# Patient Record
Sex: Male | Born: 1955 | Race: Black or African American | Hispanic: No | Smoking: Former smoker
Health system: Southern US, Community
[De-identification: ages and names within clinical notes are randomized; demographics above are authoritative.]

## PROBLEM LIST (undated history)

## (undated) DIAGNOSIS — I509 Heart failure, unspecified: Secondary | ICD-10-CM

## (undated) DIAGNOSIS — I712 Thoracic aortic aneurysm, without rupture, unspecified: Secondary | ICD-10-CM

## (undated) DIAGNOSIS — IMO0001 Reserved for inherently not codable concepts without codable children: Secondary | ICD-10-CM

## (undated) DIAGNOSIS — K5900 Constipation, unspecified: Secondary | ICD-10-CM

## (undated) DIAGNOSIS — F32A Depression, unspecified: Secondary | ICD-10-CM

## (undated) DIAGNOSIS — K219 Gastro-esophageal reflux disease without esophagitis: Secondary | ICD-10-CM

## (undated) DIAGNOSIS — F419 Anxiety disorder, unspecified: Secondary | ICD-10-CM

## (undated) DIAGNOSIS — N189 Chronic kidney disease, unspecified: Secondary | ICD-10-CM

## (undated) DIAGNOSIS — S68119A Complete traumatic metacarpophalangeal amputation of unspecified finger, initial encounter: Secondary | ICD-10-CM

## (undated) DIAGNOSIS — I5032 Chronic diastolic (congestive) heart failure: Secondary | ICD-10-CM

## (undated) DIAGNOSIS — Z9289 Personal history of other medical treatment: Secondary | ICD-10-CM

## (undated) DIAGNOSIS — I2510 Atherosclerotic heart disease of native coronary artery without angina pectoris: Secondary | ICD-10-CM

## (undated) DIAGNOSIS — Q381 Ankyloglossia: Secondary | ICD-10-CM

## (undated) DIAGNOSIS — Z8673 Personal history of transient ischemic attack (TIA), and cerebral infarction without residual deficits: Secondary | ICD-10-CM

## (undated) DIAGNOSIS — I219 Acute myocardial infarction, unspecified: Secondary | ICD-10-CM

## (undated) DIAGNOSIS — I10 Essential (primary) hypertension: Secondary | ICD-10-CM

## (undated) DIAGNOSIS — F329 Major depressive disorder, single episode, unspecified: Secondary | ICD-10-CM

## (undated) DIAGNOSIS — N4340 Spermatocele of epididymis, unspecified: Secondary | ICD-10-CM

## (undated) DIAGNOSIS — E785 Hyperlipidemia, unspecified: Secondary | ICD-10-CM

## (undated) DIAGNOSIS — Z87442 Personal history of urinary calculi: Secondary | ICD-10-CM

## (undated) DIAGNOSIS — N289 Disorder of kidney and ureter, unspecified: Secondary | ICD-10-CM

## (undated) HISTORY — DX: Thoracic aortic aneurysm, without rupture: I71.2

## (undated) HISTORY — DX: Chronic diastolic (congestive) heart failure: I50.32

## (undated) HISTORY — DX: Atherosclerotic heart disease of native coronary artery without angina pectoris: I25.10

## (undated) HISTORY — DX: Thoracic aortic aneurysm, without rupture, unspecified: I71.20

## (undated) HISTORY — PX: OTHER SURGICAL HISTORY: SHX169

## (undated) HISTORY — DX: Chronic kidney disease, unspecified: N18.9

## (undated) HISTORY — DX: Hyperlipidemia, unspecified: E78.5

## (undated) HISTORY — DX: Personal history of transient ischemic attack (TIA), and cerebral infarction without residual deficits: Z86.73

---

## 1989-07-14 HISTORY — PX: SPERMATOCELECTOMY: SHX2420

## 1998-06-10 ENCOUNTER — Ambulatory Visit (HOSPITAL_COMMUNITY): Admission: RE | Admit: 1998-06-10 | Discharge: 1998-06-10 | Payer: Self-pay | Admitting: Hematology and Oncology

## 2002-07-03 ENCOUNTER — Ambulatory Visit (HOSPITAL_BASED_OUTPATIENT_CLINIC_OR_DEPARTMENT_OTHER): Admission: RE | Admit: 2002-07-03 | Discharge: 2002-07-03 | Payer: Self-pay | Admitting: Urology

## 2011-07-15 DIAGNOSIS — I219 Acute myocardial infarction, unspecified: Secondary | ICD-10-CM | POA: Insufficient documentation

## 2011-08-08 ENCOUNTER — Emergency Department (HOSPITAL_COMMUNITY)
Admission: EM | Admit: 2011-08-08 | Discharge: 2011-08-08 | Disposition: A | Discharge status: Home / Self Care | Payer: Self-pay | Attending: Emergency Medicine | Admitting: Emergency Medicine

## 2011-08-08 ENCOUNTER — Other Ambulatory Visit: Payer: Self-pay

## 2011-08-08 ENCOUNTER — Emergency Department (HOSPITAL_COMMUNITY): Payer: Self-pay

## 2011-08-08 DIAGNOSIS — I4460 Unspecified fascicular block: Secondary | ICD-10-CM | POA: Insufficient documentation

## 2011-08-08 DIAGNOSIS — Z87442 Personal history of urinary calculi: Secondary | ICD-10-CM | POA: Insufficient documentation

## 2011-08-08 DIAGNOSIS — E876 Hypokalemia: Secondary | ICD-10-CM | POA: Insufficient documentation

## 2011-08-08 DIAGNOSIS — I110 Hypertensive heart disease with heart failure: Secondary | ICD-10-CM

## 2011-08-08 DIAGNOSIS — I10 Essential (primary) hypertension: Secondary | ICD-10-CM | POA: Insufficient documentation

## 2011-08-08 DIAGNOSIS — I509 Heart failure, unspecified: Secondary | ICD-10-CM | POA: Insufficient documentation

## 2011-08-08 DIAGNOSIS — I517 Cardiomegaly: Secondary | ICD-10-CM | POA: Insufficient documentation

## 2011-08-08 HISTORY — DX: Essential (primary) hypertension: I10

## 2011-08-08 LAB — CBC
HCT: 45.2 % (ref 39.0–52.0)
Hemoglobin: 15.6 g/dL (ref 13.0–17.0)
MCH: 30.3 pg (ref 26.0–34.0)
MCHC: 34.5 g/dL (ref 30.0–36.0)
MCV: 87.8 fL (ref 78.0–100.0)
Platelets: 315 10*3/uL (ref 150–400)
RBC: 5.15 MIL/uL (ref 4.22–5.81)
RDW: 13.5 % (ref 11.5–15.5)
WBC: 7.8 10*3/uL (ref 4.0–10.5)

## 2011-08-08 LAB — BASIC METABOLIC PANEL
BUN: 15 mg/dL (ref 6–23)
CO2: 26 mEq/L (ref 19–32)
Calcium: 8.1 mg/dL — ABNORMAL LOW (ref 8.4–10.5)
Chloride: 100 mEq/L (ref 96–112)
Creatinine, Ser: 1.91 mg/dL — ABNORMAL HIGH (ref 0.50–1.35)
GFR calc Af Amer: 44 mL/min — ABNORMAL LOW (ref >60)
GFR calc non Af Amer: 37 mL/min — ABNORMAL LOW (ref >60)
Glucose, Bld: 235 mg/dL — ABNORMAL HIGH (ref 70–99)
Potassium: 2.6 mEq/L — CL (ref 3.5–5.1)
Sodium: 138 mEq/L (ref 135–145)

## 2011-08-08 LAB — TROPONIN I: Troponin I: <0.3 ng/mL (ref <0.30)

## 2011-08-08 LAB — PRO B NATRIURETIC PEPTIDE: Pro B Natriuretic peptide (BNP): 2197 pg/mL — ABNORMAL HIGH (ref 0–125)

## 2011-08-08 MED ORDER — NITROGLYCERIN 2 % TD OINT
1.0000 [in_us] | TOPICAL_OINTMENT | Freq: Once | TRANSDERMAL | Status: AC
  Administered 2011-08-08: 66.6667 [in_us] via TOPICAL
  Filled 2011-08-08: qty 1

## 2011-08-08 MED ORDER — LORAZEPAM 2 MG/ML IJ SOLN
1.0000 mg | Freq: Once | INTRAMUSCULAR | Status: AC
  Administered 2011-08-08: 1 mg via INTRAVENOUS
  Filled 2011-08-08: qty 1

## 2011-08-08 MED ORDER — POTASSIUM CHLORIDE 20 MEQ/15ML (10%) PO LIQD
40.0000 meq | Freq: Once | ORAL | Status: AC
  Administered 2011-08-08: 40 meq via ORAL
  Filled 2011-08-08: qty 30

## 2011-08-08 MED ORDER — IPRATROPIUM BROMIDE 0.02 % IN SOLN
0.5000 mg | Freq: Once | RESPIRATORY_TRACT | Status: AC
Start: 2011-08-08 — End: 2011-08-08
  Administered 2011-08-08: 0.5 mg via RESPIRATORY_TRACT
  Filled 2011-08-08: qty 2.5

## 2011-08-08 MED ORDER — ALBUTEROL SULFATE (5 MG/ML) 0.5% IN NEBU
2.5000 mg | INHALATION_SOLUTION | Freq: Once | RESPIRATORY_TRACT | Status: AC
  Administered 2011-08-08: 2.5 mg via RESPIRATORY_TRACT
  Filled 2011-08-08: qty 1

## 2011-08-08 MED ORDER — FUROSEMIDE 10 MG/ML IJ SOLN
40.0000 mg | Freq: Once | INTRAMUSCULAR | Status: AC
  Administered 2011-08-08: 40 mg via INTRAVENOUS
  Filled 2011-08-08: qty 4

## 2011-08-08 MED ORDER — FUROSEMIDE 40 MG PO TABS: 40.0000 mg | ORAL_TABLET | Freq: Every day | ORAL | Status: DC

## 2011-08-08 NOTE — ED Notes (Signed)
CRITICAL VALUE ALERT  Critical value received:  K-2.6  Date of notification:  08/08/11  Time of notification:  0853  Critical value read back:yes  Nurse who received alert:  t abbott rn  MD notified (1st page):  [phone]  Time of first page:  0853  MD notified (2nd page):  Time of second page:  Responding MD:  edp davidson  Time MD responded: [phone]

## 2011-08-08 NOTE — ED Notes (Signed)
Pt was given a cup of water.  °

## 2011-08-08 NOTE — ED Notes (Signed)
Pt states he got into an argument with his boss. States he was upset when he became sob. Pt vomiting on arrival to ed

## 2011-08-08 NOTE — ED Provider Notes (Signed)
History   Chart scribed for Carleene Cooper III, MD by Enos Fling; the patient was seen in room APA06/APA06; this patient's care was started at 8:18 AM.    CSN: [id number] Arrival date & time: 08/08/2011  8:04 AM  Chief Complaint  Patient presents with  . Shortness of Breath    HPI Matthew Cain is a 55 y.o. male brought in by ambulance, who presents to the Emergency Department complaining of shortness of breath. Pt c/o sob, sudden in onset approx 1 hour ago when pt in argument with his boss. Pt c/o associated central chest pain, nausea, vomiting, productive cough, and throat tightness. Sx unchanged. Pt unsure if he received a breathing treatment en route. Pt h/o HTN and former smoker. Denies h/o MI or DM.   Past Medical History  Diagnosis Date  . Hypertension   Kidney stones  History reviewed. No pertinent past surgical history.  History reviewed. No pertinent family history.  History  Substance Use Topics  . Smoking status: Not on file  . Smokeless tobacco: Not on file  . Alcohol Use: No  "very little" alcohol use; former smoker   Review of Systems 10 Systems reviewed and are negative for acute change except as noted in the HPI.   Allergies  Review of patient's allergies indicates no known allergies.  Home Medications   Current Outpatient Rx  Name Route Sig Dispense Refill  . CITALOPRAM HYDROBROMIDE 20 MG PO TABS Oral Take 10 mg by mouth every morning.      [name] PAPAYA AND ENZYMES PO CHEW Oral Chew 4 tablets by mouth daily before supper.      . IBUPROFEN 200 MG PO TABS Oral Take 400 mg by mouth every 6 (six) hours as needed. pain     . SOTALOL HCL 80 MG PO TABS Oral Take 80 mg by mouth 2 (two) times daily.      . FUROSEMIDE 40 MG PO TABS Oral Take 1 tablet (40 mg total) by mouth daily. 30 tablet 0    Physical Exam    BP 151/96  Pulse 79  Temp(Src) 98.3 F (36.8 C) (Oral)  Resp 20  Ht 5\' 11"  (1.803 m)  Wt 240 lb (108.863 kg)  BMI 33.47 kg/m2  SpO2  94%  Physical Exam  Nursing note and vitals reviewed. Constitutional: He is oriented to person, place, and time. He appears well-developed and well-nourished. No distress.  HENT:  Head: Normocephalic.  Right Ear: Tympanic membrane normal.  Left Ear: Tympanic membrane normal.  Nose: Nose normal.  Mouth/Throat: Uvula is midline, oropharynx is clear and moist and mucous membranes are normal. No uvula swelling. No posterior oropharyngeal edema or posterior oropharyngeal erythema.  Eyes: Conjunctivae are normal.  Neck: Normal range of motion. Neck supple. No JVD present. No tracheal deviation present.  Cardiovascular: Normal rate, regular rhythm and intact distal pulses.  Exam reveals no gallop and no friction rub.   No murmur heard. Pulmonary/Chest: Effort normal. He has wheezes. He has no rales.  Abdominal: Soft. There is no tenderness.  Musculoskeletal: Normal range of motion.  Lymphadenopathy:    He has no cervical adenopathy.  Neurological: He is alert and oriented to person, place, and time.  Skin: Skin is warm and dry. No rash noted.  Psychiatric: He has a normal mood and affect.    ED Course  Procedures - none   OTHER DATA REVIEWED: Nursing notes and vital signs reviewed. Prior records reviewed.   LABS / RADIOLOGY: Results for orders placed  during the hospital encounter of 08/08/11  CBC      Component Value Range   WBC 7.8  4.0 - 10.5 (K/uL)   RBC 5.15  4.22 - 5.81 (MIL/uL)   Hemoglobin 15.6  13.0 - 17.0 (g/dL)   HCT 04.5  40.9 - 81.1 (%)   MCV 87.8  78.0 - 100.0 (fL)   MCH 30.3  26.0 - 34.0 (pg)   MCHC 34.5  30.0 - 36.0 (g/dL)   RDW 91.4  78.2 - 95.6 (%)   Platelets 315  150 - 400 (K/uL)  BASIC METABOLIC PANEL      Component Value Range   Sodium 138  135 - 145 (mEq/L)   Potassium 2.6 (*) 3.5 - 5.1 (mEq/L)   Chloride 100  96 - 112 (mEq/L)   CO2 26  19 - 32 (mEq/L)   Glucose, Bld 235 (*) 70 - 99 (mg/dL)   BUN 15  6 - 23 (mg/dL)   Creatinine, Ser 2.13 (*) 0.50  - 1.35 (mg/dL)   Calcium 8.1 (*) 8.4 - 10.5 (mg/dL)   GFR calc non Af Amer 37 (*) >60 (mL/min)   GFR calc Af Amer 44 (*) >60 (mL/min)  TROPONIN I      Component Value Range   Troponin I <0.30  <0.30 (ng/mL)  PRO B NATRIURETIC PEPTIDE      Component Value Range   BNP, POC 2197.0 (*) 0 - 125 (pg/mL)    Dg Chest Portable 1 View  08/08/2011  *RADIOLOGY REPORT*  Clinical Data: Shortness of breath, cough, former smoking history  PORTABLE CHEST - 1 VIEW  Comparison: None.  Findings: There is indistinctness of the pulmonary vascularity most consistent with moderate pulmonary vascular congestion. Pneumonia is difficult to exclude and follow up is recommended.  There is cardiomegaly present.  No definite effusion is seen.  No bony abnormality is noted.  IMPRESSION: Cardiomegaly and probable pulmonary vascular congestion.  Difficult to exclude an inflammatory process.  Original Report Authenticated By: Juline Patch, M.D.     ED COURSE:   Date: 08/08/2011  Rate: 95  Rhythm: normal sinus rhythm  QRS Axis: left  Intervals: normal  ST/T Wave abnormalities: nonspecific ST/T changes  Conduction Disutrbances: L anterior fascicular block. QRS:  LVH  Narrative Interpretation: Abnormal EKG with LVH.  Old EKG Reviewed: none available  9:18 AM Informed by nurse of hypokalemia, 40 mEq potassium ordered  9:50 AM Pt reevaluated, sob improved. All results reviewed and discussed with pt, questions answered, pt agreeable with plan.  10:30 AM Pt had CHF on chest x-ray.  He is breathing better now, after IV Ativan and an albuterol/atrovent nebulizer Rx.  Will give Lasix 40 mg IV and 1 inch topical nitroglycerin to treat his CHF. BNP pending.  12:45 PM Breathing much better.  Has had significant diureses.  Will order orthostatic VS, to see if pt will be able to go home.  2:35 PM  Breathing still improved. Pt comfortable with discharge.  IMPRESSION: 1. Congestive heart failure due to high blood  pressure   2. Hypertension      MEDS GIVEN IN ED: LORazepam (ATIVAN) injection 1 mg (1 mg Intravenous Given 08/08/11 0845)  albuterol (PROVENTIL) (5 MG/ML) 0.5% nebulizer solution 2.5 mg (2.5 mg Nebulization Given 08/08/11 0919)  ipratropium (ATROVENT) 0.02 % nebulizer solution 0.5 mg (0.5 mg Nebulization Given 08/08/11 0919)  potassium chloride 20 MEQ/15ML (10%) liquid 40 mEq (40 mEq Oral Given 08/08/11 0925)  furosemide (LASIX) 10 MG/ML injection 40  mg (40 mg Intravenous Given 08/08/11 1001)  nitroGLYCERIN (NITROGLYN) 2 % ointment 1 inch (16.1096 inch Topical Given 08/08/11 1000)     DISCHARGE MEDICATIONS: New Prescriptions   FUROSEMIDE (LASIX) 40 MG TABLET    Take 1 tablet (40 mg total) by mouth daily.     SCRIBE ATTESTATION: I personally performed the services described in this documentation, which was scribed in my presence. The recorded information has been reviewed and considered.  Osvaldo Human, M.D.           Carleene Cooper III, MD 08/08/11 [phone]

## 2011-09-08 ENCOUNTER — Emergency Department (HOSPITAL_COMMUNITY): Payer: Medicaid Other

## 2011-09-08 ENCOUNTER — Inpatient Hospital Stay (HOSPITAL_COMMUNITY): Payer: Medicaid Other

## 2011-09-08 ENCOUNTER — Inpatient Hospital Stay (HOSPITAL_COMMUNITY)
Admission: EM | Admit: 2011-09-08 | Discharge: 2011-09-19 | DRG: 208 | Disposition: A | Discharge status: Skilled Nursing Facility | Payer: Medicaid Other | Attending: Cardiovascular Disease | Admitting: Cardiovascular Disease

## 2011-09-08 DIAGNOSIS — J189 Pneumonia, unspecified organism: Secondary | ICD-10-CM

## 2011-09-08 DIAGNOSIS — N179 Acute kidney failure, unspecified: Secondary | ICD-10-CM | POA: Diagnosis not present

## 2011-09-08 DIAGNOSIS — R9431 Abnormal electrocardiogram [ECG] [EKG]: Secondary | ICD-10-CM | POA: Diagnosis present

## 2011-09-08 DIAGNOSIS — R5383 Other fatigue: Secondary | ICD-10-CM | POA: Diagnosis present

## 2011-09-08 DIAGNOSIS — J9600 Acute respiratory failure, unspecified whether with hypoxia or hypercapnia: Principal | ICD-10-CM | POA: Diagnosis present

## 2011-09-08 DIAGNOSIS — R5381 Other malaise: Secondary | ICD-10-CM | POA: Diagnosis present

## 2011-09-08 DIAGNOSIS — M5030 Other cervical disc degeneration, unspecified cervical region: Secondary | ICD-10-CM | POA: Diagnosis present

## 2011-09-08 DIAGNOSIS — R4701 Aphasia: Secondary | ICD-10-CM | POA: Diagnosis not present

## 2011-09-08 DIAGNOSIS — I672 Cerebral atherosclerosis: Secondary | ICD-10-CM | POA: Diagnosis present

## 2011-09-08 DIAGNOSIS — I6350 Cerebral infarction due to unspecified occlusion or stenosis of unspecified cerebral artery: Secondary | ICD-10-CM | POA: Diagnosis not present

## 2011-09-08 DIAGNOSIS — I639 Cerebral infarction, unspecified: Secondary | ICD-10-CM | POA: Diagnosis present

## 2011-09-08 DIAGNOSIS — Z7982 Long term (current) use of aspirin: Secondary | ICD-10-CM

## 2011-09-08 DIAGNOSIS — E872 Acidosis, unspecified: Secondary | ICD-10-CM | POA: Diagnosis present

## 2011-09-08 DIAGNOSIS — I517 Cardiomegaly: Secondary | ICD-10-CM

## 2011-09-08 DIAGNOSIS — I5033 Acute on chronic diastolic (congestive) heart failure: Secondary | ICD-10-CM | POA: Diagnosis present

## 2011-09-08 DIAGNOSIS — E876 Hypokalemia: Secondary | ICD-10-CM | POA: Diagnosis present

## 2011-09-08 DIAGNOSIS — N189 Chronic kidney disease, unspecified: Secondary | ICD-10-CM | POA: Diagnosis present

## 2011-09-08 DIAGNOSIS — R0602 Shortness of breath: Secondary | ICD-10-CM

## 2011-09-08 DIAGNOSIS — I129 Hypertensive chronic kidney disease with stage 1 through stage 4 chronic kidney disease, or unspecified chronic kidney disease: Secondary | ICD-10-CM | POA: Diagnosis present

## 2011-09-08 DIAGNOSIS — N289 Disorder of kidney and ureter, unspecified: Secondary | ICD-10-CM | POA: Diagnosis present

## 2011-09-08 DIAGNOSIS — E782 Mixed hyperlipidemia: Secondary | ICD-10-CM

## 2011-09-08 DIAGNOSIS — I2510 Atherosclerotic heart disease of native coronary artery without angina pectoris: Secondary | ICD-10-CM | POA: Diagnosis present

## 2011-09-08 DIAGNOSIS — I10 Essential (primary) hypertension: Secondary | ICD-10-CM | POA: Diagnosis present

## 2011-09-08 DIAGNOSIS — E785 Hyperlipidemia, unspecified: Secondary | ICD-10-CM | POA: Diagnosis present

## 2011-09-08 DIAGNOSIS — E119 Type 2 diabetes mellitus without complications: Secondary | ICD-10-CM | POA: Diagnosis present

## 2011-09-08 LAB — CARDIAC PANEL(CRET KIN+CKTOT+MB+TROPI)
CK, MB: 5.1 ng/mL — ABNORMAL HIGH (ref 0.3–4.0)
Relative Index: 2.1 (ref 0.0–2.5)
Total CK: 246 U/L — ABNORMAL HIGH (ref 7–232)
Troponin I: 0.37 ng/mL (ref <0.30)

## 2011-09-08 LAB — POCT I-STAT 3, ART BLOOD GAS (G3+)
Acid-Base Excess: 2 mmol/L (ref 0.0–2.0)
Bicarbonate: 29.5 mEq/L — ABNORMAL HIGH (ref 20.0–24.0)
O2 Saturation: 98 %
TCO2: 31 mmol/L (ref 0–100)
pCO2 arterial: 55.2 mmHg — ABNORMAL HIGH (ref 35.0–45.0)
pH, Arterial: 7.335 — ABNORMAL LOW (ref 7.350–7.450)
pO2, Arterial: 115 mmHg — ABNORMAL HIGH (ref 80.0–100.0)

## 2011-09-08 LAB — GLUCOSE, CAPILLARY
Glucose-Capillary: 182 mg/dL — ABNORMAL HIGH (ref 70–99)
Glucose-Capillary: 171 mg/dL — ABNORMAL HIGH (ref 70–99)

## 2011-09-08 LAB — MRSA PCR SCREENING: MRSA by PCR: NEGATIVE

## 2011-09-08 MED ORDER — IOHEXOL 350 MG/ML SOLN
50.0000 mL | Freq: Once | INTRAVENOUS | Status: AC | PRN
  Administered 2011-09-08: 50 mL via INTRAVENOUS

## 2011-09-09 ENCOUNTER — Inpatient Hospital Stay (HOSPITAL_COMMUNITY): Payer: Medicaid Other

## 2011-09-09 DIAGNOSIS — I5033 Acute on chronic diastolic (congestive) heart failure: Secondary | ICD-10-CM

## 2011-09-09 DIAGNOSIS — J9600 Acute respiratory failure, unspecified whether with hypoxia or hypercapnia: Secondary | ICD-10-CM

## 2011-09-09 DIAGNOSIS — Z9911 Dependence on respirator [ventilator] status: Secondary | ICD-10-CM

## 2011-09-09 DIAGNOSIS — I5031 Acute diastolic (congestive) heart failure: Secondary | ICD-10-CM

## 2011-09-09 HISTORY — PX: CARDIAC CATHETERIZATION: SHX172

## 2011-09-09 LAB — TSH: TSH: 1.117 u[IU]/mL (ref 0.350–4.500)

## 2011-09-09 LAB — LIPID PANEL
Cholesterol: 214 mg/dL — ABNORMAL HIGH (ref 0–200)
HDL: 45 mg/dL (ref >39)
LDL Cholesterol: 125 mg/dL — ABNORMAL HIGH (ref 0–99)
Total CHOL/HDL Ratio: 4.8 RATIO
Triglycerides: 222 mg/dL — ABNORMAL HIGH (ref <150)
VLDL: 44 mg/dL — ABNORMAL HIGH (ref 0–40)

## 2011-09-09 LAB — BASIC METABOLIC PANEL
BUN: 22 mg/dL (ref 6–23)
BUN: 24 mg/dL — ABNORMAL HIGH (ref 6–23)
CO2: 28 mEq/L (ref 19–32)
CO2: 29 mEq/L (ref 19–32)
Calcium: 8.1 mg/dL — ABNORMAL LOW (ref 8.4–10.5)
Calcium: 8.1 mg/dL — ABNORMAL LOW (ref 8.4–10.5)
Chloride: 101 mEq/L (ref 96–112)
Chloride: 103 mEq/L (ref 96–112)
Creatinine, Ser: 1.98 mg/dL — ABNORMAL HIGH (ref 0.50–1.35)
Creatinine, Ser: 1.86 mg/dL — ABNORMAL HIGH (ref 0.50–1.35)
GFR calc Af Amer: 42 mL/min — ABNORMAL LOW (ref >90)
GFR calc Af Amer: 45 mL/min — ABNORMAL LOW (ref >90)
GFR calc non Af Amer: 36 mL/min — ABNORMAL LOW (ref >90)
GFR calc non Af Amer: 39 mL/min — ABNORMAL LOW (ref >90)
Glucose, Bld: 98 mg/dL (ref 70–99)
Glucose, Bld: 126 mg/dL — ABNORMAL HIGH (ref 70–99)
Potassium: 2.7 mEq/L — CL (ref 3.5–5.1)
Potassium: 3.4 mEq/L — ABNORMAL LOW (ref 3.5–5.1)
Sodium: 142 mEq/L (ref 135–145)
Sodium: 141 mEq/L (ref 135–145)

## 2011-09-09 LAB — BLOOD GAS, ARTERIAL
Acid-Base Excess: 5.7 mmol/L — ABNORMAL HIGH (ref 0.0–2.0)
Bicarbonate: 29.4 mEq/L — ABNORMAL HIGH (ref 20.0–24.0)
Drawn by: 318431
FIO2: 0.4 %
MECHVT: 600 mL
O2 Saturation: 95 %
PEEP: 5 cmH2O
Patient temperature: 98.6
RATE: 14 resp/min
TCO2: 30.6 mmol/L (ref 0–100)
pCO2 arterial: 40.8 mmHg (ref 35.0–45.0)
pH, Arterial: 7.471 — ABNORMAL HIGH (ref 7.350–7.450)
pO2, Arterial: 68.5 mmHg — ABNORMAL LOW (ref 80.0–100.0)

## 2011-09-09 LAB — HEMOGLOBIN A1C
Hgb A1c MFr Bld: 7.3 % — ABNORMAL HIGH (ref <5.7)
Mean Plasma Glucose: 163 mg/dL — ABNORMAL HIGH (ref <117)

## 2011-09-09 LAB — CARDIAC PANEL(CRET KIN+CKTOT+MB+TROPI)
CK, MB: 4.8 ng/mL — ABNORMAL HIGH (ref 0.3–4.0)
CK, MB: 3.7 ng/mL (ref 0.3–4.0)
Relative Index: 2.6 — ABNORMAL HIGH (ref 0.0–2.5)
Relative Index: 2.2 (ref 0.0–2.5)
Total CK: 183 U/L (ref 7–232)
Total CK: 169 U/L (ref 7–232)
Troponin I: 0.35 ng/mL (ref <0.30)
Troponin I: <0.3 ng/mL (ref <0.30)

## 2011-09-09 LAB — GLUCOSE, CAPILLARY
Glucose-Capillary: 113 mg/dL — ABNORMAL HIGH (ref 70–99)
Glucose-Capillary: 120 mg/dL — ABNORMAL HIGH (ref 70–99)
Glucose-Capillary: 134 mg/dL — ABNORMAL HIGH (ref 70–99)
Glucose-Capillary: 158 mg/dL — ABNORMAL HIGH (ref 70–99)
Glucose-Capillary: 201 mg/dL — ABNORMAL HIGH (ref 70–99)

## 2011-09-09 LAB — CBC
HCT: 42.6 % (ref 39.0–52.0)
Hemoglobin: 14.7 g/dL (ref 13.0–17.0)
MCH: 30.1 pg (ref 26.0–34.0)
MCHC: 34.5 g/dL (ref 30.0–36.0)
MCV: 87.1 fL (ref 78.0–100.0)
Platelets: 242 10*3/uL (ref 150–400)
RBC: 4.89 MIL/uL (ref 4.22–5.81)
RDW: 13.5 % (ref 11.5–15.5)
WBC: 9.7 10*3/uL (ref 4.0–10.5)

## 2011-09-09 LAB — MAGNESIUM: Magnesium: 1.9 mg/dL (ref 1.5–2.5)

## 2011-09-10 ENCOUNTER — Other Ambulatory Visit (HOSPITAL_COMMUNITY): Payer: Self-pay

## 2011-09-10 ENCOUNTER — Inpatient Hospital Stay (HOSPITAL_COMMUNITY): Payer: Medicaid Other

## 2011-09-10 DIAGNOSIS — I10 Essential (primary) hypertension: Secondary | ICD-10-CM

## 2011-09-10 LAB — URINALYSIS, ROUTINE W REFLEX MICROSCOPIC
Bilirubin Urine: NEGATIVE
Glucose, UA: NEGATIVE mg/dL
Ketones, ur: NEGATIVE mg/dL
Leukocytes, UA: NEGATIVE
Nitrite: NEGATIVE
Protein, ur: NEGATIVE mg/dL
Specific Gravity, Urine: 1.008 (ref 1.005–1.030)
Urobilinogen, UA: 0.2 mg/dL (ref 0.0–1.0)
pH: 7 (ref 5.0–8.0)

## 2011-09-10 LAB — BASIC METABOLIC PANEL
BUN: 20 mg/dL (ref 6–23)
BUN: 17 mg/dL (ref 6–23)
CO2: 27 mEq/L (ref 19–32)
CO2: 28 mEq/L (ref 19–32)
Calcium: 8.3 mg/dL — ABNORMAL LOW (ref 8.4–10.5)
Calcium: 8.7 mg/dL (ref 8.4–10.5)
Chloride: 102 mEq/L (ref 96–112)
Chloride: 104 mEq/L (ref 96–112)
Creatinine, Ser: 1.52 mg/dL — ABNORMAL HIGH (ref 0.50–1.35)
Creatinine, Ser: 1.55 mg/dL — ABNORMAL HIGH (ref 0.50–1.35)
GFR calc Af Amer: 58 mL/min — ABNORMAL LOW (ref >90)
GFR calc Af Amer: 57 mL/min — ABNORMAL LOW (ref >90)
GFR calc non Af Amer: 50 mL/min — ABNORMAL LOW (ref >90)
GFR calc non Af Amer: 49 mL/min — ABNORMAL LOW (ref >90)
Glucose, Bld: 194 mg/dL — ABNORMAL HIGH (ref 70–99)
Glucose, Bld: 159 mg/dL — ABNORMAL HIGH (ref 70–99)
Potassium: 2.6 mEq/L — CL (ref 3.5–5.1)
Potassium: 3.5 mEq/L (ref 3.5–5.1)
Sodium: 138 mEq/L (ref 135–145)
Sodium: 140 mEq/L (ref 135–145)

## 2011-09-10 LAB — DRUGS OF ABUSE SCREEN W/O ALC, ROUTINE URINE
Amphetamine Screen, Ur: NEGATIVE
Barbiturate Quant, Ur: NEGATIVE
Benzodiazepines.: POSITIVE — AB
Cocaine Metabolites: NEGATIVE
Creatinine,U: 66.8 mg/dL
Marijuana Metabolite: NEGATIVE
Methadone: NEGATIVE
Opiate Screen, Urine: NEGATIVE
Phencyclidine (PCP): NEGATIVE
Propoxyphene: NEGATIVE

## 2011-09-10 LAB — CBC
HCT: 39.1 % (ref 39.0–52.0)
Hemoglobin: 13.6 g/dL (ref 13.0–17.0)
MCH: 30.6 pg (ref 26.0–34.0)
MCHC: 34.8 g/dL (ref 30.0–36.0)
MCV: 87.9 fL (ref 78.0–100.0)
Platelets: 217 10*3/uL (ref 150–400)
RBC: 4.45 MIL/uL (ref 4.22–5.81)
RDW: 13.5 % (ref 11.5–15.5)
WBC: 8.4 10*3/uL (ref 4.0–10.5)

## 2011-09-10 LAB — GLUCOSE, CAPILLARY
Glucose-Capillary: 134 mg/dL — ABNORMAL HIGH (ref 70–99)
Glucose-Capillary: 144 mg/dL — ABNORMAL HIGH (ref 70–99)
Glucose-Capillary: 110 mg/dL — ABNORMAL HIGH (ref 70–99)
Glucose-Capillary: 181 mg/dL — ABNORMAL HIGH (ref 70–99)

## 2011-09-10 LAB — MAGNESIUM: Magnesium: 2 mg/dL (ref 1.5–2.5)

## 2011-09-10 LAB — URINE MICROSCOPIC-ADD ON

## 2011-09-11 LAB — CULTURE, RESPIRATORY W GRAM STAIN

## 2011-09-11 LAB — GLUCOSE, CAPILLARY
Glucose-Capillary: 135 mg/dL — ABNORMAL HIGH (ref 70–99)
Glucose-Capillary: 124 mg/dL — ABNORMAL HIGH (ref 70–99)
Glucose-Capillary: 136 mg/dL — ABNORMAL HIGH (ref 70–99)
Glucose-Capillary: 132 mg/dL — ABNORMAL HIGH (ref 70–99)

## 2011-09-11 LAB — CULTURE, RESPIRATORY

## 2011-09-11 LAB — BASIC METABOLIC PANEL
BUN: 14 mg/dL (ref 6–23)
CO2: 24 mEq/L (ref 19–32)
Calcium: 8.8 mg/dL (ref 8.4–10.5)
Chloride: 104 mEq/L (ref 96–112)
Creatinine, Ser: 1.3 mg/dL (ref 0.50–1.35)
GFR calc Af Amer: 70 mL/min — ABNORMAL LOW (ref >90)
GFR calc non Af Amer: 60 mL/min — ABNORMAL LOW (ref >90)
Glucose, Bld: 142 mg/dL — ABNORMAL HIGH (ref 70–99)
Potassium: 3.7 mEq/L (ref 3.5–5.1)
Sodium: 137 mEq/L (ref 135–145)

## 2011-09-11 LAB — CREATININE, URINE, 24 HOUR
Collection Interval-UCRE24: 24 hours
Creatinine, 24H Ur: 1546 mg/d (ref 800–2000)
Creatinine, Urine: 118.95 mg/dL
Urine Total Volume-UCRE24: 1300 mL

## 2011-09-11 LAB — MAGNESIUM: Magnesium: 2 mg/dL (ref 1.5–2.5)

## 2011-09-11 NOTE — Cardiovascular Report (Signed)
Matthew Cain, Matthew Cain                   ACCOUNT NO.:  [id number]  MEDICAL RECORD NO.:  [id number]  LOCATION:  2903                         FACILITY:  [facility]  PHYSICIAN:  Verne Carrow, MDDATE OF BIRTH:  [birth year]  DATE OF PROCEDURE:  09/08/2011 DATE OF DISCHARGE:                           CARDIAC CATHETERIZATION   PRIMARY CARDIOLOGIST:  Learta Codding, MD, [facility]  PROCEDURES PERFORMED: 1. Left heart catheterization. 2. Selective coronary angiography.  OPERATOR:  Verne Carrow, MD  INDICATION:  This is a 55 year old African American male with a past medical history significant for hypertension, chronic renal insufficiency and reported congestive heart failure in the chart who presented to the [facility] Emergency Department via EMS earlier this afternoon with complaints of shortness of breath.  The patient was found to be in pulmonary edema and was quickly intubated secondary to respiratory distress.  The patient was evaluated by Dr. Lewayne Bunting in the Emergency Department at [facility] in [place], [place]. A bedside echocardiogram demonstrated hypokinesis of the anterior [place]. The patient's EKG shows chronic changes of left ventricular hypertrophy with possibly subtle changes in the precordial leads.  The patient also had reported possible weakness on the left side prior to intubation. There was also reported the patient had unequal pulses in his arms.  A Code STEMI was called and the patient was transported emergently to [facility].  Upon arrival, we elected to take the patient through our CT scanner in the emergency department to rule out an intracranial hemorrhage given his possible neurological changes at [facility].  The CT of the head showed no acute bleeding process intracranially.  We also used a low contrast load to perform a quick angiogram via CT of the aortic arch.  There was no evidence of aortic dissection.  At this point, we rapidly  transported the patient to the Cath Lab where an emergent left heart catheterizations was planned.  DETAILS OF PROCEDURE:  The patient was brought into the Cardiac Catheterizations Laboratory.  Emergency consent was obtained.  The patient was quickly moved to the Cath Table in a supine position.  The right groin was prepped and draped in sterile fashion.  Lidocaine 1% was used for local anesthesia.  A 6-French sheath was inserted into the right femoral artery without difficulty.  Standard diagnostic catheters were used to perform selective coronary angiography.  We elected not to perform the left ventricular angiogram as the patient does have chronic renal insufficiency.  I do not feel that an aortic angiogram was indicated as we had performed this via CT angiogram before he came to the Cath Lab.  The patient was intubated during the procedure and hemodynamically stable.  At the conclusion of the case, there were no immediate complications.  The patient remained sedated and intubated. The patient was taken to the CCU in stable condition.  HEMODYNAMIC FINDINGS:  Central aortic pressure 122/76.  Left ventricular pressure 119/5/13.  ANGIOGRAPHIC FINDINGS: 1. The left main coronary artery had no evidence of disease. 2. Left anterior descending was a large vessel that coursed to the     apex and had mild plaque disease in the proximal and midportion.  There were 2 moderate-sized diagonal branches that had mild plaque     disease. 3. The circumflex artery is a large dominant vessel.  It gives off 2     obtuse marginal branches, they are free of disease.  The circumflex     system has mild plaque disease in the midportion.  The left     posterolateral branch has mild plaque disease. 4. The right coronary artery is a small nondominant vessel with no     evidence of disease. 5. No left ventricular angiogram was performed.  IMPRESSION: 1. Mild nonobstructive coronary artery disease. 2.  Acute respiratory failure requiring mechanical ventilation presumed     to be secondary to pulmonary edema. 3. Questionable hypertensive heart disease based on the patient's EKG.  RECOMMENDATIONS:  The patient will be transferred to the CCU.  We will obtain a stat echocardiogram to assess his left ventricular size and function.  We will place a consultation to Pulmonary and Critical Care Medicine for assistance in ventilator management.  We will continue with gentle diuresis now following the cath and then diuresis over the next several days.  We will obtain a portable chest x-ray to exclude interstitial lung disease and pneumonia.     Verne Carrow, MD     CM/MEDQ  D:  09/08/2011  T:  09/09/2011  Job:  [id number]  cc:   Learta Codding, MD,FACC  Electronically Signed by Verne Carrow MD on 09/11/2011 08:30:57 AM

## 2011-09-12 LAB — BASIC METABOLIC PANEL
BUN: 14 mg/dL (ref 6–23)
CO2: 25 mEq/L (ref 19–32)
Calcium: 9 mg/dL (ref 8.4–10.5)
Chloride: 103 mEq/L (ref 96–112)
Creatinine, Ser: 1.23 mg/dL (ref 0.50–1.35)
GFR calc Af Amer: 75 mL/min — ABNORMAL LOW (ref >90)
GFR calc non Af Amer: 64 mL/min — ABNORMAL LOW (ref >90)
Glucose, Bld: 96 mg/dL (ref 70–99)
Potassium: 3.3 mEq/L — ABNORMAL LOW (ref 3.5–5.1)
Sodium: 138 mEq/L (ref 135–145)

## 2011-09-12 LAB — GLUCOSE, CAPILLARY
Glucose-Capillary: 120 mg/dL — ABNORMAL HIGH (ref 70–99)
Glucose-Capillary: 96 mg/dL (ref 70–99)
Glucose-Capillary: 124 mg/dL — ABNORMAL HIGH (ref 70–99)
Glucose-Capillary: 144 mg/dL — ABNORMAL HIGH (ref 70–99)
Glucose-Capillary: 97 mg/dL (ref 70–99)

## 2011-09-12 LAB — LIPID PANEL
Cholesterol: 231 mg/dL — ABNORMAL HIGH (ref 0–200)
HDL: 47 mg/dL (ref >39)
LDL Cholesterol: 129 mg/dL — ABNORMAL HIGH (ref 0–99)
Total CHOL/HDL Ratio: 4.9 RATIO
Triglycerides: 276 mg/dL — ABNORMAL HIGH (ref <150)
VLDL: 55 mg/dL — ABNORMAL HIGH (ref 0–40)

## 2011-09-12 LAB — HEMOGLOBIN A1C
Hgb A1c MFr Bld: 7.1 % — ABNORMAL HIGH (ref <5.7)
Mean Plasma Glucose: 157 mg/dL — ABNORMAL HIGH (ref <117)

## 2011-09-13 ENCOUNTER — Inpatient Hospital Stay (HOSPITAL_COMMUNITY): Payer: Medicaid Other

## 2011-09-13 DIAGNOSIS — I6350 Cerebral infarction due to unspecified occlusion or stenosis of unspecified cerebral artery: Secondary | ICD-10-CM

## 2011-09-13 LAB — BENZODIAZEPINE, QUANTITATIVE, URINE
Alprazolam (GC/LC/MS), ur confirm: NEGATIVE ng/mL
Flurazepam GC/MS Conf: NEGATIVE ng/mL
Lorazepam UR QT: NEGATIVE ng/mL
Nordiazepam GC/MS Conf: NEGATIVE ng/mL
Oxazepam GC/MS Conf: NEGATIVE ng/mL
Temazepam GC/MS Conf: NEGATIVE ng/mL

## 2011-09-13 LAB — BASIC METABOLIC PANEL WITH GFR
BUN: 16 mg/dL (ref 6–23)
CO2: 24 meq/L (ref 19–32)
Calcium: 9 mg/dL (ref 8.4–10.5)
Chloride: 105 meq/L (ref 96–112)
Creatinine, Ser: 1.29 mg/dL (ref 0.50–1.35)
GFR calc Af Amer: 71 mL/min — ABNORMAL LOW (ref >90)
GFR calc non Af Amer: 61 mL/min — ABNORMAL LOW (ref >90)
Glucose, Bld: 121 mg/dL — ABNORMAL HIGH (ref 70–99)
Potassium: 3.8 meq/L (ref 3.5–5.1)
Sodium: 138 meq/L (ref 135–145)

## 2011-09-13 LAB — GLUCOSE, CAPILLARY
Glucose-Capillary: 128 mg/dL — ABNORMAL HIGH (ref 70–99)
Glucose-Capillary: 163 mg/dL — ABNORMAL HIGH (ref 70–99)
Glucose-Capillary: 111 mg/dL — ABNORMAL HIGH (ref 70–99)
Glucose-Capillary: 130 mg/dL — ABNORMAL HIGH (ref 70–99)

## 2011-09-13 LAB — MAGNESIUM: Magnesium: 2 mg/dL (ref 1.5–2.5)

## 2011-09-14 DIAGNOSIS — I10 Essential (primary) hypertension: Secondary | ICD-10-CM

## 2011-09-14 DIAGNOSIS — G8110 Spastic hemiplegia affecting unspecified side: Secondary | ICD-10-CM

## 2011-09-14 DIAGNOSIS — I6330 Cerebral infarction due to thrombosis of unspecified cerebral artery: Secondary | ICD-10-CM

## 2011-09-14 DIAGNOSIS — I6789 Other cerebrovascular disease: Secondary | ICD-10-CM

## 2011-09-14 LAB — CBC
HCT: 37.7 % — ABNORMAL LOW (ref 39.0–52.0)
Hemoglobin: 12.2 g/dL — ABNORMAL LOW (ref 13.0–17.0)
MCH: 29 pg (ref 26.0–34.0)
MCHC: 32.4 g/dL (ref 30.0–36.0)
MCV: 89.8 fL (ref 78.0–100.0)
Platelets: 233 10*3/uL (ref 150–400)
RBC: 4.2 MIL/uL — ABNORMAL LOW (ref 4.22–5.81)
RDW: 14.2 % (ref 11.5–15.5)
WBC: 5.6 10*3/uL (ref 4.0–10.5)

## 2011-09-14 LAB — CATECHOLAMINES, FRACTIONATED, URINE, 24 HOUR
Catecholamines T: 79 mcg/24 h (ref 26–121)
Creatinine, Urine mg/day-CATEUR: 1.67 g/(24.h) (ref 0.63–2.50)
Dopamine 24 Hr Urine: 154 mcg/24 h (ref 52–480)
Epinephrine 24 Hr Urine: 8 mcg/24 h (ref 2–24)
Norepinephrine 24 Hr Urine: 71 mcg/24 h (ref 15–100)
Total urine volume: 1300 mL

## 2011-09-14 LAB — BASIC METABOLIC PANEL
BUN: 18 mg/dL (ref 6–23)
CO2: 24 mEq/L (ref 19–32)
Calcium: 9.1 mg/dL (ref 8.4–10.5)
Chloride: 105 mEq/L (ref 96–112)
Creatinine, Ser: 1.32 mg/dL (ref 0.50–1.35)
GFR calc Af Amer: 69 mL/min — ABNORMAL LOW (ref >90)
GFR calc non Af Amer: 59 mL/min — ABNORMAL LOW (ref >90)
Glucose, Bld: 117 mg/dL — ABNORMAL HIGH (ref 70–99)
Potassium: 4.3 mEq/L (ref 3.5–5.1)
Sodium: 138 mEq/L (ref 135–145)

## 2011-09-14 LAB — GLUCOSE, CAPILLARY
Glucose-Capillary: 114 mg/dL — ABNORMAL HIGH (ref 70–99)
Glucose-Capillary: 73 mg/dL (ref 70–99)
Glucose-Capillary: 95 mg/dL (ref 70–99)
Glucose-Capillary: 99 mg/dL (ref 70–99)

## 2011-09-15 LAB — GLUCOSE, CAPILLARY
Glucose-Capillary: 112 mg/dL — ABNORMAL HIGH (ref 70–99)
Glucose-Capillary: 95 mg/dL (ref 70–99)
Glucose-Capillary: 107 mg/dL — ABNORMAL HIGH (ref 70–99)
Glucose-Capillary: 115 mg/dL — ABNORMAL HIGH (ref 70–99)

## 2011-09-15 LAB — BASIC METABOLIC PANEL
BUN: 19 mg/dL (ref 6–23)
CO2: 24 mEq/L (ref 19–32)
Calcium: 9.2 mg/dL (ref 8.4–10.5)
Chloride: 101 mEq/L (ref 96–112)
Creatinine, Ser: 1.34 mg/dL (ref 0.50–1.35)
GFR calc Af Amer: 67 mL/min — ABNORMAL LOW (ref >90)
GFR calc non Af Amer: 58 mL/min — ABNORMAL LOW (ref >90)
Glucose, Bld: 112 mg/dL — ABNORMAL HIGH (ref 70–99)
Potassium: 4.2 mEq/L (ref 3.5–5.1)
Sodium: 137 mEq/L (ref 135–145)

## 2011-09-15 LAB — CULTURE, BLOOD (ROUTINE X 2)
Culture  Setup Time: 201210270126
Culture  Setup Time: 201210270126
Culture: NO GROWTH
Culture: NO GROWTH

## 2011-09-15 LAB — METANEPHRINES, URINE, 24 HOUR
Metaneph Total, Ur: 597 mcg/24 h (ref 224–832)
Metanephrines, Ur: 148 mcg/24 h (ref 90–315)
Normetanephrine, 24H Ur: 449 mcg/24 h (ref 122–676)
Volume, Urine-METAN: 1300 mL

## 2011-09-15 LAB — PRO B NATRIURETIC PEPTIDE: Pro B Natriuretic peptide (BNP): 505.6 pg/mL — ABNORMAL HIGH (ref 0–125)

## 2011-09-16 LAB — BASIC METABOLIC PANEL
BUN: 19 mg/dL (ref 6–23)
CO2: 23 mEq/L (ref 19–32)
Calcium: 8.6 mg/dL (ref 8.4–10.5)
Chloride: 101 mEq/L (ref 96–112)
Creatinine, Ser: 1.36 mg/dL — ABNORMAL HIGH (ref 0.50–1.35)
GFR calc Af Amer: 66 mL/min — ABNORMAL LOW (ref >90)
GFR calc non Af Amer: 57 mL/min — ABNORMAL LOW (ref >90)
Glucose, Bld: 217 mg/dL — ABNORMAL HIGH (ref 70–99)
Potassium: 3.7 mEq/L (ref 3.5–5.1)
Sodium: 136 mEq/L (ref 135–145)

## 2011-09-16 LAB — GLUCOSE, CAPILLARY
Glucose-Capillary: 228 mg/dL — ABNORMAL HIGH (ref 70–99)
Glucose-Capillary: 75 mg/dL (ref 70–99)
Glucose-Capillary: 101 mg/dL — ABNORMAL HIGH (ref 70–99)
Glucose-Capillary: 97 mg/dL (ref 70–99)

## 2011-09-16 MED ORDER — HYDRALAZINE HCL 20 MG/ML IJ SOLN
20.0000 mg | INTRAMUSCULAR | Status: DC | PRN
  Filled 2011-09-16: qty 1

## 2011-09-16 MED ORDER — INSULIN ASPART 100 UNIT/ML ~~LOC~~ SOLN
3.0000 [IU] | Freq: Three times a day (TID) | SUBCUTANEOUS | Status: DC
  Administered 2011-09-18: 3 [IU] via SUBCUTANEOUS
  Filled 2011-09-16: qty 3

## 2011-09-16 MED ORDER — PANTOPRAZOLE SODIUM 40 MG PO TBEC
40.0000 mg | DELAYED_RELEASE_TABLET | Freq: Every day | ORAL | Status: DC
  Administered 2011-09-17 – 2011-09-19 (×3): 40 mg via ORAL

## 2011-09-16 MED ORDER — ACETAMINOPHEN 325 MG PO TABS: 650.0000 mg | ORAL_TABLET | Freq: Four times a day (QID) | ORAL | Status: DC | PRN

## 2011-09-16 MED ORDER — POTASSIUM CHLORIDE CRYS ER 20 MEQ PO TBCR: 20.0000 meq | EXTENDED_RELEASE_TABLET | Freq: Every day | ORAL | Status: DC

## 2011-09-16 MED ORDER — LABETALOL HCL 300 MG PO TABS
800.0000 mg | ORAL_TABLET | Freq: Two times a day (BID) | ORAL | Status: DC
  Administered 2011-09-16 – 2011-09-19 (×6): 800 mg via ORAL
  Filled 2011-09-16 (×8): qty 1

## 2011-09-16 MED ORDER — FUROSEMIDE 20 MG PO TABS
20.0000 mg | ORAL_TABLET | Freq: Every day | ORAL | Status: DC
  Administered 2011-09-17 – 2011-09-19 (×3): 20 mg via ORAL
  Filled 2011-09-16 (×4): qty 1

## 2011-09-16 MED ORDER — AMLODIPINE BESYLATE 10 MG PO TABS
10.0000 mg | ORAL_TABLET | Freq: Every day | ORAL | Status: DC
  Administered 2011-09-17 – 2011-09-19 (×3): 10 mg via ORAL
  Filled 2011-09-16 (×4): qty 1

## 2011-09-16 MED ORDER — INSULIN ASPART 100 UNIT/ML ~~LOC~~ SOLN
0.0000 [IU] | Freq: Every day | SUBCUTANEOUS | Status: DC
  Filled 2011-09-16: qty 3

## 2011-09-16 MED ORDER — INSULIN ASPART 100 UNIT/ML ~~LOC~~ SOLN
0.0000 [IU] | Freq: Three times a day (TID) | SUBCUTANEOUS | Status: DC
  Administered 2011-09-18: 2 [IU] via SUBCUTANEOUS
  Filled 2011-09-16: qty 3

## 2011-09-16 MED ORDER — NITROGLYCERIN IN D5W 200-5 MCG/ML-% IV SOLN: 2.0000 ug/min | INTRAVENOUS | Status: DC

## 2011-09-16 MED ORDER — TRAMADOL HCL 50 MG PO TABS: 50.0000 mg | ORAL_TABLET | Freq: Four times a day (QID) | ORAL | Status: DC | PRN

## 2011-09-16 MED ORDER — POTASSIUM CHLORIDE CRYS ER 20 MEQ PO TBCR
20.0000 meq | EXTENDED_RELEASE_TABLET | Freq: Every day | ORAL | Status: DC
  Administered 2011-09-17: 20 meq via ORAL

## 2011-09-16 MED ORDER — ROSUVASTATIN CALCIUM 5 MG PO TABS
5.0000 mg | ORAL_TABLET | Freq: Every day | ORAL | Status: DC
  Administered 2011-09-17 – 2011-09-18 (×2): 5 mg via ORAL
  Filled 2011-09-16 (×4): qty 1

## 2011-09-16 MED ORDER — POTASSIUM CHLORIDE CRYS ER 20 MEQ PO TBCR: 40.0000 meq | EXTENDED_RELEASE_TABLET | Freq: Every day | ORAL | Status: DC

## 2011-09-16 MED ORDER — CITALOPRAM HYDROBROMIDE 20 MG PO TABS
20.0000 mg | ORAL_TABLET | Freq: Every day | ORAL | Status: DC
  Administered 2011-09-17 – 2011-09-19 (×3): 20 mg via ORAL
  Filled 2011-09-16 (×4): qty 1

## 2011-09-16 MED ORDER — SPIRONOLACTONE 25 MG PO TABS
25.0000 mg | ORAL_TABLET | Freq: Every day | ORAL | Status: DC
  Administered 2011-09-17 – 2011-09-19 (×3): 25 mg via ORAL
  Filled 2011-09-16 (×4): qty 1

## 2011-09-17 DIAGNOSIS — N289 Disorder of kidney and ureter, unspecified: Secondary | ICD-10-CM | POA: Diagnosis present

## 2011-09-17 DIAGNOSIS — I639 Cerebral infarction, unspecified: Secondary | ICD-10-CM | POA: Diagnosis present

## 2011-09-17 DIAGNOSIS — I10 Essential (primary) hypertension: Secondary | ICD-10-CM | POA: Diagnosis present

## 2011-09-17 DIAGNOSIS — I2510 Atherosclerotic heart disease of native coronary artery without angina pectoris: Secondary | ICD-10-CM | POA: Diagnosis present

## 2011-09-17 DIAGNOSIS — E785 Hyperlipidemia, unspecified: Secondary | ICD-10-CM | POA: Diagnosis present

## 2011-09-17 DIAGNOSIS — I6350 Cerebral infarction due to unspecified occlusion or stenosis of unspecified cerebral artery: Secondary | ICD-10-CM | POA: Insufficient documentation

## 2011-09-17 DIAGNOSIS — N189 Chronic kidney disease, unspecified: Secondary | ICD-10-CM | POA: Diagnosis present

## 2011-09-17 DIAGNOSIS — J189 Pneumonia, unspecified organism: Secondary | ICD-10-CM | POA: Diagnosis present

## 2011-09-17 DIAGNOSIS — E119 Type 2 diabetes mellitus without complications: Secondary | ICD-10-CM | POA: Diagnosis present

## 2011-09-17 LAB — GLUCOSE, CAPILLARY
Glucose-Capillary: 98 mg/dL (ref 70–99)
Glucose-Capillary: 110 mg/dL — ABNORMAL HIGH (ref 70–99)
Glucose-Capillary: 99 mg/dL (ref 70–99)
Glucose-Capillary: 83 mg/dL (ref 70–99)

## 2011-09-17 LAB — BASIC METABOLIC PANEL
BUN: 16 mg/dL (ref 6–23)
CO2: 26 mEq/L (ref 19–32)
Calcium: 9.4 mg/dL (ref 8.4–10.5)
Chloride: 101 mEq/L (ref 96–112)
Creatinine, Ser: 1.41 mg/dL — ABNORMAL HIGH (ref 0.50–1.35)
GFR calc Af Amer: 63 mL/min — ABNORMAL LOW (ref >90)
GFR calc non Af Amer: 55 mL/min — ABNORMAL LOW (ref >90)
Glucose, Bld: 97 mg/dL (ref 70–99)
Potassium: 4 mEq/L (ref 3.5–5.1)
Sodium: 137 mEq/L (ref 135–145)

## 2011-09-17 LAB — 5 HIAA, QUANTITATIVE, URINE, 24 HOUR
5-HIAA, 24 Hr Urine: 5.5 mg/24 h (ref <=6.0)
Volume, Urine-5HIAA: 1300 mL/24 h

## 2011-09-17 MED ORDER — FUROSEMIDE 10 MG/ML IJ SOLN
20.0000 mg | Freq: Once | INTRAMUSCULAR | Status: AC
  Administered 2011-09-17: 20 mg via INTRAVENOUS
  Filled 2011-09-17: qty 2

## 2011-09-17 MED ORDER — LISINOPRIL 2.5 MG PO TABS
2.5000 mg | ORAL_TABLET | Freq: Every day | ORAL | Status: DC
  Administered 2011-09-17: 2.5 mg via ORAL
  Filled 2011-09-17 (×2): qty 1

## 2011-09-17 NOTE — Progress Notes (Signed)
  Subjective: Patient denis CP  Does note a little sob when lying in bed  Occasional wheeze Objective: Filed Vitals:   09/08/11 1531 09/14/11 0505 09/17/11 0513  BP:   160/83  Pulse: 61  69  Temp:   98.3 F (36.8 C)  Resp:   18  Height:  5\' 10"  (1.778 m)   Weight:  239 lb 10.2 oz (108.7 kg)   SpO2:   96%   Weight change:   Intake/Output Summary (Last 24 hours) at 09/17/11 1007 Last data filed at 09/17/11 0645  Gross per 24 hour  Intake    240 ml  Output    825 ml  Net   -585 ml    General: Alert, awake, oriented x3, in no acute distress.  HEENT: No bruits, no goiter.  Heart: Regular rate and rhythm, without murmurs, rubs, gallops.  Lungs: Crackles left side, bilateral air movement.  Abdomen: Soft, nontender, nondistended, positive bowel sounds.  Ext:  Triv edema   Lab Results: Results for orders placed during the hospital encounter of 09/08/11 (from the past 24 hour(s))  GLUCOSE, CAPILLARY     Status: Normal   Collection Time   09/16/11 11:19 AM      Component Value Range   Glucose-Capillary 75  70 - 99 (mg/dL)   Comment 1 Notify RN    GLUCOSE, CAPILLARY     Status: Abnormal   Collection Time   09/16/11  4:19 PM      Component Value Range   Glucose-Capillary 101 (*) 70 - 99 (mg/dL)   Comment 1 Notify RN    GLUCOSE, CAPILLARY     Status: Normal   Collection Time   09/16/11 11:03 PM      Component Value Range   Glucose-Capillary 97  70 - 99 (mg/dL)  GLUCOSE, CAPILLARY     Status: Normal   Collection Time   09/17/11  7:10 AM      Component Value Range   Glucose-Capillary 98  70 - 99 (mg/dL)    Studies/Results: No results found.  Medications: Reviewed  Active Problems:  CVA (cerebral infarction)  Recent CVA    Continues PT.  Will contact SW re SNF>  CAD (coronary artery disease), native coronary artery  MIld by cath.  HTN (hypertension)  BP is elevated  Add Lisinopril 2.5.  Continue other meds.  D/C KCL  CHeck BMET in AM.  WIll give Lasix IV x 1 today    Dyslipidemia  On Crestor now for better control of LDL  DM (diabetes mellitus)  CS have improved.  Renal insufficiency  Check BMET in AM  Pneumonia  Completed Rx     LOS: 9 days   Dietrich Pates 09/17/2011, 10:07 AM

## 2011-09-18 LAB — GLUCOSE, CAPILLARY
Glucose-Capillary: 134 mg/dL — ABNORMAL HIGH (ref 70–99)
Glucose-Capillary: 110 mg/dL — ABNORMAL HIGH (ref 70–99)
Glucose-Capillary: 91 mg/dL (ref 70–99)
Glucose-Capillary: 114 mg/dL — ABNORMAL HIGH (ref 70–99)

## 2011-09-18 MED ORDER — ASPIRIN EC 81 MG PO TBEC
81.0000 mg | DELAYED_RELEASE_TABLET | Freq: Every day | ORAL | Status: DC
  Administered 2011-09-18 – 2011-09-19 (×2): 81 mg via ORAL
  Filled 2011-09-18 (×2): qty 1

## 2011-09-18 MED ORDER — LISINOPRIL 5 MG PO TABS
5.0000 mg | ORAL_TABLET | Freq: Every day | ORAL | Status: DC
  Administered 2011-09-18 – 2011-09-19 (×2): 5 mg via ORAL
  Filled 2011-09-18 (×2): qty 1

## 2011-09-18 NOTE — Progress Notes (Signed)
Utilization review completed. Nuri Branca, RN, BSN. 09/18/11 

## 2011-09-18 NOTE — Progress Notes (Signed)
Plan for discharge to Skilled Nursing Facility. Social Worker is following. Tomi Bamberger, RN,BSN Care Manager. [phone]

## 2011-09-18 NOTE — Progress Notes (Signed)
  Evaluated for possible admission.  Please see rehab MD consult recommending inpatient rehab vs skilled nursing facility.  Patient is doing too well for our inpatient rehab program.  Agree with home with home health or skilled nursing facility.

## 2011-09-18 NOTE — Progress Notes (Signed)
SUBJECTIVE:BP still running high but some improvement.  Patient has no complaints this am.  Still has left-sided weakness.    Filed Vitals:   09/17/11 0513 09/17/11 1427 09/17/11 2151 09/18/11 0358  BP: 160/83 168/82 169/90 144/83  Pulse: 69 67 66 62  Temp: 98.3 F (36.8 C) 97.9 F (36.6 C) 97.8 F (36.6 C) 97.2 F (36.2 C)  TempSrc:  Oral Oral Oral  Resp: 18 18 20 18   Height:      Weight:    106.278 kg (234 lb 4.8 oz)  SpO2: 96% 93% 96% 95%    Intake/Output Summary (Last 24 hours) at 09/18/11 0804 Last data filed at 09/18/11 0356  Gross per 24 hour  Intake      0 ml  Output   1727 ml  Net  -1727 ml    LABS: Basic Metabolic Panel:  Basename 09/17/11 1208 09/16/11 0630  NA 137 136  K 4.0 3.7  CL 101 101  CO2 26 23  GLUCOSE 97 217*  BUN 16 19  CREATININE 1.41* 1.36*  CALCIUM 9.4 8.6  MG -- --  PHOS -- --   BNP:  Basename 09/15/11 1029  POCBNP 505.6*    PHYSICAL EXAM General: NAD Neck: No JVD, no thyromegaly or thyroid nodule.  Lungs: Clear to auscultation bilaterally with normal respiratory effort. CV: Nondisplaced PMI.  Heart regular S1/S2, +S4, no murmur.  No peripheral edema. Right carotid bruit.  Normal pedal pulses.  Abdomen: Soft, nontender, no hepatosplenomegaly, no distention.  Neurologic: Alert and oriented x 3.  Psych: Normal affect. Extremities: No clubbing or cyanosis.   TELEMETRY: Reviewed telemetry pt in sinus rhythm  ASSESSMENT AND PLAN:  55 yo with resistant HTN, diastolic CHF, and CVA .   1. CVA: Still with left-sided weakness.  Continue ASA and statin.  Ready for SNF discharge. 2. HTN: BP high but improving.  He is on amlodipine, labetalol, spironolactone, and low dose lisinopril.  Will increase lisinopril to 5 mg daily today.  He will need close outpatient followup for this.  BMET today.  3. Diastolic CHF: EF preserved on echo with severe LVH.  4. Dispo: Discharge to SNF today if bed is ready.  Continue current meds except increasing  lisinopril and adding ASA 81.  Needs to Followup with Dr. Earnestine Leys in [place] in 10 days with BMET in 1 week.   Maddie Brazier [employer]

## 2011-09-18 NOTE — Progress Notes (Signed)
Cardiac Rehab [phone] Completed CHF education with pt. Discussed daily weights, s&s of CHF,when to call MD and low sodium low carb diet. He voices understanding.

## 2011-09-18 NOTE — Progress Notes (Signed)
Physical Therapy Treatment Patient Details Name: Matthew Cain MRN: [id number] DOB: [birth year] Today's Date: 09/18/2011  PT Assessment/Plan   PT Goals  Acute Rehab PT Goals PT Goal Formulation: With patient Time For Goal Achievement: 2 weeks Pt will go Supine/Side to Sit: Independently PT Goal: Supine/Side to Sit - Progress: Other (comment) Pt will Transfer Sit to Stand/Stand to Sit: with modified independence PT Transfer Goal: Sit to Stand/Stand to Sit - Progress: Progressing toward goal Pt will Transfer Bed to Chair/Chair to Bed: with supervision PT Transfer Goal: Bed to Chair/Chair to Bed - Progress: Progressing toward goal Pt will Ambulate: >150 feet;with least restrictive assistive device;with supervision PT Goal: Ambulate - Progress: Progressing toward goal  PT Treatment Precautions/Restrictions  Precautions Precautions: Fall Precaution Comments: chair alarm Restrictions Weight Bearing Restrictions: No Mobility (including Balance) Bed Mobility Bed Mobility: No Transfers Transfers: Yes Sit to Stand: 5: Supervision;With armrests;From chair/3-in-1;From toilet Sit to Stand Details (indicate cue type and reason): worked on control for last 6 in of sit Ambulation/Gait Ambulation/Gait: Yes Ambulation/Gait Assistance: 5: Supervision Ambulation/Gait Assistance Details (indicate cue type and reason):  verbal cues for changing speed and changing pattern Ambulation Distance (Feet): 400 Feet Assistive device: None Gait Pattern: Left foot flat;Decreased stance time - right;Decreased step length - left;Decreased dorsiflexion - left;Decreased weight shift to left (with improved left heel strike, pt loses left knee control) Gait velocity: decreased pace Stairs: No Wheelchair Mobility Wheelchair Mobility: No  Posture/Postural Control Posture/Postural Control: Postural limitations Postural Limitations: pt with several minor LOB with self-correction Balance Balance Assessed:  Yes Static Standing Balance Single Leg Stance - Right Leg: 3  Exercise  General Exercises - Lower Extremity Mini-Sqauts: 10 reps;Strengthening;AROM;Standing;Limitations (difficulty keeping wt posterior) Mini Squats Limitations: tends to let knees migrate anteriorly, vc's to push hips posterior End of Session PT - End of Session Equipment Utilized During Treatment: Gait belt Activity Tolerance: Patient tolerated treatment well Patient left: in chair;with call bell in reach;with bed alarm set General Behavior During Session: [facility] for tasks performed Cognition: [facility] for tasks performed  Jasan Doughtie, [place] 09/18/2011, 1:37 PM

## 2011-09-18 NOTE — Progress Notes (Signed)
OT treatment session completed, however information not populating into progress note. Please see OT navigator for specifics about session. Continue to recommend ST-SNF for DC plan. Micahel Omlor, OTR/L 09/18/2011

## 2011-09-18 NOTE — Plan of Care (Signed)
Problem: Phase III Progression Outcomes Goal: Pt without medical complications Outcome: Progressing Improving L sided weakness. PT/OT involved. Will d/c to ST rehab

## 2011-09-19 ENCOUNTER — Telehealth: Payer: Self-pay | Admitting: Cardiovascular Disease

## 2011-09-19 ENCOUNTER — Encounter (HOSPITAL_COMMUNITY): Payer: Self-pay | Admitting: Cardiovascular Disease

## 2011-09-19 DIAGNOSIS — I279 Pulmonary heart disease, unspecified: Secondary | ICD-10-CM

## 2011-09-19 LAB — GLUCOSE, CAPILLARY
Glucose-Capillary: 99 mg/dL (ref 70–99)
Glucose-Capillary: 108 mg/dL — ABNORMAL HIGH (ref 70–99)
Glucose-Capillary: 76 mg/dL (ref 70–99)

## 2011-09-19 LAB — ALDOSTERONE: Aldosterone, Serum: 4 ng/dL

## 2011-09-19 MED ORDER — LABETALOL HCL 200 MG PO TABS: 800.0000 mg | ORAL_TABLET | Freq: Two times a day (BID) | ORAL | Status: DC

## 2011-09-19 MED ORDER — SPIRONOLACTONE 25 MG PO TABS: 25.0000 mg | ORAL_TABLET | Freq: Every day | ORAL | Status: DC

## 2011-09-19 MED ORDER — LISINOPRIL 5 MG PO TABS: 5.0000 mg | ORAL_TABLET | Freq: Every day | ORAL | Status: DC

## 2011-09-19 MED ORDER — ACETAMINOPHEN 325 MG PO TABS: 650.0000 mg | ORAL_TABLET | Freq: Four times a day (QID) | ORAL | Status: AC | PRN

## 2011-09-19 MED ORDER — ASPIRIN 81 MG PO TBEC: 81.0000 mg | DELAYED_RELEASE_TABLET | Freq: Every day | ORAL | Status: DC

## 2011-09-19 MED ORDER — FUROSEMIDE 20 MG PO TABS: 20.0000 mg | ORAL_TABLET | Freq: Every day | ORAL | Status: DC

## 2011-09-19 MED ORDER — ROSUVASTATIN CALCIUM 20 MG PO TABS: 20.0000 mg | ORAL_TABLET | Freq: Every day | ORAL | Status: DC

## 2011-09-19 MED ORDER — PANTOPRAZOLE SODIUM 40 MG PO TBEC: 40.0000 mg | DELAYED_RELEASE_TABLET | Freq: Every day | ORAL | Status: DC

## 2011-09-19 MED ORDER — AMLODIPINE BESYLATE 10 MG PO TABS: 10.0000 mg | ORAL_TABLET | Freq: Every day | ORAL | Status: DC

## 2011-09-19 NOTE — Discharge Summary (Signed)
Physician Discharge Summary  Patient ID: Matthew Cain MRN: [id number] DOB/AGE: 56-Aug-1957 56 y.o.  Admit date: 09/08/2011 Discharge date: 09/19/2011  Primary Discharge Diagnosis: Pulmonary edema  Secondary Discharge Diagnosis: VDRF, CVA, Acute diastolic CHF, HTN, DM, ARI, PNA Significant Diagnostic Studies: Cardiac catheterization, 2-D echocardiogram, MRI and MRA of the brain, renal ultrasound, chest x-ray  Consults: pulmonary/intensive care, neurology   Cain Course: Matthew Cain is a 55 year old male with no previous cardiac issues. He went to Matthew Cain with shortness of breath and was found to be in pulmonary edema. He also complained of some left-sided weakness. He was evaluated there by Dr. began and a bedside echocardiogram showed Matthew Cain motion abnormalities. He also had old changes of LVH and AS well as possible acute ischemic changes. A code STEMI  was called and he was transferred to [facility], and taken to the catheterization lab.  The cardiac catheterization showed mild nonobstructive coronary artery disease. Acute respiratory failure and in ventilation was felt secondary to pulmonary edema. The patient was transferred to Matthew Cain.  A pulmonary critical care consult was called to manage his ventilator. They followed him closely and he was diuresed. He was extubated on 09/09/2011. The critical care medicine team also managed his antibiotics. He was sedated while on the vent but then the sedation was discontinued. His blood sugars were managed with sliding scale insulin. He was also started on Protonix and heparinized.  Because of the weakness a CT of the head was performed which was negative. A CT of the abdomen and chest was also performed which showed a 4.4 cm thoracic ascending aortic aneurysm but no evidence for an abdominal aortic aneurysm. His echocardiogram showed an EF of 55% with severe LVH. He had pneumonia and was placed on antibiotics. The antibiotics were completed prior to  discharge. He was hypokalemic with diuresis and his creatinine increased to 1.98. Medications were adjusted and his creatinine improved. He still has mild renal insufficiency but this can be followed as an outpatient. As his condition improved he was seen by cardiac rehabilitation. Physical therapy and occupational therapy were recommended. A neurology consult was called. Aspirin was felt the most appropriate anticoagulation. A swallow screen was performed and he did well. Carotid Dopplers showed no evidence of ICA stenosis. His blood pressure medications were adjusted for better blood pressure control. He was seen by case management to discuss rehabilitation options. Short term skilled nursing facility for rehabilitation was recommended and the patient agreed. A bed search was done. [facility]. Matthew Cain is also provided nutrition education regarding diabetes. He was started on Crestor for better control of his LDL. Matthew Cain was anxious to get to rehabilitation in order to improve. On 09/19/2011 Matthew Cain felt that Matthew Cain was stable for rehabilitation, to followup as an outpatient.    Discharge Exam: Blood pressure 131/71, pulse 66, temperature 97.7 F (36.5 C), temperature source Oral, resp. rate 20, height 5\' 10"  (1.778 m), weight 234 lb 4.8 oz (106.278 kg), SpO2 94.00%.    Labs:   Lab Results  Component Value Date   WBC 5.6 09/14/2011   HGB 12.2* 09/14/2011   HCT 37.7* 09/14/2011   MCV 89.8 09/14/2011   PLT 233 09/14/2011    Lab 09/17/11 1208  NA 137  K 4.0  CL 101  CO2 26  BUN 16  CREATININE 1.41*  CALCIUM 9.4  PROT --  BILITOT --  ALKPHOS --  ALT --  AST --  GLUCOSE  97   Lab Results  Component Value Date   CKTOTAL 169 09/09/2011   CKMB 3.7 09/09/2011   TROPONINI <0.30 09/09/2011    Lab Results  Component Value Date   CHOL 231* 09/12/2011   CHOL 214* 09/09/2011   Lab Results  Component Value Date   HDL 47 09/12/2011   HDL 45  09/09/2011   Lab Results  Component Value Date   LDLCALC 129* 09/12/2011   LDLCALC 125* 09/09/2011   Lab Results  Component Value Date   TRIG 276* 09/12/2011   TRIG 222* 09/09/2011   Lab Results  Component Value Date   CHOLHDL 4.9 09/12/2011   CHOLHDL 4.8 09/09/2011   No results found for this basename: LDLDIRECT      Radiology: CXR - stable lung volumes EKG: SR, 76 bpm  FOLLOW UP PLANS AND APPOINTMENTS:  Matthew Cain requests followup in the Matthew Cain office while in the skilled nursing facility for rehabilitation. After he goes home to Matthew Cain we will change his followup to the Matthew Cain office.   He has a followup appointment with Matthew Cain on November 26 at 10:30 AM  Current Discharge Medication List    CONTINUE these medications which have NOT CHANGED   Details  citalopram (CELEXA) 20 MG tablet Take 20 mg by mouth every morning.     Digestive Enzymes (PAPAYA AND ENZYMES) CHEW Chew 4 tablets by mouth daily before supper.      furosemide (LASIX) 40 MG tablet Take 1 tablet (40 mg total) by mouth daily. Qty: 30 tablet, Refills: 0    ibuprofen (ADVIL,MOTRIN) 200 MG tablet Take 400 mg by mouth every 6 (six) hours as needed. pain     isosorbide mononitrate (IMDUR) 30 MG 24 hr tablet Take 30 mg by mouth daily.      sotalol (BETAPACE) 80 MG tablet Take 80 mg by mouth 2 (two) times daily.           BRING ALL MEDICATIONS WITH YOU TO FOLLOW UP APPOINTMENTS  Time spent with patient to include physician time: 55 min Signed: Theodore Cain 09/19/2011, 3:55 PM Co-Sign MD

## 2011-09-19 NOTE — Progress Notes (Signed)
Subjective: Pt getting stronger. No CP/SOB.  Objective: Filed Vitals:   09/18/11 0358 09/18/11 1300 09/18/11 2100 09/19/11 1300  BP: 144/83 146/88 179/85 131/71  Pulse: 62 65 72 66  Temp: 97.2 F (36.2 C) 98.7 F (37.1 C) 98.2 F (36.8 C) 97.7 F (36.5 C)  TempSrc: Oral Oral Oral Oral  Resp: 18 18 18 20   Height:      Weight: 234 lb 4.8 oz (106.278 kg)     SpO2: 95% 97% 95% 94%   Weight change:   Intake/Output Summary (Last 24 hours) at 09/19/11 1540 Last data filed at 09/19/11 1100  Gross per 24 hour  Intake    720 ml  Output   1251 ml  Net   -531 ml    General: Alert, awake, oriented x3, in no acute distress.  HEENT: No bruits, no goiter.  Heart: Regular rate and rhythm, with murmur Lungs: rales bases, good bilateral air movement.  Abdomen: Soft, nontender, nondistended, positive bowel sounds.  Neuro: Grossly intact, nonfocal.   Lab Results:  [facility] 09/17/11 1208  NA 137  K 4.0  CL 101  CO2 26  GLUCOSE 97  BUN 16  CREATININE 1.41*  CALCIUM 9.4  MG --  PHOS --       Medications: I have reviewed the patient's current medications.   Patient Active Hospital Problem List: CVA (cerebral infarction) (09/17/2011)   Assessment: Continue current treatment  CAD (coronary artery disease), native coronary artery (09/17/2011)   Assessment: No ischemic symptoms continued continue current therapy   HTN (hypertension) (09/17/2011)   Assessment: Control is improving continue current meds   Dyslipidemia (09/17/2011)   Assessment: Continued therapy reevaluate as outpatient   DM (diabetes mellitus) (09/17/2011)   Assessment: CBGs are improved   Renal insufficiency (09/17/2011)   Assessment: BUN/creatinine are stable, followup as outpatient  Pneumonia (09/17/2011)   Assessment: Antibiotics completed    LOS: 11 days   Thelia Tanksley 09/19/2011, 3:40 PM

## 2011-09-19 NOTE — Progress Notes (Signed)
CARDIAC REHAB PHASE I   PRE:  Rate/Rhythm: 64SR  BP:  Supine:   Sitting: 140/84  Standing:    SaO2: 97%  MODE:  Ambulation: 550 ft   POST:  Rate/Rhythem: 65  BP:  Supine:   Sitting: 178/78  Standing:    SaO2: 99%RA  Pt ambulated 550 ft with handheld asst x 2. Did not want to use walker. Pt stated he was concentrating on controlling right leg. Tolerated well. To chair with call bell. [phone]  Duanne Limerick

## 2011-09-19 NOTE — Telephone Encounter (Signed)
Social worker with [facility] wants to talk to you

## 2011-09-19 NOTE — Telephone Encounter (Signed)
Social Worker called to speak to Dr Dicie Beam regarding a hospital pt.  I gave her his pager number since he is in the cath lab at [facility] today.

## 2011-09-23 LAB — RENIN: Renin Activity: 0.42 ng/mL/h (ref 0.25–5.82)

## 2011-10-06 ENCOUNTER — Encounter: Payer: Self-pay | Admitting: *Deleted

## 2011-10-09 ENCOUNTER — Encounter: Payer: Self-pay | Admitting: Physician Assistant

## 2011-10-09 ENCOUNTER — Ambulatory Visit (INDEPENDENT_AMBULATORY_CARE_PROVIDER_SITE_OTHER): Payer: Self-pay | Admitting: Physician Assistant

## 2011-10-09 DIAGNOSIS — I10 Essential (primary) hypertension: Secondary | ICD-10-CM

## 2011-10-09 DIAGNOSIS — E119 Type 2 diabetes mellitus without complications: Secondary | ICD-10-CM

## 2011-10-09 DIAGNOSIS — I2510 Atherosclerotic heart disease of native coronary artery without angina pectoris: Secondary | ICD-10-CM

## 2011-10-09 DIAGNOSIS — I509 Heart failure, unspecified: Secondary | ICD-10-CM

## 2011-10-09 DIAGNOSIS — I5032 Chronic diastolic (congestive) heart failure: Secondary | ICD-10-CM

## 2011-10-09 DIAGNOSIS — I639 Cerebral infarction, unspecified: Secondary | ICD-10-CM

## 2011-10-09 DIAGNOSIS — E785 Hyperlipidemia, unspecified: Secondary | ICD-10-CM

## 2011-10-09 DIAGNOSIS — N289 Disorder of kidney and ureter, unspecified: Secondary | ICD-10-CM

## 2011-10-09 NOTE — Assessment & Plan Note (Signed)
Repeat BP by me optimal.  No change in therapy.

## 2011-10-09 NOTE — Assessment & Plan Note (Signed)
Follow up Lipids and LFTs in 6 weeks.  This can be arranged at his follow up in [place] in 6 weeks.

## 2011-10-09 NOTE — Assessment & Plan Note (Signed)
Seems to be recovering.  He is applying for disability.  Continue ASA and statin.

## 2011-10-09 NOTE — Progress Notes (Signed)
History of Present Illness: PCP:  Dr. Baltazar Najjar at [facility] Primary Cardiologist:  Dr. Lewayne Bunting (in [place])  Matthew Cain is a 55 y.o. male presents for post hospital follow up.  He was admitted 10/26-11/6.  Presented in tx from [place].  Evaluated there for dyspnea and left sided weakness.  Noted to be in pulmonary edema and was intubated.  Quick look echo demonstrated WMA (ant HK) and there were ? EKG changes.  He was tx to [facility] as a code STEMI.  Head CT was neg for a bleed.  Chest and Abd CT notable for 4.4 ascending thoracic aortic aneurysm.  No dissection was noted and he underwent emergent LHC with Dr. Verne Carrow.  This demonstrated mild plaque in the LAD but no obstructive CAD.  He was tx with antibxs for suspected pneumonia.  Echo 09/08/11: severe LVH, no SAM, no LVOT gradient, EF 55%, mild BAE, mild reduced RVSF.  He was diuresed for diastolic CHF.  Left sided weakness was noted to worsen and neuro was asked to see the patient.  MRI 09/13/11:  Acute right ACA infarct involving corpus callosum.  MRA 09/13/11: severe intracranial ASD, right ACA occluded, left PCA and right MCA with severe disease.  Dopplers were neg for significant ICA stenosis.  Neuro dx him with post cath cardioembolic stroke.  Dr. Verne Carrow disagreed with this according to the notes as his weakness pre-dated his cath.  He was d/c to SNF for rehab.  He will eventually follow up in [place] but presents here today for follow up while he is in SNF.  He is staying at [facility].  He is doing well.  Denies chest pain or shortness of breath.  Working with PT/OT and denies significant limitations.  Notes his left UE is recovered.  His left LE is still weak.  Has some short term memory issues.  Denies orthopnea, PND or edema.  No syncope.  Does feel lightheaded after taking medications on an empty stomach.  Feels better if he takes medicines with food.  Follow up bmet on 11/14: K 4.1, creatinine 1.52.  Past  Medical History  Diagnosis Date  . Hypertension   . Chronic diastolic heart failure     s/p pulmo. edema c/b VDRF 11/12; Echo 09/08/11: severe LVH, no SAM, no LVOT gradient, EF 55%, mild BAE, mild reduced RVSF.  [name] History of right ACA stroke     10/12  --  MRI 09/13/11:  Acute right ACA infarct involving corpus callosum.  MRA 09/13/11: severe intracranial ASD, right ACA occluded, left PCA and right MCA with severe disease.  Dopplers were neg for significant ICA stenosis  . CAD (coronary artery disease)     LHC 10/12:  mild plaque in the LAD but no obstructive CAD  . Diabetes mellitus     A1C 7.3 08/2011  . CKD (chronic kidney disease)   . HLD (hyperlipidemia)     Current Outpatient Prescriptions  Medication Sig Dispense Refill  . acetaminophen (TYLENOL) 325 MG tablet Take 650 mg by mouth every 6 (six) hours as needed.        [name] amLODipine (NORVASC) 10 MG tablet Take 1 tablet (10 mg total) by mouth daily.  30 tablet  11  . aspirin EC 81 MG EC tablet Take 1 tablet (81 mg total) by mouth daily.  30 tablet  11  . citalopram (CELEXA) 20 MG tablet Take 20 mg by mouth every morning.       [name]  Digestive Enzymes (PAPAYA AND ENZYMES) CHEW Chew 4 tablets by mouth daily before supper.        . furosemide (LASIX) 20 MG tablet Take 20 mg by mouth daily.        [name] ibuprofen (ADVIL,MOTRIN) 200 MG tablet Take 400 mg by mouth every 6 (six) hours as needed. pain       . isosorbide mononitrate (IMDUR) 30 MG 24 hr tablet Take 30 mg by mouth daily.        [name] labetalol (NORMODYNE) 200 MG tablet Take 4 tablets (800 mg total) by mouth 2 (two) times daily.  240 tablet  11  . lisinopril (PRINIVIL,ZESTRIL) 5 MG tablet Take 1 tablet (5 mg total) by mouth daily.  30 tablet  11  . pantoprazole (PROTONIX) 40 MG tablet Take 1 tablet (40 mg total) by mouth daily at 12 noon.  30 tablet  11  . rosuvastatin (CRESTOR) 20 MG tablet Take 1 tablet (20 mg total) by mouth daily at 6 PM.  30 tablet  11  . spironolactone (ALDACTONE) 25  MG tablet Take 1 tablet (25 mg total) by mouth daily.  30 tablet  11    Allergies: No Known Allergies  History  Substance Use Topics  . Smoking status: Former Games developer  . Smokeless tobacco: Never Used  . Alcohol Use: No     Vital Signs: BP 140/80  Pulse 58  Ht 5\' 10"  (1.778 m)  Wt 237 lb 6.4 oz (107.684 kg)  BMI 34.06 kg/m2  Repeat BP by me 120/70  PHYSICAL EXAM: Well nourished, well developed, in no acute distress HEENT: normal Neck: no JVD Cardiac:  normal S1, S2; RRR; no murmur Lungs:  clear to auscultation bilaterally, no wheezing, rhonchi or rales Abd: soft, nontender, no hepatomegaly Ext: no edema; RFA site without hematoma or bruit Skin: warm and dry Neuro:  CNs 2-12 intact, no focal abnormalities noted  EKG:   Sinus brady, HR 58, LAD, LVH, TW inversions in 1, aVL, V4-6, inf Q waves  ASSESSMENT AND PLAN:

## 2011-10-09 NOTE — Assessment & Plan Note (Signed)
Minimal plaque on LHC.  Continue ASA and statin.

## 2011-10-09 NOTE — Assessment & Plan Note (Signed)
He will need a PCP once he lives [facility].  Dr. Felecia Shelling in [place] can see him and we have provided [name] with the phone number.

## 2011-10-09 NOTE — Patient Instructions (Addendum)
DR. Starla Link FANTA @ [phone] IS TAKING NEW PATIENTS, YOUR 1ST VISIT WILL BE $100. THEY ARE LOCATED IN THE 3RD BUILDING ON THE LEFT @ [facility].  Your physician recommends that you schedule a follow-up appointment in: 6 WEEKS WITH DR. Andee Lineman IN [place] AND THE SAME DAY YOU WILL NEED FASTING LIVER/LIPID [phone] HYPERLIPIDEMIA @ [facility], DR. Margarita Mail OFFICE CAN WRITE THIS ORDER FOR YOU WHEN YOU SEE DR. Andee Lineman

## 2011-10-09 NOTE — Assessment & Plan Note (Signed)
Volume stable.  Continue current Rx.  Recent creatinine stable.  He will follow up in [place] in the future.  Arrange follow up in 6 weeks with Dr. Lewayne Bunting.

## 2011-10-09 NOTE — Assessment & Plan Note (Signed)
Recent creatinine stable. 

## 2011-11-23 ENCOUNTER — Encounter: Payer: Self-pay | Admitting: Cardiology

## 2011-11-23 ENCOUNTER — Ambulatory Visit (INDEPENDENT_AMBULATORY_CARE_PROVIDER_SITE_OTHER): Payer: Self-pay | Admitting: Physician Assistant

## 2011-11-23 VITALS — BP 188/103 | HR 56 | Ht 70.5 in | Wt 240.5 lb

## 2011-11-23 DIAGNOSIS — E782 Mixed hyperlipidemia: Secondary | ICD-10-CM

## 2011-11-23 DIAGNOSIS — I712 Thoracic aortic aneurysm, without rupture: Secondary | ICD-10-CM | POA: Insufficient documentation

## 2011-11-23 DIAGNOSIS — Z79899 Other long term (current) drug therapy: Secondary | ICD-10-CM

## 2011-11-23 DIAGNOSIS — I10 Essential (primary) hypertension: Secondary | ICD-10-CM

## 2011-11-23 DIAGNOSIS — IMO0001 Reserved for inherently not codable concepts without codable children: Secondary | ICD-10-CM | POA: Insufficient documentation

## 2011-11-23 MED ORDER — SPIRONOLACTONE 25 MG PO TABS: 25.0000 mg | ORAL_TABLET | Freq: Every day | ORAL | Status: DC

## 2011-11-23 MED ORDER — ACCUPRIL 5 MG PO TABS: 5.0000 mg | ORAL_TABLET | Freq: Every day | ORAL | Status: DC

## 2011-11-23 MED ORDER — ISOSORBIDE MONONITRATE ER 30 MG PO TB24: 30.0000 mg | ORAL_TABLET | Freq: Every day | ORAL | Status: DC

## 2011-11-23 MED ORDER — METOPROLOL TARTRATE 100 MG PO TABS: 100.0000 mg | ORAL_TABLET | Freq: Two times a day (BID) | ORAL | Status: DC

## 2011-11-23 MED ORDER — AMLODIPINE BESYLATE 10 MG PO TABS: 10.0000 mg | ORAL_TABLET | Freq: Every day | ORAL | Status: DC

## 2011-11-23 MED ORDER — LIPITOR 40 MG PO TABS: 40.0000 mg | ORAL_TABLET | Freq: Every evening | ORAL | Status: DC

## 2011-11-23 NOTE — Assessment & Plan Note (Signed)
4.4 cm, by CT scan, 10/12, with no evidence of dissection. Will need continued close monitoring.

## 2011-11-23 NOTE — Assessment & Plan Note (Signed)
The patient has some residual memory deficit, but otherwise a near full recovery. Patient remains on low-dose aspirin.

## 2011-11-23 NOTE — Assessment & Plan Note (Signed)
Currently uncontrolled. However, we'll await results of complete blood work, before making further recommendations. Of note, patient is currently on Aldactone and low dose furosemide, but no supplemental potassium.

## 2011-11-23 NOTE — Progress Notes (Signed)
HPI: Patient presents to our [facility] clinic, so as to establish with Dr. Andee Lineman.  Patient initially presented to Dr. Andee Lineman, here at [facility] on October 26, when he presented with acute respiratory failure, in setting of presumed acute anterior MI. He was tended to by Dr. Andee Lineman, who found him to be in cardiogenic shock with class IV pulmonary edema. He was also concerned that the patient had a stroke, following presentation with left-sided weakness, prior to intubation. Notable findings also included a creatinine of 1.8. Initial echocardiogram suggested EF approximately 25%, with anterior Somoza, septum, and anterolateral Parkison akinesis. Although Dr. Andee Lineman considered performing a TEE to rule out dissection, following consultation with Dr. Sanjuana Kava the patient was transferred directly to [facility], so as to proceed with emergent cardiac catheterization. Also, patient presented with marked lymphocytosis and erythrocytosis.  He was subsequently found to have nonobstructive CAD by catheterization. Followup echocardiogram, at [facility], yielded EF 55% with mild RV dysfunction, and severe LVH (no SAM/LVOT gradient). Additional workup included CT of the abdomen, yielding 4.4 cm ascending TAA, with no dissection. Head CT was negative. MRI of brain: acute right ACA infarct, involving corpus callosum. MRA of the brain: severe intracranial ASD, occluded right ACA, and severe left PCA and right MCA disease. Carotid Dopplers: negative for significant ICA stenosis. Of note, although neurology diagnosed the patient with post cath cardioembolic stroke, Dr. Sanjuana Kava disagreed, citing the patient's presenting symptoms of left-sided weakness, prior to transfer from [facility].  Following an initial post hospital visit in our [place] clinic on November 26, patient has since been released from SNF, currently residing at home, alone. He is scheduled to establish with the health department, here in [facility], next  week.  Clinically, he is doing "much better". He denies any CP, DOE, orthopnea, PND, or LE edema. He quit smoking in 1996.   No Known Allergies  Current Outpatient Prescriptions  Medication Sig Dispense Refill  . acetaminophen (TYLENOL) 325 MG tablet Take 650 mg by mouth every 6 (six) hours as needed.        [name] amLODipine (NORVASC) 10 MG tablet Take 1 tablet (10 mg total) by mouth daily.  30 tablet  11  . aspirin EC 81 MG EC tablet Take 1 tablet (81 mg total) by mouth daily.  30 tablet  11  . citalopram (CELEXA) 20 MG tablet Take 20 mg by mouth every morning.       . Digestive Enzymes (PAPAYA AND ENZYMES) CHEW Chew 4 tablets by mouth daily before supper.        . furosemide (LASIX) 20 MG tablet Take 20 mg by mouth daily.        [name] glipiZIDE (GLUCOTROL) 10 MG tablet Take 10 mg by mouth daily.      [name] ibuprofen (ADVIL,MOTRIN) 200 MG tablet Take 400 mg by mouth every 6 (six) hours as needed. pain       . isosorbide mononitrate (IMDUR) 30 MG 24 hr tablet Take 30 mg by mouth daily.        [name] lisinopril (PRINIVIL,ZESTRIL) 5 MG tablet Take 1 tablet (5 mg total) by mouth daily.  30 tablet  11  . metoprolol (LOPRESSOR) 100 MG tablet Take 100 mg by mouth 2 (two) times daily.      . pantoprazole (PROTONIX) 40 MG tablet Take 1 tablet (40 mg total) by mouth daily at 12 noon.  30 tablet  11  . simvastatin (ZOCOR) 20 MG tablet Take 20 mg by mouth every evening.      [name]  spironolactone (ALDACTONE) 25 MG tablet Take 1 tablet (25 mg total) by mouth daily.  30 tablet  11    Past Medical History  Diagnosis Date  . Hypertension   . Chronic diastolic heart failure     s/p pulmo. edema c/b VDRF 11/12; Echo 09/08/11: severe LVH, no SAM, no LVOT gradient, EF 55%, mild BAE, mild reduced RVSF.  [name] History of right ACA stroke     10/12  --  MRI 09/13/11:  Acute right ACA infarct involving corpus callosum.  MRA 09/13/11: severe intracranial ASD, right ACA occluded, left PCA and right MCA with severe disease.  Dopplers  were neg for significant ICA stenosis  . CAD (coronary artery disease)     LHC 10/12:  mild plaque in the LAD but no obstructive CAD  . Diabetes mellitus     A1C 7.3 08/2011  . CKD (chronic kidney disease)   . HLD (hyperlipidemia)   . Thoracic aortic aneurysm     CT in 10/12: 4.4 cm ascending thoracic aortic aneurysm    History   Social History  . Marital Status: Divorced    Spouse Name: N/A    Number of Children: N/A  . Years of Education: N/A   Occupational History  . Not on file.   Social History Main Topics  . Smoking status: Former Smoker    Types: Cigarettes    Quit date: 11/13/1994  . Smokeless tobacco: Never Used  . Alcohol Use: No  . Drug Use: No  . Sexually Active: Not Currently   Other Topics Concern  . Not on file   Social History Narrative  . No narrative on file    No family history on file.  ROS: no nausea, vomiting; no fever, chills; no melena, hematochezia; no claudication  PHYSICAL EXAM:  BP 188/103  Pulse 56  Ht 5' 10.5" (1.791 m)  Wt 240 lb 8 oz (109.09 kg)  BMI 34.02 kg/m2 GENERAL: 56 year old male, moderately obese, sitting upright; NAD HEENT: NCAT, PERRLA, EOMI; sclera clear; no xanthelasma NECK: palpable bilateral carotid pulses, no bruits; no JVD; no TM LUNGS: CTA bilaterally CARDIAC: RRR (S1, S2); no significant murmurs; no rubs or gallops ABDOMEN: Protuberant; soft, non-tender EXTREMETIES: Trace peripheral edema SKIN: warm/dry; no obvious rash/lesions MUSCULOSKELETAL: no joint deformity NEURO: no focal deficit; NL affect   EKG: reviewed and available in Electronic Records   ASSESSMENT & PLAN:

## 2011-11-23 NOTE — Assessment & Plan Note (Addendum)
Will reassess lipid status with a FLP. Otherwise continue current dose of simvastatin.

## 2011-11-23 NOTE — Assessment & Plan Note (Signed)
Patient currently appears euvolemic, by history and PE. Will reassess LVF and RVF with a repeat 2-D echocardiogram. We'll also check complete labs, including a CMET, CBC, and BNP level. Will schedule early return followup with Dr. Andee Lineman, for review of echo results and further recommendations, particularly in regards to current cardiac medication regimen.

## 2011-11-23 NOTE — Patient Instructions (Signed)
   Echo  Your physician recommends that you go to the [facility] for lab work.  If the results of your test are normal or stable, you will receive a letter.  If they are abnormal, the nurse will contact you by phone. Follow up in  4-6 weeks - see above

## 2011-11-23 NOTE — Assessment & Plan Note (Signed)
We'll order followup labs, with recommended close monitoring of renal function, particularly given that patient remains on Aldactone.

## 2011-11-23 NOTE — Assessment & Plan Note (Signed)
Followed by primary M.D. Patient is on a statin. 

## 2011-11-30 ENCOUNTER — Other Ambulatory Visit (INDEPENDENT_AMBULATORY_CARE_PROVIDER_SITE_OTHER): Payer: Self-pay | Admitting: *Deleted

## 2011-11-30 DIAGNOSIS — I10 Essential (primary) hypertension: Secondary | ICD-10-CM

## 2011-11-30 DIAGNOSIS — I509 Heart failure, unspecified: Secondary | ICD-10-CM

## 2011-11-30 DIAGNOSIS — I5022 Chronic systolic (congestive) heart failure: Secondary | ICD-10-CM

## 2011-12-04 ENCOUNTER — Encounter: Payer: Self-pay | Admitting: *Deleted

## 2012-01-05 ENCOUNTER — Ambulatory Visit (INDEPENDENT_AMBULATORY_CARE_PROVIDER_SITE_OTHER): Payer: Self-pay | Admitting: Cardiology

## 2012-01-05 ENCOUNTER — Ambulatory Visit: Payer: Self-pay | Admitting: Cardiology

## 2012-01-05 ENCOUNTER — Encounter: Payer: Self-pay | Admitting: Cardiology

## 2012-01-05 VITALS — BP 156/80 | HR 64 | Ht 70.5 in | Wt 244.0 lb

## 2012-01-05 DIAGNOSIS — I10 Essential (primary) hypertension: Secondary | ICD-10-CM

## 2012-01-05 MED ORDER — QUINAPRIL HCL 10 MG PO TABS: 10.0000 mg | ORAL_TABLET | Freq: Every day | ORAL | Status: DC

## 2012-01-05 NOTE — Patient Instructions (Signed)
   Increase Accupril to 10mg  daily Your physician recommends that you go to the [facility] for lab work in 7-10 days for BMET If the results of your test are normal or stable, you will receive a letter.  If they are abnormal, the nurse will contact you by phone. Your physician wants you to follow up in: 6 months.  You will receive a reminder letter in the mail one-two months in advance.  If you don't receive a letter, please call our office to schedule the follow up appointment

## 2012-01-06 NOTE — Progress Notes (Signed)
Matthew Bottoms, MD, [facility] ABIM Board Certified in Adult Cardiovascular Medicine,Internal Medicine and Critical Care Medicine    CC: chronic diastolic heart failure with mild dyspnea  HPI:  The patient is a 56 year old male admitted last year with acute on chronic diastolic heart failure secondary to hypertensive heart disease.  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  An MRI demonstrated severe intracranial atherosclerotic disease.  Carotid Dopplers were however negative for significant carotid artery disease. The patient has few limited residual effects from his prior stroke.  He still states that he has some difficulty with weakness and strength in his left leg his left arm has completely recovered.  He also reports intermittent memory loss which started after his stroke. The patient however is fairly active and he states that when he walks his dog he has no chest pain and very little shortness of breath mainly related to deconditioning.  He has no orthopnea and PND.  He reports no heart failure symptoms. He does have a moderate ascending aortic aneurysm which will need followup in the future. His blood pressures and elevated in the office today and could benefit from better blood pressure control.  The patient states that he takes blood pressure medication and has never seen diastolic blood pressures greater than 90 mmHg with a systolic blood pressure readings remain elevated around 1 50 mmHg.   PMH: reviewed and listed in Problem List in Electronic Records (and see below) Past Medical History  Diagnosis Date  . Hypertension   . Chronic diastolic heart failure     s/p pulmo. edema c/b VDRF 11/12; Echo 09/08/11: severe LVH, no SAM, no LVOT gradient, EF 55%, mild BAE, mild reduced RVSF.  [name] History of right ACA stroke     10/12  --  MRI 09/13/11:  Acute right ACA infarct involving corpus callosum.  MRA 09/13/11: severe intracranial ASD, right ACA  occluded, left PCA and right MCA with severe disease.  Dopplers were neg for significant ICA stenosis  . CAD (coronary artery disease)     LHC 10/12:  mild plaque in the LAD but no obstructive CAD  . Diabetes mellitus     A1C 7.3 08/2011  . CKD (chronic kidney disease)   . HLD (hyperlipidemia)   . Thoracic aortic aneurysm     CT in 10/12: 4.4 cm ascending thoracic aortic aneurysm   Past Surgical History  Procedure Date  .  vasectomy     bilateral  . Cardiac catheterization 09/09/11    Allergies/SH/FHX : available in Electronic Records for review  No Known Allergies History   Social History  . Marital Status: Divorced    Spouse Name: N/A    Number of Children: N/A  . Years of Education: N/A   Occupational History  . Not on file.   Social History Main Topics  . Smoking status: Former Smoker -- 1.0 packs/day for 30 years    Types: Cigarettes    Quit date: 11/13/1994  . Smokeless tobacco: Never Used  . Alcohol Use: No  . Drug Use: No  . Sexually Active: Not Currently   Other Topics Concern  . Not on file   Social History Narrative  . No narrative on file   No family history on file.  Medications: Current Outpatient Prescriptions  Medication Sig Dispense Refill  . acetaminophen (TYLENOL) 325 MG tablet Take 650 mg by mouth every 6 (six) hours as needed.        [name]  amLODipine (NORVASC) 10 MG tablet Take 1 tablet (10 mg total) by mouth daily.  90 tablet  3  . aspirin EC 81 MG EC tablet Take 1 tablet (81 mg total) by mouth daily.  30 tablet  11  . citalopram (CELEXA) 20 MG tablet Take 20 mg by mouth every morning.       . Digestive Enzymes (PAPAYA AND ENZYMES) CHEW Chew 4 tablets by mouth daily before supper.        . furosemide (LASIX) 40 MG tablet Take 40 mg by mouth 2 (two) times daily.      [name] glipiZIDE (GLUCOTROL XL) 5 MG 24 hr tablet Take 10 mg by mouth daily.      [name] ibuprofen (ADVIL,MOTRIN) 200 MG tablet Take 400 mg by mouth every 6 (six) hours as needed. pain        . isosorbide mononitrate (IMDUR) 30 MG 24 hr tablet Take 1 tablet (30 mg total) by mouth daily.  90 tablet  3  . LIPITOR 40 MG tablet Take 1 tablet (40 mg total) by mouth every evening.  90 tablet  3  . metFORMIN (GLUCOPHAGE) 500 MG tablet Take 500 mg by mouth 2 (two) times daily with a meal.      . metoprolol succinate (TOPROL-XL) 50 MG 24 hr tablet Take 200 mg by mouth daily. Take with or immediately following a meal.      . spironolactone (ALDACTONE) 25 MG tablet Take 1 tablet (25 mg total) by mouth daily.  90 tablet  3  . quinapril (ACCUPRIL) 10 MG tablet Take 1 tablet (10 mg total) by mouth daily.        ROS: No nausea or vomiting. No fever or chills.No melena or hematochezia.No bleeding.No claudication  Physical Exam: BP 156/80  Pulse 64  Ht 5' 10.5" (1.791 m)  Wt 244 lb (110.678 kg)  BMI 34.52 kg/m2 General:well-nourished African American male in no distress Neck:normal carotid upstroke.  No carotid bruits.  No thyromegaly.  Nonnodular thyroid.  JVP is 5 cm Lungs:clear breath sounds bilaterally without wheezing Cardiac:regular rate and rhythm with normal S1 and S2 and no murmur rubs or gallops. Vascular:no edema.  Normal distal pulses Skin:warm and dry Neurologic: Normal strength in upper extremities decreased strength in left lower leg Physcologic:normal affect  12lead ECG:we'll performed Limited bedside ECHO:N/A   Patient Active Problem List  Diagnoses  . CVA (cerebral infarction)-improvement in residual symptoms  . CAD (coronary artery disease), native coronary artery  . HTN (hypertension)next poorly controlled  . Dyslipidemia  . DM (diabetes mellitus)  . Renal insufficiency  . Pneumonia  . Chronic diastolic heart failure-stable no evidence of volume overload  . Thoracic aortic aneurysm-4.4 cm    PLAN   The patient still is hypertensive and particularly in light of his history of hypertensive heart disease with acute and chronic diastolic heart  failure/pulmonary edema in his thoracic aneurysm he needs better blood pressure control.  I increased her Accupril to 10 mg by mouth daily.  Asked patient to followup on his blood pressure readings.  We will obtain a BMET in one week to check his creatinine and potassium and increase in ACE inhibitor  During his next followup visit also we will reevaluate him for resizing obvious thoracic aortic aneurysm.  This will need close followup in the future.  I asked the patient to adhere to  therapeutic lifestyle changes.

## 2012-01-09 ENCOUNTER — Telehealth: Payer: Self-pay | Admitting: Cardiology

## 2012-01-09 NOTE — Telephone Encounter (Signed)
Patient went to the clinic with an order to have more labs drawn.

## 2012-01-09 NOTE — Telephone Encounter (Signed)
Spoke with Isabelle Course and she instructed me that patient does need labs re-drawn. Spoke with nurse at [facility] and relayed same message and she will draw.

## 2012-01-11 ENCOUNTER — Telehealth: Payer: Self-pay | Admitting: *Deleted

## 2012-01-11 NOTE — Telephone Encounter (Signed)
Patient informed. 

## 2012-01-11 NOTE — Telephone Encounter (Signed)
Message copied by Eustace Moore on Thu Jan 11, 2012  3:12 PM ------      Message from: Lewayne Bunting E      Created: Wed Jan 10, 2012  4:20 AM       Reviewed labs from health Department.  No action needs to be taken.  Blood sugar is elevated but patient is diabetic.  Followup with health department or primary care physician regarding this issue.diabetes poorly controlled with a hemoglobin A1c of 7.9.  BNP level is within normal limits no evidence of heart failure.

## 2012-01-22 ENCOUNTER — Encounter: Payer: Self-pay | Admitting: *Deleted

## 2012-02-13 ENCOUNTER — Ambulatory Visit (INDEPENDENT_AMBULATORY_CARE_PROVIDER_SITE_OTHER): Payer: Self-pay | Admitting: Cardiology

## 2012-02-13 ENCOUNTER — Other Ambulatory Visit: Payer: Self-pay | Admitting: *Deleted

## 2012-02-13 ENCOUNTER — Encounter: Payer: Self-pay | Admitting: Cardiology

## 2012-02-13 VITALS — BP 122/79 | HR 67 | Ht 70.5 in | Wt 241.0 lb

## 2012-02-13 DIAGNOSIS — F410 Panic disorder [episodic paroxysmal anxiety] without agoraphobia: Secondary | ICD-10-CM

## 2012-02-13 DIAGNOSIS — I5032 Chronic diastolic (congestive) heart failure: Secondary | ICD-10-CM

## 2012-02-13 DIAGNOSIS — I509 Heart failure, unspecified: Secondary | ICD-10-CM

## 2012-02-13 DIAGNOSIS — I2510 Atherosclerotic heart disease of native coronary artery without angina pectoris: Secondary | ICD-10-CM

## 2012-02-13 DIAGNOSIS — R002 Palpitations: Secondary | ICD-10-CM

## 2012-02-13 DIAGNOSIS — I10 Essential (primary) hypertension: Secondary | ICD-10-CM

## 2012-02-13 MED ORDER — CLONAZEPAM 0.5 MG PO TABS: 0.5000 mg | ORAL_TABLET | Freq: Two times a day (BID) | ORAL | Status: DC

## 2012-02-13 MED ORDER — QUINAPRIL HCL 10 MG PO TABS: 10.0000 mg | ORAL_TABLET | Freq: Every day | ORAL | Status: DC

## 2012-02-13 MED ORDER — ACEBUTOLOL HCL 200 MG PO CAPS: 200.0000 mg | ORAL_CAPSULE | Freq: Two times a day (BID) | ORAL | Status: DC

## 2012-02-13 MED ORDER — TRAZODONE HCL 50 MG PO TABS: 50.0000 mg | ORAL_TABLET | Freq: Every day | ORAL | Status: DC

## 2012-02-13 NOTE — Assessment & Plan Note (Signed)
No recurrent chest pain. Patient had a catheterization a year ago which showed nonobstructive coronary artery disease. He does report shortness of breath but this appears to be in the setting of anxiety and panic attacks

## 2012-02-13 NOTE — Assessment & Plan Note (Signed)
No clinical evidence of volume overload.

## 2012-02-13 NOTE — Progress Notes (Signed)
Peyton Bottoms, MD, [facility] ABIM Board Certified in Adult Cardiovascular Medicine,Internal Medicine and Critical Care Medicine    CC: Palpitations and shortness of breath  HPI:  The patient was referred from the health department because of sensations of panic attacks and anxiety associated with palpitations. The patient feels that he has several extra heartbeats followed by pauses. Is concerned about this particularly because it's associated with shortness of breath. However clinically the patient reports no angina and he has rare PVCs. There is also no evidence of heart failure. On further questioning the patient states that his been going on for 2 weeks because is under severe financial stress. He has been unable to sleep. He reports frequent panic attacks. His abilities being denied and patient has no further financial means. He denies any orthopnea PND presyncope or syncope.  PMH: reviewed and listed in Problem List in Electronic Records (and see below) Past Medical History  Diagnosis Date  . Hypertension   . Chronic diastolic heart failure     s/p pulmo. edema c/b VDRF 11/12; Echo 09/08/11: severe LVH, no SAM, no LVOT gradient, EF 55%, mild BAE, mild reduced RVSF.  [name] History of right ACA stroke     10/12  --  MRI 09/13/11:  Acute right ACA infarct involving corpus callosum.  MRA 09/13/11: severe intracranial ASD, right ACA occluded, left PCA and right MCA with severe disease.  Dopplers were neg for significant ICA stenosis  . CAD (coronary artery disease)     LHC 10/12:  mild plaque in the LAD but no obstructive CAD  . Diabetes mellitus     A1C 7.3 08/2011  . CKD (chronic kidney disease)   . HLD (hyperlipidemia)   . Thoracic aortic aneurysm     CT in 10/12: 4.4 cm ascending thoracic aortic aneurysm   Past Surgical History  Procedure Date  .  vasectomy     bilateral  . Cardiac catheterization 09/09/11    Allergies/SH/FHX : available in Electronic Records for review  No Known  Allergies History   Social History  . Marital Status: Divorced    Spouse Name: N/A    Number of Children: N/A  . Years of Education: N/A   Occupational History  . Not on file.   Social History Main Topics  . Smoking status: Former Smoker -- 1.0 packs/day for 30 years    Types: Cigarettes    Quit date: 11/13/2004  . Smokeless tobacco: Never Used  . Alcohol Use: No  . Drug Use: No  . Sexually Active: Not Currently   Other Topics Concern  . Not on file   Social History Narrative  . No narrative on file   No family history on file.  Medications: Current Outpatient Prescriptions  Medication Sig Dispense Refill  . acetaminophen (TYLENOL) 325 MG tablet Take 650 mg by mouth every 6 (six) hours as needed.        [name] amLODipine (NORVASC) 10 MG tablet Take 1 tablet (10 mg total) by mouth daily.  90 tablet  3  . aspirin EC 81 MG EC tablet Take 1 tablet (81 mg total) by mouth daily.  30 tablet  11  . citalopram (CELEXA) 20 MG tablet Take 20 mg by mouth every morning.       . Digestive Enzymes (PAPAYA AND ENZYMES) CHEW Chew 4 tablets by mouth daily before supper.        . furosemide (LASIX) 40 MG tablet Take 40 mg by mouth 2 (two) times  daily.      . glipiZIDE (GLUCOTROL XL) 5 MG 24 hr tablet Take 10 mg by mouth daily.      [name] ibuprofen (ADVIL,MOTRIN) 200 MG tablet Take 400 mg by mouth every 6 (six) hours as needed. pain       . isosorbide mononitrate (IMDUR) 30 MG 24 hr tablet Take 1 tablet (30 mg total) by mouth daily.  90 tablet  3  . LIPITOR 40 MG tablet Take 1 tablet (40 mg total) by mouth every evening.  90 tablet  3  . metFORMIN (GLUCOPHAGE) 500 MG tablet Take 500 mg by mouth 2 (two) times daily with a meal.      . quinapril (ACCUPRIL) 10 MG tablet Take 1 tablet (10 mg total) by mouth daily.      [name] spironolactone (ALDACTONE) 25 MG tablet Take 1 tablet (25 mg total) by mouth daily.  90 tablet  3  . acebutolol (SECTRAL) 200 MG capsule Take 1 capsule (200 mg total) by mouth 2 (two)  times daily.  60 capsule  6  . clonazePAM (KLONOPIN) 0.5 MG tablet Take 1 tablet (0.5 mg total) by mouth 2 (two) times daily.  60 tablet  0  . traZODone (DESYREL) 50 MG tablet Take 1 tablet (50 mg total) by mouth at bedtime.  30 tablet  0    ROS: No nausea or vomiting. No fever or chills.No melena or hematochezia.No bleeding.No claudication  Physical Exam: BP 122/79  Pulse 67  Ht 5' 10.5" (1.791 m)  Wt 241 lb (109.317 kg)  BMI 34.09 kg/m2 General: Well-nourished African American male very anxious Neck: Normal carotid upstroke no carotid bruits. No thyromegaly nonnodular thyroid. Lungs: Clear breath sounds bilaterally. No wheezing Cardiac: Regular rate and rhythm with normal S1-S2. No murmur rubs or gallops Vascular: No edema normal distal pulses Skin: Warm and dry Physcologic: Anxious and depressed affect  12lead ECG: Normal sinus rhythm with several PVCs nonspecific T-wave changes  Limited bedside ECHO:N/A No images are attached to the encounter.   Assessment and Plan  CAD (coronary artery disease), native coronary artery No recurrent chest pain. Patient had a catheterization a year ago which showed nonobstructive coronary artery disease. He does report shortness of breath but this appears to be in the setting of anxiety and panic attacks  Chronic diastolic heart failure No clinical evidence of volume overload.  HTN (hypertension) Blood pressure is under reasonable control.  Panic disorder Patient appears to have significant panic attacks and are associated with palpitations and PVCs. However in the office I monitored the patient with a single lead electrocardiogram and I only saw to PVCs. The patient is very anxious and feels very panicky because is in financial does distress I do not think that the patient has any specific cardiac problems but given the PVCs I will switch his metoprolol XL to  acebutolol 200 mg twice a day. I also gave her a prescription for 30 days  clonazepam 0.5 mg by mouth twice a day and given his insomnia trazodone 50 minutes by mouth each bedtime. The patient is also already taking citalopram.    Patient Active Problem List  Diagnoses  . CVA (cerebral infarction)  . CAD (coronary artery disease), native coronary artery  . HTN (hypertension)  . Dyslipidemia  . DM (diabetes mellitus)  . Renal insufficiency  . Pneumonia  . Chronic diastolic heart failure  . Thoracic aortic aneurysm  . Panic disorder

## 2012-02-13 NOTE — Assessment & Plan Note (Signed)
Blood pressure is under reasonable control

## 2012-02-13 NOTE — Assessment & Plan Note (Signed)
Patient appears to have significant panic attacks and are associated with palpitations and PVCs. However in the office I monitored the patient with a single lead electrocardiogram and I only saw to PVCs. The patient is very anxious and feels very panicky because is in financial does distress I do not think that the patient has any specific cardiac problems but given the PVCs I will switch his metoprolol XL to  acebutolol 200 mg twice a day. I also gave her a prescription for 30 days clonazepam 0.5 mg by mouth twice a day and given his insomnia trazodone 50 minutes by mouth each bedtime. The patient is also already taking citalopram.

## 2012-02-13 NOTE — Patient Instructions (Signed)
   Stop Toprol XL  Change to Acebutolol 200mg  twice a day   Clonazepam 0.5mg  twice a day    Trazodone 50mg  every evening   Follow up in  4 weeks

## 2012-03-01 ENCOUNTER — Ambulatory Visit: Payer: Self-pay | Admitting: Physician Assistant

## 2012-03-07 ENCOUNTER — Other Ambulatory Visit: Payer: Self-pay | Admitting: Cardiology

## 2012-03-13 ENCOUNTER — Encounter: Payer: Self-pay | Admitting: Physician Assistant

## 2012-03-13 ENCOUNTER — Ambulatory Visit (INDEPENDENT_AMBULATORY_CARE_PROVIDER_SITE_OTHER): Payer: Self-pay | Admitting: Physician Assistant

## 2012-03-13 VITALS — BP 128/75 | HR 66 | Ht 70.5 in | Wt 241.5 lb

## 2012-03-13 DIAGNOSIS — I10 Essential (primary) hypertension: Secondary | ICD-10-CM

## 2012-03-13 DIAGNOSIS — R002 Palpitations: Secondary | ICD-10-CM | POA: Insufficient documentation

## 2012-03-13 DIAGNOSIS — I4891 Unspecified atrial fibrillation: Secondary | ICD-10-CM

## 2012-03-13 MED ORDER — TRAZODONE HCL 50 MG PO TABS: 25.0000 mg | ORAL_TABLET | Freq: Every day | ORAL | Status: DC

## 2012-03-13 MED ORDER — CLONAZEPAM 0.5 MG PO TABS: 0.5000 mg | ORAL_TABLET | Freq: Two times a day (BID) | ORAL | Status: DC

## 2012-03-13 NOTE — Progress Notes (Addendum)
HPI: Patient returns for early scheduled followup, after recent visit with Dr. Andee Lineman.  Patient reports marked improvement following recent med adjustment, by Dr. Andee Lineman. Notably, Toprol was replaced with acebutolol, so as to better suppress noted PVCs with associated palpitations and her symptoms at a. This has resulted in a significant improvement in his HTN. Whereas before he would have systolics greater than 200, now they are below 150. He is also sleeping better with the trazodone. Regarding palpitations, he still has an occasional "skipped" beat, perhaps a few an hour. This does seem to cause him continued anxiety, however, as does his mild DOE. Of note, patient denies any symptoms suggestive of recurrent DHF.  EKG in the office today, reviewed by me, indicates NSR 67 bpm; LAD; nonspecific ST changes   No Known Allergies  Current Outpatient Prescriptions  Medication Sig Dispense Refill  . acebutolol (SECTRAL) 200 MG capsule Take 1 capsule (200 mg total) by mouth 2 (two) times daily.  60 capsule  6  . acetaminophen (TYLENOL) 325 MG tablet Take 650 mg by mouth every 6 (six) hours as needed.        [name] amLODipine (NORVASC) 10 MG tablet Take 1 tablet (10 mg total) by mouth daily.  90 tablet  3  . aspirin EC 81 MG EC tablet Take 1 tablet (81 mg total) by mouth daily.  30 tablet  11  . citalopram (CELEXA) 20 MG tablet Take 20 mg by mouth every morning.       . clonazePAM (KLONOPIN) 0.5 MG tablet Take 1 tablet (0.5 mg total) by mouth 2 (two) times daily.  60 tablet  0  . Digestive Enzymes (PAPAYA AND ENZYMES) CHEW Chew 4 tablets by mouth daily before supper.        . furosemide (LASIX) 40 MG tablet Take 40 mg by mouth 2 (two) times daily.      [name] glipiZIDE (GLUCOTROL XL) 5 MG 24 hr tablet Take 10 mg by mouth daily.      [name] ibuprofen (ADVIL,MOTRIN) 200 MG tablet Take 400 mg by mouth every 6 (six) hours as needed. pain       . isosorbide mononitrate (IMDUR) 30 MG 24 hr tablet Take 1 tablet (30 mg  total) by mouth daily.  90 tablet  3  . LIPITOR 40 MG tablet Take 1 tablet (40 mg total) by mouth every evening.  90 tablet  3  . metFORMIN (GLUCOPHAGE) 500 MG tablet Take 500 mg by mouth 2 (two) times daily with a meal.      . pantoprazole (PROTONIX) 40 MG tablet Take 40 mg by mouth daily.      . quinapril (ACCUPRIL) 10 MG tablet Take 1 tablet (10 mg total) by mouth daily.  90 tablet  3  . spironolactone (ALDACTONE) 25 MG tablet Take 1 tablet (25 mg total) by mouth daily.  90 tablet  3  . traZODone (DESYREL) 50 MG tablet Take 25 mg by mouth at bedtime.      [name] DISCONTD: traZODone (DESYREL) 50 MG tablet Take 1 tablet (50 mg total) by mouth at bedtime.  30 tablet  0    Past Medical History  Diagnosis Date  . Hypertension   . Chronic diastolic heart failure     s/p pulmo. edema c/b VDRF 11/12; Echo 09/08/11: severe LVH, no SAM, no LVOT gradient, EF 55%, mild BAE, mild reduced RVSF.  [name] History of right ACA stroke     10/12  --  MRI 09/13/11:  Acute  right ACA infarct involving corpus callosum.  MRA 09/13/11: severe intracranial ASD, right ACA occluded, left PCA and right MCA with severe disease.  Dopplers were neg for significant ICA stenosis  . CAD (coronary artery disease)     LHC 10/12:  mild plaque in the LAD but no obstructive CAD  . Diabetes mellitus     A1C 7.3 08/2011  . CKD (chronic kidney disease)   . HLD (hyperlipidemia)   . Thoracic aortic aneurysm     CT in 10/12: 4.4 cm ascending thoracic aortic aneurysm    Past Surgical History  Procedure Date  .  vasectomy     bilateral  . Cardiac catheterization 09/09/11    History   Social History  . Marital Status: Divorced    Spouse Name: N/A    Number of Children: N/A  . Years of Education: N/A   Occupational History  . Not on file.   Social History Main Topics  . Smoking status: Former Smoker -- 1.0 packs/day for 30 years    Types: Cigarettes    Quit date: 11/13/1994  . Smokeless tobacco: Never Used  . Alcohol Use:  No  . Drug Use: No  . Sexually Active: Not Currently   Other Topics Concern  . Not on file   Social History Narrative  . No narrative on file    No family history on file.  ROS: no nausea, vomiting; no fever, chills; no melena, hematochezia; no claudication  PHYSICAL EXAM: BP 128/75  Pulse 66  Ht 5' 10.5" (1.791 m)  Wt 241 lb 8 oz (109.544 kg)  BMI 34.16 kg/m2 GENERAL: 56 year old male, morbidly obese; NAD HEENT: NCAT, PERRLA, EOMI; sclera clear; no xanthelasma NECK: palpable bilateral carotid pulses, no bruits; no JVD; no TM LUNGS: CTA bilaterally CARDIAC: RRR (S1, S2); no significant murmurs; no rubs or gallops ABDOMEN: soft, non-tender; intact BS EXTREMETIES: intact distal pulses; no significant peripheral edema SKIN: warm/dry; no obvious rash/lesions MUSCULOSKELETAL: no joint deformity NEURO: no focal deficit; NL affect   EKG: reviewed and available in Electronic Records  Patient seen and examined with Gene Charvis Lightner, PA-C.  Counseling was provided regarding the current medical condition and included: . Diagnosis, impressions, prognosis, recommended diagnostic studies  . Risks and benefits of treatment options  . Instructions for management, treatment and/or follow-up care  . Importance of compliance with treatment, risk factor reduction  . Patient and/or family education    Time spent counseling was 15 minutes and recorded in the Problem List documeted by Gene Brason Berthelot , PA-C   Alvin Critchley [facility] 03/17/2012 6:28 PM    ASSESSMENT & PLAN:

## 2012-03-13 NOTE — Assessment & Plan Note (Signed)
Following discussion with Dr. Andee Lineman, plan is to continue current doses of Klonopin and trazodone. We will also allow for 3 refills for each.

## 2012-03-13 NOTE — Assessment & Plan Note (Signed)
Much improved, following recent substitution of Toprol with Sectral. Continue at current dose.

## 2012-03-13 NOTE — Patient Instructions (Addendum)
Continue all current medications. Your physician wants you to follow up in: 6 months.  You will receive a reminder letter in the mail one-two months in advance.  If you don't receive a letter, please call our office to schedule the follow up appointment   

## 2012-03-13 NOTE — Assessment & Plan Note (Signed)
Stable on current dose of Sectral

## 2012-04-18 ENCOUNTER — Ambulatory Visit: Payer: Self-pay | Admitting: Cardiology

## 2012-05-13 ENCOUNTER — Other Ambulatory Visit: Payer: Self-pay | Admitting: *Deleted

## 2012-05-13 MED ORDER — QUINAPRIL HCL 10 MG PO TABS: 10.0000 mg | ORAL_TABLET | Freq: Every day | ORAL | Status: DC

## 2012-07-17 ENCOUNTER — Other Ambulatory Visit: Payer: Self-pay | Admitting: Physician Assistant

## 2012-07-17 ENCOUNTER — Encounter: Payer: Self-pay | Admitting: Physician Assistant

## 2012-07-19 ENCOUNTER — Telehealth: Payer: Self-pay | Admitting: Cardiology

## 2012-07-19 NOTE — Telephone Encounter (Signed)
Patient wants to follow [place] gave [phone]

## 2012-07-26 ENCOUNTER — Other Ambulatory Visit: Payer: Self-pay | Admitting: Cardiology

## 2012-07-26 NOTE — Telephone Encounter (Signed)
Patient requests a refill of Clonzepam 0.5 mg.  Patient was advised that we would ask, but may have to get filled with the health department.

## 2012-07-30 NOTE — Telephone Encounter (Signed)
Called patient about Clonazepam 0.5 mg and informed him that I spoke with [facility] nurse and that she said since Dr. Earnestine Leys was the 1st to prescribe this that he would have to get this from his PCP patient verbalized understanding.

## 2012-09-13 ENCOUNTER — Other Ambulatory Visit: Payer: Self-pay | Admitting: *Deleted

## 2012-09-13 MED ORDER — ACEBUTOLOL HCL 200 MG PO CAPS: 200.0000 mg | ORAL_CAPSULE | Freq: Two times a day (BID) | ORAL | Status: DC

## 2012-09-18 ENCOUNTER — Ambulatory Visit (INDEPENDENT_AMBULATORY_CARE_PROVIDER_SITE_OTHER): Payer: Medicaid Other | Admitting: Physician Assistant

## 2012-09-18 ENCOUNTER — Encounter: Payer: Self-pay | Admitting: Physician Assistant

## 2012-09-18 VITALS — BP 126/73 | HR 67 | Ht 70.5 in | Wt 232.0 lb

## 2012-09-18 DIAGNOSIS — F410 Panic disorder [episodic paroxysmal anxiety] without agoraphobia: Secondary | ICD-10-CM

## 2012-09-18 DIAGNOSIS — I5032 Chronic diastolic (congestive) heart failure: Secondary | ICD-10-CM

## 2012-09-18 DIAGNOSIS — I2510 Atherosclerotic heart disease of native coronary artery without angina pectoris: Secondary | ICD-10-CM

## 2012-09-18 MED ORDER — QUINAPRIL HCL 10 MG PO TABS: 10.0000 mg | ORAL_TABLET | Freq: Every day | ORAL | Status: DC

## 2012-09-18 MED ORDER — ATORVASTATIN CALCIUM 40 MG PO TABS: 40.0000 mg | ORAL_TABLET | Freq: Every evening | ORAL | Status: DC

## 2012-09-18 MED ORDER — AMLODIPINE BESYLATE 10 MG PO TABS: 10.0000 mg | ORAL_TABLET | Freq: Every day | ORAL | Status: DC

## 2012-09-18 MED ORDER — SPIRONOLACTONE 25 MG PO TABS: 25.0000 mg | ORAL_TABLET | Freq: Every day | ORAL | Status: DC

## 2012-09-18 MED ORDER — FUROSEMIDE 40 MG PO TABS: 40.0000 mg | ORAL_TABLET | Freq: Two times a day (BID) | ORAL | Status: DC

## 2012-09-18 MED ORDER — TRAZODONE HCL 50 MG PO TABS: 25.0000 mg | ORAL_TABLET | Freq: Every day | ORAL | Status: DC

## 2012-09-18 MED ORDER — ACEBUTOLOL HCL 200 MG PO CAPS: 200.0000 mg | ORAL_CAPSULE | Freq: Two times a day (BID) | ORAL | Status: DC

## 2012-09-18 MED ORDER — NITROGLYCERIN 0.4 MG SL SUBL: 0.4000 mg | SUBLINGUAL_TABLET | SUBLINGUAL | Status: DC | PRN

## 2012-09-18 MED ORDER — ISOSORBIDE MONONITRATE ER 30 MG PO TB24: 30.0000 mg | ORAL_TABLET | Freq: Every day | ORAL | Status: DC

## 2012-09-18 NOTE — Assessment & Plan Note (Signed)
Patient to resume regular followup with [facility], who is managing his Klonopin medication. Of note, however, he has requested a refill of his trazodone prescription, which was initiated by Dr. Andee Lineman, and which was very effective when needed to help him sleep. Will provide a short supply of this, but with subsequent monitoring and management to be deferred to his primary team at the Health Department.

## 2012-09-18 NOTE — Assessment & Plan Note (Signed)
Euvolemic by history and exam. Continue current diuretic regimen, followed by his primary team at the health department.

## 2012-09-18 NOTE — Patient Instructions (Addendum)
   Nitroglycerin as needed for severe chest pain only  Trazodone 50mg  - 1/2 tab at bedtime as needed for sleep - Discuss with primary MD or Health Department regarding maintenance refills on this medication.    All cardiac meds refilled & sent to [facility].   Continue all current medications. Your physician wants you to follow up in: 6 months.  You will receive a reminder letter in the mail one-two months in advance.  If you don't receive a letter, please call our office to schedule the follow up appointment

## 2012-09-18 NOTE — Assessment & Plan Note (Signed)
Followed by primary team. Patient is on a statin.

## 2012-09-18 NOTE — Assessment & Plan Note (Signed)
Quiescent on current medication regimen, which includes acebutolol.

## 2012-09-18 NOTE — Assessment & Plan Note (Signed)
Continue aggressive medical management. Will renew prescription for NTG. No current indication for ischemic evaluation. Patient had nonobstructive CAD by cardiac catheterization, 08/2011. Reassess clinical status in 6 months, at which time he will establish with Dr. Diona Browner. He indicates today that he prefers to remain with our practice here in [place].

## 2012-09-18 NOTE — Progress Notes (Signed)
Primary Cardiologist:  HPI: Patient returns for scheduled six-month followup.  In the interim, he was briefly hospitalized here at [facility] in early September with profound anemia (Hgb 6.3), requiring 2 unit PRBC transfusion. He underwent both upper and lower endoscopy, and found to have no evidence of active bleeding. An isolated polyp was noted during colonoscopy. Patient reports that he did present with melena. Subsequent post hospital followup notable for reported hemoglobin of 11.4. He denies any evidence of overt bleeding, and continues to tolerate low-dose ASA.[name]  From a cardiac standpoint, he continues to report no exertional chest discomfort. He has some mild DOE when walking up a flight of stairs. He denies any tachycardia palpitations.  No Known Allergies  Current Outpatient Prescriptions  Medication Sig Dispense Refill  . acebutolol (SECTRAL) 200 MG capsule Take 1 capsule (200 mg total) by mouth 2 (two) times daily.  60 capsule  6  . amLODipine (NORVASC) 10 MG tablet Take 1 tablet (10 mg total) by mouth daily.  30 tablet  6  . aspirin 81 MG EC tablet Take 81 mg by mouth daily.      [name] atorvastatin (LIPITOR) 40 MG tablet Take 1 tablet (40 mg total) by mouth every evening.  30 tablet  6  . citalopram (CELEXA) 20 MG tablet Take 20 mg by mouth every morning.       . clonazePAM (KLONOPIN) 0.5 MG tablet Take 0.5 mg by mouth 2 (two) times daily.      . Digestive Enzymes (PAPAYA AND ENZYMES) CHEW Chew 4 tablets by mouth daily before supper.        . ferrous sulfate (IRON SUPPLEMENT) 325 (65 FE) MG tablet Take 325 mg by mouth daily with breakfast.      . furosemide (LASIX) 40 MG tablet Take 1 tablet (40 mg total) by mouth 2 (two) times daily.  60 tablet  6  . glipiZIDE (GLUCOTROL XL) 5 MG 24 hr tablet Take 10 mg by mouth daily.      . isosorbide mononitrate (IMDUR) 30 MG 24 hr tablet Take 1 tablet (30 mg total) by mouth daily.  30 tablet  6  . metFORMIN (GLUCOPHAGE) 500 MG tablet Take 1,000 mg by  mouth daily with breakfast. & 1 tablet in evening      . Multiple Vitamin (MULTIVITAMIN) tablet Take 1 tablet by mouth 2 (two) times daily.      . NON FORMULARY Take 1 tablet by mouth 2 (two) times daily. Multi verified Dietary supplement for weight loss      . pantoprazole (PROTONIX) 40 MG tablet Take 40 mg by mouth daily.      . quinapril (ACCUPRIL) 10 MG tablet Take 1 tablet (10 mg total) by mouth daily.  30 tablet  6  . spironolactone (ALDACTONE) 25 MG tablet Take 1 tablet (25 mg total) by mouth daily.  30 tablet  6  . [DISCONTINUED] acebutolol (SECTRAL) 200 MG capsule Take 1 capsule (200 mg total) by mouth 2 (two) times daily.  60 capsule  0  . [DISCONTINUED] amLODipine (NORVASC) 10 MG tablet Take 1 tablet (10 mg total) by mouth daily.  90 tablet  3  . [DISCONTINUED] clonazePAM (KLONOPIN) 0.5 MG tablet Take 1 tablet (0.5 mg total) by mouth 2 (two) times daily.  60 tablet  3  . [DISCONTINUED] furosemide (LASIX) 40 MG tablet Take 40 mg by mouth 2 (two) times daily.      . [DISCONTINUED] isosorbide mononitrate (IMDUR) 30 MG 24 hr tablet Take 1  tablet (30 mg total) by mouth daily.  90 tablet  3  . [DISCONTINUED] LIPITOR 40 MG tablet Take 1 tablet (40 mg total) by mouth every evening.  90 tablet  3  . [DISCONTINUED] quinapril (ACCUPRIL) 10 MG tablet Take 1 tablet (10 mg total) by mouth daily.  90 tablet  3  . [DISCONTINUED] spironolactone (ALDACTONE) 25 MG tablet Take 1 tablet (25 mg total) by mouth daily.  90 tablet  3  . nitroGLYCERIN (NITROSTAT) 0.4 MG SL tablet Place 1 tablet (0.4 mg total) under the tongue every 5 (five) minutes as needed for chest pain.  25 tablet  3  . traZODone (DESYREL) 50 MG tablet Take 0.5 tablets (25 mg total) by mouth at bedtime. (as needed for sleep)  30 tablet  1    Past Medical History  Diagnosis Date  . Hypertension   . Chronic diastolic heart failure     EF 16-10%, grade 1 diastolic dysfunction; no WMAs, echo, 11/2011; s/p pulmo. edema c/b VDRF 11/12; Echo  09/08/11: severe LVH, no SAM, no LVOT gradient, EF 55%, mild BAE, mild reduced RVSF.  [name] History of right ACA stroke     10/12  --  MRI 09/13/11:  Acute right ACA infarct involving corpus callosum.  MRA 09/13/11: severe intracranial ASD, right ACA occluded, left PCA and right MCA with severe disease.  Dopplers were neg for significant ICA stenosis  . CAD (coronary artery disease)     LHC 10/12:  mild plaque in the LAD but no obstructive CAD  . Diabetes mellitus     A1C 7.3 08/2011  . CKD (chronic kidney disease)   . HLD (hyperlipidemia)   . Thoracic aortic aneurysm     CT in 10/12: 4.4 cm ascending thoracic aortic aneurysm    Past Surgical History  Procedure Date  .  vasectomy     bilateral  . Cardiac catheterization 09/09/11    History   Social History  . Marital Status: Divorced    Spouse Name: N/A    Number of Children: N/A  . Years of Education: N/A   Occupational History  . Not on file.   Social History Main Topics  . Smoking status: Former Smoker -- 1.0 packs/day for 30 years    Types: Cigarettes    Quit date: 11/13/1994  . Smokeless tobacco: Never Used  . Alcohol Use: No  . Drug Use: No  . Sexually Active: Not Currently   Other Topics Concern  . Not on file   Social History Narrative  . No narrative on file    No family history on file.  ROS: no nausea, vomiting; no fever, chills; no melena, hematochezia; no claudication  PHYSICAL EXAM: BP 126/73  Pulse 67  Ht 5' 10.5" (1.791 m)  Wt 232 lb (105.235 kg)  BMI 32.82 kg/m2 GENERAL: 56 year old male; NAD HEENT: NCAT, PERRLA, EOMI; sclera clear; no xanthelasma NECK: palpable bilateral carotid pulses, no bruits; no JVD; no TM LUNGS: CTA bilaterally CARDIAC: RRR (S1, S2); no significant murmurs; no rubs or gallops ABDOMEN: soft, non-tender; intact BS EXTREMETIES: no significant peripheral edema SKIN: warm/dry; no obvious rash/lesions MUSCULOSKELETAL: no joint deformity NEURO: no focal deficit; NL  affect   EKG:    ASSESSMENT & PLAN:  CAD (coronary artery disease), native coronary artery Continue aggressive medical management. Will renew prescription for NTG. No current indication for ischemic evaluation. Patient had nonobstructive CAD by cardiac catheterization, 08/2011. Reassess clinical status in 6 months, at which time he will establish  with Dr. Diona Browner. He indicates today that he prefers to remain with our practice here in [place].  Chronic diastolic heart failure Euvolemic by history and exam. Continue current diuretic regimen, [facility].  Palpitations Quiescent on current medication regimen, which includes acebutolol.  DM (diabetes mellitus) Followed by primary team. Patient is on a statin.  HTN (hypertension) Well-controlled on current medication regimen.  Panic disorder Patient to resume regular followup with [facility], who is managing his Klonopin medication. Of note, however, he has requested a refill of his trazodone prescription, which was initiated by Dr. Andee Lineman, and which was very effective when needed to help him sleep. Will provide a short supply of this, but with subsequent monitoring and management to be deferred to [facility].    Gene Arsenia Goracke, PAC

## 2012-09-18 NOTE — Assessment & Plan Note (Signed)
Well-controlled on current medication regimen 

## 2012-10-14 ENCOUNTER — Other Ambulatory Visit: Payer: Self-pay | Admitting: Cardiology

## 2012-10-14 ENCOUNTER — Telehealth: Payer: Self-pay | Admitting: Cardiology

## 2012-10-14 MED ORDER — ACEBUTOLOL HCL 200 MG PO CAPS: 200.0000 mg | ORAL_CAPSULE | Freq: Two times a day (BID) | ORAL | Status: DC

## 2012-10-14 NOTE — Telephone Encounter (Signed)
Patient wants letter stating he is diabled so that he can receive benefits

## 2012-10-14 NOTE — Telephone Encounter (Signed)
CANCELLED REQUEST - PATIENT WILL BE FOLLOWING DR Andee Lineman

## 2012-12-03 DIAGNOSIS — I5032 Chronic diastolic (congestive) heart failure: Secondary | ICD-10-CM | POA: Insufficient documentation

## 2012-12-03 DIAGNOSIS — I5042 Chronic combined systolic (congestive) and diastolic (congestive) heart failure: Secondary | ICD-10-CM | POA: Insufficient documentation

## 2013-04-17 ENCOUNTER — Ambulatory Visit (INDEPENDENT_AMBULATORY_CARE_PROVIDER_SITE_OTHER): Payer: Self-pay | Admitting: Neurology

## 2013-04-17 VITALS — BP 130/84 | HR 70 | Wt 233.0 lb

## 2013-04-17 DIAGNOSIS — I6350 Cerebral infarction due to unspecified occlusion or stenosis of unspecified cerebral artery: Secondary | ICD-10-CM

## 2013-04-17 DIAGNOSIS — I639 Cerebral infarction, unspecified: Secondary | ICD-10-CM

## 2013-04-17 NOTE — Progress Notes (Signed)
Patient seen today for Accelerate Study Visit 7.  Revised ICF version  Conmeds and AEs were reviewed.  Health questinnnire , ECG and bloodwork was done per protocol.  Dr. Terrace Arabia performed physical exam on patient. Study drug kit # [id number] (3 bottles, 12 tablets) was returned.  Study drug kit #s [id number] and [id number] (6 bottles, 210 tablets) were dispensed to patient.  He was informed of next visit 8 telephone call which is scheduled for 04/20/13.

## 2013-04-17 NOTE — Progress Notes (Signed)
HPI: [name] is a 57 year old male with no previous cardiac issues. He went to [facility] with shortness of breath and was found to be in pulmonary edema. He also complained of left-sided weakness. He was evaluated there, bedside echocardiogram showed Jacober motion abnormalities. He also had old changes of LVH and AS, possible acute ischemic changes. A code STEMI  was called,  he was transferred to [facility], taken to the catheterization lab.  The cardiac catheterization showed mild nonobstructive coronary artery disease. Acute respiratory failure and in ventilation was felt secondary to pulmonary edema. The patient was transferred to CCU. pulmonary critical care consult was called to manage his ventilator. They followed him closely and he was diuresed. He was extubated on 09/09/2011. The critical care medicine team also managed his antibiotics.  He was sedated while on the vent but then the sedation was discontinued. His blood sugars were managed with sliding scale insulin.  He was also started on Protonix and heparinized.   Because of the weakness a CT of the head was performed which was negative. A CT of the abdomen and chest was also performed which showed a 4.4 cm thoracic ascending aortic aneurysm but no evidence for an abdominal aortic aneurysm.   His echocardiogram showed an EF of 55% with severe LVH. He had pneumonia and was placed on antibiotics. The antibiotics were completed prior to discharge. He was hypokalemic with diuresis and his creatinine increased to 1.98. Medications were adjusted and his creatinine improved. He still has mild renal insufficiency. As his condition improved he was seen by cardiac rehabilitation. Physical therapy and occupational therapy were recommended.  neurology consult was called. Aspirin was felt the most appropriate anticoagulation. Carotid Dopplers showed no evidence of ICA stenosis.   Patient returned to the study site for Visit  in the ACCELERATE  trial.  He complains few weeks history of right ear pain, bilateral tympanic membrane was intact, there was no apparent right external ear canal inflammation, bilateral fingertips paresthesia, Most suggestive of bilateral carpal tunnel syndromes.  PHYSICAL EXAMINATOINS:  Generalized: In no acute distress  Neck: Supple, no carotid bruits   Cardiac: Regular rate rhythm  Pulmonary: Clear to auscultation bilaterally  Musculoskeletal: No deformity  Neurological examination  Mentation: Alert oriented to time, place, history taking, and causual conversation  Cranial nerve II-XII: Pupils were equal round reactive to light extraocular movements were full, visual field were full on confrontational test. facial sensation and strength were normal. hearing was intact to finger rubbing bilaterally. Uvula tongue midline.  head turning and shoulder shrug and were normal and symmetric.Tongue protrusion into cheek strength was normal. Bilateral tympanic membrane was intact   Motor: normal tone, bulk and strength  Sensory: mildly decreased fine touch, pinprick at right 1,2, 3rd fingers, preserved vibratory sensation, and proprioception at toes.  Coordination: Normal finger to nose, heel-to-shin bilaterally there was no truncal ataxia  Gait: Rising up from seated position without assistance, normal stance, without trunk ataxia, moderate stride, good arm swing, smooth turning, able to perform tiptoe, and heel walking without difficulty.   Romberg signs: Negative  Deep tendon reflexes: Brachioradialis 2/2, biceps 2/2, triceps 2/2, patellar 2/2, Achilles 2/2, plantar responses were flexor bilaterally.

## 2013-06-17 ENCOUNTER — Ambulatory Visit: Payer: Medicaid Other | Admitting: Cardiovascular Disease

## 2013-06-26 ENCOUNTER — Ambulatory Visit (INDEPENDENT_AMBULATORY_CARE_PROVIDER_SITE_OTHER): Payer: Medicaid Other | Admitting: Cardiovascular Disease

## 2013-06-26 ENCOUNTER — Encounter: Payer: Self-pay | Admitting: Cardiovascular Disease

## 2013-06-26 VITALS — BP 138/77 | HR 71 | Ht 71.0 in | Wt 244.0 lb

## 2013-06-26 DIAGNOSIS — I10 Essential (primary) hypertension: Secondary | ICD-10-CM

## 2013-06-26 DIAGNOSIS — R002 Palpitations: Secondary | ICD-10-CM

## 2013-06-26 DIAGNOSIS — I5032 Chronic diastolic (congestive) heart failure: Secondary | ICD-10-CM

## 2013-06-26 DIAGNOSIS — I2510 Atherosclerotic heart disease of native coronary artery without angina pectoris: Secondary | ICD-10-CM

## 2013-06-26 DIAGNOSIS — I712 Thoracic aortic aneurysm, without rupture: Secondary | ICD-10-CM

## 2013-06-26 NOTE — Patient Instructions (Signed)
   CT Angio of Chest with contrast   Lab for BMET - do today Office will contact with results via phone or letter.   Continue all current medications. Your physician wants you to follow up in:  1 year.  You will receive a reminder letter in the mail one-two months in advance.  If you don't receive a letter, please call our office to schedule the follow up appointment

## 2013-06-26 NOTE — Progress Notes (Signed)
Patient ID: Matthew Cain, male   DOB: Oct 21, 1956, 57 y.o.   MRN: [id number]    SUBJECTIVE: Matthew Cain is feeling well. He intermittently experiences palpitations. He walks 2-3 miles three days a week. He tries not to run, as it precipitates anxiety/panic similar to what he experienced at the time he was hospitalized for diastolic heart failure.  His mother died at 36 from an MI, and his father died at 67 of a hemorrhagic stroke. He denies chest pain and lightheadedness, as well as orthopnea and PND.  He is trying to lose weight.   Past Medical History   Diagnosis  Date   .  Hypertension    .  Chronic diastolic heart failure      EF 55-60%, grade 1 diastolic dysfunction; no WMAs, echo, 11/2011; s/p pulmo. edema c/b VDRF 11/12; Echo 09/08/11: severe LVH, no SAM, no LVOT gradient, EF 55%, mild BAE, mild reduced RVSF.   [name]  History of right ACA stroke      10/12 -- MRI 09/13/11: Acute right ACA infarct involving corpus callosum. MRA 09/13/11: severe intracranial ASD, right ACA occluded, left PCA and right MCA with severe disease. Dopplers were neg for significant ICA stenosis   .  CAD (coronary artery disease)      LHC 10/12: mild plaque in the LAD but no obstructive CAD   .  Diabetes mellitus      A1C 7.3 08/2011   .  CKD (chronic kidney disease)    .  HLD (hyperlipidemia)    .  Thoracic aortic aneurysm      CT in 10/12: 4.4 cm ascending thoracic aortic aneurysm    Past Surgical History   Procedure  Date   .  vasectomy      bilateral   .  Cardiac catheterization  09/09/11      Filed Vitals:   06/26/13 0811  BP: 138/77  Pulse: 71  Height: 5\' 11"  (1.803 m)  Weight: 244 lb (110.678 kg)     PHYSICAL EXAM General: NAD Neck: No JVD, no thyromegaly or thyroid nodule.  Lungs: Clear to auscultation bilaterally with normal respiratory effort. CV: Nondisplaced PMI.  Heart regular S1/S2, no S3/S4, no murmur.  No peripheral edema.  No carotid bruit.  Normal pedal pulses.  Abdomen: Soft,  nontender, no hepatosplenomegaly, no distention.  Neurologic: Alert and oriented x 3.  Psych: Normal affect. Extremities: No clubbing or cyanosis.     LABS: Basic Metabolic Panel: No results found for this basename: NA, K, CL, CO2, GLUCOSE, BUN, CREATININE, CALCIUM, MG, PHOS,  in the last 72 hours Liver Function Tests: No results found for this basename: AST, ALT, ALKPHOS, BILITOT, PROT, ALBUMIN,  in the last 72 hours No results found for this basename: LIPASE, AMYLASE,  in the last 72 hours CBC: No results found for this basename: WBC, NEUTROABS, HGB, HCT, MCV, PLT,  in the last 72 hours Cardiac Enzymes: No results found for this basename: CKTOTAL, CKMB, CKMBINDEX, TROPONINI,  in the last 72 hours BNP: No components found with this basename: POCBNP,  D-Dimer: No results found for this basename: DDIMER,  in the last 72 hours Hemoglobin A1C: No results found for this basename: HGBA1C,  in the last 72 hours Fasting Lipid Panel: No results found for this basename: CHOL, HDL, LDLCALC, TRIG, CHOLHDL, LDLDIRECT,  in the last 72 hours Thyroid Function Tests: No results found for this basename: TSH, T4TOTAL, FREET3, T3FREE, THYROIDAB,  in the last 72 hours Anemia Panel: No results  found for this basename: VITAMINB12, FOLATE, FERRITIN, TIBC, IRON, RETICCTPCT,  in the last 72 hours   ECHO: - Left ventricle: There was severe concentric hypertrophy. Systolic function was normal. The estimated ejection fraction was in the range of 55% to 60%. Alsop motion was normal; there were no regional Kainz motion abnormalities. Doppler parameters are consistent with abnormal left ventricular relaxation (grade 1 diastolic dysfunction). - Aortic valve: Valve area: 3.71cm^2(VTI). Valve area: 3.51cm^2 (Vmax). - Mitral valve: Calcified annulus. Mildly thickened leaflets .      ASSESSMENT AND PLAN: CAD (coronary artery disease), native coronary artery  Continue aggressive medical management. No  current indication for ischemic evaluation. Patient had nonobstructive CAD by cardiac catheterization, 08/2011.   Chronic diastolic heart failure  Euvolemic by history and exam. Continue current diuretic regimen, followed by [facility].   Palpitations  Quiescent on current medication regimen, which includes acebutolol.   HTN (hypertension)  Well-controlled on current medication regimen.  Thoracic aortic aneurysm 4.4 cm in October 2012. Will repeat chest CT angiogram.    Prentice Docker, M.D., F.A.C.C.

## 2013-07-01 ENCOUNTER — Telehealth: Payer: Self-pay | Admitting: *Deleted

## 2013-07-01 NOTE — Telephone Encounter (Signed)
Notes Recorded by Lesle Chris, LPN on 1/61/0960 at 10:25 AM Patient notified. Labs forwarded to PMD.

## 2013-07-01 NOTE — Telephone Encounter (Signed)
Message copied by Lesle Chris on Tue Jul 01, 2013 10:26 AM ------      Message from: Prentice Docker A      Created: Mon Jun 30, 2013  9:06 AM       Have results faxed to PCP, given pt's renal insufficiency (documented in past). ------

## 2013-07-21 ENCOUNTER — Telehealth: Payer: Self-pay | Admitting: Neurology

## 2013-07-21 NOTE — Telephone Encounter (Signed)
Patient was contacted for his Visit 8 follow-up in the ACCELERATE study. All required questions per the protocol was asked. There has been no changes in his medication and there is not AE's or SAE's to report. Patient was given appointment for his Visit 9 office follow-up.

## 2013-09-29 ENCOUNTER — Other Ambulatory Visit: Payer: Self-pay | Admitting: Physician Assistant

## 2013-10-22 ENCOUNTER — Encounter (INDEPENDENT_AMBULATORY_CARE_PROVIDER_SITE_OTHER): Payer: Self-pay

## 2013-10-22 DIAGNOSIS — Z0289 Encounter for other administrative examinations: Secondary | ICD-10-CM

## 2013-11-10 ENCOUNTER — Other Ambulatory Visit: Payer: Self-pay | Admitting: Physician Assistant

## 2013-12-09 ENCOUNTER — Telehealth: Payer: Self-pay | Admitting: *Deleted

## 2013-12-09 NOTE — Telephone Encounter (Signed)
Switch to lisinopril 10 mg daily.

## 2013-12-09 NOTE — Telephone Encounter (Signed)
Received fax from [facility] this morning.  Quinapril is now a non-preferred drug.  Preferred choices are Benazepril, Raptopril, Enalapril, Lisinopril, or Ramipril.  If you do not want to change to one of these choices, patient will need a prior authorization.  Please advise.

## 2013-12-10 MED ORDER — LISINOPRIL 10 MG PO TABS: 10.0000 mg | ORAL_TABLET | Freq: Every day | ORAL | Status: DC

## 2013-12-10 NOTE — Telephone Encounter (Signed)
[facility]

## 2014-02-19 ENCOUNTER — Other Ambulatory Visit: Payer: Self-pay | Admitting: Cardiology

## 2014-02-25 ENCOUNTER — Other Ambulatory Visit: Payer: Self-pay | Admitting: Cardiology

## 2014-04-17 ENCOUNTER — Ambulatory Visit (INDEPENDENT_AMBULATORY_CARE_PROVIDER_SITE_OTHER): Payer: Self-pay | Admitting: Neurology

## 2014-04-17 VITALS — BP 158/93 | HR 68 | Wt 239.4 lb

## 2014-04-17 DIAGNOSIS — Z0289 Encounter for other administrative examinations: Secondary | ICD-10-CM

## 2014-04-17 DIAGNOSIS — I639 Cerebral infarction, unspecified: Secondary | ICD-10-CM

## 2014-04-17 NOTE — Progress Notes (Signed)
Patient seen today for Accelerate Study Visit 11.  Conmeds and AEs were reviewed.Patient reports no changes in health or medications.   [id number] Health Questionnaire was completed. ECG and bloodwork was done per protocol. Dr. Krista Blue performed physical exam on patient. Study drug kit [phone] and [phone] (6 bottles, 0 tablets) were returned. Study drug kit #s [id number] and [id number] (6 bottles, 210 tablets) were dispensed to patient. He was informed of next visit 10 telephone call.

## 2014-06-11 ENCOUNTER — Ambulatory Visit: Payer: Medicaid Other | Admitting: Cardiovascular Disease

## 2014-06-12 ENCOUNTER — Ambulatory Visit (INDEPENDENT_AMBULATORY_CARE_PROVIDER_SITE_OTHER): Payer: Medicaid Other | Admitting: Cardiovascular Disease

## 2014-06-12 ENCOUNTER — Encounter: Payer: Self-pay | Admitting: Cardiovascular Disease

## 2014-06-12 VITALS — BP 138/82 | HR 59 | Ht 71.0 in | Wt 239.0 lb

## 2014-06-12 DIAGNOSIS — F419 Anxiety disorder, unspecified: Secondary | ICD-10-CM

## 2014-06-12 DIAGNOSIS — R002 Palpitations: Secondary | ICD-10-CM

## 2014-06-12 DIAGNOSIS — I2510 Atherosclerotic heart disease of native coronary artery without angina pectoris: Secondary | ICD-10-CM

## 2014-06-12 DIAGNOSIS — I712 Thoracic aortic aneurysm, without rupture, unspecified: Secondary | ICD-10-CM

## 2014-06-12 DIAGNOSIS — I10 Essential (primary) hypertension: Secondary | ICD-10-CM

## 2014-06-12 DIAGNOSIS — F411 Generalized anxiety disorder: Secondary | ICD-10-CM

## 2014-06-12 DIAGNOSIS — I5032 Chronic diastolic (congestive) heart failure: Secondary | ICD-10-CM

## 2014-06-12 NOTE — Patient Instructions (Signed)
Continue all current medications. Will need CTA of chest for thoracic aneurysm follow up in 6 months - will receive recall in the mail.   Your physician wants you to follow up in:  1 year.  You will receive a reminder letter in the mail one-two months in advance.  If you don't receive a letter, please call our office to schedule the follow up appointment

## 2014-06-12 NOTE — Progress Notes (Signed)
Patient ID: Matthew Cain, male   DOB: 1956/09/16, 58 y.o.   MRN: [id number]      SUBJECTIVE: The patient is a 58 year old male who is here for followup for nonobstructive coronary artery disease, thoracic aortic aneurysm, chronic diastolic heart failure, palpitations, and essential hypertension. ECG performed in the office today demonstrates sinus bradycardia with a nonspecific ST segment and T wave abnormality in inferolateral leads. This is not markedly different from an ECG performed on 07/17/2012. CT chest from August 2014 showed stability of his thoracic aortic aneurysm at 4.3 cm. He very seldom has chest tightness which has been completely alleviated taking fish oil and modifying his diet. He occasionally experiences palpitations only at night and is related to higher sodium intake. He occasionally experiences shortness of breath with exertion. He does say that both Celexa and Klonopin have helped him to stay calm. He is trying to exercise more and lose weight. He was hospitalized for diastolic heart failure in October 2012 and underwent coronary angiography on 09/09/2011 which demonstrated mild nonobstructive coronary artery disease. He is applying for disability. He obtained blood work at the health department earlier this morning.  Review of Systems: As per "subjective", otherwise negative.  No Known Allergies  Current Outpatient Prescriptions  Medication Sig Dispense Refill  . acebutolol (SECTRAL) 200 MG capsule TAKE ONE CAPSULE BY MOUTH TWICE DAILY  60 capsule  6  . amLODipine (NORVASC) 10 MG tablet Take 1 tablet (10 mg total) by mouth daily.  30 tablet  6  . aspirin 81 MG EC tablet Take 81 mg by mouth daily.      [name] atorvastatin (LIPITOR) 40 MG tablet Take 1 tablet (40 mg total) by mouth every evening.  30 tablet  6  . cetirizine (ZYRTEC) 10 MG tablet Take 10 mg by mouth daily.      . cholecalciferol (VITAMIN D) 1000 UNITS tablet Take 1,000 Units by mouth daily.      . Cimetidine  (ACID REDUCER PO) Take 150 mg by mouth daily.      . citalopram (CELEXA) 20 MG tablet Take 20 mg by mouth every morning.       . clonazePAM (KLONOPIN) 0.5 MG tablet Take 0.5 mg by mouth 2 (two) times daily.      . Digestive Enzymes (PAPAYA AND ENZYMES) CHEW Chew 4 tablets by mouth daily before supper.        . ferrous sulfate (IRON SUPPLEMENT) 325 (65 FE) MG tablet Take 325 mg by mouth 3 (three) times a week.       [name] FIBER SELECT GUMMIES PO Take 1 capsule by mouth daily.      . fluticasone (FLONASE) 50 MCG/ACT nasal spray Place 1 spray into the nose 2 (two) times daily.      . furosemide (LASIX) 40 MG tablet TAKE ONE TABLET BY MOUTH TWICE DAILY  60 tablet  6  . glipiZIDE (GLUCOTROL XL) 5 MG 24 hr tablet Take 10 mg by mouth daily.      [name] ibuprofen (ADVIL,MOTRIN) 800 MG tablet Take 800 mg by mouth every 8 (eight) hours as needed for pain.      . isosorbide mononitrate (IMDUR) 30 MG 24 hr tablet Take 1 tablet (30 mg total) by mouth daily.  30 tablet  6  . lisinopril (PRINIVIL,ZESTRIL) 10 MG tablet Take 1 tablet (10 mg total) by mouth daily.  30 tablet  6  . metFORMIN (GLUCOPHAGE) 500 MG tablet Take 1,000 mg by mouth 2 (two) times daily with  a meal.       . NASAL SPRAY SALINE NA Place 1 spray into the nose at bedtime.      . nitroGLYCERIN (NITROSTAT) 0.4 MG SL tablet Place 1 tablet (0.4 mg total) under the tongue every 5 (five) minutes as needed for chest pain.  25 tablet  3  . NON FORMULARY Take 1 tablet by mouth 2 (two) times daily. Multi verified Dietary supplement for weight loss      . Omega-3 Fatty Acids (FISH OIL PO) Take 1 tablet by mouth daily.      . pantoprazole (PROTONIX) 40 MG tablet Take 40 mg by mouth daily.      [name] spironolactone (ALDACTONE) 25 MG tablet TAKE ONE TABLET BY MOUTH ONCE DAILY  30 tablet  6  . vitamin B-12 (CYANOCOBALAMIN) 1000 MCG tablet Take 1,000 mcg by mouth daily.      . quinapril (ACCUPRIL) 10 MG tablet Take 1 tablet (10 mg total) by mouth daily.  30 tablet  6    No current facility-administered medications for this visit.    Past Medical History  Diagnosis Date  . Hypertension   . Chronic diastolic heart failure     EF 16-10%, grade 1 diastolic dysfunction; no WMAs, echo, 11/2011; s/p pulmo. edema c/b VDRF 11/12; Echo 09/08/11: severe LVH, no SAM, no LVOT gradient, EF 55%, mild BAE, mild reduced RVSF.  [name] History of right ACA stroke     10/12  --  MRI 09/13/11:  Acute right ACA infarct involving corpus callosum.  MRA 09/13/11: severe intracranial ASD, right ACA occluded, left PCA and right MCA with severe disease.  Dopplers were neg for significant ICA stenosis  . CAD (coronary artery disease)     LHC 10/12:  mild plaque in the LAD but no obstructive CAD  . Diabetes mellitus     A1C 7.3 08/2011  . CKD (chronic kidney disease)   . HLD (hyperlipidemia)   . Thoracic aortic aneurysm     CT in 10/12: 4.4 cm ascending thoracic aortic aneurysm    Past Surgical History  Procedure Laterality Date  .  vasectomy      bilateral  . Cardiac catheterization  09/09/11    History   Social History  . Marital Status: Divorced    Spouse Name: N/A    Number of Children: N/A  . Years of Education: N/A   Occupational History  . Not on file.   Social History Main Topics  . Smoking status: Former Smoker -- 1.00 packs/day for 30 years    Types: Cigarettes    Quit date: 11/13/1994  . Smokeless tobacco: Never Used  . Alcohol Use: No  . Drug Use: No  . Sexual Activity: Not Currently   Other Topics Concern  . Not on file   Social History Narrative  . No narrative on file     Filed Vitals:   06/12/14 0948  BP: 138/82  Pulse: 59  Height: 5\' 11"  (1.803 m)  Weight: 239 lb (108.41 kg)    PHYSICAL EXAM General: NAD Neck: No JVD, no thyromegaly. Lungs: Clear to auscultation bilaterally with normal respiratory effort. CV: Nondisplaced PMI.  Regular rate and rhythm, normal S1/S2, no S3/S4, no murmur. No pretibial or periankle edema.  No carotid  bruit.  Normal pedal pulses.  Abdomen: Soft, nontender, no hepatosplenomegaly, no distention.  Neurologic: Alert and oriented x 3.  Psych: Normal affect. Extremities: No clubbing or cyanosis.   ECG: reviewed and available in electronic records.  ASSESSMENT AND PLAN:  CAD (coronary artery disease), native coronary artery  Continue aggressive medical management. No current indication for ischemic evaluation. Patient had nonobstructive CAD by cardiac catheterization, 08/2011. ECG markedly unchanged when compared to September 2013. I provided him with exercise counseling as well, encouraging him to walk 30 minutes at least 5 days a week.  Chronic diastolic heart failure  Euvolemic by history and exam. Continue current diuretic regimen, followed by his primary team at the health department.   Palpitations  Quiescent on current medication regimen, which includes acebutolol. Obtain copy of blood tests.  HTN (hypertension)  Well-controlled on current medication regimen.   Thoracic aortic aneurysm  4.3 cm in August 2014 by CT angiogram. Will repeat in 6 months.  Dispo: f/u 1 year.  Prentice DockerSuresh Koneswaran, M.D., F.A.C.C.

## 2014-06-22 ENCOUNTER — Other Ambulatory Visit: Payer: Self-pay | Admitting: *Deleted

## 2014-06-22 MED ORDER — ACEBUTOLOL HCL 200 MG PO CAPS: ORAL_CAPSULE | ORAL | Status: DC

## 2014-06-22 MED ORDER — FUROSEMIDE 40 MG PO TABS: ORAL_TABLET | ORAL | Status: DC

## 2014-06-26 ENCOUNTER — Ambulatory Visit: Payer: Medicaid Other | Admitting: Cardiovascular Disease

## 2014-08-24 ENCOUNTER — Telehealth: Payer: Self-pay | Admitting: Neurology

## 2014-08-24 NOTE — Telephone Encounter (Signed)
Called patient and instructed him to stop taking the Accelerate study drug as of today because the sponsor is terminating the study. Patient understood.

## 2014-08-27 ENCOUNTER — Encounter (INDEPENDENT_AMBULATORY_CARE_PROVIDER_SITE_OTHER): Payer: Self-pay

## 2014-08-27 ENCOUNTER — Ambulatory Visit (INDEPENDENT_AMBULATORY_CARE_PROVIDER_SITE_OTHER): Payer: Self-pay | Admitting: Neurology

## 2014-08-27 VITALS — BP 164/99 | HR 82 | Wt 239.0 lb

## 2014-08-27 DIAGNOSIS — Z0289 Encounter for other administrative examinations: Secondary | ICD-10-CM

## 2014-08-27 DIAGNOSIS — Z006 Encounter for examination for normal comparison and control in clinical research program: Secondary | ICD-10-CM

## 2014-08-27 NOTE — Progress Notes (Signed)
Patient was seen today for Visit 19, End of Study in the Accelerate Research Trial.  Conmeds and AEs were reviewed with patient.  Vital signs, EKG and Labwork was done.  Dr. Pearlean BrownieSethi completed a physical exam on this patient.  Patient had no questions or no complaints.  Informed patient that I will call him in 30 days for a telephone f/u call.

## 2014-08-27 NOTE — Progress Notes (Signed)
I examined this patient   Physical Exam ;.Awake alert. Afebrile. Head is nontraumatic. Neck is supple without bruit. Hearing is normal. Cardiac exam no murmur or gallop. Lungs are clear to auscultation. Distal pulses are well felt. Neurological Exam ;  Awake  Alert oriented x 3. Normal speech and language.eye movements full without nystagmus.fundi were not visualized. Vision acuity and fields appear normal. Hearing is normal. Palatal movements are normal. Face symmetric. Tongue midline. Normal strength, tone, reflexes and coordination. Normal sensation. Gait deferred.

## 2014-09-21 ENCOUNTER — Telehealth: Payer: Self-pay | Admitting: Cardiovascular Disease

## 2014-09-21 NOTE — Telephone Encounter (Signed)
Think that it is a doughnut hole issue

## 2014-09-21 NOTE — Telephone Encounter (Signed)
acebutolol (SECTRAL) 200 MG capsule 60 capsule 6 06/22/2014     Sig: TAKE ONE CAPSULE BY MOUTH TWICE DAILY   [facility] - He is on his last pill today Bottle says he has 4 refills left, but [facility] told him they could not fill due to insurance

## 2014-09-25 NOTE — Telephone Encounter (Signed)
Stated that he had already stopped the Quinapril.  Stated it had been changed to cheaper Lisinopril.  Instructed to remain off the Quinapril & make the increase as stated below.  Will f/u with patient on Monday, 09/28/14.   BP currently - 165/88  82.

## 2014-09-25 NOTE — Telephone Encounter (Signed)
Can you please confirm what meds he is taking for HTN? Is he on two ACEI's? He wants to stop acebutolol.

## 2014-09-25 NOTE — Telephone Encounter (Signed)
Patient is completely out of his acebutolol medication.  He would like to change to a new medication to avoid having to get this medication prior authorized. Please advise.

## 2014-09-25 NOTE — Telephone Encounter (Signed)
Stop quinapril and take lisinopril 20 mg bid. Have him take a total of 20 mg this evening.

## 2014-09-25 NOTE — Telephone Encounter (Signed)
This is not an issue of being in the doughnut hole.  [place] Medicaid formulary changed 09/13/14.  Initial prior authorization faxed on 09/22/14 at 1:16 & faxed again today at 12:35.  [facility] to [facility] since I have not received any information back.  All info given over the phone to pharm.  Also, paper work that is faxed into them may not show up in their system for 2-3 days.  Pharm give a pending PA # - [id number], but this may not be finalized for another 24 hours.   Medication list reviewed with patient & updated.  He is on two ACE's.  Suggestion was made for patient to purchase a few tablets to get him through the weekend & till we can get this issue resolved.  Stated that he did not think he could do that as he has no income.  Is currently trying for disability.  Will send message to provider for advice.  Also, will see if there may be one of the medications that he has already at home that he can take extra if his BP spikes over the weekend.

## 2014-09-29 NOTE — Telephone Encounter (Signed)
[facility]  Stated patient picked up his Acebutolol 200mg  on Friday night (09/25/2014).  Medicaid approved & he was able to get for $3.00 co-pay.    Informed patient to remain off the Quinapril, go back to his Lisinopril 10mg  daily, and to continue his Acebutolol as previously prescribed at 200mg  twice a day.  (Started this back Saturday morning.) Patient states he is feeling better today.

## 2014-10-02 ENCOUNTER — Other Ambulatory Visit: Payer: Self-pay | Admitting: *Deleted

## 2014-10-02 MED ORDER — SPIRONOLACTONE 25 MG PO TABS: 25.0000 mg | ORAL_TABLET | Freq: Every day | ORAL | Status: DC

## 2014-11-18 ENCOUNTER — Other Ambulatory Visit: Payer: Self-pay | Admitting: *Deleted

## 2014-11-18 DIAGNOSIS — I712 Thoracic aortic aneurysm, without rupture, unspecified: Secondary | ICD-10-CM

## 2014-11-18 DIAGNOSIS — Z01812 Encounter for preprocedural laboratory examination: Secondary | ICD-10-CM

## 2014-11-23 ENCOUNTER — Telehealth: Payer: Self-pay | Admitting: *Deleted

## 2014-11-23 NOTE — Telephone Encounter (Signed)
Pt made aware, forwarded to Dr. Muse 

## 2014-11-23 NOTE — Telephone Encounter (Signed)
-----   Message from Laqueta LindenSuresh A Koneswaran, MD sent at 11/20/2014  5:34 PM EST ----- Ok.

## 2014-11-30 ENCOUNTER — Ambulatory Visit (HOSPITAL_COMMUNITY)
Admission: RE | Admit: 2014-11-30 | Discharge: 2014-11-30 | Disposition: A | Discharge status: Home / Self Care | Payer: Medicaid Other | Source: Ambulatory Visit | Attending: Cardiovascular Disease | Admitting: Cardiovascular Disease

## 2014-11-30 DIAGNOSIS — I712 Thoracic aortic aneurysm, without rupture, unspecified: Secondary | ICD-10-CM

## 2014-11-30 DIAGNOSIS — K760 Fatty (change of) liver, not elsewhere classified: Secondary | ICD-10-CM | POA: Diagnosis not present

## 2014-11-30 MED ORDER — IOHEXOL 350 MG/ML SOLN
100.0000 mL | Freq: Once | INTRAVENOUS | Status: AC | PRN
  Administered 2014-11-30: 100 mL via INTRAVENOUS

## 2014-12-01 ENCOUNTER — Telehealth: Payer: Self-pay | Admitting: *Deleted

## 2014-12-01 NOTE — Telephone Encounter (Signed)
CX 12/04/14 APPT. New Recalled placed for July 2016. Pt made aware and will f/u with Dr. Ledell PeoplesMuse.

## 2014-12-01 NOTE — Telephone Encounter (Signed)
Pt made aware, forwarded to Dr. Ledell PeoplesMuse.

## 2014-12-01 NOTE — Telephone Encounter (Signed)
-----   Message from Laqueta LindenSuresh A Koneswaran, MD sent at 12/01/2014 12:53 PM EST ----- Yes, move to 05/2015. Thx.  ----- Message -----    From: Albertine PatriciaStaci T Priscella Donna, CMA    Sent: 12/01/2014  12:49 PM      To: Laqueta LindenSuresh A Koneswaran, MD  Result of CT ANGIO note states pt needs to f/u with pcp or GI. Pt is scheduled to come Friday to see you, LOV 05/2014 states f/u in 1 year. Should we move this appt to 05/2015 or do you want to see him Friday? Pt is aware of results.   Thanks,  [employer] Mutual[employer]aci

## 2014-12-01 NOTE — Telephone Encounter (Signed)
-----   Message from Laqueta LindenSuresh A Koneswaran, MD sent at 12/01/2014  8:47 AM EST ----- Thoracic aneurysm is stable. Needs to f/u with PCP or GI for advanced hepatic steatosis.

## 2014-12-04 ENCOUNTER — Ambulatory Visit: Payer: Medicaid Other | Admitting: Cardiovascular Disease

## 2015-01-23 ENCOUNTER — Other Ambulatory Visit: Payer: Self-pay | Admitting: Cardiovascular Disease

## 2015-05-07 ENCOUNTER — Other Ambulatory Visit: Payer: Self-pay | Admitting: Cardiovascular Disease

## 2015-05-11 ENCOUNTER — Other Ambulatory Visit: Payer: Self-pay | Admitting: *Deleted

## 2015-05-11 MED ORDER — FUROSEMIDE 40 MG PO TABS: ORAL_TABLET | ORAL | Status: DC

## 2015-05-14 ENCOUNTER — Ambulatory Visit (INDEPENDENT_AMBULATORY_CARE_PROVIDER_SITE_OTHER): Payer: Medicaid Other | Admitting: Cardiovascular Disease

## 2015-05-14 ENCOUNTER — Encounter: Payer: Self-pay | Admitting: Cardiovascular Disease

## 2015-05-14 VITALS — BP 158/98 | HR 73 | Ht 71.0 in | Wt 239.0 lb

## 2015-05-14 DIAGNOSIS — I10 Essential (primary) hypertension: Secondary | ICD-10-CM

## 2015-05-14 DIAGNOSIS — R002 Palpitations: Secondary | ICD-10-CM

## 2015-05-14 DIAGNOSIS — I5032 Chronic diastolic (congestive) heart failure: Secondary | ICD-10-CM

## 2015-05-14 DIAGNOSIS — Z01818 Encounter for other preprocedural examination: Secondary | ICD-10-CM

## 2015-05-14 DIAGNOSIS — I2510 Atherosclerotic heart disease of native coronary artery without angina pectoris: Secondary | ICD-10-CM | POA: Diagnosis not present

## 2015-05-14 DIAGNOSIS — I712 Thoracic aortic aneurysm, without rupture, unspecified: Secondary | ICD-10-CM

## 2015-05-14 DIAGNOSIS — F419 Anxiety disorder, unspecified: Secondary | ICD-10-CM

## 2015-05-14 MED ORDER — LISINOPRIL 20 MG PO TABS: 20.0000 mg | ORAL_TABLET | Freq: Every day | ORAL | Status: DC

## 2015-05-14 NOTE — Patient Instructions (Signed)
   Increase Lisinopril to  daily - may take 2 tabs of your  tablet daily till finish current supply.  New sent to pharmacy today. Continue all other medications.   Your physician wants you to follow up in:  1 year.  You will receive a reminder letter in the mail one-two months in advance.  If you don't receive a letter, please call our office to schedule the follow up appointment

## 2015-05-14 NOTE — Progress Notes (Signed)
Patient ID: Matthew Overman., male   DOB: March 07, 1956, 59 y.o.   MRN: [id number]      SUBJECTIVE: The patient returns for followup for nonobstructive coronary artery disease, thoracic aortic aneurysm, chronic diastolic heart failure, palpitations, and essential hypertension.  ECG performed in the office today demonstrates sinus rhythm with a nonspecific ST segment and T wave abnormality in inferolateral leads. This is not markedly different from an ECG performed on 06/12/2014 and 2013.  CT chest from January 2016 showed stability of his thoracic aortic aneurysm at 4.3 cm.  He was hospitalized for diastolic heart failure in October 2012 and underwent coronary angiography on 09/09/2011 which demonstrated mild nonobstructive coronary artery disease.  He denies exertional chest pain. He may get short of breath when climbing stairs but this is unchanged in the past few years. He denies leg swelling. He recently fell and injured his left shoulder and left arm and requires surgery. He also describes needing surgery for a testicular problem as well as his prostate.   He had been having high blood pressure readings but realized he ran out of a medication which he is now taking. His blood pressure before falling and injuring his shoulder was 135-140/88. Prior to restarting an unfilled medication, his blood pressure was 202/108.   He is feeling well. Echocardiogram performed on 11/30/11 demonstrated normal left ventricular systolic function, LVEF 55-60%, and grade 1 diastolic dysfunction.   Review of Systems: As per "subjective", otherwise negative.  No Known Allergies  Current Outpatient Prescriptions  Medication Sig Dispense Refill  . acebutolol (SECTRAL) 200 MG capsule TAKE ONE CAPSULE BY MOUTH TWICE DAILY 60 capsule 3  . amLODipine (NORVASC) 10 MG tablet Take 1 tablet (10 mg total) by mouth daily. 30 tablet 6  . aspirin 81 MG EC tablet Take 81 mg by mouth daily.    [name] atorvastatin (LIPITOR) 40 MG  tablet Take 1 tablet (40 mg total) by mouth every evening. 30 tablet 6  . cholecalciferol (VITAMIN D) 1000 UNITS tablet Take 1,000 Units by mouth daily.    . Cimetidine (ACID REDUCER PO) Take 150 mg by mouth daily.    . citalopram (CELEXA) 20 MG tablet Take 20 mg by mouth every morning.     . clonazePAM (KLONOPIN) 0.5 MG tablet Take 0.5 mg by mouth 2 (two) times daily.    . ferrous sulfate (IRON SUPPLEMENT) 325 (65 FE) MG tablet Take 325 mg by mouth 3 (three) times a week.     [name] FIBER SELECT GUMMIES PO Take 1 capsule by mouth daily.    . fluticasone (FLONASE) 50 MCG/ACT nasal spray Place 1 spray into the nose 2 (two) times daily.    . furosemide (LASIX) 40 MG tablet TAKE ONE TABLET BY MOUTH TWICE DAILY 60 tablet 6  . glipiZIDE (GLUCOTROL XL) 5 MG 24 hr tablet Take 10 mg by mouth daily.    [name] ibuprofen (ADVIL,MOTRIN) 800 MG tablet Take 800 mg by mouth every 8 (eight) hours as needed for pain.    . isosorbide mononitrate (IMDUR) 30 MG 24 hr tablet Take 1 tablet (30 mg total) by mouth daily. 30 tablet 6  . lisinopril (PRINIVIL,ZESTRIL) 10 MG tablet Take 1 tablet (10 mg total) by mouth daily. 30 tablet 6  . metFORMIN (GLUCOPHAGE) 500 MG tablet Take 1,000 mg by mouth 2 (two) times daily with a meal.     . NASAL SPRAY SALINE NA Place 1 spray into the nose at bedtime.    . nitroGLYCERIN (  NITROSTAT) 0.4 MG SL tablet Place 1 tablet (0.4 mg total) under the tongue every 5 (five) minutes as needed for chest pain. 25 tablet 3  . Omega-3 Fatty Acids (FISH OIL PO) Take 1 tablet by mouth daily.    . pantoprazole (PROTONIX) 40 MG tablet Take 40 mg by mouth daily.    [name]. spironolactone (ALDACTONE) 25 MG tablet TAKE ONE TABLET BY MOUTH ONCE DAILY 30 tablet 3  . vitamin B-12 (CYANOCOBALAMIN) 1000 MCG tablet Take 1,000 mcg by mouth daily.     No current facility-administered medications for this visit.    Past Medical History  Diagnosis Date  . Hypertension   . Chronic diastolic heart failure     EF 40-98%55-60%,  grade 1 diastolic dysfunction; no WMAs, echo, 11/2011; s/p pulmo. edema c/b VDRF 11/12; Echo 09/08/11: severe LVH, no SAM, no LVOT gradient, EF 55%, mild BAE, mild reduced RVSF.  [name]. History of right ACA stroke     10/12  --  MRI 09/13/11:  Acute right ACA infarct involving corpus callosum.  MRA 09/13/11: severe intracranial ASD, right ACA occluded, left PCA and right MCA with severe disease.  Dopplers were neg for significant ICA stenosis  . CAD (coronary artery disease)     LHC 10/12:  mild plaque in the LAD but no obstructive CAD  . Diabetes mellitus     A1C 7.3 08/2011  . CKD (chronic kidney disease)   . HLD (hyperlipidemia)   . Thoracic aortic aneurysm     CT in 10/12: 4.4 cm ascending thoracic aortic aneurysm    Past Surgical History  Procedure Laterality Date  .  vasectomy      bilateral  . Cardiac catheterization  09/09/11    History   Social History  . Marital Status: Divorced    Spouse Name: N/A  . Number of Children: N/A  . Years of Education: N/A   Occupational History  . Not on file.   Social History Main Topics  . Smoking status: Former Smoker -- 1.00 packs/day for 30 years    Types: Cigarettes    Quit date: 11/13/1994  . Smokeless tobacco: Never Used  . Alcohol Use: No  . Drug Use: No  . Sexual Activity: Not Currently   Other Topics Concern  . Not on file   Social History Narrative     Filed Vitals:   05/14/15 0834  BP: 158/98  Pulse: 73  Height: 5\' 11"  (1.803 m)  Weight: 239 lb (108.41 kg)  SpO2: 94%    PHYSICAL EXAM General: NAD HEENT: Normal. Neck: No JVD, no thyromegaly. Lungs: Clear to auscultation bilaterally with normal respiratory effort. CV: Nondisplaced PMI.  Regular rate and rhythm, normal S1/S2, no S3/S4, no murmur. No pretibial or periankle edema.  No carotid bruit.  Normal pedal pulses.  Abdomen: Soft, nontender, no hepatosplenomegaly, no distention.  Neurologic: Alert and oriented x 3.  Psych: Normal affect. Skin:  Normal. Musculoskeletal: Normal range of motion, no gross deformities. Extremities: No clubbing or cyanosis.   ECG: Most recent ECG reviewed.      ASSESSMENT AND PLAN: CAD (coronary artery disease), native coronary artery  Continue aggressive medical management. No current indication for ischemic evaluation. Patient had nonobstructive CAD by cardiac catheterization, 08/2011. ECG markedly unchanged when compared to July 2015 and September 2013.  Chronic diastolic heart failure  Euvolemic by history and exam. Continue current diuretic regimen, followed by his primary team at the health department.   Palpitations  Quiescent on current medication  regimen, which includes acebutolol.   Essential hypertension Elevated. Increase lisinopril to 20 mg daily.   Thoracic aortic aneurysm  4.3 cm in January 2016 by CT angiogram. Will repeat in 6 months.  Dispo: f/u 1 year. I feel he is at low risk for a major adverse cardiac event in the perioperative period with respect to planned surgery.   Prentice Docker, M.D., F.A.C.C.

## 2015-05-31 ENCOUNTER — Telehealth: Payer: Self-pay | Admitting: Cardiovascular Disease

## 2015-05-31 NOTE — Telephone Encounter (Signed)
I faxed Dr Junius ArgyleKoneswaran's office note of 05/14/15 to Dr Andreas BlowerMortenson's office Called patient and LM that I faxed it

## 2015-05-31 NOTE — Telephone Encounter (Signed)
Patient walked in. He had been to Dr Andreas BlowerMortenson's office and they have not received surgical clearance from us yet. His surgery being scheduled is determined by our clearance.   Dr Chaney MallingMortenson Office  [phone] Fax     [phone]

## 2015-06-07 ENCOUNTER — Ambulatory Visit: Payer: Medicaid Other | Admitting: Cardiovascular Disease

## 2015-06-11 ENCOUNTER — Other Ambulatory Visit: Payer: Self-pay | Admitting: Cardiovascular Disease

## 2015-06-14 ENCOUNTER — Ambulatory Visit: Payer: Medicaid Other | Admitting: Physician Assistant

## 2015-06-23 ENCOUNTER — Other Ambulatory Visit: Payer: Self-pay | Admitting: Urology

## 2015-07-13 NOTE — Patient Instructions (Addendum)
YOUR PROCEDURE IS SCHEDULED ON :  07/20/15  REPORT TO [facility] MAIN ENTRANCE FOLLOW SIGNS TO EAST ELEVATOR - GO TO 3rd FLOOR CHECK IN AT 3 EAST NURSES STATION (SHORT STAY) AT:  5:30 AM  CALL THIS NUMBER IF YOU HAVE PROBLEMS THE MORNING OF SURGERY [phone]  REMEMBER:ONLY 1 PER PERSON MAY GO TO SHORT STAY WITH YOU TO GET READY THE MORNING OF YOUR SURGERY  DO NOT EAT FOOD OR DRINK LIQUIDS AFTER MIDNIGHT  TAKE THESE MEDICINES THE MORNING OF SURGERY: PANTOPRAZOLE / AMLODIPINE / ZYRTEC / ISOSORBIDE / CITALOPRAM / BUSPAR / ACEBUTOLOL  YOU MAY NOT HAVE ANY METAL ON YOUR BODY INCLUDING HAIR PINS AND PIERCING'S. DO NOT WEAR JEWELRY, MAKEUP, LOTIONS, POWDERS OR PERFUMES. DO NOT WEAR NAIL POLISH. DO NOT SHAVE 48 HRS PRIOR TO SURGERY. MEN MAY SHAVE FACE AND NECK.  DO NOT BRING VALUABLES TO HOSPITAL. [facility] IS NOT RESPONSIBLE FOR VALUABLES.  CONTACTS, DENTURES OR PARTIALS MAY NOT BE WORN TO SURGERY. LEAVE SUITCASE IN CAR. CAN BE BROUGHT TO ROOM AFTER SURGERY.  PATIENTS DISCHARGED THE DAY OF SURGERY WILL NOT BE ALLOWED TO DRIVE HOME.  PLEASE READ OVER THE FOLLOWING INSTRUCTION SHEETS _________________________________________________________________________________                                          [facility] - PREPARING FOR SURGERY  Before surgery, you can play an important role.  Because skin is not sterile, your skin needs to be as free of germs as possible.  You can reduce the number of germs on your skin by washing with CHG (chlorahexidine gluconate) soap before surgery.  CHG is an antiseptic cleaner which kills germs and bonds with the skin to continue killing germs even after washing. Please DO NOT use if you have an allergy to CHG or antibacterial soaps.  If your skin becomes reddened/irritated stop using the CHG and inform your nurse when you arrive at Short Stay. Do not shave (including legs and underarms) for at least 48 hours prior to the first CHG  shower.  You may shave your face. Please follow these instructions carefully:   1.  Shower with CHG Soap the night before surgery and the  morning of Surgery.   2.  If you choose to wash your hair, wash your hair first as usual with your  normal  Shampoo.   3.  After you shampoo, rinse your hair and body thoroughly to remove the  shampoo.                                         4.  Use CHG as you would any other liquid soap.  You can apply chg directly  to the skin and wash . Gently wash with scrungie or clean wascloth    5.  Apply the CHG Soap to your body ONLY FROM THE NECK DOWN.   Do not use on open                           Wound or open sores. Avoid contact with eyes, ears mouth and genitals (private parts).  Genitals (private parts) with your normal soap.              6.  Wash thoroughly, paying special attention to the area where your surgery  will be performed.   7.  Thoroughly rinse your body with warm water from the neck down.   8.  DO NOT shower/wash with your normal soap after using and rinsing off  the CHG Soap .                9.  Pat yourself dry with a clean towel.             10.  Wear clean night clothes to bed after shower             11.  Place clean sheets on your bed the night of your first shower and do not  sleep with pets.  Day of Surgery : Do not apply any lotions/deodorants the morning of surgery.  Please wear clean clothes to the hospital/surgery center.  FAILURE TO FOLLOW THESE INSTRUCTIONS MAY RESULT IN THE CANCELLATION OF YOUR SURGERY    PATIENT SIGNATURE_________________________________  ______________________________________________________________________

## 2015-07-14 ENCOUNTER — Encounter (HOSPITAL_COMMUNITY)
Admission: RE | Admit: 2015-07-14 | Discharge: 2015-07-14 | Disposition: A | Discharge status: Home / Self Care | Payer: Medicaid Other | Source: Ambulatory Visit | Attending: Urology | Admitting: Urology

## 2015-07-14 ENCOUNTER — Encounter (HOSPITAL_COMMUNITY): Payer: Self-pay

## 2015-07-14 DIAGNOSIS — Z01818 Encounter for other preprocedural examination: Secondary | ICD-10-CM | POA: Insufficient documentation

## 2015-07-14 DIAGNOSIS — N400 Enlarged prostate without lower urinary tract symptoms: Secondary | ICD-10-CM | POA: Insufficient documentation

## 2015-07-14 DIAGNOSIS — N4340 Spermatocele of epididymis, unspecified: Secondary | ICD-10-CM | POA: Diagnosis not present

## 2015-07-14 HISTORY — DX: Anxiety disorder, unspecified: F41.9

## 2015-07-14 HISTORY — DX: Reserved for inherently not codable concepts without codable children: IMO0001

## 2015-07-14 HISTORY — DX: Acute myocardial infarction, unspecified: I21.9

## 2015-07-14 HISTORY — DX: Spermatocele of epididymis, unspecified: N43.40

## 2015-07-14 HISTORY — DX: Complete traumatic metacarpophalangeal amputation of unspecified finger, initial encounter: S68.119A

## 2015-07-14 HISTORY — DX: Ankyloglossia: Q38.1

## 2015-07-14 HISTORY — DX: Depression, unspecified: F32.A

## 2015-07-14 HISTORY — DX: Major depressive disorder, single episode, unspecified: F32.9

## 2015-07-14 HISTORY — DX: Personal history of other medical treatment: Z92.89

## 2015-07-14 HISTORY — DX: Gastro-esophageal reflux disease without esophagitis: K21.9

## 2015-07-14 HISTORY — DX: Personal history of urinary calculi: Z87.442

## 2015-07-14 HISTORY — DX: Constipation, unspecified: K59.00

## 2015-07-14 LAB — BASIC METABOLIC PANEL
Anion gap: 11 (ref 5–15)
BUN: 22 mg/dL — ABNORMAL HIGH (ref 6–20)
CO2: 26 mmol/L (ref 22–32)
Calcium: 9.3 mg/dL (ref 8.9–10.3)
Chloride: 102 mmol/L (ref 101–111)
Creatinine, Ser: 1.46 mg/dL — ABNORMAL HIGH (ref 0.61–1.24)
GFR calc Af Amer: 59 mL/min — ABNORMAL LOW (ref >60)
GFR calc non Af Amer: 51 mL/min — ABNORMAL LOW (ref >60)
Glucose, Bld: 145 mg/dL — ABNORMAL HIGH (ref 65–99)
Potassium: 4.2 mmol/L (ref 3.5–5.1)
Sodium: 139 mmol/L (ref 135–145)

## 2015-07-14 LAB — CBC
HCT: 41.5 % (ref 39.0–52.0)
Hemoglobin: 13.6 g/dL (ref 13.0–17.0)
MCH: 29.2 pg (ref 26.0–34.0)
MCHC: 32.8 g/dL (ref 30.0–36.0)
MCV: 89.1 fL (ref 78.0–100.0)
Platelets: 408 10*3/uL — ABNORMAL HIGH (ref 150–400)
RBC: 4.66 MIL/uL (ref 4.22–5.81)
RDW: 13.2 % (ref 11.5–15.5)
WBC: 8.3 10*3/uL (ref 4.0–10.5)

## 2015-07-14 NOTE — Progress Notes (Signed)
   07/14/15 1003  OBSTRUCTIVE SLEEP APNEA  Have you ever been diagnosed with sleep apnea through a sleep study? No  Do you snore loudly (loud enough to be heard through closed doors)?  1  Do you often feel tired, fatigued, or sleepy during the daytime? 1  Has anyone observed you stop breathing during your sleep? 0  Do you have, or are you being treated for high blood pressure? 1  BMI more than 35 kg/m2? 0  Age over 60 years old? 1  Neck circumference greater than 40 cm/16 inches? 1  Gender: 1

## 2015-07-14 NOTE — Progress Notes (Signed)
Abnormal BMET faxed to Dr.Wrenn 

## 2015-07-15 HISTORY — PX: ROTATOR CUFF REPAIR: SHX139

## 2015-07-19 NOTE — H&P (Signed)
Active Problems Problems  1. Benign prostatic hyperplasia with urinary obstruction (N40.1,N13.8) 2. Incomplete bladder emptying (R33.9) 3. Nocturia (R35.1) 4. Spermatocele (N43.40) 5. Weak urinary stream (R39.12)  History of Present Illness Matthew Cain returns today for voiding studies to complete his evaluation for a slow stream and elevated PVR.   Past Medical History Problems  1. History of Anxiety (F41.9) 2. History of cardiac disorder (Z86.79) 3. History of congestive heart failure (Z86.79) 4. History of depression (Z86.59) 5. History of diabetes mellitus (Z86.39) 6. History of diabetic neuropathy (Z86.39) 7. History of esophageal reflux (Z87.19) 8. History of hyperlipidemia (Z86.39) 9. History of hypertension (Z86.79) 10. History of myocardial infarction (I25.2) 11. History of stroke (Z61.09)  Surgical History Problems  1. History of Cath Stent Placement 2. History of Surgery Of Male Genitalia Vasectomy 3. History of Surgery Tunica Vaginalis Excision Of Hydrocele Right  Current Meds 1. Acebutolol HCl - 200 MG Oral Capsule;  Therapy: (Recorded:15Jun2016) to Recorded 2. AmLODIPine Besylate 10 MG Oral Tablet;  Therapy: (Recorded:15Jun2016) to Recorded 3. Aspirin 81 MG TABS;  Therapy: (Recorded:15Jun2016) to Recorded 4. Atorvastatin Calcium 40 MG Oral Tablet;  Therapy: (Recorded:15Jun2016) to Recorded 5. BuSpar TABS;  Therapy: (Recorded:15Jun2016) to Recorded 6. Cetirizine HCl - 10 MG Oral Tablet Chewable;  Therapy: (Recorded:15Jun2016) to Recorded 7. Citalopram Hydrobromide 20 MG Oral Tablet;  Therapy: (Recorded:15Jun2016) to Recorded 8. Fish Oil CAPS;  Therapy: (Recorded:15Jun2016) to Recorded 9. Furosemide 40 MG Oral Tablet;  Therapy: (Recorded:15Jun2016) to Recorded 10. GlipiZIDE 10 MG Oral Tablet;   Therapy: (Recorded:15Jun2016) to Recorded 11. Ibuprofen 800 MG Oral Tablet;   Therapy: (Recorded:15Jun2016) to Recorded 12. Iron TABS;   Therapy:  (Recorded:15Jun2016) to Recorded 13. Isosorbide Mononitrate 30 MG TB24;   Therapy: (Recorded:15Jun2016) to Recorded 14. Janumet TABS;   Therapy: (Recorded:15Jun2016) to Recorded 15. Lisinopril 10 MG Oral Tablet;   Therapy: (Recorded:15Jun2016) to Recorded 16. Pantoprazole Sodium 40 MG Oral Tablet Delayed Release;   Therapy: (Recorded:15Jun2016) to Recorded 17. Spironolactone 25 MG Oral Tablet;   Therapy: (Recorded:15Jun2016) to Recorded  Allergies No Known Allergies  1. No Known Allergies  Family History Problems  1. Family history of renal failure (Z84.1) : Uncle 2. No pertinent family history : Mother  Social History Problems  1. Alcohol use (Z78.9)   1 glass every 2 or 3 weeks 2. Death in the family, father   age 51 (Aneurysm) 3. Death in the family, mother   age 52 (Heart Attack) 4. Divorced 5. Former smoker [phone])   quit in 1996 6. No caffeine use 7. Number of children   1 Son & 1 Daughter 8. Occupation   Disabled  Vitals Vital Signs [Data Includes: Last 1 Day]  Recorded: 21Jun2016 11:46AM  Blood Pressure: 196 / 107 Temperature: 98.2 F Heart Rate: 67  Results/Data Urine [Data Includes: Last 1 Day]   21Jun2016  COLOR YELLOW   APPEARANCE CLEAR   SPECIFIC GRAVITY <1.005   pH 5.0   GLUCOSE 500 mg/dL  BILIRUBIN NEG   KETONE NEG mg/dL  BLOOD NEG   PROTEIN NEG mg/dL  UROBILINOGEN 0.2 mg/dL  NITRITE NEG   LEUKOCYTE ESTERASE NEG    Flow Rate: Voided 164 ml. A mean flow rate of 69ml/s and reduced but sustained flow curve  . He had an artifact spike to 14.    Procedure  Procedure: Cystoscopy   Indication: Lower Urinary Tract Symptoms.  Informed Consent: Risks, benefits, and potential adverse events were discussed and informed consent was obtained from the patient.  Prep: The  patient was prepped with betadine.  Antibiotic prophylaxis: Ciprofloxacin.  Procedure Note:  Urethral meatus:. No abnormalities.  Anterior urethra: No abnormalities.   Prostatic urethra: No abnormalities . Estimated length was 2 cm. There was visual obstruction of the prostatic urethra. With a tight bladder neck but no significant lateral lobe enlargement.  Bladder: Visulization was clear. The ureteral orifices were in the normal anatomic position bilaterally and had clear efflux of urine. A systematic survey of the bladder demonstrated no bladder tumors or stones. The mucosa was smooth without abnormalities. Examination of the bladder demonstrated mild trabeculation. The patient tolerated the procedure well.  Complications: None.    Assessment Assessed  1. Benign prostatic hyperplasia with urinary obstruction (N40.1,N13.8) 2. Incomplete bladder emptying (R33.9) 3. Weak urinary stream (R39.12) 4. Spermatocele (N43.40)  He has a tight bladder neck with a small prostate and a reduced stream with elevated PVR.  He may have some component of a hypotonic bladder.  He has a left spermatocele.   Plan Benign prostatic hyperplasia with urinary obstruction  1. Follow-up Schedule Surgery Office  Follow-up  Status: Complete  Done: 21Jun2016 Health Maintenance  2. UA With REFLEX; [Do Not Release]; Status:Resulted - Requires Verification;   Done:  21Jun2016 11:54AM  He needs a spermatocelectomy and I think he will be best served by a TUIP.  I have reviewed the risks of bleeding,infection, testicular atrophy, chronic scrotal pain, incontinence, strictures, ejaculatory and erectile dysfunction, thrombotic events and anesthetic complications.   I will have him cleared by cardiology.   Discussion/Summary CC: Kizzie Furnish PA and [facility].   Signatures Electronically signed by : Bjorn Pippin, M.D.; May 04 2015 12:24PM EST

## 2015-07-20 ENCOUNTER — Ambulatory Visit (HOSPITAL_COMMUNITY): Payer: Medicaid Other | Admitting: Certified Registered Nurse Anesthetist

## 2015-07-20 ENCOUNTER — Encounter (HOSPITAL_COMMUNITY): Payer: Self-pay | Admitting: *Deleted

## 2015-07-20 ENCOUNTER — Encounter (HOSPITAL_COMMUNITY)
Admission: RE | Disposition: A | Discharge status: Home / Self Care | Payer: Self-pay | Source: Ambulatory Visit | Attending: Urology

## 2015-07-20 ENCOUNTER — Observation Stay (HOSPITAL_COMMUNITY)
Admission: RE | Admit: 2015-07-20 | Discharge: 2015-07-21 | Disposition: A | Discharge status: Home / Self Care | Payer: Medicaid Other | Source: Ambulatory Visit | Attending: Urology | Admitting: Urology

## 2015-07-20 DIAGNOSIS — F419 Anxiety disorder, unspecified: Secondary | ICD-10-CM | POA: Insufficient documentation

## 2015-07-20 DIAGNOSIS — N401 Enlarged prostate with lower urinary tract symptoms: Secondary | ICD-10-CM | POA: Insufficient documentation

## 2015-07-20 DIAGNOSIS — R351 Nocturia: Secondary | ICD-10-CM | POA: Insufficient documentation

## 2015-07-20 DIAGNOSIS — Z8673 Personal history of transient ischemic attack (TIA), and cerebral infarction without residual deficits: Secondary | ICD-10-CM | POA: Insufficient documentation

## 2015-07-20 DIAGNOSIS — I129 Hypertensive chronic kidney disease with stage 1 through stage 4 chronic kidney disease, or unspecified chronic kidney disease: Secondary | ICD-10-CM | POA: Insufficient documentation

## 2015-07-20 DIAGNOSIS — I712 Thoracic aortic aneurysm, without rupture: Secondary | ICD-10-CM | POA: Insufficient documentation

## 2015-07-20 DIAGNOSIS — E1140 Type 2 diabetes mellitus with diabetic neuropathy, unspecified: Secondary | ICD-10-CM | POA: Diagnosis not present

## 2015-07-20 DIAGNOSIS — Z89022 Acquired absence of left finger(s): Secondary | ICD-10-CM | POA: Diagnosis not present

## 2015-07-20 DIAGNOSIS — Z791 Long term (current) use of non-steroidal anti-inflammatories (NSAID): Secondary | ICD-10-CM | POA: Diagnosis not present

## 2015-07-20 DIAGNOSIS — N189 Chronic kidney disease, unspecified: Secondary | ICD-10-CM | POA: Diagnosis not present

## 2015-07-20 DIAGNOSIS — E785 Hyperlipidemia, unspecified: Secondary | ICD-10-CM | POA: Diagnosis not present

## 2015-07-20 DIAGNOSIS — N138 Other obstructive and reflux uropathy: Secondary | ICD-10-CM | POA: Diagnosis not present

## 2015-07-20 DIAGNOSIS — Z79899 Other long term (current) drug therapy: Secondary | ICD-10-CM | POA: Insufficient documentation

## 2015-07-20 DIAGNOSIS — F329 Major depressive disorder, single episode, unspecified: Secondary | ICD-10-CM | POA: Diagnosis not present

## 2015-07-20 DIAGNOSIS — I252 Old myocardial infarction: Secondary | ICD-10-CM | POA: Diagnosis not present

## 2015-07-20 DIAGNOSIS — I5032 Chronic diastolic (congestive) heart failure: Secondary | ICD-10-CM | POA: Insufficient documentation

## 2015-07-20 DIAGNOSIS — N433 Hydrocele, unspecified: Secondary | ICD-10-CM | POA: Diagnosis not present

## 2015-07-20 DIAGNOSIS — Z87891 Personal history of nicotine dependence: Secondary | ICD-10-CM | POA: Diagnosis not present

## 2015-07-20 DIAGNOSIS — R3912 Poor urinary stream: Secondary | ICD-10-CM | POA: Insufficient documentation

## 2015-07-20 DIAGNOSIS — Z7982 Long term (current) use of aspirin: Secondary | ICD-10-CM | POA: Diagnosis not present

## 2015-07-20 DIAGNOSIS — K219 Gastro-esophageal reflux disease without esophagitis: Secondary | ICD-10-CM | POA: Insufficient documentation

## 2015-07-20 DIAGNOSIS — R3914 Feeling of incomplete bladder emptying: Secondary | ICD-10-CM | POA: Diagnosis not present

## 2015-07-20 HISTORY — PX: SPERMATOCELECTOMY: SHX2420

## 2015-07-20 HISTORY — PX: TRANSURETHRAL INCISION OF PROSTATE: SHX2573

## 2015-07-20 LAB — GLUCOSE, CAPILLARY
Glucose-Capillary: 177 mg/dL — ABNORMAL HIGH (ref 65–99)
Glucose-Capillary: 155 mg/dL — ABNORMAL HIGH (ref 65–99)
Glucose-Capillary: 188 mg/dL — ABNORMAL HIGH (ref 65–99)
Glucose-Capillary: 173 mg/dL — ABNORMAL HIGH (ref 65–99)
Glucose-Capillary: 270 mg/dL — ABNORMAL HIGH (ref 65–99)
Glucose-Capillary: 186 mg/dL — ABNORMAL HIGH (ref 65–99)

## 2015-07-20 SURGERY — EXCISION, SPERMATOCELE
Anesthesia: General | Site: Prostate

## 2015-07-20 MED ORDER — LORATADINE 10 MG PO TABS
10.0000 mg | ORAL_TABLET | Freq: Every day | ORAL | Status: DC
  Administered 2015-07-21: 10 mg via ORAL
  Filled 2015-07-20: qty 1

## 2015-07-20 MED ORDER — SPIRONOLACTONE 25 MG PO TABS
25.0000 mg | ORAL_TABLET | Freq: Every day | ORAL | Status: DC
  Administered 2015-07-20 – 2015-07-21 (×2): 25 mg via ORAL
  Filled 2015-07-20 (×2): qty 1

## 2015-07-20 MED ORDER — FENTANYL CITRATE (PF) 100 MCG/2ML IJ SOLN
INTRAMUSCULAR | Status: AC
  Filled 2015-07-20: qty 4

## 2015-07-20 MED ORDER — PROPOFOL 10 MG/ML IV BOLUS
INTRAVENOUS | Status: DC | PRN
  Administered 2015-07-20: 200 mg via INTRAVENOUS

## 2015-07-20 MED ORDER — SODIUM CHLORIDE 0.45 % IV SOLN
INTRAVENOUS | Status: DC
  Administered 2015-07-20: 10:00:00 via INTRAVENOUS

## 2015-07-20 MED ORDER — EPHEDRINE SULFATE 50 MG/ML IJ SOLN
INTRAMUSCULAR | Status: DC | PRN
  Administered 2015-07-20: 5 mg via INTRAVENOUS
  Administered 2015-07-20 (×4): 10 mg via INTRAVENOUS
  Administered 2015-07-20: 5 mg via INTRAVENOUS
  Administered 2015-07-20 (×3): 10 mg via INTRAVENOUS

## 2015-07-20 MED ORDER — FENTANYL CITRATE (PF) 100 MCG/2ML IJ SOLN
INTRAMUSCULAR | Status: DC | PRN
  Administered 2015-07-20 (×2): 25 ug via INTRAVENOUS
  Administered 2015-07-20: 100 ug via INTRAVENOUS
  Administered 2015-07-20: 50 ug via INTRAVENOUS

## 2015-07-20 MED ORDER — LIDOCAINE HCL (CARDIAC) 20 MG/ML IV SOLN
INTRAVENOUS | Status: DC | PRN
  Administered 2015-07-20: 50 mg via INTRAVENOUS

## 2015-07-20 MED ORDER — PROPOFOL 10 MG/ML IV BOLUS
INTRAVENOUS | Status: AC
  Filled 2015-07-20: qty 20

## 2015-07-20 MED ORDER — SODIUM CHLORIDE 0.9 % IJ SOLN
INTRAMUSCULAR | Status: AC
  Filled 2015-07-20: qty 10

## 2015-07-20 MED ORDER — GLIPIZIDE 10 MG PO TABS
10.0000 mg | ORAL_TABLET | Freq: Two times a day (BID) | ORAL | Status: DC
  Administered 2015-07-20 – 2015-07-21 (×2): 10 mg via ORAL
  Filled 2015-07-20 (×2): qty 1

## 2015-07-20 MED ORDER — DOCUSATE SODIUM 100 MG PO CAPS
100.0000 mg | ORAL_CAPSULE | Freq: Two times a day (BID) | ORAL | Status: DC
Start: 2015-07-20 — End: 2015-07-21
  Administered 2015-07-20 – 2015-07-21 (×3): 100 mg via ORAL
  Filled 2015-07-20 (×3): qty 1

## 2015-07-20 MED ORDER — SODIUM CHLORIDE 0.45 % IV SOLN: INTRAVENOUS | Status: DC

## 2015-07-20 MED ORDER — CIPROFLOXACIN IN D5W 400 MG/200ML IV SOLN
INTRAVENOUS | Status: AC
  Filled 2015-07-20: qty 200

## 2015-07-20 MED ORDER — BUPIVACAINE HCL 0.25 % IJ SOLN
INTRAMUSCULAR | Status: DC | PRN
  Administered 2015-07-20: 10 mL

## 2015-07-20 MED ORDER — BUPIVACAINE HCL (PF) 0.25 % IJ SOLN
INTRAMUSCULAR | Status: AC
  Filled 2015-07-20: qty 30

## 2015-07-20 MED ORDER — MIDAZOLAM HCL 5 MG/5ML IJ SOLN
INTRAMUSCULAR | Status: DC | PRN
  Administered 2015-07-20: 2 mg via INTRAVENOUS

## 2015-07-20 MED ORDER — ATORVASTATIN CALCIUM 40 MG PO TABS
40.0000 mg | ORAL_TABLET | Freq: Every evening | ORAL | Status: DC
  Administered 2015-07-20: 40 mg via ORAL
  Filled 2015-07-20: qty 1

## 2015-07-20 MED ORDER — LISINOPRIL 20 MG PO TABS
20.0000 mg | ORAL_TABLET | Freq: Every day | ORAL | Status: DC
  Administered 2015-07-20 – 2015-07-21 (×2): 20 mg via ORAL
  Filled 2015-07-20 (×2): qty 1

## 2015-07-20 MED ORDER — NITROGLYCERIN 0.4 MG SL SUBL: 0.4000 mg | SUBLINGUAL_TABLET | SUBLINGUAL | Status: DC | PRN

## 2015-07-20 MED ORDER — STERILE WATER FOR IRRIGATION IR SOLN
Status: DC | PRN
  Administered 2015-07-20: 3000 mL

## 2015-07-20 MED ORDER — HYDROCODONE-ACETAMINOPHEN 5-325 MG PO TABS
1.0000 | ORAL_TABLET | ORAL | Status: DC | PRN
  Administered 2015-07-20 (×3): 1 via ORAL
  Administered 2015-07-21: 2 via ORAL
  Filled 2015-07-20: qty 1
  Filled 2015-07-20: qty 2
  Filled 2015-07-20 (×2): qty 1

## 2015-07-20 MED ORDER — PANTOPRAZOLE SODIUM 40 MG PO TBEC
40.0000 mg | DELAYED_RELEASE_TABLET | Freq: Every day | ORAL | Status: DC
  Administered 2015-07-21: 40 mg via ORAL
  Filled 2015-07-20: qty 1

## 2015-07-20 MED ORDER — SITAGLIPTIN PHOS-METFORMIN HCL 50-1000 MG PO TABS: 1.0000 | ORAL_TABLET | Freq: Two times a day (BID) | ORAL | Status: DC

## 2015-07-20 MED ORDER — ONDANSETRON HCL 4 MG/2ML IJ SOLN
INTRAMUSCULAR | Status: AC
  Filled 2015-07-20: qty 2

## 2015-07-20 MED ORDER — EPHEDRINE SULFATE 50 MG/ML IJ SOLN
INTRAMUSCULAR | Status: AC
  Filled 2015-07-20: qty 1

## 2015-07-20 MED ORDER — ZOLPIDEM TARTRATE 5 MG PO TABS: 5.0000 mg | ORAL_TABLET | Freq: Every evening | ORAL | Status: DC | PRN

## 2015-07-20 MED ORDER — SENNOSIDES-DOCUSATE SODIUM 8.6-50 MG PO TABS
1.0000 | ORAL_TABLET | Freq: Every evening | ORAL | Status: DC | PRN
Start: 2015-07-20 — End: 2015-07-21

## 2015-07-20 MED ORDER — LINAGLIPTIN 5 MG PO TABS
5.0000 mg | ORAL_TABLET | Freq: Every day | ORAL | Status: DC
  Administered 2015-07-20 – 2015-07-21 (×2): 5 mg via ORAL
  Filled 2015-07-20 (×3): qty 1

## 2015-07-20 MED ORDER — ISOSORBIDE MONONITRATE ER 30 MG PO TB24
30.0000 mg | ORAL_TABLET | Freq: Every day | ORAL | Status: DC
  Administered 2015-07-21: 30 mg via ORAL
  Filled 2015-07-20: qty 1

## 2015-07-20 MED ORDER — ACEBUTOLOL HCL 200 MG PO CAPS
200.0000 mg | ORAL_CAPSULE | Freq: Two times a day (BID) | ORAL | Status: DC
  Administered 2015-07-21 (×2): 200 mg via ORAL
  Filled 2015-07-20 (×4): qty 1

## 2015-07-20 MED ORDER — HYDROMORPHONE HCL 1 MG/ML IJ SOLN
INTRAMUSCULAR | Status: AC
  Filled 2015-07-20: qty 1

## 2015-07-20 MED ORDER — ONDANSETRON HCL 4 MG/2ML IJ SOLN: 4.0000 mg | Freq: Once | INTRAMUSCULAR | Status: DC | PRN

## 2015-07-20 MED ORDER — HYDROMORPHONE HCL 1 MG/ML IJ SOLN
0.5000 mg | INTRAMUSCULAR | Status: DC | PRN
  Administered 2015-07-20: 0.5 mg via INTRAVENOUS
  Filled 2015-07-20: qty 1

## 2015-07-20 MED ORDER — HYDROMORPHONE HCL 1 MG/ML IJ SOLN
0.2500 mg | INTRAMUSCULAR | Status: DC | PRN
  Administered 2015-07-20: 0.5 mg via INTRAVENOUS

## 2015-07-20 MED ORDER — HYOSCYAMINE SULFATE 0.125 MG SL SUBL
0.1250 mg | SUBLINGUAL_TABLET | SUBLINGUAL | Status: DC | PRN
  Administered 2015-07-20: 0.125 mg via SUBLINGUAL
  Filled 2015-07-20 (×2): qty 1

## 2015-07-20 MED ORDER — ONDANSETRON HCL 4 MG/2ML IJ SOLN: 4.0000 mg | INTRAMUSCULAR | Status: DC | PRN

## 2015-07-20 MED ORDER — INSULIN ASPART 100 UNIT/ML ~~LOC~~ SOLN
0.0000 [IU] | Freq: Three times a day (TID) | SUBCUTANEOUS | Status: DC
  Administered 2015-07-20: 3 [IU] via SUBCUTANEOUS
  Administered 2015-07-20: 8 [IU] via SUBCUTANEOUS
  Administered 2015-07-21: 3 [IU] via SUBCUTANEOUS

## 2015-07-20 MED ORDER — ACETAMINOPHEN 325 MG PO TABS
650.0000 mg | ORAL_TABLET | ORAL | Status: DC | PRN
  Administered 2015-07-21: 650 mg via ORAL
  Filled 2015-07-20: qty 2

## 2015-07-20 MED ORDER — CITALOPRAM HYDROBROMIDE 20 MG PO TABS
20.0000 mg | ORAL_TABLET | Freq: Every day | ORAL | Status: DC
  Administered 2015-07-20 – 2015-07-21 (×2): 20 mg via ORAL
  Filled 2015-07-20 (×2): qty 1

## 2015-07-20 MED ORDER — POTASSIUM CHLORIDE IN NACL 20-0.45 MEQ/L-% IV SOLN
INTRAVENOUS | Status: DC
  Administered 2015-07-20 – 2015-07-21 (×3): via INTRAVENOUS
  Filled 2015-07-20 (×8): qty 1000

## 2015-07-20 MED ORDER — BUSPIRONE HCL 5 MG PO TABS
5.0000 mg | ORAL_TABLET | Freq: Three times a day (TID) | ORAL | Status: DC
  Administered 2015-07-20 – 2015-07-21 (×4): 5 mg via ORAL
  Filled 2015-07-20 (×4): qty 1

## 2015-07-20 MED ORDER — AMLODIPINE BESYLATE 10 MG PO TABS
10.0000 mg | ORAL_TABLET | Freq: Every day | ORAL | Status: DC
  Administered 2015-07-21: 10 mg via ORAL
  Filled 2015-07-20: qty 1

## 2015-07-20 MED ORDER — HYDROCODONE-ACETAMINOPHEN 5-325 MG PO TABS: 1.0000 | ORAL_TABLET | Freq: Four times a day (QID) | ORAL | Status: DC | PRN

## 2015-07-20 MED ORDER — KCL IN DEXTROSE-NACL 20-5-0.45 MEQ/L-%-% IV SOLN: INTRAVENOUS | Status: DC

## 2015-07-20 MED ORDER — FERROUS SULFATE 325 (65 FE) MG PO TABS
325.0000 mg | ORAL_TABLET | Freq: Every day | ORAL | Status: DC
  Administered 2015-07-20: 325 mg via ORAL
  Filled 2015-07-20: qty 1

## 2015-07-20 MED ORDER — METFORMIN HCL 500 MG PO TABS
1000.0000 mg | ORAL_TABLET | Freq: Two times a day (BID) | ORAL | Status: DC
  Administered 2015-07-20 – 2015-07-21 (×2): 1000 mg via ORAL
  Filled 2015-07-20 (×2): qty 2

## 2015-07-20 MED ORDER — FLEET ENEMA 7-19 GM/118ML RE ENEM: 1.0000 | ENEMA | Freq: Once | RECTAL | Status: DC | PRN

## 2015-07-20 MED ORDER — LIDOCAINE HCL (CARDIAC) 20 MG/ML IV SOLN
INTRAVENOUS | Status: AC
  Filled 2015-07-20: qty 5

## 2015-07-20 MED ORDER — DOCUSATE SODIUM 100 MG PO CAPS: 100.0000 mg | ORAL_CAPSULE | Freq: Two times a day (BID) | ORAL | Status: DC

## 2015-07-20 MED ORDER — CIPROFLOXACIN HCL 500 MG PO TABS
500.0000 mg | ORAL_TABLET | Freq: Two times a day (BID) | ORAL | Status: DC
  Administered 2015-07-20 – 2015-07-21 (×2): 500 mg via ORAL
  Filled 2015-07-20 (×2): qty 1

## 2015-07-20 MED ORDER — MEPERIDINE HCL 50 MG/ML IJ SOLN: 6.2500 mg | INTRAMUSCULAR | Status: DC | PRN

## 2015-07-20 MED ORDER — ONDANSETRON HCL 4 MG/2ML IJ SOLN
INTRAMUSCULAR | Status: DC | PRN
  Administered 2015-07-20: 4 mg via INTRAVENOUS

## 2015-07-20 MED ORDER — VITAMIN D3 25 MCG (1000 UNIT) PO TABS
1000.0000 [IU] | ORAL_TABLET | Freq: Every day | ORAL | Status: DC
  Administered 2015-07-20 – 2015-07-21 (×2): 1000 [IU] via ORAL
  Filled 2015-07-20 (×4): qty 1

## 2015-07-20 MED ORDER — MIDAZOLAM HCL 2 MG/2ML IJ SOLN
INTRAMUSCULAR | Status: AC
  Filled 2015-07-20: qty 4

## 2015-07-20 MED ORDER — FUROSEMIDE 40 MG PO TABS
40.0000 mg | ORAL_TABLET | Freq: Every day | ORAL | Status: DC
  Administered 2015-07-20 – 2015-07-21 (×2): 40 mg via ORAL
  Filled 2015-07-20 (×2): qty 1

## 2015-07-20 MED ORDER — LACTATED RINGERS IV SOLN
INTRAVENOUS | Status: DC | PRN
  Administered 2015-07-20: 07:00:00 via INTRAVENOUS

## 2015-07-20 MED ORDER — BISACODYL 10 MG RE SUPP: 10.0000 mg | Freq: Every day | RECTAL | Status: DC | PRN

## 2015-07-20 MED ORDER — CIPROFLOXACIN IN D5W 400 MG/200ML IV SOLN
400.0000 mg | INTRAVENOUS | Status: AC
  Administered 2015-07-20: 400 mg via INTRAVENOUS

## 2015-07-20 SURGICAL SUPPLY — 31 items
BAG URINE DRAINAGE (UROLOGICAL SUPPLIES) ×4 IMPLANT
BAG URO CATCHER STRL LF (DRAPE) ×4 IMPLANT
BNDG GAUZE ELAST 4 BULKY (GAUZE/BANDAGES/DRESSINGS) ×4 IMPLANT
CATH FOLEY 2WAY SLVR 30CC 22FR (CATHETERS) ×4 IMPLANT
CATH FOLEY 3WAY 30CC 22FR (CATHETERS) IMPLANT
CATH URET 5FR 28IN OPEN ENDED (CATHETERS) IMPLANT
COUNTER NEEDLE 20 DBL MAG RED (NEEDLE) ×4 IMPLANT
DRAPE PED LAPAROTOMY (DRAPES) ×4 IMPLANT
ELECT KNIFE MONO 22-24F 12/30D (ELECTROSURGICAL) ×4
ELECT REM PT RETURN 9FT ADLT (ELECTROSURGICAL) ×4
ELECTRODE KNF MON 22-24F 12/30 (ELECTROSURGICAL) ×2 IMPLANT
ELECTRODE REM PT RTRN 9FT ADLT (ELECTROSURGICAL) ×2 IMPLANT
EVACUATOR MICROVAS BLADDER (UROLOGICAL SUPPLIES) ×4 IMPLANT
GAUZE SPONGE 4X4 16PLY XRAY LF (GAUZE/BANDAGES/DRESSINGS) ×4 IMPLANT
GLOVE SURG SS PI 8.0 STRL IVOR (GLOVE) IMPLANT
GOWN STRL REUS W/TWL XL LVL3 (GOWN DISPOSABLE) ×4 IMPLANT
HOLDER FOLEY CATH W/STRAP (MISCELLANEOUS) ×4 IMPLANT
KIT ASPIRATION TUBING (SET/KITS/TRAYS/PACK) ×4 IMPLANT
KIT BASIN OR (CUSTOM PROCEDURE TRAY) ×4 IMPLANT
LIQUID BAND (GAUZE/BANDAGES/DRESSINGS) ×4 IMPLANT
MANIFOLD NEPTUNE II (INSTRUMENTS) ×4 IMPLANT
NEEDLE HYPO 22GX1.5 SAFETY (NEEDLE) ×4 IMPLANT
PACK CYSTO (CUSTOM PROCEDURE TRAY) ×4 IMPLANT
SUPPORT SCROTAL LG STRP (MISCELLANEOUS) ×3 IMPLANT
SUPPORTER ATHLETIC LG (MISCELLANEOUS) ×1
SUT CHROMIC 3 0 SH 27 (SUTURE) ×8 IMPLANT
SYR 30ML LL (SYRINGE) IMPLANT
SYRINGE IRR TOOMEY STRL 70CC (SYRINGE) IMPLANT
TOWEL OR 17X26 10 PK STRL BLUE (TOWEL DISPOSABLE) ×8 IMPLANT
TUBING CONNECTING 10 (TUBING) ×3 IMPLANT
TUBING CONNECTING 10' (TUBING) ×1

## 2015-07-20 NOTE — Discharge Instructions (Signed)
Hydrocele, Adult °Fluid can collect around the testicles. This fluid forms in a sac. This condition is called a hydrocele. The collected fluid causes swelling of the scrotum. Usually, it affects just one testicle. Most of the time, the condition does not cause pain. Sometimes, the hydrocele goes away on its own. Other times, surgery is needed to get rid of the fluid. °CAUSES °A hydrocele does not develop often. Different things can cause a hydrocele in a man, including: °· Injury to the scrotum. °· Infection. °· X-ray of the area around the scrotum. °· A tumor or cancer of the testicle. °· Twisting of a testicle. °· Decreased blood flow to the scrotum. °SYMPTOMS  °· Swelling without pain. The hydrocele feels like a water-filled balloon. °· Swelling with pain. This can occur if the hydrocele was caused by infection or twisting. °· Mild discomfort in the scrotum. °· The hydrocele may feel heavy. °· Swelling that gets smaller when you lie down. °DIAGNOSIS  °Your caregiver will do a physical exam to decide if you have a hydrocele. This may include: °· Asking questions about your overall health, today and in the past. Your caregiver may ask about any injuries, X-rays, or infections. °· Pushing on your abdomen or asking you to change positions to see if the size of the hydrocele changes. °· Shining a light through the scrotum (transillumination) to see if the fluid inside the scrotum is clear. °· Blood tests and urine tests to check for infection. °· Imaging studies that take pictures of the scrotum and testicles. °TREATMENT  °Treatment depends in part on what caused the condition. Options include: °· Watchful waiting. Your caregiver checks the hydrocele every so often. °· Different surgeries to drain the fluid. °¨ A needle may be put into the scrotum to drain fluid (needle aspiration). Fluid often returns after this type of treatment. °¨ A cut (incision) may be made in the scrotum to remove the fluid sac  (hydrocelectomy). °¨ An incision may be made in the groin to repair a hydrocele that has contact with abdominal fluids (communicating hydrocele). °· Medicines to treat an infection (antibiotics). °HOME CARE INSTRUCTIONS  °What you need to do at home may depend on the cause of the hydrocele and type of treatment. In general: °· Take all medicine as directed by your caregiver. Follow the directions carefully. °· Ask your caregiver if there is anything you should not do while you recover (activities, lifting, work, sex). °· If you had surgery to repair a communicating hydrocele, recovery time may vary. Ask you caregiver about your recovery time. °· Avoid heavy lifting for 4 to 6 weeks. °· If you had an incision on the scrotum or groin, wash it for 2 to 3 days after surgery. Do this as long as the skin is closed and there are no gaps in the wound. Wash gently, and avoid rubbing the incision. °· Keep all follow-up appointments. °SEEK MEDICAL CARE IF:  °· Your scrotum seems to be getting larger. °· The area becomes more and more uncomfortable. °SEEK IMMEDIATE MEDICAL CARE IF:  °You have a fever. °Document Released: 04/19/2010 Document Revised: 08/20/2013 Document Reviewed: 04/19/2010 °ExitCare® Patient Information ©2015 [employer]. This information is not intended to replace advice given to you by your health care provider. Make sure you discuss any questions you have with your health care provider. °Transurethral Resection of the Prostate °Care After °Refer to this sheet in the next few weeks. These instructions provide you with information on caring for yourself   after your procedure. Your caregiver also may give you specific instructions. Your treatment has been planned according to current medical practices, but complications sometimes occur. Call your caregiver if you have any problems or questions after your procedure. °HOME CARE INSTRUCTIONS  °Recovery can take 4-6 weeks. Avoid alcohol, caffeinated drinks, and  spicy foods for 2 weeks after your procedure. Drink enough fluids to keep your urine clear or pale yellow. Urinate as soon as you feel the urge to do so. Do not try to hold your urine for long periods of time. °During recovery you may experience pain caused by bladder spasms, which result in a very intense urge to urinate. Take all medicines as directed by your caregiver, including medicines for pain. Try to limit the amount of pain medicines you take because it can cause constipation. If you do become constipated, do not strain to move your bowels. Straining can increase bleeding. Constipation can be minimized by increasing the amount fluids and fiber in your diet. Your caregiver also may prescribe a stool softener. °Do not lift heavy objects (more than 5 lb [2.25 kg]) or perform exercises that cause you to strain for at least 1 month after your procedure. When sitting, you may want to sit in a soft chair or use a cushion. For the first 10 days after your procedure, avoid the following activities: °· Running. °· Strenuous work. °· Long walks. °· Riding in a car for extended periods. °· Sex. °SEEK MEDICAL CARE IF: °· You have difficulty urinating. °· You have blood in your urine that does not go away after you rest or increase your fluid intake. °· You have swelling in your penis or scrotum. °SEEK IMMEDIATE MEDICAL CARE IF:  °· You are suddenly unable to urinate. °· You notice blood clots in your urine. °· You have chills. °· You have a fever. °· You have pain in your back or lower abdomen. °· You have pain or swelling in your legs. °MAKE SURE YOU:  °· Understand these instructions. °· Will watch your condition. °· Will get help right away if you are not doing well or get worse. °Document Released: 10/30/2005 Document Revised: 07/24/2012 Document Reviewed: 12/08/2011 °ExitCare® Patient Information ©2015 [employer]. This information is not intended to replace advice given to you by your health care provider. Make  sure you discuss any questions you have with your health care provider. ° °

## 2015-07-20 NOTE — Progress Notes (Signed)
Patient ID: Flonnie Overman., male   DOB: [birth year], 59 y.o.   MRN: [id number] Doing well post op.  No complaints of pain.   Urine clear.    BP 114/68 mmHg  Pulse 71  Temp(Src) 97.8 F (36.6 C) (Oral)  Resp 18  Ht  (1.803 m)  Wt 108.5 kg (239 lb 3.2 oz)  BMI 33.38 kg/m2  SpO2 99%   Foley out in am.   D/C when voiding.

## 2015-07-20 NOTE — Anesthesia Procedure Notes (Signed)
Procedure Name: LMA Insertion Date/Time: 07/20/2015 7:37 AM Performed by: Carolyne Fiscal, Rachael Ferrie F Pre-anesthesia Checklist: Patient identified, Emergency Drugs available, Suction available, Patient being monitored and Timeout performed Patient Re-evaluated:Patient Re-evaluated prior to inductionOxygen Delivery Method: Circle system utilized Preoxygenation: Pre-oxygenation with 100% oxygen Intubation Type: IV induction Ventilation: Mask ventilation without difficulty LMA: LMA inserted LMA Size: 5.0 Number of attempts: 1 Placement Confirmation: positive ETCO2 and breath sounds checked- equal and bilateral Tube secured with: Tape Dental Injury: Teeth and Oropharynx as per pre-operative assessment

## 2015-07-20 NOTE — Transfer of Care (Signed)
Immediate Anesthesia Transfer of Care Note  Patient: Matthew Cain.  Procedure(s) Performed: Procedure(s): LEFT HYDROCELECTOMY (Left) TRANSURETHRAL INCISION OF THE PROSTATE (TUIP) (N/A)  Patient Location: PACU  Anesthesia Type:General  Level of Consciousness: awake, alert  and oriented  Airway & Oxygen Therapy: Patient Spontanous Breathing and Patient connected to face mask oxygen  Post-op Assessment: Report given to RN and Post -op Vital signs reviewed and stable  Post vital signs: Reviewed and stable  Last Vitals:  Filed Vitals:   07/20/15 0507  BP: 163/94  Pulse: 66  Temp: 36.9 C  Resp: 18    Complications: No apparent anesthesia complications

## 2015-07-20 NOTE — Anesthesia Postprocedure Evaluation (Signed)
Anesthesia Post Note  Patient: Matthew Cain.  Procedure(s) Performed: Procedure(s) (LRB): LEFT HYDROCELECTOMY (Left) TRANSURETHRAL INCISION OF THE PROSTATE (TUIP) (N/A)  Anesthesia type: general  Patient location: PACU  Post pain: Pain level controlled  Post assessment: Patient's Cardiovascular Status Stable  Last Vitals:  Filed Vitals:   07/20/15 1311  BP: 114/68  Pulse: 71  Temp: 36.6 C  Resp: 18    Post vital signs: Reviewed and stable  Level of consciousness: sedated  Complications: No apparent anesthesia complications

## 2015-07-20 NOTE — Interval H&P Note (Signed)
History and Physical Interval Note:  07/20/2015 7:26 AM  Matthew Cain.  has presented today for surgery, with the diagnosis of LEFT SPERMATOCELE, BPH WITH BLADDER OUTLET OBSTRUCTION  The various methods of treatment have been discussed with the patient and family. After consideration of risks, benefits and other options for treatment, the patient has consented to  Procedure(s): LEFT SPERMATOCELECTOMY (Left) TRANSURETHRAL INCISION OF THE PROSTATE (TUIP) (N/A) as a surgical intervention .  The patient's history has been reviewed, patient examined, no change in status, stable for surgery.  I have reviewed the patient's chart and labs.  Questions were answered to the patient's satisfaction.     [name]

## 2015-07-20 NOTE — Brief Op Note (Signed)
07/20/2015  8:37 AM  PATIENT:  Matthew Cain.  59 y.o. male  PRE-OPERATIVE DIAGNOSIS:  LEFT SPERMATOCELE, BPH WITH BLADDER OUTLET OBSTRUCTION  POST-OPERATIVE DIAGNOSIS:  LEFT HYDROCELE BPH WITH BLADDER OUTLET   PROCEDURE:  Procedure(s): LEFT Hydrocelectomy (Left) TRANSURETHRAL INCISION OF THE PROSTATE (TUIP) (N/A)  SURGEON:  Surgeon(s) and Role:    * Bjorn Pippin, MD - Primary  PHYSICIAN ASSISTANT:   ASSISTANTS: none   ANESTHESIA:   general  EBL:  Total I/O In: 900 [I.V.:900] Out: -   BLOOD ADMINISTERED:none  DRAINS: Urinary Catheter (Foley)   LOCAL MEDICATIONS USED:  MARCAINE %0.25 plain  and Amount: 10 ml  SPECIMEN:  No Specimen  DISPOSITION OF SPECIMEN:  N/A  COUNTS:  YES  TOURNIQUET:  * No tourniquets in log *  DICTATION: .Other Dictation: Dictation Number [id number]  PLAN OF CARE: Admit for overnight observation  PATIENT DISPOSITION:  PACU - hemodynamically stable.   Delay start of Pharmacological VTE agent (>24hrs) due to surgical blood loss or risk of bleeding: yes

## 2015-07-20 NOTE — Anesthesia Preprocedure Evaluation (Signed)
Anesthesia Evaluation  Patient identified by MRN, date of birth, ID band Patient awake    Reviewed: Allergy & Precautions, NPO status , Patient's Chart, lab work & pertinent test results  Airway Mallampati: II  TM Distance: >3 FB Neck ROM: Full    Dental   Pulmonary former smoker,    Pulmonary exam normal       Cardiovascular hypertension, Pt. on medications + CAD and + Past MI Normal cardiovascular exam    Neuro/Psych    GI/Hepatic GERD-  Medicated and Controlled,  Endo/Other  diabetes, Type 2, Oral Hypoglycemic Agents  Renal/GU Renal InsufficiencyRenal disease     Musculoskeletal   Abdominal   Peds  Hematology   Anesthesia Other Findings   Reproductive/Obstetrics                             Anesthesia Physical Anesthesia Plan  ASA: II  Anesthesia Plan: General   Post-op Pain Management:    Induction: Intravenous  Airway Management Planned: LMA  Additional Equipment:   Intra-op Plan:   Post-operative Plan: Extubation in OR  Informed Consent: I have reviewed the patients History and Physical, chart, labs and discussed the procedure including the risks, benefits and alternatives for the proposed anesthesia with the patient or authorized representative who has indicated his/her understanding and acceptance.     Plan Discussed with: CRNA and Surgeon  Anesthesia Plan Comments:         Anesthesia Quick Evaluation

## 2015-07-21 DIAGNOSIS — N433 Hydrocele, unspecified: Secondary | ICD-10-CM | POA: Diagnosis not present

## 2015-07-21 LAB — GLUCOSE, CAPILLARY: Glucose-Capillary: 152 mg/dL — ABNORMAL HIGH (ref 65–99)

## 2015-07-21 NOTE — Discharge Summary (Signed)
Physician Discharge Summary  Patient ID: Matthew Cain. MRN: [id number] DOB/AGE: [birth year] 59 y.o.  Admit date: 07/20/2015 Discharge date: 07/21/2015  Admission Diagnoses:  BPH with obstruction/lower urinary tract symptoms  Discharge Diagnoses:  Principal Problem:   BPH with obstruction/lower urinary tract symptoms Active Problems:   Hydrocele, left   Past Medical History  Diagnosis Date  . Hypertension   . Chronic diastolic heart failure     EF 40-98%, grade 1 diastolic dysfunction; no WMAs, echo, 11/2011; s/p pulmo. edema c/b VDRF 11/12; Echo 09/08/11: severe LVH, no SAM, no LVOT gradient, EF 55%, mild BAE, mild reduced RVSF.  [name] CAD (coronary artery disease)     LHC 10/12:  mild plaque in the LAD but no obstructive CAD  . Diabetes mellitus     A1C 7.3 08/2011  . CKD (chronic kidney disease)   . HLD (hyperlipidemia)   . Thoracic aortic aneurysm     CT in 10/12: 4.4 cm ascending thoracic aortic aneurysm  . Myocardial infarction 07/2011  . History of right ACA stroke     10/12  --  MRI 09/13/11:  Acute right ACA infarct involving corpus callosum.  MRA 09/13/11: severe intracranial ASD, right ACA occluded, left PCA and right MCA with severe disease.  Dopplers were neg for significant ICA stenosis  . Shortness of breath dyspnea     with exertion  . Tongue tied     "at times I get tongue tied since the stroke"  . Amputation finger     left ring finger  . GERD (gastroesophageal reflux disease)   . Constipation     "due to medication"  . History of kidney stones   . History of transfusion   . Spermatocele   . Depression   . Anxiety     Surgeries: Procedure(s): LEFT HYDROCELECTOMY TRANSURETHRAL INCISION OF THE PROSTATE (TUIP) on 07/20/2015   Consultants (if any):    Discharged Condition: Improved  Hospital Course: Matthew Cain. is an 59 y.o. male who was admitted 07/20/2015 with a diagnosis of BPH with obstruction/lower urinary tract symptoms and went to the operating  room on 07/20/2015 and underwent the above named procedures.  He did well post op.   The foley was removed at 5am and he will be discharged when voiding.   His scrotal wound is intact without swelling.   He was given perioperative antibiotics:      Anti-infectives    Start     Dose/Rate Route Frequency Ordered Stop   07/20/15 2000  ciprofloxacin (CIPRO) tablet 500 mg     500 mg Oral 2 times daily 07/20/15 1026     07/20/15 0506  ciprofloxacin (CIPRO) IVPB 400 mg     400 mg 200 mL/hr over 60 Minutes Intravenous 60 min pre-op 07/20/15 0506 07/20/15 0739    .  He was given sequential compression devices for DVT prophylaxis.  He benefited maximally from the hospital stay and there were no complications.    Recent vital signs:  Filed Vitals:   07/21/15 0503  BP: 132/75  Pulse: 72  Temp: 98.1 F (36.7 C)  Resp: 16    Recent laboratory studies:  Lab Results  Component Value Date   HGB 13.6 07/14/2015   HGB 12.2* 09/14/2011   HGB 13.6 09/10/2011   Lab Results  Component Value Date   WBC 8.3 07/14/2015   PLT 408* 07/14/2015   No results found for: INR Lab Results  Component Value Date   NA  139 07/14/2015   K 4.2 07/14/2015   CL 102 07/14/2015   CO2 26 07/14/2015   BUN 22* 07/14/2015   CREATININE 1.46* 07/14/2015   GLUCOSE 145* 07/14/2015    Discharge Medications:     Medication List    TAKE these medications        acebutolol 200 MG capsule  Commonly known as:  SECTRAL  TAKE ONE CAPSULE BY MOUTH TWICE DAILY     amLODipine 10 MG tablet  Commonly known as:  NORVASC  Take 1 tablet (10 mg total) by mouth daily.     aspirin 81 MG EC tablet  Take 81 mg by mouth daily.     atorvastatin 40 MG tablet  Commonly known as:  LIPITOR  Take 1 tablet (40 mg total) by mouth every evening.     busPIRone 5 MG tablet  Commonly known as:  BUSPAR  Take 5 mg by mouth 3 (three) times daily.     cetirizine 10 MG tablet  Commonly known as:  ZYRTEC  Take 10 mg by mouth every  morning.     cholecalciferol 1000 UNITS tablet  Commonly known as:  VITAMIN D  Take 1,000 Units by mouth every morning.     citalopram 20 MG tablet  Commonly known as:  CELEXA  Take 20 mg by mouth every morning.     docusate sodium 100 MG capsule  Commonly known as:  COLACE  Take 1 capsule (100 mg total) by mouth 2 (two) times daily.     Fish Oil 1000 MG Caps  Take 2 capsules by mouth every morning.     furosemide 40 MG tablet  Commonly known as:  LASIX  TAKE ONE TABLET BY MOUTH TWICE DAILY     glipiZIDE 10 MG tablet  Commonly known as:  GLUCOTROL  Take 10 mg by mouth 2 (two) times daily before a meal.     HYDROcodone-acetaminophen 5-325 MG per tablet  Commonly known as:  NORCO  Take 1 tablet by mouth every 6 (six) hours as needed for moderate pain.     ibuprofen 800 MG tablet  Commonly known as:  ADVIL,MOTRIN  Take 800 mg by mouth every 8 (eight) hours as needed for pain.     IRON SUPPLEMENT 325 (65 FE) MG tablet  Generic drug:  ferrous sulfate  Take 325 mg by mouth at bedtime.     isosorbide mononitrate 30 MG 24 hr tablet  Commonly known as:  IMDUR  Take 1 tablet (30 mg total) by mouth daily.     lisinopril 20 MG tablet  Commonly known as:  PRINIVIL,ZESTRIL  Take 1 tablet (20 mg total) by mouth daily.     nitroGLYCERIN 0.4 MG SL tablet  Commonly known as:  NITROSTAT  Place 1 tablet (0.4 mg total) under the tongue every 5 (five) minutes as needed for chest pain.     pantoprazole 40 MG tablet  Commonly known as:  PROTONIX  Take 40 mg by mouth every morning.     sitaGLIPtin-metformin 50-1000 MG per tablet  Commonly known as:  JANUMET  Take 1 tablet by mouth 2 (two) times daily with a meal.     spironolactone 25 MG tablet  Commonly known as:  ALDACTONE  TAKE ONE TABLET BY MOUTH ONCE DAILY        Diagnostic Studies: No results found.  Disposition: 03-Skilled Nursing Facility  Discharge Instructions    Discontinue IV    Complete by:  As directed  Follow-up Information    Follow up with Hillery Aldo, NP On 08/03/2015.   Specialty:  Nurse Practitioner   Why:  [phone]   Contact information:   [address] [place] [address] [phone]        Signed: Anner Crete 07/21/2015, 6:48 AM

## 2015-07-21 NOTE — Progress Notes (Addendum)
Went over all discharge paperwork with patient.  All questions answered.  Prescriptions given.  Per MD patient is ok to drive self home at 16XW. (Last pain medication given at 2am last night).  Went over all HF information, packet given. Per MD, told patient to resume Aspirin in one week. Pt refused wheelchair, walked out by himself.

## 2015-07-21 NOTE — Op Note (Signed)
NAME:  Matthew Cain, Matthew Cain                   ACCOUNT NO.:  [id number]  MEDICAL RECORD NO.:  [id number]  LOCATION:  1428                         FACILITY:  [facility]  PHYSICIAN:  Excell Seltzer. Annabell Howells, M.D.    DATE OF BIRTH:  [birth year]  DATE OF PROCEDURE:  07/20/2015 DATE OF DISCHARGE:                              OPERATIVE REPORT   PROCEDURE: 1. Left hydrocelectomy. 2. Transurethral incision of the prostate.  PREOPERATIVE DIAGNOSIS:  Left spermatocele and benign prostatic hyperplasia and bladder outlet obstruction.  POSTOPERATIVE DIAGNOSIS:  Left hydrocele with benign prostatic hyperplasia and bladder outlet obstruction.  SURGEON:  Excell Seltzer. Annabell Howells, M.D.  ANESTHESIA:  General.  SPECIMEN:  None.  DRAINS:  A 22-French Foley catheter.  BLOOD LOSS:  Minimal.  COMPLICATIONS:  None.  INDICATIONS:  Matthew Cain is a 59 year old African-American male, who has BPH with a high bladder neck and outlet obstruction and a large left spermatocele.  It was felt that spermatocelectomy and transurethral incision of the prostate were indicated.  FINDINGS OF PROCEDURE:  He was taken to the operating room where he was given Cipro.  He was fitted with PAS hose in the supine position, a general anesthetic was induced.  His scrotum was clipped, and he was prepped with Betadine solution.  He was then draped in usual sterile fashion.  An oblique left anterior scrotal incision was made with a knife.  This was carried through the dartos with the Bovie.  I encountered the tunica vaginalis and it appeared that he actually had about a 5-cm hydrocele instead of spermatocele.  The testicle within the hydrocele sac was delivered from the incision.  The sac was opened and the fluid was drained.  Approximately, 70 mL of fluid was aspirated.  At this point, the sac was opened more completely and the redundant Caliendo was excised. The sac was then imbricated behind the testicle in a water bottle fashion using a running 3-0  chromic.  At this point, once hemostasis was achieved, a core block was performed with 0.25% Marcaine without epinephrine.  Approximately, 5 mL was used for the core block and the additional 5 mL for an incisional block. Once this was done, the testicle was delivered back into the left hemiscrotum, hemostasis was achieved.  The dartos was closed using a running 3-0 chromic suture.  The skin was closed with an interrupted vertical mattress 3-0 chromic and then reinforced with Dermabond once the wound had been cleansed.  At this point, the patient was undraped and repositioned in the lithotomy position.  He was then re-prepped and draped and cystoscopy was then performed using a 23-French scope and 30-degree lens.  Inspection revealed a normal urethra.  The external sphincter was intact.  The prostatic urethra was short, approximately 2 cm in length, with minimal lateral lobe enlargement with a high bladder neck.  It was actually difficult to negotiate over.  Once in the bladder, there was moderate trabeculation.  No tumors or stones were noted.  Ureteral orifices were in the normal anatomic position.  At this point, the urethra was calibrated to 32-French with Sissy Hoff sounds and a 28-French continuous flow resectoscope was placed with the  aid of a visual obturator.  It was very difficult to get over the bladder neck.  Once in the bladder, however, the scope was fitted with an [place] handle with a monopolar [employer].  Water was used as the irrigant.  An incision was initially made at 5 o'clock from bladder neck out to the verumontanum down to the capsular fibers where fat was exposed. Once the left-hand incision was made, created a symmetrical incision on the right side.  This widely opened the prostatic urethra.  At this point, hemostasis was achieved and the scope was removed.  Pressure on the bladder produced an adequate stream.  A 22-French Foley catheter was inserted  without difficulty.  The balloon was filled with 30 mL of sterile fluid.  The irrigant returned clear.  The catheter was placed to straight drainage.  The patient was taken down from lithotomy position. His anesthetic was reversed.  He was moved to the recovery room in stable condition.  He will be kept overnight for observation with a voiding trial in the morning.     Excell Seltzer. Annabell Howells, M.D.     JJW/MEDQ  D:  07/20/2015  T:  07/21/2015  Job:  [id number]

## 2015-07-22 ENCOUNTER — Encounter (HOSPITAL_COMMUNITY): Payer: Self-pay | Admitting: Urology

## 2015-07-28 ENCOUNTER — Emergency Department (HOSPITAL_COMMUNITY)
Admission: EM | Admit: 2015-07-28 | Discharge: 2015-07-28 | Disposition: A | Discharge status: Home / Self Care | Payer: Medicaid Other | Attending: Emergency Medicine | Admitting: Emergency Medicine

## 2015-07-28 ENCOUNTER — Encounter (HOSPITAL_COMMUNITY): Payer: Self-pay | Admitting: *Deleted

## 2015-07-28 DIAGNOSIS — I252 Old myocardial infarction: Secondary | ICD-10-CM | POA: Diagnosis not present

## 2015-07-28 DIAGNOSIS — I129 Hypertensive chronic kidney disease with stage 1 through stage 4 chronic kidney disease, or unspecified chronic kidney disease: Secondary | ICD-10-CM | POA: Insufficient documentation

## 2015-07-28 DIAGNOSIS — Z7982 Long term (current) use of aspirin: Secondary | ICD-10-CM | POA: Insufficient documentation

## 2015-07-28 DIAGNOSIS — F329 Major depressive disorder, single episode, unspecified: Secondary | ICD-10-CM | POA: Insufficient documentation

## 2015-07-28 DIAGNOSIS — Z8673 Personal history of transient ischemic attack (TIA), and cerebral infarction without residual deficits: Secondary | ICD-10-CM | POA: Diagnosis not present

## 2015-07-28 DIAGNOSIS — N189 Chronic kidney disease, unspecified: Secondary | ICD-10-CM | POA: Insufficient documentation

## 2015-07-28 DIAGNOSIS — Z89022 Acquired absence of left finger(s): Secondary | ICD-10-CM | POA: Insufficient documentation

## 2015-07-28 DIAGNOSIS — N9989 Other postprocedural complications and disorders of genitourinary system: Secondary | ICD-10-CM | POA: Insufficient documentation

## 2015-07-28 DIAGNOSIS — Y836 Removal of other organ (partial) (total) as the cause of abnormal reaction of the patient, or of later complication, without mention of misadventure at the time of the procedure: Secondary | ICD-10-CM | POA: Diagnosis not present

## 2015-07-28 DIAGNOSIS — Z87442 Personal history of urinary calculi: Secondary | ICD-10-CM | POA: Insufficient documentation

## 2015-07-28 DIAGNOSIS — E119 Type 2 diabetes mellitus without complications: Secondary | ICD-10-CM | POA: Insufficient documentation

## 2015-07-28 DIAGNOSIS — K5900 Constipation, unspecified: Secondary | ICD-10-CM | POA: Diagnosis not present

## 2015-07-28 DIAGNOSIS — Z9889 Other specified postprocedural states: Secondary | ICD-10-CM | POA: Diagnosis not present

## 2015-07-28 DIAGNOSIS — F419 Anxiety disorder, unspecified: Secondary | ICD-10-CM | POA: Insufficient documentation

## 2015-07-28 DIAGNOSIS — K219 Gastro-esophageal reflux disease without esophagitis: Secondary | ICD-10-CM | POA: Diagnosis not present

## 2015-07-28 DIAGNOSIS — I5032 Chronic diastolic (congestive) heart failure: Secondary | ICD-10-CM | POA: Diagnosis not present

## 2015-07-28 DIAGNOSIS — E785 Hyperlipidemia, unspecified: Secondary | ICD-10-CM | POA: Diagnosis not present

## 2015-07-28 DIAGNOSIS — Z87891 Personal history of nicotine dependence: Secondary | ICD-10-CM | POA: Diagnosis not present

## 2015-07-28 DIAGNOSIS — Z79899 Other long term (current) drug therapy: Secondary | ICD-10-CM | POA: Diagnosis not present

## 2015-07-28 DIAGNOSIS — I2510 Atherosclerotic heart disease of native coronary artery without angina pectoris: Secondary | ICD-10-CM | POA: Diagnosis not present

## 2015-07-28 NOTE — ED Provider Notes (Signed)
CSN: [id number]     Arrival date & time 07/28/15  1558 History   First MD Initiated Contact with Patient 07/28/15 1605     Chief Complaint  Patient presents with  . Post-op Problem      HPI  Patient presents with postop complication He had left hydrocelectomy on 07/20/15 without complications Today he noted that it started "leaking" No fever/vomiting No scrotal pain reported No redness reported He did not call urologist he came straight to the ER  Past Medical History  Diagnosis Date  . Hypertension   . Chronic diastolic heart failure     EF 40-98%, grade 1 diastolic dysfunction; no WMAs, echo, 11/2011; s/p pulmo. edema c/b VDRF 11/12; Echo 09/08/11: severe LVH, no SAM, no LVOT gradient, EF 55%, mild BAE, mild reduced RVSF.  [name] CAD (coronary artery disease)     LHC 10/12:  mild plaque in the LAD but no obstructive CAD  . Diabetes mellitus     A1C 7.3 08/2011  . CKD (chronic kidney disease)   . HLD (hyperlipidemia)   . Thoracic aortic aneurysm     CT in 10/12: 4.4 cm ascending thoracic aortic aneurysm  . Myocardial infarction 07/2011  . History of right ACA stroke     10/12  --  MRI 09/13/11:  Acute right ACA infarct involving corpus callosum.  MRA 09/13/11: severe intracranial ASD, right ACA occluded, left PCA and right MCA with severe disease.  Dopplers were neg for significant ICA stenosis  . Shortness of breath dyspnea     with exertion  . Tongue tied     "at times I get tongue tied since the stroke"  . Amputation finger     left ring finger  . GERD (gastroesophageal reflux disease)   . Constipation     "due to medication"  . History of kidney stones   . History of transfusion   . Spermatocele   . Depression   . Anxiety    Past Surgical History  Procedure Laterality Date  .  vasectomy      bilateral  . Cardiac catheterization  09/09/11  . Rotator cuff repair  07/2015    LEFT  . Spermatocelectomy  1990's    RT side  . Spermatocelectomy Left 07/20/2015   Procedure: LEFT HYDROCELECTOMY;  Surgeon: Bjorn Pippin, MD;  Location: [facility];  Service: Urology;  Laterality: Left;  . Transurethral incision of prostate N/A 07/20/2015    Procedure: TRANSURETHRAL INCISION OF THE PROSTATE (TUIP);  Surgeon: Bjorn Pippin, MD;  Location: [facility];  Service: Urology;  Laterality: N/A;   History reviewed. No pertinent family history. Social History  Substance Use Topics  . Smoking status: Former Smoker -- 1.00 packs/day for 30 years    Types: Cigarettes    Quit date: 11/13/1994  . Smokeless tobacco: Never Used  . Alcohol Use: No    Review of Systems  Constitutional: Negative for fever.  Skin: Positive for wound.      Allergies  Review of patient's allergies indicates no known allergies.  Home Medications   Prior to Admission medications   Medication Sig Start Date End Date Taking? Authorizing Provider  acebutolol (SECTRAL) 200 MG capsule TAKE ONE CAPSULE BY MOUTH TWICE DAILY 06/11/15   Laqueta Linden, MD  amLODipine (NORVASC) 10 MG tablet Take 1 tablet (10 mg total) by mouth daily. Patient taking differently: Take 10 mg by mouth every morning.  09/18/12   Rande Brunt, PA-C  aspirin 81 MG EC tablet  Take 81 mg by mouth daily. 09/19/11   Rhonda G Barrett, PA-C  atorvastatin (LIPITOR) 40 MG tablet Take 1 tablet (40 mg total) by mouth every evening. 09/18/12   Rande Brunt, PA-C  busPIRone (BUSPAR) 5 MG tablet Take 5 mg by mouth 3 (three) times daily.    Historical Provider, MD  cetirizine (ZYRTEC) 10 MG tablet Take 10 mg by mouth every morning.    Historical Provider, MD  cholecalciferol (VITAMIN D) 1000 UNITS tablet Take 1,000 Units by mouth every morning.    Historical Provider, MD  citalopram (CELEXA) 20 MG tablet Take 20 mg by mouth every morning.     Historical Provider, MD  docusate sodium (COLACE) 100 MG capsule Take 1 capsule (100 mg total) by mouth 2 (two) times daily. 07/20/15   Bjorn Pippin, MD  ferrous sulfate (IRON SUPPLEMENT) 325 (65 FE) MG  tablet Take 325 mg by mouth at bedtime.     Historical Provider, MD  furosemide (LASIX) 40 MG tablet TAKE ONE TABLET BY MOUTH TWICE DAILY 05/11/15   Laqueta Linden, MD  glipiZIDE (GLUCOTROL) 10 MG tablet Take 10 mg by mouth 2 (two) times daily before a meal.    Historical Provider, MD  HYDROcodone-acetaminophen (NORCO) 5-325 MG per tablet Take 1 tablet by mouth every 6 (six) hours as needed for moderate pain. 07/20/15   Bjorn Pippin, MD  ibuprofen (ADVIL,MOTRIN) 800 MG tablet Take 800 mg by mouth every 8 (eight) hours as needed for pain.    Historical Provider, MD  isosorbide mononitrate (IMDUR) 30 MG 24 hr tablet Take 1 tablet (30 mg total) by mouth daily. 09/18/12   Rande Brunt, PA-C  lisinopril (PRINIVIL,ZESTRIL) 20 MG tablet Take 1 tablet (20 mg total) by mouth daily. 05/14/15   Laqueta Linden, MD  nitroGLYCERIN (NITROSTAT) 0.4 MG SL tablet Place 1 tablet (0.4 mg total) under the tongue every 5 (five) minutes as needed for chest pain. 09/18/12   Rande Brunt, PA-C  Omega-3 Fatty Acids (FISH OIL) 1000 MG CAPS Take 2 capsules by mouth every morning.    Historical Provider, MD  pantoprazole (PROTONIX) 40 MG tablet Take 40 mg by mouth every morning.    Historical Provider, MD  sitaGLIPtin-metformin (JANUMET) 50-1000 MG per tablet Take 1 tablet by mouth 2 (two) times daily with a meal.    Historical Provider, MD  spironolactone (ALDACTONE) 25 MG tablet TAKE ONE TABLET BY MOUTH ONCE DAILY 05/07/15   Laqueta Linden, MD   BP 139/83 mmHg  Pulse 70  Temp(Src) 98.2 F (36.8 C) (Oral)  Resp 18  Ht 5\' 11"  (1.803 m)  Wt 234 lb (106.142 kg)  BMI 32.65 kg/m2  SpO2 98% Physical Exam CONSTITUTIONAL: Well developed/well nourished HEAD: Normocephalic/atraumatic EYES: EOMI ENMT: Mucous membranes moist NECK: supple no meningeal signs ABDOMEN: soft ZO:XWRU healing wound to left scrotum without erythema.  Small amt of clear fluid is noted.  No bleeding.  No tenderness is noted.  No crepitus is  noted.  The wound is well approximated NEURO: Pt is awake/alert/appropriate EXTREMITIES:  full ROM SKIN: warm, color normal PSYCH: no abnormalities of mood noted, alert and oriented to situation  ED Course  Procedures   MDM   Final diagnoses:  Other postoperative complication involving genitourinary system    Nursing notes including past medical history and social history reviewed and considered in documentation  Pt well appearing He is 8 days postop and wound is healing well without signs of infection He is  nontoxic in appearance There is no dehiscence in wound Advised nursing to place dressing to wound He will call urologist tomorrow     Zadie Rhine, MD 07/28/15 [phone]

## 2015-07-28 NOTE — Discharge Instructions (Signed)
Call your doctor tomorrow Return for worsened pain, redness or streaking in wound, any fever above 100.51F over next 24 hours

## 2015-07-28 NOTE — ED Notes (Signed)
Pt with leaking fluid from sutures after having surgery to testicles a week ago

## 2015-08-18 ENCOUNTER — Other Ambulatory Visit: Payer: Self-pay | Admitting: Cardiovascular Disease

## 2015-08-18 ENCOUNTER — Other Ambulatory Visit: Payer: Self-pay | Admitting: *Deleted

## 2015-08-18 MED ORDER — ISOSORBIDE MONONITRATE ER 30 MG PO TB24: 30.0000 mg | ORAL_TABLET | Freq: Every day | ORAL | Status: DC

## 2015-08-18 NOTE — Telephone Encounter (Signed)
Medication sent to pharmacy  

## 2015-08-18 NOTE — Telephone Encounter (Signed)
isosorbide mononitrate (IMDUR) 30 MG 24 hr Please send to [facility] in [place] - stated that the Health department has been filling RX in past

## 2015-09-12 ENCOUNTER — Other Ambulatory Visit: Payer: Self-pay | Admitting: Cardiovascular Disease

## 2015-09-27 ENCOUNTER — Encounter (HOSPITAL_COMMUNITY): Payer: Self-pay | Admitting: *Deleted

## 2015-09-27 ENCOUNTER — Emergency Department (HOSPITAL_COMMUNITY)
Admission: EM | Admit: 2015-09-27 | Discharge: 2015-09-27 | Disposition: A | Discharge status: Home / Self Care | Payer: Medicaid Other | Attending: Emergency Medicine | Admitting: Emergency Medicine

## 2015-09-27 DIAGNOSIS — I252 Old myocardial infarction: Secondary | ICD-10-CM | POA: Diagnosis not present

## 2015-09-27 DIAGNOSIS — I129 Hypertensive chronic kidney disease with stage 1 through stage 4 chronic kidney disease, or unspecified chronic kidney disease: Secondary | ICD-10-CM | POA: Diagnosis not present

## 2015-09-27 DIAGNOSIS — L02211 Cutaneous abscess of abdominal wall: Secondary | ICD-10-CM | POA: Diagnosis present

## 2015-09-27 DIAGNOSIS — Z87442 Personal history of urinary calculi: Secondary | ICD-10-CM | POA: Diagnosis not present

## 2015-09-27 DIAGNOSIS — L0291 Cutaneous abscess, unspecified: Secondary | ICD-10-CM

## 2015-09-27 DIAGNOSIS — F329 Major depressive disorder, single episode, unspecified: Secondary | ICD-10-CM | POA: Diagnosis not present

## 2015-09-27 DIAGNOSIS — Z7982 Long term (current) use of aspirin: Secondary | ICD-10-CM | POA: Diagnosis not present

## 2015-09-27 DIAGNOSIS — I2510 Atherosclerotic heart disease of native coronary artery without angina pectoris: Secondary | ICD-10-CM | POA: Diagnosis not present

## 2015-09-27 DIAGNOSIS — E785 Hyperlipidemia, unspecified: Secondary | ICD-10-CM | POA: Diagnosis not present

## 2015-09-27 DIAGNOSIS — L03311 Cellulitis of abdominal wall: Secondary | ICD-10-CM | POA: Insufficient documentation

## 2015-09-27 DIAGNOSIS — K5900 Constipation, unspecified: Secondary | ICD-10-CM | POA: Diagnosis not present

## 2015-09-27 DIAGNOSIS — F419 Anxiety disorder, unspecified: Secondary | ICD-10-CM | POA: Diagnosis not present

## 2015-09-27 DIAGNOSIS — K219 Gastro-esophageal reflux disease without esophagitis: Secondary | ICD-10-CM | POA: Diagnosis not present

## 2015-09-27 DIAGNOSIS — Z8673 Personal history of transient ischemic attack (TIA), and cerebral infarction without residual deficits: Secondary | ICD-10-CM | POA: Diagnosis not present

## 2015-09-27 DIAGNOSIS — N189 Chronic kidney disease, unspecified: Secondary | ICD-10-CM | POA: Insufficient documentation

## 2015-09-27 DIAGNOSIS — Z79899 Other long term (current) drug therapy: Secondary | ICD-10-CM | POA: Diagnosis not present

## 2015-09-27 DIAGNOSIS — E119 Type 2 diabetes mellitus without complications: Secondary | ICD-10-CM | POA: Diagnosis not present

## 2015-09-27 DIAGNOSIS — L0390 Cellulitis, unspecified: Secondary | ICD-10-CM

## 2015-09-27 DIAGNOSIS — Z87891 Personal history of nicotine dependence: Secondary | ICD-10-CM | POA: Insufficient documentation

## 2015-09-27 DIAGNOSIS — I5032 Chronic diastolic (congestive) heart failure: Secondary | ICD-10-CM | POA: Insufficient documentation

## 2015-09-27 LAB — CBG MONITORING, ED: Glucose-Capillary: 230 mg/dL — ABNORMAL HIGH (ref 65–99)

## 2015-09-27 MED ORDER — SULFAMETHOXAZOLE-TRIMETHOPRIM 800-160 MG PO TABS
1.0000 | ORAL_TABLET | Freq: Once | ORAL | Status: AC
  Administered 2015-09-27: 1 via ORAL
  Filled 2015-09-27: qty 1

## 2015-09-27 MED ORDER — HYDROCODONE-ACETAMINOPHEN 5-325 MG PO TABS: 1.0000 | ORAL_TABLET | Freq: Four times a day (QID) | ORAL | Status: DC | PRN

## 2015-09-27 MED ORDER — SULFAMETHOXAZOLE-TRIMETHOPRIM 800-160 MG PO TABS: 1.0000 | ORAL_TABLET | Freq: Two times a day (BID) | ORAL | Status: AC

## 2015-09-27 MED FILL — Hydrocodone-Acetaminophen Tab 5-325 MG: ORAL | Qty: 6 | Status: AC

## 2015-09-27 NOTE — ED Provider Notes (Signed)
TIME SEEN: 2:10 AM  CHIEF COMPLAINT: Abscess  HPI: Pt is a 59 y.o. male with history of hypertension, non-insulin-dependent diabetes, hyperlipidemia, chronic kidney disease, coronary artery disease, CHF who presents emergency department with a small abscess to the left lower abdomen for the past 2-3 days. States he was able to pop the abscess and he had some yellow, clear drainage. Has this at the redness and warmth around the skin however has been increasing. No fevers. No vomiting. States his blood glucose has been in the mid 200s which is normal for him. States he is otherwise feeling well.  ROS: See HPI Constitutional: no fever  Eyes: no drainage  ENT: no runny nose   Cardiovascular:  no chest pain  Resp: no SOB  GI: no vomiting GU: no dysuria Integumentary: no rash  Allergy: no hives  Musculoskeletal: no leg swelling  Neurological: no slurred speech ROS otherwise negative  PAST MEDICAL HISTORY/PAST SURGICAL HISTORY:  Past Medical History  Diagnosis Date  . Hypertension   . Chronic diastolic heart failure (HCC)     EF 55-60%, grade 1 diastolic dysfunction; no WMAs, echo, 11/2011; s/p pulmo. edema c/b VDRF 11/12; Echo 09/08/11: severe LVH, no SAM, no LVOT gradient, EF 55%, mild BAE, mild reduced RVSF.  [name]. CAD (coronary artery disease)     LHC 10/12:  mild plaque in the LAD but no obstructive CAD  . Diabetes mellitus     A1C 7.3 08/2011  . CKD (chronic kidney disease)   . HLD (hyperlipidemia)   . Thoracic aortic aneurysm (HCC)     CT in 10/12: 4.4 cm ascending thoracic aortic aneurysm  . Myocardial infarction (HCC) 07/2011  . History of right ACA stroke     10/12  --  MRI 09/13/11:  Acute right ACA infarct involving corpus callosum.  MRA 09/13/11: severe intracranial ASD, right ACA occluded, left PCA and right MCA with severe disease.  Dopplers were neg for significant ICA stenosis  . Shortness of breath dyspnea     with exertion  . Tongue tied     "at times I get tongue tied  since the stroke"  . Amputation finger     left ring finger  . GERD (gastroesophageal reflux disease)   . Constipation     "due to medication"  . History of kidney stones   . History of transfusion   . Spermatocele   . Depression   . Anxiety     MEDICATIONS:  Prior to Admission medications   Medication Sig Start Date End Date Taking? Authorizing Provider  acebutolol (SECTRAL) 200 MG capsule TAKE ONE CAPSULE BY MOUTH TWICE DAILY 06/11/15   Laqueta LindenSuresh A Koneswaran, MD  amLODipine (NORVASC) 10 MG tablet Take 1 tablet (10 mg total) by mouth daily. Patient taking differently: Take 10 mg by mouth every morning.  09/18/12   Rande BruntEugene C Serpe, PA-C  aspirin 81 MG EC tablet Take 81 mg by mouth daily. 09/19/11   Rhonda G Barrett, PA-C  atorvastatin (LIPITOR) 40 MG tablet Take 1 tablet (40 mg total) by mouth every evening. 09/18/12   Rande BruntEugene C Serpe, PA-C  busPIRone (BUSPAR) 5 MG tablet Take 5 mg by mouth 3 (three) times daily.    Historical Provider, MD  cetirizine (ZYRTEC) 10 MG tablet Take 10 mg by mouth every morning.    Historical Provider, MD  cholecalciferol (VITAMIN D) 1000 UNITS tablet Take 1,000 Units by mouth every morning.    Historical Provider, MD  citalopram (CELEXA) 20 MG tablet  Take 20 mg by mouth every morning.     Historical Provider, MD  docusate sodium (COLACE) 100 MG capsule Take 1 capsule (100 mg total) by mouth 2 (two) times daily. 07/20/15   Bjorn Pippin, MD  ferrous sulfate (IRON SUPPLEMENT) 325 (65 FE) MG tablet Take 325 mg by mouth at bedtime.     Historical Provider, MD  furosemide (LASIX) 40 MG tablet TAKE ONE TABLET BY MOUTH TWICE DAILY 05/11/15   Laqueta Linden, MD  glipiZIDE (GLUCOTROL) 10 MG tablet Take 10 mg by mouth 2 (two) times daily before a meal.    Historical Provider, MD  HYDROcodone-acetaminophen (NORCO) 5-325 MG per tablet Take 1 tablet by mouth every 6 (six) hours as needed for moderate pain. 07/20/15   Bjorn Pippin, MD  ibuprofen (ADVIL,MOTRIN) 800 MG tablet Take 800  mg by mouth every 8 (eight) hours as needed for pain.    Historical Provider, MD  isosorbide mononitrate (IMDUR) 30 MG 24 hr tablet Take 1 tablet (30 mg total) by mouth daily. 08/18/15   Laqueta Linden, MD  lisinopril (PRINIVIL,ZESTRIL) 20 MG tablet Take 1 tablet (20 mg total) by mouth daily. 05/14/15   Laqueta Linden, MD  nitroGLYCERIN (NITROSTAT) 0.4 MG SL tablet Place 1 tablet (0.4 mg total) under the tongue every 5 (five) minutes as needed for chest pain. 09/18/12   Rande Brunt, PA-C  Omega-3 Fatty Acids (FISH OIL) 1000 MG CAPS Take 2 capsules by mouth every morning.    Historical Provider, MD  pantoprazole (PROTONIX) 40 MG tablet Take 40 mg by mouth every morning.    Historical Provider, MD  sitaGLIPtin-metformin (JANUMET) 50-1000 MG per tablet Take 1 tablet by mouth 2 (two) times daily with a meal.    Historical Provider, MD  spironolactone (ALDACTONE) 25 MG tablet TAKE ONE TABLET BY MOUTH ONCE DAILY 09/13/15   Laqueta Linden, MD    ALLERGIES:  No Known Allergies  SOCIAL HISTORY:  Social History  Substance Use Topics  . Smoking status: Former Smoker -- 1.00 packs/day for 30 years    Types: Cigarettes    Quit date: 11/13/1994  . Smokeless tobacco: Never Used  . Alcohol Use: No    FAMILY HISTORY: History reviewed. No pertinent family history.  EXAM: BP 154/82 mmHg  Pulse 68  Temp(Src) 98.2 F (36.8 C) (Oral)  Resp 18  Ht  (1.803 m)  Wt 232 lb (105.235 kg)  BMI 32.37 kg/m2  SpO2 96% CONSTITUTIONAL: Alert and oriented and responds appropriately to questions. Well-appearing; well-nourished, smiling, afebrile, nontoxic HEAD: Normocephalic EYES: Conjunctivae clear, PERRL ENT: normal nose; no rhinorrhea; moist mucous membranes; pharynx without lesions noted NECK: Supple, no meningismus, no LAD  CARD: RRR; S1 and S2 appreciated; no murmurs, no clicks, no rubs, no gallops RESP: Normal chest excursion without splinting or tachypnea; breath sounds clear and  equal bilaterally; no wheezes, no rhonchi, no rales, no hypoxia or respiratory distress, speaking full sentences ABD/GI: Normal bowel sounds; non-distended; soft, non-tender, no rebound, no guarding, no peritoneal signs; patient has a 1 x 1 cm indurated area to the left lower abdomen with 4-5 cm of surrounding erythema and warmth, no fluctuance or active drainage but there is a small opening in the central of the indurated area with a small scab BACK:  The back appears normal and is non-tender to palpation, there is no CVA tenderness EXT: Normal ROM in all joints; non-tender to palpation; no edema; normal capillary refill; no cyanosis, no calf tenderness  or swelling    SKIN: Normal color for age and race; warm, no petechiae or purpura, no desquamation or blisters, no vesicular lesions NEURO: Moves all extremities equally, sensation to light touch intact diffusely, cranial nerves II through XII intact PSYCH: The patient's mood and manner are appropriate. Grooming and personal hygiene are appropriate.  MEDICAL DECISION MAKING: Patient here with a small superficial abscess to the left lower abdomen with surrounding cellulitis. Abscess is extremely small and I do not feel will benefit from incision and drainage as it is already draining and there is no area of fluctuance on exam. Will place patient on Bactrim. Will outline area of cellulitis. CBG is 230 in ED.  I do not suspect abscess is deep, appears very superficial on exam. He is very well-appearing, afebrile, nontoxic, smiling. He appears gray comfortable with normal vital signs. I feel he is safe to be discharged home. He has outpatient follow-up. Have advised him to follow-up with his PCP in 48 hours for recheck. Have discussed strict return precautions. He verbalizes understanding and is comfortable with this plan.       Layla Maw Mattheus Rauls, DO 09/27/15 [phone]

## 2015-09-27 NOTE — ED Notes (Signed)
Used permanent marker to mark border of redness

## 2015-09-27 NOTE — Discharge Instructions (Signed)
Cellulitis °Cellulitis is an infection of the skin and the tissue beneath it. The infected area is usually red and tender. Cellulitis occurs most often in the arms and lower legs.  °CAUSES  °Cellulitis is caused by bacteria that enter the skin through cracks or cuts in the skin. The most common types of bacteria that cause cellulitis are staphylococci and streptococci. °SIGNS AND SYMPTOMS  °· Redness and warmth. °· Swelling. °· Tenderness or pain. °· Fever. °DIAGNOSIS  °Your health care provider can usually determine what is wrong based on a physical exam. Blood tests may also be done. °TREATMENT  °Treatment usually involves taking an antibiotic medicine. °HOME CARE INSTRUCTIONS  °· Take your antibiotic medicine as directed by your health care provider. Finish the antibiotic even if you start to feel better. °· Keep the infected arm or leg elevated to reduce swelling. °· Apply a warm cloth to the affected area up to 4 times per day to relieve pain. °· Take medicines only as directed by your health care provider. °· Keep all follow-up visits as directed by your health care provider. °SEEK MEDICAL CARE IF:  °· You notice red streaks coming from the infected area. °· Your red area gets larger or turns dark in color. °· Your bone or joint underneath the infected area becomes painful after the skin has healed. °· Your infection returns in the same area or another area. °· You notice a swollen bump in the infected area. °· You develop new symptoms. °· You have a fever. °SEEK IMMEDIATE MEDICAL CARE IF:  °· You feel very sleepy. °· You develop vomiting or diarrhea. °· You have a general ill feeling (malaise) with muscle aches and pains. °  °This information is not intended to replace advice given to you by your health care provider. Make sure you discuss any questions you have with your health care provider. °  °Document Released: 08/09/2005 Document Revised: 07/21/2015 Document Reviewed: 01/15/2012 °Elsevier Interactive  Patient Education ©2016 [employer] ° °Abscess °An abscess is an infected area that contains a collection of pus and debris. It can occur in almost any part of the body. An abscess is also known as a furuncle or boil. °CAUSES  °An abscess occurs when tissue gets infected. This can occur from blockage of oil or sweat glands, infection of hair follicles, or a minor injury to the skin. As the body tries to fight the infection, pus collects in the area and creates pressure under the skin. This pressure causes pain. People with weakened immune systems have difficulty fighting infections and get certain abscesses more often.  °SYMPTOMS °Usually an abscess develops on the skin and becomes a painful mass that is red, warm, and tender. If the abscess forms under the skin, you may feel a moveable soft area under the skin. Some abscesses break open (rupture) on their own, but most will continue to get worse without care. The infection can spread deeper into the body and eventually into the bloodstream, causing you to feel ill.  °DIAGNOSIS  °Your caregiver will take your medical history and perform a physical exam. A sample of fluid may also be taken from the abscess to determine what is causing your infection. °TREATMENT  °Your caregiver may prescribe antibiotic medicines to fight the infection. However, taking antibiotics alone usually does not cure an abscess. Your caregiver may need to make a small cut (incision) in the abscess to drain the pus. In some cases, gauze is packed into the abscess to reduce   pain and to continue draining the area. °HOME CARE INSTRUCTIONS  °· Only take over-the-counter or prescription medicines for pain, discomfort, or fever as directed by your caregiver. °· If you were prescribed antibiotics, take them as directed. Finish them even if you start to feel better. °· If gauze is used, follow your caregiver's directions for changing the gauze. °· To avoid spreading the infection: °¨ Keep your  draining abscess covered with a bandage. °¨ Wash your hands well. °¨ Do not share personal care items, towels, or whirlpools with others. °¨ Avoid skin contact with others. °· Keep your skin and clothes clean around the abscess. °· Keep all follow-up appointments as directed by your caregiver. °SEEK MEDICAL CARE IF:  °· You have increased pain, swelling, redness, fluid drainage, or bleeding. °· You have muscle aches, chills, or a general ill feeling. °· You have a fever. °MAKE SURE YOU:  °· Understand these instructions. °· Will watch your condition. °· Will get help right away if you are not doing well or get worse. °  °This information is not intended to replace advice given to you by your health care provider. Make sure you discuss any questions you have with your health care provider. °  °Document Released: 08/09/2005 Document Revised: 04/30/2012 Document Reviewed: 01/12/2012 °Elsevier Interactive Patient Education ©2016 [employer] ° °

## 2015-09-27 NOTE — ED Notes (Signed)
Pt c/o reddened area to left lower abdomen; pt states he noticed it yesterday and some clear drainage came out of it today

## 2015-09-27 NOTE — ED Notes (Signed)
prepack given, instruction written on bottle as prescribed

## 2015-12-31 ENCOUNTER — Other Ambulatory Visit: Payer: Self-pay | Admitting: Cardiovascular Disease

## 2016-03-27 ENCOUNTER — Other Ambulatory Visit: Payer: Self-pay | Admitting: Cardiovascular Disease

## 2016-04-16 ENCOUNTER — Other Ambulatory Visit: Payer: Self-pay | Admitting: Cardiovascular Disease

## 2016-05-17 ENCOUNTER — Encounter: Payer: Self-pay | Admitting: Cardiovascular Disease

## 2016-05-17 ENCOUNTER — Ambulatory Visit (INDEPENDENT_AMBULATORY_CARE_PROVIDER_SITE_OTHER): Payer: Medicaid Other | Admitting: Cardiovascular Disease

## 2016-05-17 VITALS — BP 128/78 | HR 71 | Ht 71.0 in | Wt 238.0 lb

## 2016-05-17 DIAGNOSIS — R002 Palpitations: Secondary | ICD-10-CM

## 2016-05-17 DIAGNOSIS — I712 Thoracic aortic aneurysm, without rupture, unspecified: Secondary | ICD-10-CM

## 2016-05-17 DIAGNOSIS — I10 Essential (primary) hypertension: Secondary | ICD-10-CM | POA: Diagnosis not present

## 2016-05-17 DIAGNOSIS — I2510 Atherosclerotic heart disease of native coronary artery without angina pectoris: Secondary | ICD-10-CM

## 2016-05-17 DIAGNOSIS — I5032 Chronic diastolic (congestive) heart failure: Secondary | ICD-10-CM

## 2016-05-17 NOTE — Patient Instructions (Signed)
Your physician wants you to follow-up in: 1 YEAR WITH DR. Purvis SheffieldKONESWARAN You will receive a reminder letter in the mail two months in advance. If you don't receive a letter, please call our office to schedule the follow-up appointment.  Your physician recommends that you continue on your current medications as directed. Please refer to the Current Medication list given to you today.  Non-Cardiac CT Angiography (CTA), is a special type of CT scan that uses a computer to produce multi-dimensional views of major blood vessels throughout the body. In CT angiography, a contrast material is injected through an IV to help visualize the blood vessels  Thank you for choosing [facility]!!

## 2016-05-17 NOTE — Progress Notes (Signed)
Patient ID: Matthew Cain, male   DOB: [birth year], 60 y.o.   MRN: [id number]      SUBJECTIVE: The patient returns for followup for nonobstructive coronary artery disease, thoracic aortic aneurysm, chronic diastolic heart failure, palpitations, and essential hypertension.  ECG performed in the office today demonstrates sinus rhythm with a nonspecific ST segment and T wave abnormality in inferolateral leads, and possible old anteroseptal infarct. The ST-T abnormalities are not markedly different from ECG's performed on 05/14/15, 06/12/2014, and 2013.  CT chest from January 2016 showed stability of his thoracic aortic aneurysm at 4.3 cm.  He was hospitalized for diastolic heart failure in October 2012 and underwent coronary angiography on 09/09/2011 which demonstrated mild nonobstructive coronary artery disease.  The patient denies any symptoms of chest pain, palpitations, shortness of breath, lightheadedness, dizziness, leg swelling, orthopnea, PND, and syncope.  Trying to lose weight. Walks 1 mile daily.  Review of Systems: As per "subjective", otherwise negative.  No Known Allergies  Current Outpatient Prescriptions  Medication Sig Dispense Refill  . acebutolol (SECTRAL) 200 MG capsule TAKE ONE CAPSULE BY MOUTH TWICE DAILY 60 capsule 11  . amLODipine (NORVASC) 10 MG tablet Take 1 tablet (10 mg total) by mouth daily. (Patient taking differently: Take 10 mg by mouth every morning. ) 30 tablet 6  . aspirin 81 MG EC tablet Take 81 mg by mouth daily.    [name]. atorvastatin (LIPITOR) 40 MG tablet Take 1 tablet (40 mg total) by mouth every evening. 30 tablet 6  . busPIRone (BUSPAR) 5 MG tablet Take 5 mg by mouth 3 (three) times daily.    . cetirizine (ZYRTEC) 10 MG tablet Take 10 mg by mouth every morning.    . cholecalciferol (VITAMIN D) 1000 UNITS tablet Take 1,000 Units by mouth every morning.    . citalopram (CELEXA) 20 MG tablet Take 20 mg by mouth every morning.     . docusate sodium (COLACE) 100  MG capsule Take 1 capsule (100 mg total) by mouth 2 (two) times daily. 60 capsule 2  . ferrous sulfate (IRON SUPPLEMENT) 325 (65 FE) MG tablet Take 325 mg by mouth at bedtime.     . furosemide (LASIX) 40 MG tablet TAKE ONE TABLET BY MOUTH TWICE DAILY 60 tablet 3  . glipiZIDE (GLUCOTROL) 10 MG tablet Take 40 mg by mouth 2 (two) times daily before a meal.    . HYDROcodone-acetaminophen (NORCO/VICODIN) 5-325 MG tablet Take 1-2 tablets by mouth every 6 (six) hours as needed. 6 tablet 0  . ibuprofen (ADVIL,MOTRIN) 800 MG tablet Take 800 mg by mouth every 8 (eight) hours as needed for pain.    . isosorbide mononitrate (IMDUR) 30 MG 24 hr tablet TAKE ONE TABLET BY MOUTH ONCE DAILY 30 tablet 6  . lisinopril (PRINIVIL,ZESTRIL) 20 MG tablet Take 1 tablet (20 mg total) by mouth daily. 30 tablet 6  . nitroGLYCERIN (NITROSTAT) 0.4 MG SL tablet Place 1 tablet (0.4 mg total) under the tongue every 5 (five) minutes as needed for chest pain. 25 tablet 3  . Omega-3 Fatty Acids (FISH OIL) 1000 MG CAPS Take 2 capsules by mouth every morning.    . pantoprazole (PROTONIX) 40 MG tablet Take 40 mg by mouth every morning.    . sitaGLIPtin-metformin (JANUMET) 50-1000 MG per tablet Take 1 tablet by mouth 2 (two) times daily with a meal.    . spironolactone (ALDACTONE) 25 MG tablet TAKE ONE TABLET BY MOUTH ONCE DAILY 30 tablet 6   No current facility-administered medications  for this visit.    Past Medical History  Diagnosis Date  . Hypertension   . Chronic diastolic heart failure (HCC)     EF 55-60%, grade 1 diastolic dysfunction; no WMAs, echo, 11/2011; s/p pulmo. edema c/b VDRF 11/12; Echo 09/08/11: severe LVH, no SAM, no LVOT gradient, EF 55%, mild BAE, mild reduced RVSF.  [name]. CAD (coronary artery disease)     LHC 10/12:  mild plaque in the LAD but no obstructive CAD  . Diabetes mellitus     A1C 7.3 08/2011  . CKD (chronic kidney disease)   . HLD (hyperlipidemia)   . Thoracic aortic aneurysm (HCC)     CT in 10/12:  4.4 cm ascending thoracic aortic aneurysm  . Myocardial infarction (HCC) 07/2011  . History of right ACA stroke     10/12  --  MRI 09/13/11:  Acute right ACA infarct involving corpus callosum.  MRA 09/13/11: severe intracranial ASD, right ACA occluded, left PCA and right MCA with severe disease.  Dopplers were neg for significant ICA stenosis  . Shortness of breath dyspnea     with exertion  . Tongue tied     "at times I get tongue tied since the stroke"  . Amputation finger     left ring finger  . GERD (gastroesophageal reflux disease)   . Constipation     "due to medication"  . History of kidney stones   . History of transfusion   . Spermatocele   . Depression   . Anxiety     Past Surgical History  Procedure Laterality Date  .  vasectomy      bilateral  . Cardiac catheterization  09/09/11  . Rotator cuff repair  07/2015    LEFT  . Spermatocelectomy  1990's    RT side  . Spermatocelectomy Left 07/20/2015    Procedure: LEFT HYDROCELECTOMY;  Surgeon: Bjorn PippinJohn Wrenn, MD;  Location: [facility];  Service: Urology;  Laterality: Left;  . Transurethral incision of prostate N/A 07/20/2015    Procedure: TRANSURETHRAL INCISION OF THE PROSTATE (TUIP);  Surgeon: Bjorn PippinJohn Wrenn, MD;  Location: [facility];  Service: Urology;  Laterality: N/A;    Social History   Social History  . Marital Status: Divorced    Spouse Name: N/A  . Number of Children: N/A  . Years of Education: N/A   Occupational History  . Not on file.   Social History Main Topics  . Smoking status: Former Smoker -- 1.00 packs/day for 30 years    Types: Cigarettes    Quit date: 11/13/1994  . Smokeless tobacco: Never Used  . Alcohol Use: No  . Drug Use: No  . Sexual Activity: Not Currently   Other Topics Concern  . Not on file   Social History Narrative     Filed Vitals:   05/17/16 0805  BP: 128/78  Pulse: 71  Height: 5\' 11"  (1.803 m)  Weight: 238 lb (107.956 kg)  SpO2: 95%    PHYSICAL EXAM General: NAD HEENT:  Normal. Neck: No JVD, no thyromegaly. Lungs: Clear to auscultation bilaterally with normal respiratory effort. CV: Nondisplaced PMI.  Regular rate and rhythm, normal S1/S2, no S3/S4, no murmur. No pretibial or periankle edema.  No carotid bruit.   Abdomen: Soft, nontender, no distention.  Neurologic: Alert and oriented.  Psych: Normal affect. Skin: Normal. Musculoskeletal: No gross deformities.    ECG: Most recent ECG reviewed.      ASSESSMENT AND PLAN:  CAD  Continue aggressive medical management. No current  indication for ischemic evaluation. Patient had nonobstructive CAD by cardiac catheterization, 08/2011. The ST-T abnormalities are not markedly different from ECG's performed on 05/14/15, 06/12/2014, and 2013.  Chronic diastolic heart failure  Euvolemic by history and exam. Continue current diuretic regimen.  Palpitations  Quiescent on current medication regimen, which includes acebutolol.   Essential hypertension Controlled. No changes.  Thoracic aortic aneurysm  4.3 cm in January 2016 by CT angiogram. Will repeat  Dispo: f/u 1 year.    Prentice Docker, M.D., F.A.C.C.

## 2016-05-17 NOTE — Addendum Note (Signed)
Addended by: Burman NievesASHWORTH, Artist Bloom T on: 05/17/2016 08:30 AM   Modules accepted: Orders

## 2016-05-18 ENCOUNTER — Telehealth: Payer: Self-pay | Admitting: Cardiovascular Disease

## 2016-05-18 NOTE — Telephone Encounter (Signed)
Auth # [id number] exp 06/17/16

## 2016-05-18 NOTE — Telephone Encounter (Signed)
CT ANGIO CHEST AORTA W/CM &/OR [facility]  Checking percert

## 2016-05-24 ENCOUNTER — Ambulatory Visit (HOSPITAL_COMMUNITY)
Admission: RE | Admit: 2016-05-24 | Discharge: 2016-05-24 | Disposition: A | Discharge status: Home / Self Care | Payer: Medicaid Other | Source: Ambulatory Visit | Attending: Cardiovascular Disease | Admitting: Cardiovascular Disease

## 2016-05-24 ENCOUNTER — Other Ambulatory Visit: Payer: Self-pay | Admitting: Cardiovascular Disease

## 2016-05-24 DIAGNOSIS — I712 Thoracic aortic aneurysm, without rupture, unspecified: Secondary | ICD-10-CM

## 2016-05-24 DIAGNOSIS — K760 Fatty (change of) liver, not elsewhere classified: Secondary | ICD-10-CM | POA: Insufficient documentation

## 2016-05-24 DIAGNOSIS — I700 Atherosclerosis of aorta: Secondary | ICD-10-CM | POA: Diagnosis not present

## 2016-05-24 LAB — POCT I-STAT CREATININE: Creatinine, Ser: 1.4 mg/dL — ABNORMAL HIGH (ref 0.61–1.24)

## 2016-05-24 MED ORDER — IOPAMIDOL (ISOVUE-370) INJECTION 76%
100.0000 mL | Freq: Once | INTRAVENOUS | Status: AC | PRN
  Administered 2016-05-24: 100 mL via INTRAVENOUS

## 2016-05-29 ENCOUNTER — Telehealth: Payer: Self-pay | Admitting: *Deleted

## 2016-05-29 NOTE — Telephone Encounter (Signed)
Notes Recorded by Lesle ChrisAngela G Maxden Naji, LPN on 1/61/09607/17/2017 at 10:39 AM Patient notified. Copy to pmd.  Notes Recorded by Antoine PocheJonathan F Branch, MD on 05/26/2016 at 12:44 PM CTA shows overall stable mild aortic aneurysm. We will continue to monitor

## 2016-06-29 ENCOUNTER — Other Ambulatory Visit: Payer: Self-pay | Admitting: Cardiovascular Disease

## 2016-08-25 ENCOUNTER — Encounter (HOSPITAL_COMMUNITY): Payer: Self-pay | Admitting: Emergency Medicine

## 2016-08-25 ENCOUNTER — Observation Stay (HOSPITAL_COMMUNITY)
Admission: EM | Admit: 2016-08-25 | Discharge: 2016-08-26 | Disposition: A | Discharge status: Home / Self Care | Payer: Medicaid Other | Attending: Internal Medicine | Admitting: Internal Medicine

## 2016-08-25 ENCOUNTER — Emergency Department (HOSPITAL_COMMUNITY): Payer: Medicaid Other

## 2016-08-25 DIAGNOSIS — I712 Thoracic aortic aneurysm, without rupture: Secondary | ICD-10-CM | POA: Diagnosis present

## 2016-08-25 DIAGNOSIS — R079 Chest pain, unspecified: Principal | ICD-10-CM | POA: Insufficient documentation

## 2016-08-25 DIAGNOSIS — I214 Non-ST elevation (NSTEMI) myocardial infarction: Secondary | ICD-10-CM | POA: Diagnosis present

## 2016-08-25 DIAGNOSIS — I25119 Atherosclerotic heart disease of native coronary artery with unspecified angina pectoris: Secondary | ICD-10-CM | POA: Diagnosis not present

## 2016-08-25 DIAGNOSIS — Z8249 Family history of ischemic heart disease and other diseases of the circulatory system: Secondary | ICD-10-CM | POA: Insufficient documentation

## 2016-08-25 DIAGNOSIS — I5042 Chronic combined systolic (congestive) and diastolic (congestive) heart failure: Secondary | ICD-10-CM | POA: Diagnosis present

## 2016-08-25 DIAGNOSIS — Z8673 Personal history of transient ischemic attack (TIA), and cerebral infarction without residual deficits: Secondary | ICD-10-CM | POA: Insufficient documentation

## 2016-08-25 DIAGNOSIS — I10 Essential (primary) hypertension: Secondary | ICD-10-CM | POA: Diagnosis not present

## 2016-08-25 DIAGNOSIS — F329 Major depressive disorder, single episode, unspecified: Secondary | ICD-10-CM | POA: Diagnosis not present

## 2016-08-25 DIAGNOSIS — I2510 Atherosclerotic heart disease of native coronary artery without angina pectoris: Secondary | ICD-10-CM | POA: Diagnosis not present

## 2016-08-25 DIAGNOSIS — I700 Atherosclerosis of aorta: Secondary | ICD-10-CM | POA: Diagnosis not present

## 2016-08-25 DIAGNOSIS — Z823 Family history of stroke: Secondary | ICD-10-CM | POA: Diagnosis not present

## 2016-08-25 DIAGNOSIS — I130 Hypertensive heart and chronic kidney disease with heart failure and stage 1 through stage 4 chronic kidney disease, or unspecified chronic kidney disease: Secondary | ICD-10-CM | POA: Diagnosis not present

## 2016-08-25 DIAGNOSIS — N182 Chronic kidney disease, stage 2 (mild): Secondary | ICD-10-CM | POA: Diagnosis not present

## 2016-08-25 DIAGNOSIS — R51 Headache: Secondary | ICD-10-CM | POA: Insufficient documentation

## 2016-08-25 DIAGNOSIS — D631 Anemia in chronic kidney disease: Secondary | ICD-10-CM | POA: Diagnosis not present

## 2016-08-25 DIAGNOSIS — E785 Hyperlipidemia, unspecified: Secondary | ICD-10-CM | POA: Insufficient documentation

## 2016-08-25 DIAGNOSIS — E1122 Type 2 diabetes mellitus with diabetic chronic kidney disease: Secondary | ICD-10-CM | POA: Insufficient documentation

## 2016-08-25 DIAGNOSIS — Z87891 Personal history of nicotine dependence: Secondary | ICD-10-CM | POA: Insufficient documentation

## 2016-08-25 DIAGNOSIS — E119 Type 2 diabetes mellitus without complications: Secondary | ICD-10-CM

## 2016-08-25 DIAGNOSIS — F410 Panic disorder [episodic paroxysmal anxiety] without agoraphobia: Secondary | ICD-10-CM | POA: Diagnosis not present

## 2016-08-25 DIAGNOSIS — Z7982 Long term (current) use of aspirin: Secondary | ICD-10-CM | POA: Diagnosis not present

## 2016-08-25 DIAGNOSIS — K760 Fatty (change of) liver, not elsewhere classified: Secondary | ICD-10-CM | POA: Insufficient documentation

## 2016-08-25 DIAGNOSIS — IMO0001 Reserved for inherently not codable concepts without codable children: Secondary | ICD-10-CM | POA: Diagnosis present

## 2016-08-25 DIAGNOSIS — Z794 Long term (current) use of insulin: Secondary | ICD-10-CM | POA: Diagnosis not present

## 2016-08-25 DIAGNOSIS — Z89022 Acquired absence of left finger(s): Secondary | ICD-10-CM | POA: Diagnosis not present

## 2016-08-25 DIAGNOSIS — K219 Gastro-esophageal reflux disease without esophagitis: Secondary | ICD-10-CM | POA: Diagnosis not present

## 2016-08-25 DIAGNOSIS — Z87442 Personal history of urinary calculi: Secondary | ICD-10-CM | POA: Diagnosis not present

## 2016-08-25 DIAGNOSIS — I5032 Chronic diastolic (congestive) heart failure: Secondary | ICD-10-CM | POA: Diagnosis not present

## 2016-08-25 DIAGNOSIS — I252 Old myocardial infarction: Secondary | ICD-10-CM | POA: Diagnosis not present

## 2016-08-25 LAB — CBC
HCT: 31.8 % — ABNORMAL LOW (ref 39.0–52.0)
Hemoglobin: 9.7 g/dL — ABNORMAL LOW (ref 13.0–17.0)
MCH: 24.9 pg — ABNORMAL LOW (ref 26.0–34.0)
MCHC: 30.5 g/dL (ref 30.0–36.0)
MCV: 81.5 fL (ref 78.0–100.0)
Platelets: 312 10*3/uL (ref 150–400)
RBC: 3.9 MIL/uL — ABNORMAL LOW (ref 4.22–5.81)
RDW: 14.6 % (ref 11.5–15.5)
WBC: 6.1 10*3/uL (ref 4.0–10.5)

## 2016-08-25 LAB — BASIC METABOLIC PANEL
Anion gap: 7 (ref 5–15)
BUN: 17 mg/dL (ref 6–20)
CO2: 29 mmol/L (ref 22–32)
Calcium: 8.9 mg/dL (ref 8.9–10.3)
Chloride: 101 mmol/L (ref 101–111)
Creatinine, Ser: 1.28 mg/dL — ABNORMAL HIGH (ref 0.61–1.24)
GFR calc Af Amer: >60 mL/min (ref >60)
GFR calc non Af Amer: 59 mL/min — ABNORMAL LOW (ref >60)
Glucose, Bld: 248 mg/dL — ABNORMAL HIGH (ref 65–99)
Potassium: 3.6 mmol/L (ref 3.5–5.1)
Sodium: 137 mmol/L (ref 135–145)

## 2016-08-25 LAB — I-STAT TROPONIN, ED: Troponin i, poc: 0 ng/mL (ref 0.00–0.08)

## 2016-08-25 LAB — GLUCOSE, CAPILLARY: Glucose-Capillary: 203 mg/dL — ABNORMAL HIGH (ref 65–99)

## 2016-08-25 LAB — TROPONIN I: Troponin I: <0.03 ng/mL (ref <0.03)

## 2016-08-25 MED ORDER — SPIRONOLACTONE 25 MG PO TABS
25.0000 mg | ORAL_TABLET | Freq: Every day | ORAL | Status: DC
  Administered 2016-08-26: 25 mg via ORAL
  Filled 2016-08-25: qty 1

## 2016-08-25 MED ORDER — ATORVASTATIN CALCIUM 40 MG PO TABS
40.0000 mg | ORAL_TABLET | Freq: Every evening | ORAL | Status: DC
  Administered 2016-08-25: 40 mg via ORAL
  Filled 2016-08-25: qty 1

## 2016-08-25 MED ORDER — PANTOPRAZOLE SODIUM 40 MG PO TBEC
40.0000 mg | DELAYED_RELEASE_TABLET | Freq: Every day | ORAL | Status: DC
  Administered 2016-08-26: 40 mg via ORAL
  Filled 2016-08-25: qty 1

## 2016-08-25 MED ORDER — GI COCKTAIL ~~LOC~~: 30.0000 mL | Freq: Four times a day (QID) | ORAL | Status: DC | PRN

## 2016-08-25 MED ORDER — LISINOPRIL 10 MG PO TABS
20.0000 mg | ORAL_TABLET | Freq: Every day | ORAL | Status: DC
  Administered 2016-08-26: 20 mg via ORAL
  Filled 2016-08-25: qty 2

## 2016-08-25 MED ORDER — NITROGLYCERIN 2 % TD OINT
1.0000 [in_us] | TOPICAL_OINTMENT | Freq: Three times a day (TID) | TRANSDERMAL | Status: DC
  Administered 2016-08-26 (×2): 1 [in_us] via TOPICAL
  Filled 2016-08-25: qty 30

## 2016-08-25 MED ORDER — ISOSORBIDE MONONITRATE ER 30 MG PO TB24
30.0000 mg | ORAL_TABLET | Freq: Every day | ORAL | Status: DC
  Administered 2016-08-26: 30 mg via ORAL
  Filled 2016-08-25: qty 1

## 2016-08-25 MED ORDER — MORPHINE SULFATE (PF) 4 MG/ML IV SOLN
4.0000 mg | INTRAVENOUS | Status: DC | PRN
  Administered 2016-08-25: 4 mg via INTRAVENOUS
  Filled 2016-08-25: qty 1

## 2016-08-25 MED ORDER — MORPHINE SULFATE (PF) 2 MG/ML IV SOLN: 2.0000 mg | INTRAVENOUS | Status: DC | PRN

## 2016-08-25 MED ORDER — INSULIN ASPART 100 UNIT/ML ~~LOC~~ SOLN
0.0000 [IU] | Freq: Every day | SUBCUTANEOUS | Status: DC
  Administered 2016-08-25: 2 [IU] via SUBCUTANEOUS

## 2016-08-25 MED ORDER — BUSPIRONE HCL 10 MG PO TABS
10.0000 mg | ORAL_TABLET | Freq: Two times a day (BID) | ORAL | Status: DC
  Administered 2016-08-25 – 2016-08-26 (×2): 10 mg via ORAL
  Filled 2016-08-25 (×2): qty 1

## 2016-08-25 MED ORDER — POLYETHYLENE GLYCOL 3350 17 GM/SCOOP PO POWD: 17.0000 g | Freq: Every day | ORAL | Status: DC

## 2016-08-25 MED ORDER — INSULIN GLARGINE 100 UNIT/ML ~~LOC~~ SOLN
30.0000 [IU] | Freq: Every day | SUBCUTANEOUS | Status: DC
  Administered 2016-08-25: 30 [IU] via SUBCUTANEOUS
  Filled 2016-08-25 (×2): qty 0.3

## 2016-08-25 MED ORDER — ASPIRIN EC 81 MG PO TBEC
81.0000 mg | DELAYED_RELEASE_TABLET | Freq: Every day | ORAL | Status: DC
  Administered 2016-08-26: 81 mg via ORAL
  Filled 2016-08-25: qty 1

## 2016-08-25 MED ORDER — ENOXAPARIN SODIUM 40 MG/0.4ML ~~LOC~~ SOLN
40.0000 mg | SUBCUTANEOUS | Status: DC
  Administered 2016-08-26: 40 mg via SUBCUTANEOUS
  Filled 2016-08-25: qty 0.4

## 2016-08-25 MED ORDER — ACETAMINOPHEN 325 MG PO TABS
650.0000 mg | ORAL_TABLET | ORAL | Status: DC | PRN
  Administered 2016-08-25 – 2016-08-26 (×2): 650 mg via ORAL
  Filled 2016-08-25 (×2): qty 2

## 2016-08-25 MED ORDER — FUROSEMIDE 40 MG PO TABS
40.0000 mg | ORAL_TABLET | Freq: Two times a day (BID) | ORAL | Status: DC
  Administered 2016-08-26: 40 mg via ORAL
  Filled 2016-08-25: qty 1

## 2016-08-25 MED ORDER — ONDANSETRON HCL 4 MG/2ML IJ SOLN: 4.0000 mg | Freq: Four times a day (QID) | INTRAMUSCULAR | Status: DC | PRN

## 2016-08-25 MED ORDER — NITROGLYCERIN 0.4 MG SL SUBL
0.4000 mg | SUBLINGUAL_TABLET | SUBLINGUAL | Status: DC | PRN
  Administered 2016-08-25 (×2): 0.4 mg via SUBLINGUAL
  Filled 2016-08-25: qty 1

## 2016-08-25 MED ORDER — METFORMIN HCL 500 MG PO TABS
1000.0000 mg | ORAL_TABLET | Freq: Two times a day (BID) | ORAL | Status: DC
  Administered 2016-08-26: 1000 mg via ORAL
  Filled 2016-08-25: qty 2

## 2016-08-25 MED ORDER — INSULIN ASPART 100 UNIT/ML ~~LOC~~ SOLN
0.0000 [IU] | Freq: Three times a day (TID) | SUBCUTANEOUS | Status: DC
Start: 2016-08-26 — End: 2016-08-26
  Administered 2016-08-26: 5 [IU] via SUBCUTANEOUS
  Administered 2016-08-26: 8 [IU] via SUBCUTANEOUS

## 2016-08-25 MED ORDER — AMLODIPINE BESYLATE 10 MG PO TABS
10.0000 mg | ORAL_TABLET | Freq: Every day | ORAL | Status: DC
Start: 2016-08-26 — End: 2016-08-26
  Administered 2016-08-26: 10 mg via ORAL
  Filled 2016-08-25: qty 1

## 2016-08-25 MED ORDER — ASPIRIN 81 MG PO CHEW
324.0000 mg | CHEWABLE_TABLET | Freq: Once | ORAL | Status: AC
  Administered 2016-08-25: 324 mg via ORAL
  Filled 2016-08-25: qty 4

## 2016-08-25 MED ORDER — ACEBUTOLOL HCL 200 MG PO CAPS
200.0000 mg | ORAL_CAPSULE | Freq: Two times a day (BID) | ORAL | Status: DC
  Administered 2016-08-25 – 2016-08-26 (×2): 200 mg via ORAL
  Filled 2016-08-25 (×2): qty 1

## 2016-08-25 MED ORDER — CITALOPRAM HYDROBROMIDE 20 MG PO TABS
20.0000 mg | ORAL_TABLET | Freq: Every day | ORAL | Status: DC
  Administered 2016-08-26: 20 mg via ORAL
  Filled 2016-08-25: qty 1

## 2016-08-25 NOTE — ED Triage Notes (Addendum)
Pt reports chest pain x 4 days. Pt states he has an aneurysm in his heart. Pt c/o pressure in his chest. Pt also c/o headache.

## 2016-08-25 NOTE — ED Notes (Signed)
Pt has hx of thoracic aortic aneurysm.

## 2016-08-25 NOTE — ED Notes (Signed)
Pt ambulated to restroom & returned to room w/ no complications. 

## 2016-08-25 NOTE — ED Notes (Signed)
Report given to [facility]. 

## 2016-08-25 NOTE — ED Notes (Signed)
Attempted to call report. Was advised nurse receiving pt will call this nurse back for report. 

## 2016-08-25 NOTE — H&P (Signed)
History and Physical  Thong Feeny ZOX:[id number] DOB: 09-25-1956 DOA: 08/25/2016  Referring physician: Dr. Clarene Duke, ED physician PCP: Tylene Fantasia., PA-C  Outpatient Specialists:   Dr Purvis Sheffield (cardiology)  Chief Complaint: chest pain  HPI: Matthew Cain is a 60 y.o. 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 Patient seen for 4 days of intermittent chest pain that occurs at rest pain starts substernally is described as pressure and radiates into the left shoulder and jaw. Has concomitant shortness of breath. No nausea and vomiting. No diaphoresis. Chest pain feels similar to when he had his previous heart attack.  Course in ED: Patient received nitroglycerin glycerin, morphine with resolution of pain.  Review of Systems:   Pt complains of headache.  Pt denies any fevers, chills, nausea, vomiting, diarrhea, constipation, abdominal pain, dyspnea on exertion, orthopnea, cough, wheezing, palpitations, headache, vision changes, lightheadedness, dizziness, melena, rectal bleeding.  Review of systems are otherwise negative  Past Medical History:  Diagnosis Date  . Amputation finger    left ring finger  . Anxiety   . CAD (coronary artery disease)    LHC 10/12:  mild plaque in the LAD but no obstructive CAD  . Chronic diastolic heart failure (HCC)    EF 55-60%, grade 1 diastolic dysfunction; no WMAs, echo, 11/2011; s/p pulmo. edema c/b VDRF 11/12; Echo 09/08/11: severe LVH, no SAM, no LVOT gradient, EF 55%, mild BAE, mild reduced RVSF.  [name] CKD (chronic kidney disease)   . Constipation    "due to medication"  . Depression   . Diabetes mellitus    A1C 7.3 08/2011  . GERD (gastroesophageal reflux disease)   . History of kidney stones   . History of right ACA stroke    10/12  --  MRI 09/13/11:  Acute right ACA infarct involving corpus callosum.  MRA  09/13/11: severe intracranial ASD, right ACA occluded, left PCA and right MCA with severe disease.  Dopplers were neg for significant ICA stenosis  . History of transfusion   . HLD (hyperlipidemia)   . Hypertension   . Myocardial infarction 07/2011  . Shortness of breath dyspnea    with exertion  . Spermatocele   . Thoracic aortic aneurysm (HCC)    CT in 10/12: 4.4 cm ascending thoracic aortic aneurysm  . Tongue tied    "at times I get tongue tied since the stroke"   Past Surgical History:  Procedure Laterality Date  .  vasectomy     bilateral  . CARDIAC CATHETERIZATION  09/09/11  . ROTATOR CUFF REPAIR  07/2015   LEFT  . SPERMATOCELECTOMY  1990's   RT side  . SPERMATOCELECTOMY Left 07/20/2015   Procedure: LEFT HYDROCELECTOMY;  Surgeon: Bjorn Pippin, MD;  Location: [facility];  Service: Urology;  Laterality: Left;  . TRANSURETHRAL INCISION OF PROSTATE N/A 07/20/2015   Procedure: TRANSURETHRAL INCISION OF THE PROSTATE (TUIP);  Surgeon: Bjorn Pippin, MD;  Location: [facility];  Service: Urology;  Laterality: N/A;   Social History:  reports that he quit smoking about 21 years ago. His smoking use included Cigarettes. He has a 30.00 pack-year smoking history. He has never used smokeless tobacco. He reports that he does not drink alcohol or use drugs. Patient lives at Home  No Known Allergies  Family History  Problem Relation Age of Onset  . Heart attack Mother   . Cerebral aneurysm Father   . Cerebral aneurysm  Other   . Diabetes Other   . Hypertension Other       Prior to Admission medications   Medication Sig Start Date End Date Taking? Authorizing Provider  acebutolol (SECTRAL) 200 MG capsule TAKE ONE CAPSULE BY MOUTH TWICE DAILY 06/29/16  Yes Laqueta Linden, MD  amLODipine (NORVASC) 10 MG tablet Take 1 tablet (10 mg total) by mouth daily. Patient taking differently: Take 10 mg by mouth every morning.  09/18/12  Yes Rande Brunt, PA-C  aspirin 81 MG EC tablet Take 81 mg by mouth daily.  09/19/11  Yes Rhonda G Barrett, PA-C  atorvastatin (LIPITOR) 40 MG tablet Take 1 tablet (40 mg total) by mouth every evening. 09/18/12  Yes Eugene C Serpe, PA-C  busPIRone (BUSPAR) 10 MG tablet Take 10 mg by mouth 2 (two) times daily.   Yes Historical Provider, MD  citalopram (CELEXA) 20 MG tablet Take 20 mg by mouth every morning.    Yes Historical Provider, MD  ferrous sulfate (IRON SUPPLEMENT) 325 (65 FE) MG tablet Take 325 mg by mouth at bedtime.    Yes Historical Provider, MD  furosemide (LASIX) 40 MG tablet TAKE ONE TABLET BY MOUTH TWICE DAILY 05/24/16  Yes Laqueta Linden, MD  ibuprofen (ADVIL,MOTRIN) 800 MG tablet Take 400-800 mg by mouth every 8 (eight) hours as needed for pain.    Yes Historical Provider, MD  Insulin Glargine (LANTUS SOLOSTAR) 100 UNIT/ML Solostar Pen Inject 30 Units into the skin daily at 10 pm.   Yes Historical Provider, MD  isosorbide mononitrate (IMDUR) 30 MG 24 hr tablet TAKE ONE TABLET BY MOUTH ONCE DAILY 03/27/16  Yes Laqueta Linden, MD  lisinopril (PRINIVIL,ZESTRIL) 20 MG tablet Take 1 tablet (20 mg total) by mouth daily. 05/14/15  Yes Laqueta Linden, MD  metFORMIN (GLUCOPHAGE) 1000 MG tablet Take 1,000 mg by mouth 2 (two) times daily.   Yes Historical Provider, MD  nitroGLYCERIN (NITROSTAT) 0.4 MG SL tablet Place 1 tablet (0.4 mg total) under the tongue every 5 (five) minutes as needed for chest pain. 09/18/12  Yes Eugene C Serpe, PA-C  pantoprazole (PROTONIX) 40 MG tablet Take 40 mg by mouth every morning.   Yes Historical Provider, MD  polyethylene glycol powder (GLYCOLAX/MIRALAX) powder Take 17 g by mouth daily.   Yes Historical Provider, MD  spironolactone (ALDACTONE) 25 MG tablet TAKE ONE TABLET BY MOUTH ONCE DAILY 04/17/16  Yes Laqueta Linden, MD    Physical Exam: BP 162/89   Pulse 67   Temp 97.8 F (36.6 C) (Temporal)   Resp 19   Ht 5\' 10"  (1.778 m)   Wt 106.6 kg (235 lb)   SpO2 95%   BMI 33.72 kg/m   General: Older black male. Awake  and alert and oriented x3. No acute cardiopulmonary distress.  HEENT: Normocephalic atraumatic.  Right and left ears normal in appearance.  Pupils equal, round, reactive to light. Extraocular muscles are intact. Sclerae anicteric and noninjected.  Moist mucosal membranes. No mucosal lesions.  Neck: Neck supple without lymphadenopathy. No carotid bruits. No masses palpated.  Cardiovascular: Regular rate with normal S1-S2 sounds. No murmurs, rubs, gallops auscultated. No JVD.  Respiratory: Good respiratory effort with no wheezes, rales, rhonchi. Lungs clear to auscultation bilaterally.  No accessory muscle use. Abdomen: Soft, nontender, nondistended. Active bowel sounds. No masses or hepatosplenomegaly  Skin: No rashes, lesions, or ulcerations.  Dry, warm to touch. 2+ dorsalis pedis and radial pulses. Musculoskeletal: No calf or leg pain. All major  joints not erythematous nontender.  No upper or lower joint deformation.  Good ROM.  No contractures  Psychiatric: Intact judgment and insight. Pleasant and cooperative. Neurologic: No focal neurological deficits. Strength is 5/5 and symmetric in upper and lower extremities.  Cranial nerves II through XII are grossly intact.           Labs on Admission: I have personally reviewed following labs and imaging studies  CBC:  Recent Labs Lab 08/25/16 1740  WBC 6.1  HGB 9.7*  HCT 31.8*  MCV 81.5  PLT 312   Basic Metabolic Panel:  Recent Labs Lab 08/25/16 1740  NA 137  K 3.6  CL 101  CO2 29  GLUCOSE 248*  BUN 17  CREATININE 1.28*  CALCIUM 8.9   GFR: Estimated Creatinine Clearance: 75 mL/min (by C-G formula based on SCr of 1.28 mg/dL (H)). Liver Function Tests: No results for input(s): AST, ALT, ALKPHOS, BILITOT, PROT, ALBUMIN in the last 168 hours. No results for input(s): LIPASE, AMYLASE in the last 168 hours. No results for input(s): AMMONIA in the last 168 hours. Coagulation Profile: No results for input(s): INR, PROTIME in the  last 168 hours. Cardiac Enzymes: No results for input(s): CKTOTAL, CKMB, CKMBINDEX, TROPONINI in the last 168 hours. BNP (last 3 results) No results for input(s): PROBNP in the last 8760 hours. HbA1C: No results for input(s): HGBA1C in the last 72 hours. CBG: No results for input(s): GLUCAP in the last 168 hours. Lipid Profile: No results for input(s): CHOL, HDL, LDLCALC, TRIG, CHOLHDL, LDLDIRECT in the last 72 hours. Thyroid Function Tests: No results for input(s): TSH, T4TOTAL, FREET4, T3FREE, THYROIDAB in the last 72 hours. Anemia Panel: No results for input(s): VITAMINB12, FOLATE, FERRITIN, TIBC, IRON, RETICCTPCT in the last 72 hours. Urine analysis:    Component Value Date/Time   COLORURINE YELLOW 09/10/2011 1343   APPEARANCEUR CLEAR 09/10/2011 1343   LABSPEC 1.008 09/10/2011 1343   PHURINE 7.0 09/10/2011 1343   GLUCOSEU NEGATIVE 09/10/2011 1343   HGBUR TRACE (A) 09/10/2011 1343   BILIRUBINUR NEGATIVE 09/10/2011 1343   KETONESUR NEGATIVE 09/10/2011 1343   PROTEINUR NEGATIVE 09/10/2011 1343   UROBILINOGEN 0.2 09/10/2011 1343   NITRITE NEGATIVE 09/10/2011 1343   LEUKOCYTESUR NEGATIVE 09/10/2011 1343   Sepsis Labs: @LABRCNTIP (procalcitonin:4,lacticidven:4) )No results found for this or any previous visit (from the past 240 hour(s)).   Radiological Exams on Admission: Dg Chest 2 View  Result Date: 08/25/2016 CLINICAL DATA:  Central chest pain and headache, 4 days duration. EXAM: CHEST  2 VIEW COMPARISON:  12/11/2015 FINDINGS: Heart size is normal. There is aortic atherosclerosis. The lungs are clear. The vascularity is normal. No effusions. No significant bone finding. IMPRESSION: No active cardiopulmonary disease.  Aortic atherosclerosis. Electronically Signed   By: Paulina FusiMark  Shogry M.D.   On: 08/25/2016 18:10    EKG: Independently reviewed. Normal sinus rhythm with left axis deviation. Nonspecific intraventricular conduction delay  Assessment/Plan: Principal Problem:    Chest pain Active Problems:   CAD (coronary artery disease), native coronary artery   HTN (hypertension)   DM (diabetes mellitus) (HCC)    This patient was discussed with the ED physician, including pertinent vitals, physical exam findings, labs, and imaging.  We also discussed care given by the ED provider.  #1 chest pain  [facility]  Continue morphine when necessary pain  Continue troponins overnight  Cardiology consult #2 coronary artery disease  Continue Lipitor #3 hypertension  Continue antihypertensives #4 diabetes  Continue metformin  Sliding-scale insulin  DVT prophylaxis: Lovenox Consultants: Cardiology Code Status: Full code Family Communication: None  Disposition Plan: Return home following hospital stay   Levie Heritage, DO [facility] Pager [phone]  If 7PM-7AM, please contact night-coverage [email or web address] Password [email or web address]

## 2016-08-25 NOTE — ED Provider Notes (Signed)
[facility] DEPT Provider Note   CSN: [id number] Arrival date & time: 08/25/16  1715     History   Chief Complaint Chief Complaint  Patient presents with  . Chest Pain    HPI Matthew Cain is a 60 y.o. male.  HPI  Pt was seen at 1810. Per pt, c/o gradual onset and persistence of multiple intermittent episodes of "chest pain" for the past 4 days. Pt describes the CP as "heaviness," located in his mid-chest and radiating into his left shoulder. Has been associated with SOB. Pt states his symptoms last approximately 1 hour before spontaneously resolving. Pt has not taken any meds to treat his pain. Pt also c/o "headache," further described as frontal and ears "pressure." Denies palpitations, no cough, no abd pain, no vomiting/diarrhea, no back pain, no focal motor weakness, no tingling/numbness in extremities, no facial droop, no slurred speech, no visual changes, no ataxia.      Past Medical History:  Diagnosis Date  . Amputation finger    left ring finger  . Anxiety   . CAD (coronary artery disease)    LHC 10/12:  mild plaque in the LAD but no obstructive CAD  . Chronic diastolic heart failure (HCC)    EF 55-60%, grade 1 diastolic dysfunction; no WMAs, echo, 11/2011; s/p pulmo. edema c/b VDRF 11/12; Echo 09/08/11: severe LVH, no SAM, no LVOT gradient, EF 55%, mild BAE, mild reduced RVSF.  [name] CKD (chronic kidney disease)   . Constipation    "due to medication"  . Depression   . Diabetes mellitus    A1C 7.3 08/2011  . GERD (gastroesophageal reflux disease)   . History of kidney stones   . History of right ACA stroke    10/12  --  MRI 09/13/11:  Acute right ACA infarct involving corpus callosum.  MRA 09/13/11: severe intracranial ASD, right ACA occluded, left PCA and right MCA with severe disease.  Dopplers were neg for significant ICA stenosis  . History of transfusion   . HLD (hyperlipidemia)   . Hypertension   . Myocardial infarction 07/2011  . Shortness of breath dyspnea    with exertion  . Spermatocele   . Thoracic aortic aneurysm (HCC)    CT in 10/12: 4.4 cm ascending thoracic aortic aneurysm  . Tongue tied    "at times I get tongue tied since the stroke"    Patient Active Problem List   Diagnosis Date Noted  . Hydrocele, left 07/21/2015  . 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 Palpitations 03/13/2012  . Panic disorder 02/13/2012  . Thoracic aortic aneurysm (HCC) 11/23/2011  . Chronic diastolic heart failure (HCC)   . CVA (cerebral infarction) 09/17/2011  . CAD (coronary artery disease), native coronary artery 09/17/2011  . HTN (hypertension) 09/17/2011  . Dyslipidemia 09/17/2011  . DM (diabetes mellitus) (HCC) 09/17/2011  . Renal insufficiency 09/17/2011  . Pneumonia 09/17/2011    Past Surgical History:  Procedure Laterality Date  .  vasectomy     bilateral  . CARDIAC CATHETERIZATION  09/09/11  . ROTATOR CUFF REPAIR  07/2015   LEFT  . SPERMATOCELECTOMY  1990's   RT side  . SPERMATOCELECTOMY Left 07/20/2015   Procedure: LEFT HYDROCELECTOMY;  Surgeon: Bjorn Pippin, MD;  Location: [facility];  Service: Urology;  Laterality: Left;  . TRANSURETHRAL INCISION OF PROSTATE N/A 07/20/2015   Procedure: TRANSURETHRAL INCISION OF THE PROSTATE (TUIP);  Surgeon: Bjorn Pippin, MD;  Location: [facility];  Service: Urology;  Laterality: N/A;  Home Medications    Prior to Admission medications   Medication Sig Start Date End Date Taking? Authorizing Provider  acebutolol (SECTRAL) 200 MG capsule TAKE ONE CAPSULE BY MOUTH TWICE DAILY 06/29/16   Laqueta LindenSuresh A Koneswaran, MD  amLODipine (NORVASC) 10 MG tablet Take 1 tablet (10 mg total) by mouth daily. Patient taking differently: Take 10 mg by mouth every morning.  09/18/12   Rande BruntEugene C Serpe, PA-C  aspirin 81 MG EC tablet Take 81 mg by mouth daily. 09/19/11   Rhonda G Barrett, PA-C  atorvastatin (LIPITOR) 40 MG tablet Take 1 tablet (40 mg total) by mouth every evening. 09/18/12   Rande BruntEugene C Serpe, PA-C    busPIRone (BUSPAR) 5 MG tablet Take 5 mg by mouth 3 (three) times daily.    Historical Provider, MD  cetirizine (ZYRTEC) 10 MG tablet Take 10 mg by mouth every morning.    Historical Provider, MD  cholecalciferol (VITAMIN D) 1000 UNITS tablet Take 1,000 Units by mouth every morning.    Historical Provider, MD  citalopram (CELEXA) 20 MG tablet Take 20 mg by mouth every morning.     Historical Provider, MD  docusate sodium (COLACE) 100 MG capsule Take 1 capsule (100 mg total) by mouth 2 (two) times daily. 07/20/15   Bjorn PippinJohn Wrenn, MD  ferrous sulfate (IRON SUPPLEMENT) 325 (65 FE) MG tablet Take 325 mg by mouth at bedtime.     Historical Provider, MD  furosemide (LASIX) 40 MG tablet TAKE ONE TABLET BY MOUTH TWICE DAILY 05/24/16   Laqueta LindenSuresh A Koneswaran, MD  glipiZIDE (GLUCOTROL) 10 MG tablet Take 40 mg by mouth 2 (two) times daily before a meal.    Historical Provider, MD  HYDROcodone-acetaminophen (NORCO/VICODIN) 5-325 MG tablet Take 1-2 tablets by mouth every 6 (six) hours as needed. 09/27/15   Kristen N Ward, DO  ibuprofen (ADVIL,MOTRIN) 800 MG tablet Take 800 mg by mouth every 8 (eight) hours as needed for pain.    Historical Provider, MD  isosorbide mononitrate (IMDUR) 30 MG 24 hr tablet TAKE ONE TABLET BY MOUTH ONCE DAILY 03/27/16   Laqueta LindenSuresh A Koneswaran, MD  lisinopril (PRINIVIL,ZESTRIL) 20 MG tablet Take 1 tablet (20 mg total) by mouth daily. 05/14/15   Laqueta LindenSuresh A Koneswaran, MD  nitroGLYCERIN (NITROSTAT) 0.4 MG SL tablet Place 1 tablet (0.4 mg total) under the tongue every 5 (five) minutes as needed for chest pain. 09/18/12   Rande BruntEugene C Serpe, PA-C  Omega-3 Fatty Acids (FISH OIL) 1000 MG CAPS Take 2 capsules by mouth every morning.    Historical Provider, MD  pantoprazole (PROTONIX) 40 MG tablet Take 40 mg by mouth every morning.    Historical Provider, MD  sitaGLIPtin-metformin (JANUMET) 50-1000 MG per tablet Take 1 tablet by mouth 2 (two) times daily with a meal.    Historical Provider, MD  spironolactone  (ALDACTONE) 25 MG tablet TAKE ONE TABLET BY MOUTH ONCE DAILY 04/17/16   Laqueta LindenSuresh A Koneswaran, MD    Family History Family History  Problem Relation Age of Onset  . Heart attack Mother   . Cerebral aneurysm Father   . Cerebral aneurysm Other   . Diabetes Other   . Hypertension Other     Social History Social History  Substance Use Topics  . Smoking status: Former Smoker    Packs/day: 1.00    Years: 30.00    Types: Cigarettes    Quit date: 11/13/1994  . Smokeless tobacco: Never Used  . Alcohol use No     Allergies  Review of patient's allergies indicates no known allergies.   Review of Systems Review of Systems ROS: Statement: All systems negative except as marked or noted in the HPI; Constitutional: Negative for fever and chills. ; ; Eyes: Negative for eye pain, redness and discharge. ; ; ENMT: Negative for ear pain, hoarseness, nasal congestion, sinus pressure and sore throat. ; ; Cardiovascular: +CP, SOB. Negative for palpitations, diaphoresis, and peripheral edema. ; ; Respiratory: Negative for cough, wheezing and stridor. ; ; Gastrointestinal: Negative for nausea, vomiting, diarrhea, abdominal pain, blood in stool, hematemesis, jaundice and rectal bleeding. . ; ; Genitourinary: Negative for dysuria, flank pain and hematuria. ; ; Musculoskeletal: Negative for back pain and neck pain. Negative for swelling and trauma.; ; Skin: Negative for pruritus, rash, abrasions, blisters, bruising and skin lesion.; ; Neuro: Negative for lightheadedness and neck stiffness. Negative for weakness, altered level of consciousness, altered mental status, extremity weakness, paresthesias, involuntary movement, seizure and syncope.      Physical Exam Updated Vital Signs BP 171/81 (BP Location: Left Arm)   Pulse 68   Temp 97.8 F (36.6 C) (Temporal)   Resp 18   Ht 5\' 10"  (1.778 m)   Wt 235 lb (106.6 kg)   SpO2 100%   BMI 33.72 kg/m   Physical Exam 1815: Physical examination:  Nursing notes  reviewed; Vital signs and O2 SAT reviewed;  Constitutional: Well developed, Well nourished, Well hydrated, In no acute distress; Head:  Normocephalic, atraumatic; Eyes: EOMI, PERRL, No scleral icterus; ENMT: Mouth and pharynx normal, Mucous membranes moist; Neck: Supple, Full range of motion, No lymphadenopathy; Cardiovascular: Regular rate and rhythm, No gallop; Respiratory: Breath sounds clear & equal bilaterally, No wheezes.  Speaking full sentences with ease, Normal respiratory effort/excursion; Chest: Nontender, Movement normal; Abdomen: Soft, Nontender, Nondistended, Normal bowel sounds; Genitourinary: No CVA tenderness; Extremities: Pulses normal, No tenderness, No edema, No calf edema or asymmetry.; Neuro: AA&Ox3, Major CN grossly intact. No facial droop. Speech clear. No gross focal motor or sensory deficits in extremities.; Skin: Color normal, Warm, Dry.   ED Treatments / Results  Labs (all labs ordered are listed, but only abnormal results are displayed)   EKG  EKG Interpretation  Date/Time:  Friday August 25 2016 17:21:32 EDT Ventricular Rate:  65 PR Interval:  194 QRS Duration: 120 QT Interval:  428 QTC Calculation: 445 R Axis:   -31 Text Interpretation:  Normal sinus rhythm Left axis deviation Non-specific intra-ventricular conduction delay When compared with ECG of 09/14/2011 ST-t wave abnormality is no longer Present Confirmed by [facility]  MD, Nicholos Johns [id number]) on 08/25/2016 6:02:00 PM       Radiology   Procedures Procedures (including critical care time)  Medications Ordered in ED Medications  aspirin chewable tablet 324 mg (not administered)  nitroGLYCERIN (NITROSTAT) SL tablet 0.4 mg (not administered)  morphine 4 MG/ML injection 4 mg (not administered)     Initial Impression / Assessment and Plan / ED Course  I have reviewed the triage vital signs and the nursing notes.  Pertinent labs & imaging results that were available during my care of the patient were  reviewed by me and considered in my medical decision making (see chart for details).  MDM Reviewed: previous chart, nursing note and vitals Reviewed previous: labs, ECG and CT scan Interpretation: labs, ECG and x-ray   Results for orders placed or performed during the hospital encounter of 08/25/16  Basic metabolic panel  Result Value Ref Range   Sodium 137 135 - 145 mmol/L  Potassium 3.6 3.5 - 5.1 mmol/L   Chloride 101 101 - 111 mmol/L   CO2 29 22 - 32 mmol/L   Glucose, Bld 248 (H) 65 - 99 mg/dL   BUN 17 6 - 20 mg/dL   Creatinine, Ser 1.61 (H) 0.61 - 1.24 mg/dL   Calcium 8.9 8.9 - 09.6 mg/dL   GFR calc non Af Amer 59 (L) >60 mL/min   GFR calc Af Amer >60 >60 mL/min   Anion gap 7 5 - 15  CBC  Result Value Ref Range   WBC 6.1 4.0 - 10.5 K/uL   RBC 3.90 (L) 4.22 - 5.81 MIL/uL   Hemoglobin 9.7 (L) 13.0 - 17.0 g/dL   HCT 04.5 (L) 40.9 - 81.1 %   MCV 81.5 78.0 - 100.0 fL   MCH 24.9 (L) 26.0 - 34.0 pg   MCHC 30.5 30.0 - 36.0 g/dL   RDW 91.4 78.2 - 95.6 %   Platelets 312 150 - 400 K/uL  I-stat troponin, ED  Result Value Ref Range   Troponin i, poc 0.00 0.00 - 0.08 ng/mL   Comment 3           Dg Chest 2 View Result Date: 08/25/2016 CLINICAL DATA:  Central chest pain and headache, 4 days duration. EXAM: CHEST  2 VIEW COMPARISON:  12/11/2015 FINDINGS: Heart size is normal. There is aortic atherosclerosis. The lungs are clear. The vascularity is normal. No effusions. No significant bone finding. IMPRESSION: No active cardiopulmonary disease.  Aortic atherosclerosis. Electronically Signed   By: Paulina Fusi M.D.   On: 08/25/2016 18:10    Study Result  CLINICAL DATA:  Evaluate thoracic aortic aneurysm.  EXAM: CT ANGIOGRAPHY CHEST WITH CONTRAST  TECHNIQUE: Multidetector CT imaging of the chest was performed using the standard protocol during bolus administration of intravenous contrast. Multiplanar CT image reconstructions and MIPs were obtained to evaluate the vascular  anatomy.  CONTRAST:  100 cc Isovue 370 IV  COMPARISON:  07/27/2014  FINDINGS: Cardiovascular: Heart is borderline in size. Scattered aortic calcifications. Slight dilatation of the ascending thoracic aorta measuring 4.2 cm. No dissection.  Mediastinum/Nodes: No mediastinal, hilar, or axillary adenopathy.  Lungs/Pleura: Lungs are clear. No focal airspace opacities or suspicious nodules. No effusions.  Upper Abdomen: The mild diffuse fatty infiltration of the liver. No acute findings in the upper abdomen.  Musculoskeletal: Chest Mares soft tissues are unremarkable. No acute bony abnormality.  Review of the MIP images confirms the above findings.  IMPRESSION: Slight dilatation of the ascending thoracic aorta, 4.2 cm, not significantly changed since prior study. Mild aortic atherosclerosis.  Fatty infiltration of the liver.   Electronically Signed   By: Charlett Nose M.D.   On: 05/24/2016 10:36    Results for JAYDRIEN, WASSENAAR (MRN [id number]) as of 08/25/2016 18:22  Ref. Range 09/17/2011 12:08 07/14/2015 10:35 05/24/2016 09:17 08/25/2016 17:40  BUN Latest Ref Range: 6 - 20 mg/dL 16 22 (H)  17  Creatinine Latest Ref Range: 0.61 - 1.24 mg/dL 4.69 (H) 6.29 (H) 5.28 (H) 1.28 (H)     1845:  Neuro exam intact. BUN/Cr near baseline. CT chest 05/2016 without significant change of TAA; no hx dissection, doubt same today, as pt's symptoms of have been recurrent for the past 4 days/headache constant x4 days. Pt given ASA, SL ntg and IV morphine with relief. T/C to [facility] Dr. Adrian Blackwater, case discussed, including:  HPI, pertinent PM/SHx, VS/PE, dx testing, ED course and treatment:  Agreeable to come to ED for evaluation for  transfer/admission to [facility], requests to call [facility] to consult, write temporary orders, obtain [facility] tele bed to team MCAdmits/Dr. Opyd.  T[facility] to [facility] Dr. Donnie Aho, case discussed, including:  HPI, pertinent PM/SHx, VS/PE, dx testing, ED course and treatment:  Requests to have  [facility] call [facility] Cards service after pt arrives to [facility]     Final Clinical Impressions(s) / ED Diagnoses   Final diagnoses:  None    New Prescriptions New Prescriptions   No medications on file     Samuel Jester, DO 08/29/16 1931

## 2016-08-26 ENCOUNTER — Other Ambulatory Visit: Payer: Self-pay | Admitting: Cardiology

## 2016-08-26 DIAGNOSIS — I2510 Atherosclerotic heart disease of native coronary artery without angina pectoris: Secondary | ICD-10-CM

## 2016-08-26 DIAGNOSIS — I5032 Chronic diastolic (congestive) heart failure: Secondary | ICD-10-CM

## 2016-08-26 DIAGNOSIS — I10 Essential (primary) hypertension: Secondary | ICD-10-CM

## 2016-08-26 DIAGNOSIS — R079 Chest pain, unspecified: Secondary | ICD-10-CM

## 2016-08-26 DIAGNOSIS — E119 Type 2 diabetes mellitus without complications: Secondary | ICD-10-CM

## 2016-08-26 DIAGNOSIS — I712 Thoracic aortic aneurysm, without rupture: Secondary | ICD-10-CM

## 2016-08-26 DIAGNOSIS — N182 Chronic kidney disease, stage 2 (mild): Secondary | ICD-10-CM

## 2016-08-26 DIAGNOSIS — E1122 Type 2 diabetes mellitus with diabetic chronic kidney disease: Secondary | ICD-10-CM

## 2016-08-26 LAB — GLUCOSE, CAPILLARY
Glucose-Capillary: 204 mg/dL — ABNORMAL HIGH (ref 65–99)
Glucose-Capillary: 201 mg/dL — ABNORMAL HIGH (ref 65–99)
Glucose-Capillary: 261 mg/dL — ABNORMAL HIGH (ref 65–99)

## 2016-08-26 LAB — VITAMIN B12: Vitamin B-12: 155 pg/mL — ABNORMAL LOW (ref 180–914)

## 2016-08-26 LAB — IRON AND TIBC
Iron: 20 ug/dL — ABNORMAL LOW (ref 45–182)
Saturation Ratios: 5 % — ABNORMAL LOW (ref 17.9–39.5)
TIBC: 396 ug/dL (ref 250–450)
UIBC: 376 ug/dL

## 2016-08-26 LAB — URINALYSIS, ROUTINE W REFLEX MICROSCOPIC
Bilirubin Urine: NEGATIVE
Glucose, UA: 500 mg/dL — AB
Hgb urine dipstick: NEGATIVE
Ketones, ur: NEGATIVE mg/dL
Leukocytes, UA: NEGATIVE
Nitrite: NEGATIVE
Protein, ur: NEGATIVE mg/dL
Specific Gravity, Urine: 1.017 (ref 1.005–1.030)
pH: 6 (ref 5.0–8.0)

## 2016-08-26 LAB — TROPONIN I
Troponin I: <0.03 ng/mL (ref <0.03)
Troponin I: <0.03 ng/mL (ref <0.03)

## 2016-08-26 LAB — FERRITIN: Ferritin: 7 ng/mL — ABNORMAL LOW (ref 24–336)

## 2016-08-26 MED ORDER — POLYETHYLENE GLYCOL 3350 17 G PO PACK
17.0000 g | PACK | Freq: Every day | ORAL | Status: DC
  Administered 2016-08-26: 17 g via ORAL
  Filled 2016-08-26: qty 1

## 2016-08-26 NOTE — Clinical Social Work Note (Signed)
Patient in need of cab voucher to [facility]. Patient has no funds to put towards cab and no one to pick him up. Approved by Interior and spatial designerdirector of social work. Will give cab voucher to RN to call when ready.  CSW signing off. Consult again if any social work needs arise.  Charlynn CourtSarah Masiyah Engen, CSW [phone]

## 2016-08-26 NOTE — Progress Notes (Addendum)
Pt has been discharged home. IV and telemetry box removed. Pt received discharge instructions and all questions were answered. Pt is aware that he needs to follow-up with MD for outpatient stress test. Pt left with all of his belongings. Pt ambulated off the unit and was accompanied by a nurse tech. Pt received a taxi voucher from Social Work for transportation post discharge. Pt was in no distress at time of discharge.  Berdine DanceLauren Moffitt BSN, RN

## 2016-08-26 NOTE — Consult Note (Signed)
CARDIOLOGY CONSULT NOTE     Patient ID: Matthew Cain MRN: [id number] DOB/AGE: 03/18/1956 60 y.o.  Admit date: 08/25/2016 Referring Physician Catarina Hartshorn DO Primary Physician MUSE,ROCHELLE D., PA-C Primary Cardiologist Prentice Docker MD Reason for Consultation chest pain  HPI: 60 yo BM with history of HTN with hypertensive heart disease. Minimal CAD by cath in 2012. Presents for evaluation of chest pain. Describes a 4 day history of pain initially under left clavicle radiating to his sternum. Pain is described as a pressure. No SOB, diaphoresis. Pain constant for 4 days but seemed to get worse on day 4 and he could hear his heart beat in his left ear. No aggravating or alieviating factors. Also had some pain between his shoulder blades. He does have a history of mild thoracic aortic aneurysm. CT in July 2017 showed stable 4.2 cm dilation. He reports his diabetes has been out of control. Anxious about these symptoms.  Past Medical History:  Diagnosis Date  . Amputation finger    left ring finger  . Anxiety   . CAD (coronary artery disease)    LHC 10/12:  mild plaque in the LAD but no obstructive CAD  . Chronic diastolic heart failure (HCC)    EF 55-60%, grade 1 diastolic dysfunction; no WMAs, echo, 11/2011; s/p pulmo. edema c/b VDRF 11/12; Echo 09/08/11: severe LVH, no SAM, no LVOT gradient, EF 55%, mild BAE, mild reduced RVSF.  [name] CKD (chronic kidney disease)   . Constipation    "due to medication"  . Depression   . Diabetes mellitus    A1C 7.3 08/2011  . GERD (gastroesophageal reflux disease)   . History of kidney stones   . History of right ACA stroke    10/12  --  MRI 09/13/11:  Acute right ACA infarct involving corpus callosum.  MRA 09/13/11: severe intracranial ASD, right ACA occluded, left PCA and right MCA with severe disease.  Dopplers were neg for significant ICA stenosis  . History of transfusion   . HLD (hyperlipidemia)   . Hypertension   . Myocardial infarction 07/2011    . Shortness of breath dyspnea    with exertion  . Spermatocele   . Thoracic aortic aneurysm (HCC)    CT in 10/12: 4.4 cm ascending thoracic aortic aneurysm  . Tongue tied    "at times I get tongue tied since the stroke"    Family History  Problem Relation Age of Onset  . Heart attack Mother   . Cerebral aneurysm Father   . Cerebral aneurysm Other   . Diabetes Other   . Hypertension Other     Social History   Social History  . Marital status: Divorced    Spouse name: N/A  . Number of children: N/A  . Years of education: N/A   Occupational History  . Not on file.   Social History Main Topics  . Smoking status: Former Smoker    Packs/day: 1.00    Years: 30.00    Types: Cigarettes    Quit date: 11/13/1994  . Smokeless tobacco: Never Used  . Alcohol use No  . Drug use: No  . Sexual activity: Not Currently   Other Topics Concern  . Not on file   Social History Narrative  . No narrative on file    Past Surgical History:  Procedure Laterality Date  .  vasectomy     bilateral  . CARDIAC CATHETERIZATION  09/09/11  . ROTATOR CUFF REPAIR  07/2015   LEFT  .  SPERMATOCELECTOMY  1990's   RT side  . SPERMATOCELECTOMY Left 07/20/2015   Procedure: LEFT HYDROCELECTOMY;  Surgeon: Bjorn Pippin, MD;  Location: [facility];  Service: Urology;  Laterality: Left;  . TRANSURETHRAL INCISION OF PROSTATE N/A 07/20/2015   Procedure: TRANSURETHRAL INCISION OF THE PROSTATE (TUIP);  Surgeon: Bjorn Pippin, MD;  Location: [facility];  Service: Urology;  Laterality: N/A;     Prescriptions Prior to Admission  Medication Sig Dispense Refill Last Dose  . acebutolol (SECTRAL) 200 MG capsule TAKE ONE CAPSULE BY MOUTH TWICE DAILY 60 capsule 11 08/25/2016 at Unknown time  . amLODipine (NORVASC) 10 MG tablet Take 1 tablet (10 mg total) by mouth daily. (Patient taking differently: Take 10 mg by mouth every morning. ) 30 tablet 6 08/25/2016 at Unknown time  . aspirin 81 MG EC tablet Take 81 mg by mouth daily.    08/25/2016 at Unknown time  . atorvastatin (LIPITOR) 40 MG tablet Take 1 tablet (40 mg total) by mouth every evening. 30 tablet 6 08/24/2016 at Unknown time  . busPIRone (BUSPAR) 10 MG tablet Take 10 mg by mouth 2 (two) times daily.   08/25/2016 at Unknown time  . citalopram (CELEXA) 20 MG tablet Take 20 mg by mouth every morning.    08/25/2016 at Unknown time  . ferrous sulfate (IRON SUPPLEMENT) 325 (65 FE) MG tablet Take 325 mg by mouth at bedtime.    08/25/2016 at Unknown time  . furosemide (LASIX) 40 MG tablet TAKE ONE TABLET BY MOUTH TWICE DAILY 60 tablet 6 08/25/2016 at Unknown time  . ibuprofen (ADVIL,MOTRIN) 800 MG tablet Take 400-800 mg by mouth every 8 (eight) hours as needed for pain.    08/25/2016 at Unknown time  . Insulin Glargine (LANTUS SOLOSTAR) 100 UNIT/ML Solostar Pen Inject 30 Units into the skin daily at 10 pm.   08/24/2016 at Unknown time  . isosorbide mononitrate (IMDUR) 30 MG 24 hr tablet TAKE ONE TABLET BY MOUTH ONCE DAILY 30 tablet 6 08/25/2016 at Unknown time  . lisinopril (PRINIVIL,ZESTRIL) 20 MG tablet Take 1 tablet (20 mg total) by mouth daily. 30 tablet 6 08/25/2016 at Unknown time  . metFORMIN (GLUCOPHAGE) 1000 MG tablet Take 1,000 mg by mouth 2 (two) times daily.   08/25/2016 at Unknown time  . nitroGLYCERIN (NITROSTAT) 0.4 MG SL tablet Place 1 tablet (0.4 mg total) under the tongue every 5 (five) minutes as needed for chest pain. 25 tablet 3 08/25/2016 at Unknown time  . pantoprazole (PROTONIX) 40 MG tablet Take 40 mg by mouth every morning.   08/25/2016 at Unknown time  . polyethylene glycol powder (GLYCOLAX/MIRALAX) powder Take 17 g by mouth daily.   08/25/2016 at Unknown time  . spironolactone (ALDACTONE) 25 MG tablet TAKE ONE TABLET BY MOUTH ONCE DAILY 30 tablet 6 08/25/2016 at Unknown time     ROS: As noted in HPI. All other systems are reviewed and are negative unless otherwise mentioned.   Physical Exam: Blood pressure 123/73, pulse 72, temperature 97.8  F (36.6 C), temperature source Oral, resp. rate 19, height 5\' 10"  (1.778 m), weight 240 lb (108.9 kg), SpO2 97 %. Current Weight  08/25/16 240 lb (108.9 kg)  05/17/16 238 lb (108 kg)  09/27/15 232 lb (105.2 kg)    GENERAL:  Well appearing overweight BM in NAD HEENT:  PERRL, EOMI, sclera are clear. Oropharynx is clear. NECK:  No jugular venous distention, carotid upstroke brisk and symmetric, no bruits, no thyromegaly or adenopathy LUNGS:  Clear to auscultation bilaterally  CHEST:  Unremarkable HEART:  RRR,  PMI not displaced or sustained,S1 and S2 within normal limits, no S3, no S4: no clicks, no rubs, no murmurs ABD:  Soft, nontender. BS +, no masses or bruits. No hepatomegaly, no splenomegaly EXT:  2 + pulses throughout, no edema, no cyanosis no clubbing SKIN:  Warm and dry.  No rashes NEURO:  Alert and oriented x 3. Cranial nerves II through XII intact. PSYCH:  Cognitively intact     Labs:   Lab Results  Component Value Date   WBC 6.1 08/25/2016   HGB 9.7 (L) 08/25/2016   HCT 31.8 (L) 08/25/2016   MCV 81.5 08/25/2016   PLT 312 08/25/2016    Recent Labs Lab 08/25/16 1740  NA 137  K 3.6  CL 101  CO2 29  BUN 17  CREATININE 1.28*  CALCIUM 8.9  GLUCOSE 248*                                        TROPONINI <0.03 08/26/2016   TROPONINI <0.03 08/26/2016   TROPONINI <0.03 08/25/2016   Radiology: Dg Chest 2 View  Result Date: 08/25/2016 CLINICAL DATA:  Central chest pain and headache, 4 days duration. EXAM: CHEST  2 VIEW COMPARISON:  12/11/2015 FINDINGS: Heart size is normal. There is aortic atherosclerosis. The lungs are clear. The vascularity is normal. No effusions. No significant bone finding. IMPRESSION: No active cardiopulmonary disease.  Aortic atherosclerosis. Electronically Signed   By: Paulina FusiMark  Shogry M.D.   On: 08/25/2016 18:10    EKG: NSR, LAD, LVH. I have personally reviewed and interpreted this study.   ASSESSMENT AND PLAN:  1. Atypical chest  pain. Symptoms persistent for 4 days. No evidence of infarct. Ecg actually improved from baseline. Minimal CAD by cath in 2012. He is diabetic so could have progression of CAD but based on exam and labs today I think his risk is low. I would recommend continuing his current medication and discharge to home. We can arrange an outpatient stress Myoview and follow up with Dr. Purvis SheffieldKoneswaran. 2. Thoracic aortic aneurysm. 4.2 cm. Recent CT in July 3. DM per primary team 4. HTN controlled. 5. Hyperlipidemia on statin.   Signed: Qusai Kem Sw[place], MDFACC  08/26/2016, 11:40 AM

## 2016-08-26 NOTE — Discharge Summary (Signed)
Physician Discharge Summary  Matthew Cain ZOX:[id number] DOB: April 19, 1956 DOA: 08/25/2016  PCP: MUSE,ROCHELLE D., PA-C  Admit date: 08/25/2016 Discharge date: 08/26/2016  Admitted From:  Home Disposition:  Home  Recommendations for Outpatient Follow-up:  1. Follow up with PCP in 1-2 weeks   Home Health: NO Equipment/Devices: N/A  Discharge Condition:  Stable CODE STATUS: FULL Diet recommendation: Heart Healthy   Brief/Interim Summary: 60 year old male with a history of CAD,NSTEMI (2011), diastolic CHF,, hypertension, CKD stage II, diabetes mellitus, hyperlipidemia, stroke presented with substernal chest pressure that has been intermittent for the past 4 days that radiated to his left shoulder. He states that this normally occurs at rest, but has been having increasing shortness of breath over the past 1-2 weeks. He describes the sensation as similar as that when he had his myocardial infarction in 2011. He denies any dizziness, nausea, vomiting, syncope. Because of increasing frequency of occurrence, the patient presented for further evaluation. Initial EKG reveals sinus rhythm with no concerning ST-T wave changes. Troponins are negative 3. Cardiology has been consulted assist with management. The patient states that he has not had a stress test since his myocardial infarction.  Pt was seen by cardiology who felt patient could be discharged with out pt followup for stress test.  Discharge Diagnoses:  Chest pain -Concerning for angina -Cardiology consulted -Troponins negative 3 -personally reviewed EKG--sinus, LAD, no ST-T changes -personally reviewed CXR--neg for edema or infiltrate -Continue aspirin, BB, atorvastatin -Hgb down 4 grams from 1 year ago--?contribution -appreciate cardiology consult--patient can be discharged without outpt followup for stress test  [place] anemia -Hgb down 4 grams from 07/14/15 -check iron, B12, folate--pending at time of d/c -FOBT -pt denies  hematochezia or melena  Coronary artery disease -Still having intermittent chest discomfort -Continue aspirin, BB, atorvastatin, imdur -EKG--sinus, LAD, no ST-T changes  Hypertension -Continue amlodipine, acebutolol, lisinopril  Chronic diastolic CHF -11/30/2011 echo EF 55-60 percent, grade 1 DD, -Continue home dose furosemide, spironolactone, Imdur, BB -daily weights  Diabetes mellitus type 2 -Check hemoglobin A1c--pending at time of dc -Continue Lantus -NovoLog sliding scale  CKD stage II -Baseline creatinine 1.2-1.4  Thoracic aortic aneurysm -05/24/16 CTA chest--4.2 cm without dissection  Anxiety, NOS -continue buspar and celexa    Discharge Instructions  Discharge Instructions    Diet - low sodium heart healthy    Complete by:  As directed    Increase activity slowly    Complete by:  As directed        Medication List    TAKE these medications   acebutolol 200 MG capsule Commonly known as:  SECTRAL TAKE ONE CAPSULE BY MOUTH TWICE DAILY   amLODipine 10 MG tablet Commonly known as:  NORVASC Take 1 tablet (10 mg total) by mouth daily. What changed:  when to take this   aspirin 81 MG EC tablet Take 81 mg by mouth daily.   atorvastatin 40 MG tablet Commonly known as:  LIPITOR Take 1 tablet (40 mg total) by mouth every evening.   busPIRone 10 MG tablet Commonly known as:  BUSPAR Take 10 mg by mouth 2 (two) times daily.   citalopram 20 MG tablet Commonly known as:  CELEXA Take 20 mg by mouth every morning.   furosemide 40 MG tablet Commonly known as:  LASIX TAKE ONE TABLET BY MOUTH TWICE DAILY   ibuprofen 800 MG tablet Commonly known as:  ADVIL,MOTRIN Take 400-800 mg by mouth every 8 (eight) hours as needed for pain.   IRON SUPPLEMENT 325 (65  FE) MG tablet Generic drug:  ferrous sulfate Take 325 mg by mouth at bedtime.   isosorbide mononitrate 30 MG 24 hr tablet Commonly known as:  IMDUR TAKE ONE TABLET BY MOUTH ONCE DAILY     LANTUS SOLOSTAR 100 UNIT/ML Solostar Pen Generic drug:  Insulin Glargine Inject 30 Units into the skin daily at 10 pm.   lisinopril 20 MG tablet Commonly known as:  PRINIVIL,ZESTRIL Take 1 tablet (20 mg total) by mouth daily.   metFORMIN 1000 MG tablet Commonly known as:  GLUCOPHAGE Take 1,000 mg by mouth 2 (two) times daily.   nitroGLYCERIN 0.4 MG SL tablet Commonly known as:  NITROSTAT Place 1 tablet (0.4 mg total) under the tongue every 5 (five) minutes as needed for chest pain.   pantoprazole 40 MG tablet Commonly known as:  PROTONIX Take 40 mg by mouth every morning.   polyethylene glycol powder powder Commonly known as:  GLYCOLAX/MIRALAX Take 17 g by mouth daily.   spironolactone 25 MG tablet Commonly known as:  ALDACTONE TAKE ONE TABLET BY MOUTH ONCE DAILY       No Known Allergies  Consultations: Cardiology   Procedures/Studies: Dg Chest 2 View  Result Date: 08/25/2016 CLINICAL DATA:  Central chest pain and headache, 4 days duration. EXAM: CHEST  2 VIEW COMPARISON:  12/11/2015 FINDINGS: Heart size is normal. There is aortic atherosclerosis. The lungs are clear. The vascularity is normal. No effusions. No significant bone finding. IMPRESSION: No active cardiopulmonary disease.  Aortic atherosclerosis. Electronically Signed   By: Paulina Fusi M.D.   On: 08/25/2016 18:10        Discharge Exam: Vitals:   08/26/16 0446 08/26/16 0947  BP: (!) 126/58 123/73  Pulse: 60 72  Resp: 19   Temp: 97.8 F (36.6 C)    Vitals:   08/25/16 2030 08/25/16 2201 08/26/16 0446 08/26/16 0947  BP: 138/74 (!) 172/86 (!) 126/58 123/73  Pulse: 65 62 60 72  Resp: 19 19 19    Temp:  98 F (36.7 C) 97.8 F (36.6 C)   TempSrc:  Oral Oral   SpO2: 99% 98% 97%   Weight:  108.9 kg (240 lb)    Height:  5\' 10"  (1.778 m)      General: Pt is alert, awake, not in acute distress Cardiovascular: RRR, S1/S2 +, no rubs, no gallops Respiratory: CTA bilaterally, no wheezing, no  rhonchi Abdominal: Soft, NT, ND, bowel sounds + Extremities: no edema, no cyanosis   The results of significant diagnostics from this hospitalization (including imaging, microbiology, ancillary and laboratory) are listed below for reference.    Significant Diagnostic Studies: Dg Chest 2 View  Result Date: 08/25/2016 CLINICAL DATA:  Central chest pain and headache, 4 days duration. EXAM: CHEST  2 VIEW COMPARISON:  12/11/2015 FINDINGS: Heart size is normal. There is aortic atherosclerosis. The lungs are clear. The vascularity is normal. No effusions. No significant bone finding. IMPRESSION: No active cardiopulmonary disease.  Aortic atherosclerosis. Electronically Signed   By: Paulina Fusi M.D.   On: 08/25/2016 18:10     Microbiology: No results found for this or any previous visit (from the past 240 hour(s)).   Labs: Basic Metabolic Panel:  Recent Labs Lab 08/25/16 1740  NA 137  K 3.6  CL 101  CO2 29  GLUCOSE 248*  BUN 17  CREATININE 1.28*  CALCIUM 8.9   Liver Function Tests: No results for input(s): AST, ALT, ALKPHOS, BILITOT, PROT, ALBUMIN in the last 168 hours. No results for input(s):  LIPASE, AMYLASE in the last 168 hours. No results for input(s): AMMONIA in the last 168 hours. CBC:  Recent Labs Lab 08/25/16 1740  WBC 6.1  HGB 9.7*  HCT 31.8*  MCV 81.5  PLT 312   Cardiac Enzymes:  Recent Labs Lab 08/25/16 2240 08/26/16 0213 08/26/16 0409  TROPONINI <0.03 <0.03 <0.03   BNP: Invalid input(s): POCBNP CBG:  Recent Labs Lab 08/25/16 2205 08/26/16 0625 08/26/16 0655 08/26/16 1141  GLUCAP 203* 204* 201* 261*    Time coordinating discharge:  Greater than 30 minutes  Signed:  Breezy Hertenstein, DO [facility] Pager: [phone] 08/26/2016, 12:17 PM

## 2016-08-26 NOTE — Progress Notes (Addendum)
PROGRESS NOTE  Matthew Cain ZOX:[id number] DOB: [birth year] DOA: 08/25/2016 PCP: Kizzie Furnish D., PA-C  Brief History:  60 year old male with a history of CAD,NSTEMI (2011), diastolic CHF,, hypertension, CKD stage II, diabetes mellitus, hyperlipidemia, stroke presented with substernal chest pressure that has been intermittent for the past 4 days that radiated to his left shoulder. He states that this normally occurs at rest, but has been having increasing shortness of breath over the past 1-2 weeks. He describes the sensation as similar as that when he had his myocardial infarction in 2011. He denies any dizziness, nausea, vomiting, syncope. Because of increasing frequency of occurrence, the patient presented for further evaluation. Initial EKG reveals sinus rhythm with no concerning ST-T wave changes. Troponins are negative 3. Cardiology has been consulted assist with management. The patient states that he has not had a stress test since his myocardial infarction.  Assessment/Plan: Chest pain -Concerning for angina -Cardiology consulted -Troponins negative 3 -personally reviewed EKG--sinus, LAD, no ST-T changes -personally reviewed CXR--neg for edema or infiltrate -Continue aspirin, BB, atorvastatin -Hgb down 4 grams from 1 year ago--?contribution  [place] anemia -Hgb down 4 grams from 07/14/15 -check iron, B12, folate -FOBT -pt denies hematochezia or melena  Coronary artery disease -Still having intermittent chest discomfort -Continue aspirin, BB, atorvastatin, imdur -EKG--sinus, LAD, no ST-T changes  Hypertension -Continue amlodipine, acebutolol, lisinopril  Chronic diastolic CHF -11/30/2011 echo EF 55-60 percent, grade 1 DD, -Continue home dose furosemide, spironolactone, Imdur, BB -daily weights  Diabetes mellitus type 2 -Check hemoglobin A1c -Continue Lantus -NovoLog sliding scale  CKD stage II -Baseline creatinine 1.2-1.4  Thoracic aortic aneurysm -05/24/16  CTA chest--4.2 cm without dissection  Anxiety, NOS -continue buspar and celexa    Disposition Plan:   Home when cleared by cardiology Family Communication:   No Family at bedside--Total time spent 35 minutes.  Greater than 50% spent face to face counseling and coordinating care.   Consultants:  cardiology  Code Status:  FULL  DVT Prophylaxis:   [place] Lovenox   Procedures: As Listed in Progress Note Above  Antibiotics: None    Subjective: Patient denies fevers, chills, headache, chest pain, dyspnea, nausea, vomiting, diarrhea, abdominal pain, dysuria, hematuria, hematochezia, and melena.   Objective: Vitals:   08/25/16 2000 08/25/16 2030 08/25/16 2201 08/26/16 0446  BP: 152/77 138/74 (!) 172/86 (!) 126/58  Pulse: 63 65 62 60  Resp: 21 19 19 19   Temp:   98 F (36.7 C) 97.8 F (36.6 C)  TempSrc:   Oral Oral  SpO2: 96% 99% 98% 97%  Weight:   108.9 kg (240 lb)   Height:   5\' 10"  (1.778 m)    No intake or output data in the 24 hours ending 08/26/16 0904 Weight change:  Exam:   General:  Pt is alert, follows commands appropriately, not in acute distress  HEENT: No icterus, No thrush, No neck mass, [place]/AT  Cardiovascular: RRR, S1/S2, no rubs, no gallops  Respiratory: CTA bilaterally, no wheezing, no crackles, no rhonchi  Abdomen: Soft/+BS, non tender, non distended, no guarding  Extremities: No edema, No lymphangitis, No petechiae, No rashes, no synovitis   Data Reviewed: I have personally reviewed following labs and imaging studies Basic Metabolic Panel:  Recent Labs Lab 08/25/16 1740  NA 137  K 3.6  CL 101  CO2 29  GLUCOSE 248*  BUN 17  CREATININE 1.28*  CALCIUM 8.9   Liver Function Tests: No results for input(s): AST, ALT,  ALKPHOS, BILITOT, PROT, ALBUMIN in the last 168 hours. No results for input(s): LIPASE, AMYLASE in the last 168 hours. No results for input(s): AMMONIA in the last 168 hours. Coagulation Profile: No results for input(s):  INR, PROTIME in the last 168 hours. CBC:  Recent Labs Lab 08/25/16 1740  WBC 6.1  HGB 9.7*  HCT 31.8*  MCV 81.5  PLT 312   Cardiac Enzymes:  Recent Labs Lab 08/25/16 2240 08/26/16 0213 08/26/16 0409  TROPONINI <0.03 <0.03 <0.03   BNP: Invalid input(s): POCBNP CBG:  Recent Labs Lab 08/25/16 2205 08/26/16 0625 08/26/16 0655  GLUCAP 203* 204* 201*   HbA1C: No results for input(s): HGBA1C in the last 72 hours. Urine analysis:    Component Value Date/Time   COLORURINE YELLOW 09/10/2011 1343   APPEARANCEUR CLEAR 09/10/2011 1343   LABSPEC 1.008 09/10/2011 1343   PHURINE 7.0 09/10/2011 1343   GLUCOSEU NEGATIVE 09/10/2011 1343   HGBUR TRACE (A) 09/10/2011 1343   BILIRUBINUR NEGATIVE 09/10/2011 1343   KETONESUR NEGATIVE 09/10/2011 1343   PROTEINUR NEGATIVE 09/10/2011 1343   UROBILINOGEN 0.2 09/10/2011 1343   NITRITE NEGATIVE 09/10/2011 1343   LEUKOCYTESUR NEGATIVE 09/10/2011 1343   Sepsis Labs: @LABRCNTIP (procalcitonin:4,lacticidven:4) )No results found for this or any previous visit (from the past 240 hour(s)).   Scheduled Meds: . acebutolol  200 mg Oral BID  . amLODipine  10 mg Oral Daily  . aspirin EC  81 mg Oral Daily  . atorvastatin  40 mg Oral QPM  . busPIRone  10 mg Oral BID  . citalopram  20 mg Oral Daily  . enoxaparin (LOVENOX) injection  40 mg Subcutaneous Q24H  . furosemide  40 mg Oral BID  . insulin aspart  0-15 Units Subcutaneous TID WC  . insulin aspart  0-5 Units Subcutaneous QHS  . insulin glargine  30 Units Subcutaneous Q2200  . isosorbide mononitrate  30 mg Oral Daily  . lisinopril  20 mg Oral Daily  . nitroGLYCERIN  1 inch Topical Q8H  . pantoprazole  40 mg Oral Daily  . spironolactone  25 mg Oral Daily   Continuous Infusions:   Procedures/Studies: Dg Chest 2 View  Result Date: 08/25/2016 CLINICAL DATA:  Central chest pain and headache, 4 days duration. EXAM: CHEST  2 VIEW COMPARISON:  12/11/2015 FINDINGS: Heart size is normal.  There is aortic atherosclerosis. The lungs are clear. The vascularity is normal. No effusions. No significant bone finding. IMPRESSION: No active cardiopulmonary disease.  Aortic atherosclerosis. Electronically Signed   By: Paulina FusiMark  Shogry M.D.   On: 08/25/2016 18:10    Jaqwon Manfred, DO  [facility] Pager [phone]  If 7PM-7AM, please contact night-coverage [email or web address] Password [email or web address] 08/26/2016, 9:04 AM   LOS: 0 days

## 2016-08-27 LAB — HEMOGLOBIN A1C
Hgb A1c MFr Bld: 8.9 % — ABNORMAL HIGH (ref 4.8–5.6)
Mean Plasma Glucose: 209 mg/dL

## 2016-08-28 LAB — FOLATE RBC
Folate, Hemolysate: 437.5 ng/mL
Folate, RBC: 1402 ng/mL (ref >498)
Hematocrit: 31.2 % — ABNORMAL LOW (ref 37.5–51.0)

## 2016-09-12 ENCOUNTER — Encounter: Payer: Self-pay | Admitting: Cardiovascular Disease

## 2016-09-12 ENCOUNTER — Telehealth: Payer: Self-pay | Admitting: Cardiovascular Disease

## 2016-09-12 NOTE — Telephone Encounter (Signed)
-----   Message from Sherrilyn RistGesila C Davis sent at 09/06/2016  5:37 PM EDT -----   ----- Message ----- From: Arty BaumgartnerLindsay B Roberts, NP Sent: 08/26/2016  11:55 AM To: [facility] Scheduling  Please schedule this patient for a follow-up appointment and call them with that information.  Primary Cardiologist: Dr. Purvis SheffieldKoneswaran Date of Discharge: 08/26/2016 Appointment Needed Within: after having stress test completed.  Appointment Type: Hospital follow up   ## Patient will also need to be arranged for an outpatient stress test. Orders have been placed.   Thank you! Laverda PageLindsay Roberts, NP-C

## 2016-09-15 ENCOUNTER — Telehealth: Payer: Self-pay

## 2016-09-15 DIAGNOSIS — R072 Precordial pain: Secondary | ICD-10-CM

## 2016-09-15 NOTE — Telephone Encounter (Signed)
Per Dr Elvis CoilJordan's consultation note,pt needs exercise Myoview.Order placed, I spoke with pt,he can walk on treadmill.He is diabetic.I gave him instructions to HOLD Metformin and Imdur the am of test.He takes insulin at 7 pm the evening before.I asked him to have a snack before bed and be NPO after midnight, he could take all his other medications that am.I explained the procedure would last 3-4 hrs. Patient verbalized understanding.

## 2016-09-19 ENCOUNTER — Encounter (HOSPITAL_COMMUNITY)
Admission: RE | Admit: 2016-09-19 | Discharge: 2016-09-19 | Disposition: A | Discharge status: Home / Self Care | Payer: Medicaid Other | Source: Ambulatory Visit | Attending: Cardiology | Admitting: Cardiology

## 2016-09-19 ENCOUNTER — Inpatient Hospital Stay (HOSPITAL_COMMUNITY): Admission: RE | Admit: 2016-09-19 | Payer: Medicaid Other | Source: Ambulatory Visit

## 2016-09-19 ENCOUNTER — Encounter (HOSPITAL_COMMUNITY): Payer: Self-pay

## 2016-09-19 DIAGNOSIS — R072 Precordial pain: Secondary | ICD-10-CM | POA: Insufficient documentation

## 2016-09-19 HISTORY — DX: Disorder of kidney and ureter, unspecified: N28.9

## 2016-09-19 HISTORY — DX: Heart failure, unspecified: I50.9

## 2016-09-19 LAB — NM MYOCAR MULTI W/SPECT W/WALL MOTION / EF
LV dias vol: 110 mL (ref 62–150)
LV sys vol: 33 mL
Peak HR: 82 {beats}/min
RATE: 0.21
Rest HR: 61 {beats}/min
SDS: 3
SRS: 1
SSS: 4
TID: 1.25

## 2016-09-19 MED ORDER — SODIUM CHLORIDE 0.9% FLUSH
INTRAVENOUS | Status: AC
  Administered 2016-09-19: 10 mL via INTRAVENOUS
  Filled 2016-09-19: qty 10

## 2016-09-19 MED ORDER — REGADENOSON 0.4 MG/5ML IV SOLN
INTRAVENOUS | Status: AC
  Administered 2016-09-19: 0.4 mg via INTRAVENOUS
  Filled 2016-09-19: qty 5

## 2016-09-19 MED ORDER — TECHNETIUM TC 99M TETROFOSMIN IV KIT
10.0000 | PACK | Freq: Once | INTRAVENOUS | Status: AC | PRN
  Administered 2016-09-19: 10.1 via INTRAVENOUS

## 2016-09-19 MED ORDER — TECHNETIUM TC 99M TETROFOSMIN IV KIT
30.0000 | PACK | Freq: Once | INTRAVENOUS | Status: AC | PRN
  Administered 2016-09-19: 31 via INTRAVENOUS

## 2016-09-25 ENCOUNTER — Encounter: Payer: Self-pay | Admitting: Adult Health

## 2016-09-25 ENCOUNTER — Other Ambulatory Visit (HOSPITAL_COMMUNITY)
Admission: RE | Admit: 2016-09-25 | Discharge: 2016-09-25 | Disposition: A | Discharge status: Home / Self Care | Payer: Medicaid Other | Source: Ambulatory Visit | Attending: Adult Health | Admitting: Adult Health

## 2016-09-25 ENCOUNTER — Ambulatory Visit (INDEPENDENT_AMBULATORY_CARE_PROVIDER_SITE_OTHER): Payer: Medicaid Other | Admitting: Adult Health

## 2016-09-25 VITALS — BP 122/76 | HR 69 | Ht 71.0 in | Wt 237.0 lb

## 2016-09-25 DIAGNOSIS — D508 Other iron deficiency anemias: Secondary | ICD-10-CM

## 2016-09-25 DIAGNOSIS — N179 Acute kidney failure, unspecified: Secondary | ICD-10-CM

## 2016-09-25 LAB — CBC WITH DIFFERENTIAL/PLATELET
Basophils Absolute: 0.1 10*3/uL (ref 0.0–0.1)
Basophils Relative: 1 %
Eosinophils Absolute: 0.1 10*3/uL (ref 0.0–0.7)
Eosinophils Relative: 2 %
HCT: 32.7 % — ABNORMAL LOW (ref 39.0–52.0)
Hemoglobin: 9.9 g/dL — ABNORMAL LOW (ref 13.0–17.0)
Lymphocytes Relative: 39 %
Lymphs Abs: 2.9 10*3/uL (ref 0.7–4.0)
MCH: 23.7 pg — ABNORMAL LOW (ref 26.0–34.0)
MCHC: 30.3 g/dL (ref 30.0–36.0)
MCV: 78.4 fL (ref 78.0–100.0)
Monocytes Absolute: 0.9 10*3/uL (ref 0.1–1.0)
Monocytes Relative: 12 %
Neutro Abs: 3.4 10*3/uL (ref 1.7–7.7)
Neutrophils Relative %: 46 %
Platelets: 436 10*3/uL — ABNORMAL HIGH (ref 150–400)
RBC: 4.17 MIL/uL — ABNORMAL LOW (ref 4.22–5.81)
RDW: 15.7 % — ABNORMAL HIGH (ref 11.5–15.5)
WBC: 7.4 10*3/uL (ref 4.0–10.5)

## 2016-09-25 LAB — BASIC METABOLIC PANEL
Anion gap: 8 (ref 5–15)
BUN: 13 mg/dL (ref 6–20)
CO2: 27 mmol/L (ref 22–32)
Calcium: 9 mg/dL (ref 8.9–10.3)
Chloride: 101 mmol/L (ref 101–111)
Creatinine, Ser: 1.62 mg/dL — ABNORMAL HIGH (ref 0.61–1.24)
GFR calc Af Amer: 52 mL/min — ABNORMAL LOW (ref >60)
GFR calc non Af Amer: 45 mL/min — ABNORMAL LOW (ref >60)
Glucose, Bld: 184 mg/dL — ABNORMAL HIGH (ref 65–99)
Potassium: 4.1 mmol/L (ref 3.5–5.1)
Sodium: 136 mmol/L (ref 135–145)

## 2016-09-25 MED ORDER — NITROGLYCERIN 0.4 MG SL SUBL: 0.4000 mg | SUBLINGUAL_TABLET | SUBLINGUAL | 3 refills | Status: DC | PRN

## 2016-09-25 NOTE — Progress Notes (Signed)
Name: Matthew Cain    DOB: [birth year]  Age: 60 y.o.  MR#: [id number]       PCP:  Tylene Fantasia., PA-C      Insurance: Payor: MEDICAID [place] / Plan: MEDICAID [place] ACCESS / Product Type: *No Product type* /   CC:   No chief complaint on file.   VS Vitals:   09/25/16 1441  BP: 122/76  Pulse: 69  SpO2: 95%  Weight: 237 lb (107.5 kg)  Height: 5\' 11"  (1.803 m)    Weights Current Weight  09/25/16 237 lb (107.5 kg)  08/25/16 240 lb (108.9 kg)  05/17/16 238 lb (108 kg)    Blood Pressure  BP Readings from Last 3 Encounters:  09/25/16 122/76  08/26/16 123/73  05/17/16 128/78     Admit date:  (Not on file) Last encounter with RMR:  Visit date not found   Allergy Patient has no known allergies.  Current Outpatient Prescriptions  Medication Sig Dispense Refill  . acebutolol (SECTRAL) 200 MG capsule TAKE ONE CAPSULE BY MOUTH TWICE DAILY 60 capsule 11  . amLODipine (NORVASC) 10 MG tablet Take 1 tablet (10 mg total) by mouth daily. (Patient taking differently: Take 10 mg by mouth every morning. ) 30 tablet 6  . aspirin 81 MG EC tablet Take 81 mg by mouth daily.    [name] atorvastatin (LIPITOR) 40 MG tablet Take 1 tablet (40 mg total) by mouth every evening. 30 tablet 6  . busPIRone (BUSPAR) 10 MG tablet Take 10 mg by mouth 2 (two) times daily.    . citalopram (CELEXA) 20 MG tablet Take 20 mg by mouth every morning.     . ferrous sulfate (IRON SUPPLEMENT) 325 (65 FE) MG tablet Take 325 mg by mouth at bedtime.     . furosemide (LASIX) 40 MG tablet TAKE ONE TABLET BY MOUTH TWICE DAILY 60 tablet 6  . ibuprofen (ADVIL,MOTRIN) 800 MG tablet Take 400-800 mg by mouth every 8 (eight) hours as needed for pain.     . Insulin Glargine (LANTUS SOLOSTAR) 100 UNIT/ML Solostar Pen Inject 30 Units into the skin daily at 10 pm.    . isosorbide mononitrate (IMDUR) 30 MG 24 hr tablet TAKE ONE TABLET BY MOUTH ONCE DAILY 30 tablet 6  . lisinopril (PRINIVIL,ZESTRIL) 20 MG tablet Take 1 tablet (20 mg total) by  mouth daily. 30 tablet 6  . metFORMIN (GLUCOPHAGE) 1000 MG tablet Take 1,000 mg by mouth 2 (two) times daily.    . nitroGLYCERIN (NITROSTAT) 0.4 MG SL tablet Place 1 tablet (0.4 mg total) under the tongue every 5 (five) minutes as needed for chest pain. 25 tablet 3  . pantoprazole (PROTONIX) 40 MG tablet Take 40 mg by mouth every morning.    . polyethylene glycol powder (GLYCOLAX/MIRALAX) powder Take 17 g by mouth daily.    [name] spironolactone (ALDACTONE) 25 MG tablet TAKE ONE TABLET BY MOUTH ONCE DAILY 30 tablet 6   No current facility-administered medications for this visit.     Discontinued Meds:    Medications Discontinued During This Encounter  Medication Reason  . nitroGLYCERIN (NITROSTAT) 0.4 MG SL tablet Reorder    Patient Active Problem List   Diagnosis Date Noted  . CKD stage 2 due to type 2 diabetes mellitus (HCC) 08/26/2016  . Chest pain 08/25/2016  . Hydrocele, left 07/21/2015  . 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 Palpitations 03/13/2012  . Panic disorder 02/13/2012  . Thoracic aortic aneurysm (HCC) 11/23/2011  . Chronic  diastolic heart failure (HCC)   . CVA (cerebral infarction) 09/17/2011  . CAD (coronary artery disease), native coronary artery 09/17/2011  . HTN (hypertension) 09/17/2011  . Dyslipidemia 09/17/2011  . DM (diabetes mellitus) (HCC) 09/17/2011  . Renal insufficiency 09/17/2011  . Pneumonia 09/17/2011  . Myocardial infarction 07/15/2011    LABS    Component Value Date/Time   NA 137 08/25/2016 1740   NA 139 07/14/2015 1035   NA 137 09/17/2011 1208   K 3.6 08/25/2016 1740   K 4.2 07/14/2015 1035   K 4.0 09/17/2011 1208   CL 101 08/25/2016 1740   CL 102 07/14/2015 1035   CL 101 09/17/2011 1208   CO2 29 08/25/2016 1740   CO2 26 07/14/2015 1035   CO2 26 09/17/2011 1208   GLUCOSE 248 (H) 08/25/2016 1740   GLUCOSE 145 (H) 07/14/2015 1035   GLUCOSE 97 09/17/2011 1208   BUN 17 08/25/2016 1740   BUN 22 (H) 07/14/2015  1035   BUN 16 09/17/2011 1208   CREATININE 1.28 (H) 08/25/2016 1740   CREATININE 1.40 (H) 05/24/2016 0917   CREATININE 1.46 (H) 07/14/2015 1035   CALCIUM 8.9 08/25/2016 1740   CALCIUM 9.3 07/14/2015 1035   CALCIUM 9.4 09/17/2011 1208   GFRNONAA 59 (L) 08/25/2016 1740   GFRNONAA 51 (L) 07/14/2015 1035   GFRNONAA 55 (L) 09/17/2011 1208   GFRAA >60 08/25/2016 1740   GFRAA 59 (L) 07/14/2015 1035   GFRAA 63 (L) 09/17/2011 1208   CMP     Component Value Date/Time   NA 137 08/25/2016 1740   K 3.6 08/25/2016 1740   CL 101 08/25/2016 1740   CO2 29 08/25/2016 1740   GLUCOSE 248 (H) 08/25/2016 1740   BUN 17 08/25/2016 1740   CREATININE 1.28 (H) 08/25/2016 1740   CALCIUM 8.9 08/25/2016 1740   GFRNONAA 59 (L) 08/25/2016 1740   GFRAA >60 08/25/2016 1740       Component Value Date/Time   WBC 6.1 08/25/2016 1740   WBC 8.3 07/14/2015 1035   WBC 5.6 09/14/2011 0720   HGB 9.7 (L) 08/25/2016 1740   HGB 13.6 07/14/2015 1035   HGB 12.2 (L) 09/14/2011 0720   HCT 31.2 (L) 08/26/2016 1030   HCT 31.8 (L) 08/25/2016 1740   HCT 41.5 07/14/2015 1035   HCT 37.7 (L) 09/14/2011 0720   MCV 81.5 08/25/2016 1740   MCV 89.1 07/14/2015 1035   MCV 89.8 09/14/2011 0720    Lipid Panel     Component Value Date/Time   CHOL 231 (H) 09/12/2011 1711   TRIG 276 (H) 09/12/2011 1711   HDL 47 09/12/2011 1711   CHOLHDL 4.9 09/12/2011 1711   VLDL 55 (H) 09/12/2011 1711   LDLCALC 129 (H) 09/12/2011 1711    ABG    Component Value Date/Time   PHART 7.471 (H) 09/09/2011 0558   PCO2ART 40.8 09/09/2011 0558   PO2ART 68.5 (L) 09/09/2011 0558   HCO3 29.4 (H) 09/09/2011 0558   TCO2 30.6 09/09/2011 0558   O2SAT 95.0 09/09/2011 0558     Lab Results  Component Value Date   TSH 1.117 09/09/2011   BNP (last 3 results) No results for input(s): BNP in the last 8760 hours.  ProBNP (last 3 results) No results for input(s): PROBNP in the last 8760 hours.  Cardiac Panel (last 3 results) No results for  input(s): CKTOTAL, CKMB, TROPONINI, RELINDX in the last 72 hours.  Iron/TIBC/Ferritin/ %Sat    Component Value Date/Time   IRON 20 (L) 08/26/2016  1030   TIBC 396 08/26/2016 1030   FERRITIN 7 (L) 08/26/2016 1030   IRONPCTSAT 5 (L) 08/26/2016 1030     EKG Orders placed or performed during the hospital encounter of 08/25/16  . EKG 12-Lead  . EKG 12-Lead  . EKG     Prior Assessment and Plan Problem List as of 09/25/2016 Reviewed: 05/17/2016  8:16 AM by Prentice DockerSuresh Koneswaran, MD     Cardiovascular and Mediastinum   CAD (coronary artery disease), native coronary artery   Last Assessment & Plan 09/18/2012 Office Visit Written 09/18/2012  1:41 PM by Rande BruntEugene C Serpe, PA    Continue aggressive medical management. Will renew prescription for NTG. No current indication for ischemic evaluation. Patient had nonobstructive CAD by cardiac catheterization, 08/2011. Reassess clinical status in 6 months, at which time he will establish with Dr. Diona BrownerMcDowell. He indicates today that he prefers to remain with our practice here in [place].      HTN (hypertension)   Last Assessment & Plan 09/18/2012 Office Visit Written 09/18/2012  1:43 PM by Rande BruntEugene C Serpe, PA    Well-controlled on current medication regimen.      Chronic diastolic heart failure [facility](HCC)   Last Assessment & Plan 09/18/2012 Office Visit Written 09/18/2012  1:42 PM by Rande BruntEugene C Serpe, PA    Euvolemic by history and exam. Continue current diuretic regimen, followed by his primary team [facility].      Thoracic aortic aneurysm [facility](HCC)   Last Assessment & Plan 11/23/2011 Office Visit Written 11/23/2011 10:58 AM by Rande BruntEugene C Serpe, PA    4.4 cm, by CT scan, 10/12, with no evidence of dissection. Will need continued close monitoring.      Myocardial infarction     Respiratory   Pneumonia     Endocrine   DM (diabetes mellitus) [facility](HCC)   Last Assessment & Plan 09/18/2012 Office Visit Written 09/18/2012  1:43 PM by Rande BruntEugene C Serpe, PA    Followed by  primary team. Patient is on a statin.      CKD stage 2 due to type 2 diabetes mellitus (HCC)     Nervous and Auditory   CVA (cerebral infarction)   Last Assessment & Plan 11/23/2011 Office Visit Written 11/23/2011 10:51 AM by Rande BruntEugene C Serpe, PA    The patient has some residual memory deficit, but otherwise a near full recovery. Patient remains on low-dose aspirin.        Genitourinary   Renal insufficiency   Last Assessment & Plan 11/23/2011 Office Visit Written 11/23/2011 10:55 AM by Rande BruntEugene C Serpe, PA    We'll order followup labs, with recommended close monitoring of renal function, particularly given that patient remains on Aldactone.      BPH with obstruction/lower urinary tract symptoms   Hydrocele, left     Other   Dyslipidemia   Last Assessment & Plan 11/23/2011 Office Visit Edited 11/23/2011 10:52 AM by Rande BruntEugene C Serpe, PA    Will reassess lipid status with a FLP. Otherwise continue current dose of simvastatin.      Panic disorder   Last Assessment & Plan 09/18/2012 Office Visit Written 09/18/2012  1:45 PM by Rande BruntEugene C Serpe, PA    [facility], who is managing his Klonopin medication. Of note, however, he has requested a refill of his trazodone prescription, which was initiated by Dr. Andee LinemaneGent, and which was very effective when needed to help him sleep. Will provide a short supply of this, but  with subsequent monitoring and management to be deferred to [facility].      Palpitations   Last Assessment & Plan 09/18/2012 Office Visit Written 09/18/2012  1:42 PM by Rande BruntEugene C Serpe, PA    Quiescent on current medication regimen, which includes acebutolol.      Chest pain       Imaging: Nm Myocar Multi W/spect W/[place] [employer] / Ef  Result Date: 09/19/2016  There was no ST segment deviation noted during stress.  T wave inversion was noted during stress in the III, aVF and V6 leads.  The study is normal.  This is a low  risk study.  The left ventricular ejection fraction is hyperdynamic (>65%).  Blood pressure demonstrated a normal response to exercise.  Diaphragmatic attenuation no ischemia or infarction EF 71%

## 2016-09-25 NOTE — Progress Notes (Signed)
Cardiology Office Note   Date:  09/25/2016   ID:  Matthew PootLacy Brockmann, DOB Mar 15, 1956, MRN [id number]  PCP:  Tylene FantasiaMUSE,ROCHELLE D., PA-C  Cardiologist: Inis SizerKoneswaran/  Breckin Zafar, NP   Chief Complaint  Patient presents with  . Coronary Artery Disease  . Congestive Heart Failure      History of Present Illness: Matthew Cain is a 60 y.o. male who presents for ongoing assessment and management of nonobstructive coronary artery disease, thoracic aortic aneurysm, chronic diastolic heart failure, hypertension, and complaints of palpitations. He was last seen by  Dr. Purvis SheffieldKoneswaran in July 2017. He was clinically stable from a cardiac standpoint. Most recent CT scan of his chest for thoracic aortic aneurysm revealed 4.2 cm in July 2017.  He is without complaint today. He denies chest pain palpitations irregular heart rate or dizziness. He has noticed however, that when he urinates there is some blood in his urine, sometimes "it's black", and also when he wipes himself after a bowel movement he is noticing blood on the toilet paper.  Past Medical History:  Diagnosis Date  . Amputation finger    left ring finger  . Anxiety   . CAD (coronary artery disease)    LHC 10/12:  mild plaque in the LAD but no obstructive CAD  . CHF (congestive heart failure) (HCC)   . Chronic diastolic heart failure (HCC)    EF 55-60%, grade 1 diastolic dysfunction; no WMAs, echo, 11/2011; s/p pulmo. edema c/b VDRF 11/12; Echo 09/08/11: severe LVH, no SAM, no LVOT gradient, EF 55%, mild BAE, mild reduced RVSF.  [name]. CKD (chronic kidney disease)   . Constipation    "due to medication"  . Depression   . Diabetes mellitus    A1C 7.3 08/2011  . GERD (gastroesophageal reflux disease)   . History of kidney stones   . History of right ACA stroke    10/12  --  MRI 09/13/11:  Acute right ACA infarct involving corpus callosum.  MRA 09/13/11: severe intracranial ASD, right ACA occluded, left PCA and right MCA with severe disease.  Dopplers were  neg for significant ICA stenosis  . History of transfusion   . HLD (hyperlipidemia)   . Hypertension   . Myocardial infarction 07/2011  . Renal insufficiency   . Shortness of breath dyspnea    with exertion  . Spermatocele   . Thoracic aortic aneurysm (HCC)    CT in 10/12: 4.4 cm ascending thoracic aortic aneurysm  . Tongue tied    "at times I get tongue tied since the stroke"    Past Surgical History:  Procedure Laterality Date  .  vasectomy     bilateral  . CARDIAC CATHETERIZATION  09/09/11  . ROTATOR CUFF REPAIR  07/2015   LEFT  . SPERMATOCELECTOMY  1990's   RT side  . SPERMATOCELECTOMY Left 07/20/2015   Procedure: LEFT HYDROCELECTOMY;  Surgeon: Bjorn PippinJohn Wrenn, MD;  Location: [facility];  Service: Urology;  Laterality: Left;  . TRANSURETHRAL INCISION OF PROSTATE N/A 07/20/2015   Procedure: TRANSURETHRAL INCISION OF THE PROSTATE (TUIP);  Surgeon: Bjorn PippinJohn Wrenn, MD;  Location: [facility];  Service: Urology;  Laterality: N/A;     Current Outpatient Prescriptions  Medication Sig Dispense Refill  . acebutolol (SECTRAL) 200 MG capsule TAKE ONE CAPSULE BY MOUTH TWICE DAILY 60 capsule 11  . amLODipine (NORVASC) 10 MG tablet Take 1 tablet (10 mg total) by mouth daily. (Patient taking differently: Take 10 mg by mouth every morning. ) 30 tablet 6  . aspirin  81 MG EC tablet Take 81 mg by mouth daily.    [name]. atorvastatin (LIPITOR) 40 MG tablet Take 1 tablet (40 mg total) by mouth every evening. 30 tablet 6  . busPIRone (BUSPAR) 10 MG tablet Take 10 mg by mouth 2 (two) times daily.    . citalopram (CELEXA) 20 MG tablet Take 20 mg by mouth every morning.     . ferrous sulfate (IRON SUPPLEMENT) 325 (65 FE) MG tablet Take 325 mg by mouth at bedtime.     . furosemide (LASIX) 40 MG tablet TAKE ONE TABLET BY MOUTH TWICE DAILY 60 tablet 6  . ibuprofen (ADVIL,MOTRIN) 800 MG tablet Take 400-800 mg by mouth every 8 (eight) hours as needed for pain.     . Insulin Glargine (LANTUS SOLOSTAR) 100 UNIT/ML Solostar Pen  Inject 30 Units into the skin daily at 10 pm.    . isosorbide mononitrate (IMDUR) 30 MG 24 hr tablet TAKE ONE TABLET BY MOUTH ONCE DAILY 30 tablet 6  . lisinopril (PRINIVIL,ZESTRIL) 20 MG tablet Take 1 tablet (20 mg total) by mouth daily. 30 tablet 6  . metFORMIN (GLUCOPHAGE) 1000 MG tablet Take 1,000 mg by mouth 2 (two) times daily.    . nitroGLYCERIN (NITROSTAT) 0.4 MG SL tablet Place 1 tablet (0.4 mg total) under the tongue every 5 (five) minutes as needed for chest pain. 25 tablet 3  . pantoprazole (PROTONIX) 40 MG tablet Take 40 mg by mouth every morning.    . polyethylene glycol powder (GLYCOLAX/MIRALAX) powder Take 17 g by mouth daily.    [name]. spironolactone (ALDACTONE) 25 MG tablet TAKE ONE TABLET BY MOUTH ONCE DAILY 30 tablet 6   No current facility-administered medications for this visit.     Allergies:   Patient has no known allergies.    Social History:  The patient  reports that he quit smoking about 21 years ago. His smoking use included Cigarettes. He has a 30.00 pack-year smoking history. He has never used smokeless tobacco. He reports that he does not drink alcohol or use drugs.   Family History:  The patient's family history includes Cerebral aneurysm in his father and other; Diabetes in his other; Heart attack in his mother; Hypertension in his other.    ROS: All other systems are reviewed and negative. Unless otherwise mentioned in H&P    PHYSICAL EXAM: VS:  BP 122/76   Pulse 69   Ht 5\' 11"  (1.803 m)   Wt 237 lb (107.5 kg)   SpO2 95%   BMI 33.05 kg/m  , BMI Body mass index is 33.05 kg/m. GEN: Well nourished, well developed, in no acute distress  HEENT: normal  Neck: no JVD, carotid bruits, or masses Cardiac: RRR; no murmurs, rubs, or gallops,no edema  Respiratory:  Clear to auscultation bilaterally, normal work of breathing GI: soft, nontender, nondistended, + BS MS: no deformity or atrophy  Skin: warm and dry, no rash Neuro:  Strength and sensation are  intact Psych: euthymic mood, full affect   Recent Labs: 08/25/2016: BUN 17; Creatinine, Ser 1.28; Hemoglobin 9.7; Platelets 312; Potassium 3.6; Sodium 137    Lipid Panel    Component Value Date/Time   CHOL 231 (H) 09/12/2011 1711   TRIG 276 (H) 09/12/2011 1711   HDL 47 09/12/2011 1711   CHOLHDL 4.9 09/12/2011 1711   VLDL 55 (H) 09/12/2011 1711   LDLCALC 129 (H) 09/12/2011 1711      Wt Readings from Last 3 Encounters:  09/25/16 237 lb (107.5 kg)  08/25/16 240 lb (108.9 kg)  05/17/16 238 lb (108 kg)     ASSESSMENT AND PLAN:  1.  Coronary artery disease: He denies chest pain, shortness of breath, or fatigue. Due to blood when he wipes himself after a bowel movement and what he describes as blood as he urinates, I'm going to hold off on his aspirin. I'm going to order a CBC and a BMET for anemia and kidney function. He will continue all of his other medications as directed.  2. History of thoracic aortic aneurysm: He will need a follow-up CT scan next year.  3. Hypertension: Well-controlled currently. No changes in his medications.  4. Anemia: States he is having hematuria and also blood when he wipes himself after a bowel movement. CBC will be completed. He may need to have follow-up with GI if necessary. We'll defer to primary care. Labs will be copied to primary care.   Current medicines are reviewed at length with the patient today.    Labs/ tests ordered today include: BMET CBC  No orders of the defined types were placed in this encounter.    Disposition:   FU with 3 months with Dr. Purvis Sheffield in [place]   Signed, Joni Reining, NP  09/25/2016 3:02 PM    [facility] [address] [place], [place] [address] Phone: [phone]; Fax: [phone]

## 2016-09-25 NOTE — Patient Instructions (Signed)
Your physician recommends that you schedule a follow-up appointment with Dr. Purvis SheffieldKoneswaran in [place].  Your physician has recommended you make the following change in your medication:  Hold Aspirin   Your physician recommends that you have lab work done. (CBC, BMET)  If you need a refill on your cardiac medications before your next appointment, please call your pharmacy.  Thank you for choosing [facility]!

## 2016-10-30 ENCOUNTER — Other Ambulatory Visit: Payer: Self-pay | Admitting: Cardiovascular Disease

## 2016-11-17 ENCOUNTER — Encounter: Payer: Self-pay | Admitting: Cardiovascular Disease

## 2016-11-17 ENCOUNTER — Ambulatory Visit: Payer: Medicaid Other | Admitting: Cardiovascular Disease

## 2016-11-28 ENCOUNTER — Other Ambulatory Visit: Payer: Self-pay | Admitting: Cardiovascular Disease

## 2016-12-15 ENCOUNTER — Encounter: Payer: Self-pay | Admitting: Cardiovascular Disease

## 2016-12-15 ENCOUNTER — Ambulatory Visit (INDEPENDENT_AMBULATORY_CARE_PROVIDER_SITE_OTHER): Payer: Medicaid Other | Admitting: Cardiovascular Disease

## 2016-12-15 VITALS — BP 116/72 | HR 69 | Ht 71.0 in | Wt 241.0 lb

## 2016-12-15 DIAGNOSIS — Z9289 Personal history of other medical treatment: Secondary | ICD-10-CM

## 2016-12-15 DIAGNOSIS — I2510 Atherosclerotic heart disease of native coronary artery without angina pectoris: Secondary | ICD-10-CM

## 2016-12-15 DIAGNOSIS — I5032 Chronic diastolic (congestive) heart failure: Secondary | ICD-10-CM

## 2016-12-15 DIAGNOSIS — R002 Palpitations: Secondary | ICD-10-CM

## 2016-12-15 DIAGNOSIS — I712 Thoracic aortic aneurysm, without rupture, unspecified: Secondary | ICD-10-CM

## 2016-12-15 DIAGNOSIS — R079 Chest pain, unspecified: Secondary | ICD-10-CM | POA: Diagnosis not present

## 2016-12-15 DIAGNOSIS — I10 Essential (primary) hypertension: Secondary | ICD-10-CM

## 2016-12-15 NOTE — Progress Notes (Signed)
SUBJECTIVE: The patient returns for followup for nonobstructive coronary artery disease, thoracic aortic aneurysm, chronic diastolic heart failure, palpitations, and essential hypertension.  He was hospitalized for diastolic heart failure in October 2012 and underwent coronary angiography on 09/09/2011 which demonstrated mild nonobstructive coronary artery disease.  Hospitalized for chest pain in October 2017.  Nuclear stress test 09/19/16 was low risk with no evidence of myocardial ischemia or scar, LVEF 71%.  CT angiography of the chest 05/24/16 showed mild dilatation of the ascending thoracic aorta, 4.2 cm, and not significant change from prior study.  He is feeling very well and currently denies exertional chest pain and shortness of breath. He denies palpitations and leg swelling. He is now on Lantus 40 units and says his HbA1c is gradually trending downwards.  He enjoys working on cars and doing body work. He used to sell cars as well.   Review of Systems: As per "subjective", otherwise negative.  No Known Allergies  Current Outpatient Prescriptions  Medication Sig Dispense Refill  . acebutolol (SECTRAL) 200 MG capsule TAKE ONE CAPSULE BY MOUTH TWICE DAILY 60 capsule 11  . amLODipine (NORVASC) 10 MG tablet Take 1 tablet (10 mg total) by mouth daily. (Patient taking differently: Take 10 mg by mouth every morning. ) 30 tablet 6  . aspirin EC 81 MG tablet Take 81 mg by mouth daily.    [name] atorvastatin (LIPITOR) 40 MG tablet Take 1 tablet (40 mg total) by mouth every evening. 30 tablet 6  . busPIRone (BUSPAR) 10 MG tablet Take 10 mg by mouth 2 (two) times daily.    . citalopram (CELEXA) 20 MG tablet Take 20 mg by mouth every morning.     . ferrous sulfate (IRON SUPPLEMENT) 325 (65 FE) MG tablet Take 325 mg by mouth at bedtime.     . furosemide (LASIX) 40 MG tablet TAKE ONE TABLET BY MOUTH TWICE DAILY 60 tablet 6  . HYDROXYZINE PAMOATE PO Take 25 mg by mouth daily.    [name]  ibuprofen (ADVIL,MOTRIN) 800 MG tablet Take 400-800 mg by mouth every 8 (eight) hours as needed for pain.     . Insulin Glargine (LANTUS SOLOSTAR) 100 UNIT/ML Solostar Pen Inject 40 Units into the skin daily at 10 pm.     . isosorbide mononitrate (IMDUR) 30 MG 24 hr tablet TAKE ONE TABLET BY MOUTH ONCE DAILY 30 tablet 6  . lisinopril (PRINIVIL,ZESTRIL) 20 MG tablet Take 1 tablet (20 mg total) by mouth daily. 30 tablet 6  . metFORMIN (GLUCOPHAGE) 1000 MG tablet Take 1,000 mg by mouth 2 (two) times daily.    . nitroGLYCERIN (NITROSTAT) 0.4 MG SL tablet Place 1 tablet (0.4 mg total) under the tongue every 5 (five) minutes as needed for chest pain. 25 tablet 3  . pantoprazole (PROTONIX) 40 MG tablet Take 40 mg by mouth every morning.    . polyethylene glycol powder (GLYCOLAX/MIRALAX) powder Take 17 g by mouth daily.    [name] spironolactone (ALDACTONE) 25 MG tablet TAKE ONE TABLET BY MOUTH ONCE DAILY 30 tablet 6   No current facility-administered medications for this visit.     Past Medical History:  Diagnosis Date  . Amputation finger    left ring finger  . Anxiety   . CAD (coronary artery disease)    LHC 10/12:  mild plaque in the LAD but no obstructive CAD  . CHF (congestive heart failure) (HCC)   . Chronic diastolic heart failure (HCC)    EF 55-60%,  grade 1 diastolic dysfunction; no WMAs, echo, 11/2011; s/p pulmo. edema c/b VDRF 11/12; Echo 09/08/11: severe LVH, no SAM, no LVOT gradient, EF 55%, mild BAE, mild reduced RVSF.  [name]. CKD (chronic kidney disease)   . Constipation    "due to medication"  . Depression   . Diabetes mellitus    A1C 7.3 08/2011  . GERD (gastroesophageal reflux disease)   . History of kidney stones   . History of right ACA stroke    10/12  --  MRI 09/13/11:  Acute right ACA infarct involving corpus callosum.  MRA 09/13/11: severe intracranial ASD, right ACA occluded, left PCA and right MCA with severe disease.  Dopplers were neg for significant ICA stenosis  . History  of transfusion   . HLD (hyperlipidemia)   . Hypertension   . Myocardial infarction 07/2011  . Renal insufficiency   . Shortness of breath dyspnea    with exertion  . Spermatocele   . Thoracic aortic aneurysm (HCC)    CT in 10/12: 4.4 cm ascending thoracic aortic aneurysm  . Tongue tied    "at times I get tongue tied since the stroke"    Past Surgical History:  Procedure Laterality Date  .  vasectomy     bilateral  . CARDIAC CATHETERIZATION  09/09/11  . ROTATOR CUFF REPAIR  07/2015   LEFT  . SPERMATOCELECTOMY  1990's   RT side  . SPERMATOCELECTOMY Left 07/20/2015   Procedure: LEFT HYDROCELECTOMY;  Surgeon: Bjorn PippinJohn Wrenn, MD;  Location: [facility];  Service: Urology;  Laterality: Left;  . TRANSURETHRAL INCISION OF PROSTATE N/A 07/20/2015   Procedure: TRANSURETHRAL INCISION OF THE PROSTATE (TUIP);  Surgeon: Bjorn PippinJohn Wrenn, MD;  Location: [facility];  Service: Urology;  Laterality: N/A;    Social History   Social History  . Marital status: Divorced    Spouse name: N/A  . Number of children: N/A  . Years of education: N/A   Occupational History  . Not on file.   Social History Main Topics  . Smoking status: Former Smoker    Packs/day: 1.00    Years: 30.00    Types: Cigarettes    Quit date: 11/13/1994  . Smokeless tobacco: Never Used  . Alcohol use No  . Drug use: No  . Sexual activity: Not Currently   Other Topics Concern  . Not on file   Social History Narrative  . No narrative on file     Vitals:   12/15/16 0810  BP: 116/72  Pulse: 69  SpO2: 96%  Weight: 241 lb (109.3 kg)  Height: 5\' 11"  (1.803 m)    PHYSICAL EXAM General: NAD HEENT: Normal. Neck: No JVD, no thyromegaly. Lungs: Clear to auscultation bilaterally with normal respiratory effort. CV: Nondisplaced PMI.  Regular rate and rhythm, normal S1/S2, no S3/S4, no murmur. No pretibial or periankle edema.  No carotid bruit.   Abdomen: Soft, nontender, no distention.  Neurologic: Alert and oriented.  Psych: Normal  affect. Skin: Normal. Musculoskeletal: No gross deformities.    ECG: Most recent ECG reviewed.      ASSESSMENT AND PLAN:  CAD  Symptomatically stable. Continue aggressive medical management. Patient had nonobstructive CAD by cardiac catheterization, 08/2011, and normal stress test in 09/2016, reviewed above. Continue ASA and Lipitor.  Chronic diastolic heart failure  Euvolemic by history and exam. Continue current diuretic regimen.  Palpitations  Quiescent on current medication regimen, which includes acebutolol.   Essential hypertension Controlled. No changes.  Thoracic aortic aneurysm  4.2 cm  in July 2017 by CT angiogram. Will repeat in one year.  Dispo: f/u 1 year.    Prentice Docker, M.D., F.A.C.C.

## 2016-12-15 NOTE — Patient Instructions (Signed)
Your physician wants you to follow-up in: 1 YEAR WITH DR KONESWARAN You will receive a reminder letter in the mail two months in advance. If you don't receive a letter, please call our office to schedule the follow-up appointment.  Your physician recommends that you continue on your current medications as directed. Please refer to the Current Medication list given to you today.  Thank you for choosing [facility]!!    

## 2016-12-19 ENCOUNTER — Other Ambulatory Visit: Payer: Self-pay | Admitting: Cardiovascular Disease

## 2017-06-04 ENCOUNTER — Other Ambulatory Visit: Payer: Self-pay | Admitting: Cardiovascular Disease

## 2017-06-26 ENCOUNTER — Other Ambulatory Visit: Payer: Self-pay | Admitting: Cardiovascular Disease

## 2017-07-18 ENCOUNTER — Other Ambulatory Visit: Payer: Self-pay | Admitting: Cardiovascular Disease

## 2017-08-13 ENCOUNTER — Other Ambulatory Visit: Payer: Self-pay | Admitting: Cardiovascular Disease

## 2017-11-01 ENCOUNTER — Other Ambulatory Visit: Payer: Self-pay | Admitting: Cardiovascular Disease

## 2017-11-01 MED ORDER — ACEBUTOLOL HCL 200 MG PO CAPS: 200.0000 mg | ORAL_CAPSULE | Freq: Two times a day (BID) | ORAL | 2 refills | Status: DC

## 2017-11-01 NOTE — Telephone Encounter (Signed)
RX Refilled 

## 2017-11-01 NOTE — Telephone Encounter (Signed)
acebutolol (SECTRAL) 200 MG capsule   [facility] [place], [place]

## 2017-12-25 ENCOUNTER — Ambulatory Visit: Payer: Self-pay | Admitting: Cardiovascular Disease

## 2018-01-22 ENCOUNTER — Ambulatory Visit: Payer: Medicaid Other | Admitting: Cardiovascular Disease

## 2018-01-22 ENCOUNTER — Encounter: Payer: Self-pay | Admitting: Cardiovascular Disease

## 2018-01-22 ENCOUNTER — Other Ambulatory Visit: Payer: Self-pay

## 2018-01-22 ENCOUNTER — Telehealth: Payer: Self-pay | Admitting: Cardiovascular Disease

## 2018-01-22 VITALS — BP 128/79 | HR 73 | Ht 70.0 in | Wt 248.0 lb

## 2018-01-22 DIAGNOSIS — I25118 Atherosclerotic heart disease of native coronary artery with other forms of angina pectoris: Secondary | ICD-10-CM

## 2018-01-22 DIAGNOSIS — I712 Thoracic aortic aneurysm, without rupture, unspecified: Secondary | ICD-10-CM

## 2018-01-22 DIAGNOSIS — I5032 Chronic diastolic (congestive) heart failure: Secondary | ICD-10-CM | POA: Diagnosis not present

## 2018-01-22 DIAGNOSIS — R002 Palpitations: Secondary | ICD-10-CM | POA: Diagnosis not present

## 2018-01-22 DIAGNOSIS — I10 Essential (primary) hypertension: Secondary | ICD-10-CM | POA: Diagnosis not present

## 2018-01-22 NOTE — Progress Notes (Signed)
SUBJECTIVE: The patient returns for followup for nonobstructive coronary artery disease, thoracic aortic aneurysm, chronic diastolic heart failure, palpitations, and essential hypertension.  He was hospitalized for diastolic heart failure in October 2012 and underwent coronary angiography on 09/09/2011 which demonstrated mild nonobstructive coronary artery disease.  Hospitalized for chest pain in October 2017.  Nuclear stress test 09/19/16 was low risk with no evidence of myocardial ischemia or scar, LVEF 71%.  CT angiography of the chest 05/24/16 showed mild dilatation of the ascending thoracic aorta, 4.2 cm, and not significant change from prior study.  He has been doing very well.  He had one episode of chest pain 2 weeks ago which resolved on its own but has had no other symptoms.  Specifically, he denies shortness of breath, palpitations, leg swelling, orthopnea, and dizziness.  He has changed his diet and wants to lose 40 pounds but would be happy if he lost 25 pounds.  He told me about his son and daughter.  His son lives in [place] and graduated from [address]  His daughter lives in [place] and also graduated from [employer].  She used to be a Biochemist, clinical for the [employer] and now is an Print production planner with [employer] and also teaches dance.    Soc Hx: He enjoys working on cars and doing body work. He used to sell cars as well. His son lives in [place] and graduated from [address]  His daughter lives in [place] and also graduated from [employer].  She used to be a Biochemist, clinical for the [employer] and now is an Print production planner with [employer] and also teaches dance.  Review of Systems: As per "subjective", otherwise negative.  No Known Allergies  Current Outpatient Medications  Medication Sig Dispense Refill  . acebutolol (SECTRAL) 200 MG capsule Take 1 capsule (200 mg total) by mouth 2 (two) times daily. 60 capsule 2  . amLODipine (NORVASC) 10 MG  tablet Take 1 tablet (10 mg total) by mouth daily. (Patient taking differently: Take 10 mg by mouth every morning. ) 30 tablet 6  . aspirin EC 81 MG tablet Take 81 mg by mouth daily.    [name] atorvastatin (LIPITOR) 40 MG tablet Take 1 tablet (40 mg total) by mouth every evening. 30 tablet 6  . busPIRone (BUSPAR) 10 MG tablet Take 10 mg by mouth 2 (two) times daily.    . citalopram (CELEXA) 20 MG tablet Take 20 mg by mouth every morning.     . ferrous sulfate (IRON SUPPLEMENT) 325 (65 FE) MG tablet Take 325 mg by mouth at bedtime.     . furosemide (LASIX) 40 MG tablet TAKE ONE TABLET BY MOUTH TWICE DAILY 60 tablet 6  . HYDROXYZINE PAMOATE PO Take 25 mg by mouth daily.    [name] ibuprofen (ADVIL,MOTRIN) 800 MG tablet Take 400-800 mg by mouth every 8 (eight) hours as needed for pain.     . Insulin Glargine (LANTUS SOLOSTAR) 100 UNIT/ML Solostar Pen Inject 40 Units into the skin daily at 10 pm.     . isosorbide mononitrate (IMDUR) 30 MG 24 hr tablet TAKE ONE TABLET BY MOUTH ONCE DAILY 90 tablet 3  . lisinopril (PRINIVIL,ZESTRIL) 20 MG tablet Take 1 tablet (20 mg total) by mouth daily. 30 tablet 6  . metFORMIN (GLUCOPHAGE) 1000 MG tablet Take 1,000 mg by mouth 2 (two) times daily.    . nitroGLYCERIN (NITROSTAT) 0.4 MG SL tablet Place 1 tablet (0.4 mg total) under the tongue  every 5 (five) minutes as needed for chest pain. 25 tablet 3  . pantoprazole (PROTONIX) 40 MG tablet Take 40 mg by mouth every morning.    . polyethylene glycol powder (GLYCOLAX/MIRALAX) powder Take 17 g by mouth daily.    [name] spironolactone (ALDACTONE) 25 MG tablet TAKE ONE TABLET BY MOUTH ONCE DAILY 30 tablet 6   No current facility-administered medications for this visit.     Past Medical History:  Diagnosis Date  . Amputation finger    left ring finger  . Anxiety   . CAD (coronary artery disease)    LHC 10/12:  mild plaque in the LAD but no obstructive CAD  . CHF (congestive heart failure) (HCC)   . Chronic diastolic heart  failure (HCC)    EF 55-60%, grade 1 diastolic dysfunction; no WMAs, echo, 11/2011; s/p pulmo. edema c/b VDRF 11/12; Echo 09/08/11: severe LVH, no SAM, no LVOT gradient, EF 55%, mild BAE, mild reduced RVSF.  [name] CKD (chronic kidney disease)   . Constipation    "due to medication"  . Depression   . Diabetes mellitus    A1C 7.3 08/2011  . GERD (gastroesophageal reflux disease)   . History of kidney stones   . History of right ACA stroke    10/12  --  MRI 09/13/11:  Acute right ACA infarct involving corpus callosum.  MRA 09/13/11: severe intracranial ASD, right ACA occluded, left PCA and right MCA with severe disease.  Dopplers were neg for significant ICA stenosis  . History of transfusion   . HLD (hyperlipidemia)   . Hypertension   . Myocardial infarction (HCC) 07/2011  . Renal insufficiency   . Shortness of breath dyspnea    with exertion  . Spermatocele   . Thoracic aortic aneurysm (HCC)    CT in 10/12: 4.4 cm ascending thoracic aortic aneurysm  . Tongue tied    "at times I get tongue tied since the stroke"    Past Surgical History:  Procedure Laterality Date  .  vasectomy     bilateral  . CARDIAC CATHETERIZATION  09/09/11  . ROTATOR CUFF REPAIR  07/2015   LEFT  . SPERMATOCELECTOMY  1990's   RT side  . SPERMATOCELECTOMY Left 07/20/2015   Procedure: LEFT HYDROCELECTOMY;  Surgeon: Bjorn Pippin, MD;  Location: [facility];  Service: Urology;  Laterality: Left;  . TRANSURETHRAL INCISION OF PROSTATE N/A 07/20/2015   Procedure: TRANSURETHRAL INCISION OF THE PROSTATE (TUIP);  Surgeon: Bjorn Pippin, MD;  Location: [facility];  Service: Urology;  Laterality: N/A;    Social History   Socioeconomic History  . Marital status: Divorced    Spouse name: Not on file  . Number of children: Not on file  . Years of education: Not on file  . Highest education level: Not on file  Social Needs  . Financial resource strain: Not on file  . Food insecurity - worry: Not on file  . Food insecurity - inability:  Not on file  . Transportation needs - medical: Not on file  . Transportation needs - non-medical: Not on file  Occupational History  . Not on file  Tobacco Use  . Smoking status: Former Smoker    Packs/day: 1.00    Years: 30.00    Pack years: 30.00    Types: Cigarettes    Last attempt to quit: 11/13/1994    Years since quitting: 23.2  . Smokeless tobacco: Never Used  Substance and Sexual Activity  . Alcohol use: No  . Drug  use: No  . Sexual activity: Not Currently  Other Topics Concern  . Not on file  Social History Narrative  . Not on file     Vitals:   01/22/18 1250  Weight: 248 lb (112.5 kg)  Height: 5\' 10"  (1.778 m)    Wt Readings from Last 3 Encounters:  01/22/18 248 lb (112.5 kg)  12/15/16 241 lb (109.3 kg)  09/25/16 237 lb (107.5 kg)     PHYSICAL EXAM General: NAD HEENT: Normal. Neck: No JVD, no thyromegaly. Lungs: Clear to auscultation bilaterally with normal respiratory effort. CV: Regular rate and rhythm, normal S1/S2, no S3/S4, no murmur. No pretibial or periankle edema.  No carotid bruit.   Abdomen: Soft, nontender, no distention.  Neurologic: Alert and oriented.  Psych: Normal affect. Skin: Normal. Musculoskeletal: No gross deformities.    ECG: Most recent ECG reviewed.   Labs: Lab Results  Component Value Date/Time   K 4.1 09/25/2016 03:24 PM   BUN 13 09/25/2016 03:24 PM   CREATININE 1.62 (H) 09/25/2016 03:24 PM   TSH 1.117 09/09/2011 07:00 AM   HGB 9.9 (L) 09/25/2016 03:24 PM     Lipids: Lab Results  Component Value Date/Time   LDLCALC 129 (H) 09/12/2011 05:11 PM   CHOL 231 (H) 09/12/2011 05:11 PM   TRIG 276 (H) 09/12/2011 05:11 PM   HDL 47 09/12/2011 05:11 PM       ASSESSMENT AND PLAN:  CAD  Symptomatically stable. Continue aggressive medical management. Patient had nonobstructive CAD by cardiac catheterization, 08/2011, and normal stress test in 09/2016, reviewed above. Continue ASA, Imdur and Lipitor.  Chronic diastolic  heart failure Euvolemic by history and exam. Continue Lasix and spironolactone.  Blood pressure is normal.  Palpitations Quiescent on current medication regimen, which includes acebutolol.   Essential hypertension Controlled. No changes.  Thoracic aortic aneurysm 4.2 cm in July 2017 by CT angiogram. Will repeat CT.     Disposition: Follow up 1 year.   Prentice DockerSuresh Hollie Wojahn, M.D., F.A.C.C.

## 2018-01-22 NOTE — Telephone Encounter (Signed)
Pre-cert Verification for the following procedure   CT [facility]01/2018  [facility]

## 2018-01-22 NOTE — Patient Instructions (Signed)
Your physician wants you to follow-up in: 1 YEAR WITH DR Reggy EyeKONESWARAN You will receive a reminder letter in the mail two months in advance. If you don't receive a letter, please call our office to schedule the follow-up appointment.  Your physician recommends that you continue on your current medications as directed. Please refer to the Current Medication list given to you today.  Your physician recommends that you return for lab work BMP - ORDER GIVEN TO YOU TODAY  Non-Cardiac CT scanning, (CAT scanning), is a noninvasive, special x-ray that produces cross-sectional images of the body using x-rays and a computer. CT scans help physicians diagnose and treat medical conditions. For some CT exams, a contrast material is used to enhance visibility in the area of the body being studied. CT scans provide greater clarity and reveal more details than regular x-ray exams.  Thank you for choosing [facility]!!

## 2018-01-28 ENCOUNTER — Encounter: Payer: Self-pay | Admitting: *Deleted

## 2018-02-01 ENCOUNTER — Ambulatory Visit (HOSPITAL_COMMUNITY)
Admission: RE | Admit: 2018-02-01 | Discharge: 2018-02-01 | Disposition: A | Discharge status: Home / Self Care | Payer: Medicaid Other | Source: Ambulatory Visit | Attending: Cardiovascular Disease | Admitting: Cardiovascular Disease

## 2018-02-01 DIAGNOSIS — I700 Atherosclerosis of aorta: Secondary | ICD-10-CM | POA: Diagnosis not present

## 2018-02-01 DIAGNOSIS — I712 Thoracic aortic aneurysm, without rupture, unspecified: Secondary | ICD-10-CM

## 2018-02-01 LAB — POCT I-STAT CREATININE: Creatinine, Ser: 1.4 mg/dL — ABNORMAL HIGH (ref 0.61–1.24)

## 2018-02-01 MED ORDER — IOPAMIDOL (ISOVUE-370) INJECTION 76%
100.0000 mL | Freq: Once | INTRAVENOUS | Status: AC | PRN
  Administered 2018-02-01: 100 mL via INTRAVENOUS

## 2018-02-12 ENCOUNTER — Telehealth: Payer: Self-pay | Admitting: *Deleted

## 2018-02-12 NOTE — Telephone Encounter (Signed)
Notes recorded by Lesle ChrisHill, Teigen Bellin G, LPN on 0/9/81194/12/2017 at 12:06 PM EDT Patient notified. Copy to pmd. ------  Notes recorded by Laqueta LindenKoneswaran, Suresh A, MD on 02/04/2018 at 3:56 PM EDT Creatinine improved from 09/2016.

## 2018-02-14 ENCOUNTER — Telehealth: Payer: Self-pay | Admitting: Cardiovascular Disease

## 2018-02-14 NOTE — Telephone Encounter (Signed)
[name] called the office stating that [name]. Will forward.

## 2018-02-15 ENCOUNTER — Other Ambulatory Visit: Payer: Self-pay | Admitting: Cardiovascular Disease

## 2018-02-15 NOTE — Telephone Encounter (Signed)
Notes recorded by Lesle ChrisHill, Lyan Moyano G, LPN on 1/6/10964/03/2018 at 5:36 PM EDT Patient notified. Copy to pmd. ------  Notes recorded by Lesle ChrisHill, Justin Meisenheimer G, LPN on 0/4/54094/01/2018 at 3:52 PM EDT Left message to return call.  ------  Notes recorded by Laqueta LindenKoneswaran, Suresh A, MD on 02/04/2018 at 9:22 AM EDT Stable and mild. Repeat in one year.

## 2018-04-12 ENCOUNTER — Other Ambulatory Visit: Payer: Self-pay | Admitting: Cardiovascular Disease

## 2018-04-26 ENCOUNTER — Ambulatory Visit: Payer: Medicaid Other | Admitting: Urology

## 2018-04-26 DIAGNOSIS — N401 Enlarged prostate with lower urinary tract symptoms: Secondary | ICD-10-CM | POA: Diagnosis not present

## 2018-04-26 DIAGNOSIS — R351 Nocturia: Secondary | ICD-10-CM

## 2018-04-26 DIAGNOSIS — R81 Glycosuria: Secondary | ICD-10-CM | POA: Diagnosis not present

## 2018-04-26 DIAGNOSIS — N481 Balanitis: Secondary | ICD-10-CM | POA: Diagnosis not present

## 2018-04-26 DIAGNOSIS — N5203 Combined arterial insufficiency and corporo-venous occlusive erectile dysfunction: Secondary | ICD-10-CM

## 2018-04-28 ENCOUNTER — Other Ambulatory Visit: Payer: Self-pay | Admitting: Cardiovascular Disease

## 2018-06-05 ENCOUNTER — Other Ambulatory Visit: Payer: Self-pay | Admitting: Cardiovascular Disease

## 2018-09-25 ENCOUNTER — Other Ambulatory Visit: Payer: Self-pay | Admitting: Cardiovascular Disease

## 2018-11-02 ENCOUNTER — Other Ambulatory Visit: Payer: Self-pay | Admitting: Cardiovascular Disease

## 2018-11-16 ENCOUNTER — Emergency Department (HOSPITAL_COMMUNITY): Payer: Medicaid Other

## 2018-11-16 ENCOUNTER — Emergency Department (HOSPITAL_COMMUNITY)
Admission: EM | Admit: 2018-11-16 | Discharge: 2018-11-16 | Discharge status: Against Medical Advice | Payer: Medicaid Other | Attending: Emergency Medicine | Admitting: Emergency Medicine

## 2018-11-16 ENCOUNTER — Other Ambulatory Visit: Payer: Self-pay

## 2018-11-16 ENCOUNTER — Encounter (HOSPITAL_COMMUNITY): Payer: Self-pay | Admitting: Emergency Medicine

## 2018-11-16 DIAGNOSIS — N182 Chronic kidney disease, stage 2 (mild): Secondary | ICD-10-CM | POA: Insufficient documentation

## 2018-11-16 DIAGNOSIS — R059 Cough, unspecified: Secondary | ICD-10-CM

## 2018-11-16 DIAGNOSIS — I2510 Atherosclerotic heart disease of native coronary artery without angina pectoris: Secondary | ICD-10-CM | POA: Insufficient documentation

## 2018-11-16 DIAGNOSIS — I130 Hypertensive heart and chronic kidney disease with heart failure and stage 1 through stage 4 chronic kidney disease, or unspecified chronic kidney disease: Secondary | ICD-10-CM | POA: Insufficient documentation

## 2018-11-16 DIAGNOSIS — Z87891 Personal history of nicotine dependence: Secondary | ICD-10-CM | POA: Insufficient documentation

## 2018-11-16 DIAGNOSIS — E119 Type 2 diabetes mellitus without complications: Secondary | ICD-10-CM | POA: Diagnosis not present

## 2018-11-16 DIAGNOSIS — R05 Cough: Secondary | ICD-10-CM | POA: Diagnosis not present

## 2018-11-16 DIAGNOSIS — Z794 Long term (current) use of insulin: Secondary | ICD-10-CM | POA: Insufficient documentation

## 2018-11-16 DIAGNOSIS — R0789 Other chest pain: Secondary | ICD-10-CM | POA: Insufficient documentation

## 2018-11-16 DIAGNOSIS — Z7982 Long term (current) use of aspirin: Secondary | ICD-10-CM | POA: Diagnosis not present

## 2018-11-16 DIAGNOSIS — I5032 Chronic diastolic (congestive) heart failure: Secondary | ICD-10-CM | POA: Diagnosis not present

## 2018-11-16 DIAGNOSIS — R079 Chest pain, unspecified: Secondary | ICD-10-CM | POA: Diagnosis present

## 2018-11-16 DIAGNOSIS — R9431 Abnormal electrocardiogram [ECG] [EKG]: Secondary | ICD-10-CM

## 2018-11-16 DIAGNOSIS — Z79899 Other long term (current) drug therapy: Secondary | ICD-10-CM | POA: Insufficient documentation

## 2018-11-16 LAB — CBC WITH DIFFERENTIAL/PLATELET
Abs Immature Granulocytes: 0.01 10*3/uL (ref 0.00–0.07)
Basophils Absolute: 0 10*3/uL (ref 0.0–0.1)
Basophils Relative: 1 %
Eosinophils Absolute: 0.2 10*3/uL (ref 0.0–0.5)
Eosinophils Relative: 3 %
HCT: 40.8 % (ref 39.0–52.0)
Hemoglobin: 13.4 g/dL (ref 13.0–17.0)
Immature Granulocytes: 0 %
Lymphocytes Relative: 41 %
Lymphs Abs: 3.1 10*3/uL (ref 0.7–4.0)
MCH: 29 pg (ref 26.0–34.0)
MCHC: 32.8 g/dL (ref 30.0–36.0)
MCV: 88.3 fL (ref 80.0–100.0)
Monocytes Absolute: 0.8 10*3/uL (ref 0.1–1.0)
Monocytes Relative: 11 %
Neutro Abs: 3.3 10*3/uL (ref 1.7–7.7)
Neutrophils Relative %: 44 %
Platelets: 369 10*3/uL (ref 150–400)
RBC: 4.62 MIL/uL (ref 4.22–5.81)
RDW: 13.5 % (ref 11.5–15.5)
WBC: 7.4 10*3/uL (ref 4.0–10.5)
nRBC: 0 % (ref 0.0–0.2)

## 2018-11-16 LAB — BASIC METABOLIC PANEL
Anion gap: 10 (ref 5–15)
BUN: 13 mg/dL (ref 8–23)
CO2: 24 mmol/L (ref 22–32)
Calcium: 8.7 mg/dL — ABNORMAL LOW (ref 8.9–10.3)
Chloride: 101 mmol/L (ref 98–111)
Creatinine, Ser: 1.08 mg/dL (ref 0.61–1.24)
GFR calc Af Amer: >60 mL/min (ref >60)
GFR calc non Af Amer: >60 mL/min (ref >60)
Glucose, Bld: 247 mg/dL — ABNORMAL HIGH (ref 70–99)
Potassium: 3.3 mmol/L — ABNORMAL LOW (ref 3.5–5.1)
Sodium: 135 mmol/L (ref 135–145)

## 2018-11-16 LAB — BRAIN NATRIURETIC PEPTIDE: B Natriuretic Peptide: 29 pg/mL (ref 0.0–100.0)

## 2018-11-16 LAB — TROPONIN I: Troponin I: <0.03 ng/mL (ref <0.03)

## 2018-11-16 MED ORDER — IOPAMIDOL (ISOVUE-370) INJECTION 76%
100.0000 mL | Freq: Once | INTRAVENOUS | Status: AC | PRN
  Administered 2018-11-16: 100 mL via INTRAVENOUS

## 2018-11-16 MED ORDER — BENZONATATE 100 MG PO CAPS: 100.0000 mg | ORAL_CAPSULE | Freq: Three times a day (TID) | ORAL | 0 refills | Status: DC | PRN

## 2018-11-16 MED ORDER — DOXYCYCLINE HYCLATE 100 MG PO CAPS: 100.0000 mg | ORAL_CAPSULE | Freq: Two times a day (BID) | ORAL | 0 refills | Status: DC

## 2018-11-16 NOTE — ED Provider Notes (Signed)
[facility] EMERGENCY DEPARTMENT Provider Note   CSN: [id number] Arrival date & time: 11/16/18  0327     History   Chief Complaint Chief Complaint  Patient presents with  . Chest Pain    HPI Matthew Cain is a 63 y.o. male.  Patient reports he came to the ED because he "needs help getting over this cold and flu".  States he said congestion and cough for the past 1 week that is progressively worsening.  Cough is productive of yellow and green mucus.  States he has been having central chest pain since last week that is progressively worsening and becoming more painful and constant.  Describes pain in the center of his chest does not radiate to his arm, back or neck.  Associated with some shortness of breath.  No fevers, chills, nausea or vomiting.  Patient with known CAD, previous stroke, diastolic heart failure, diabetes, thoracic aortic aneurysm, hypertension hyperlipidemia.  He states that he believes the chest pain is due to his cold and not from his heart.  He states it is somewhat worse with coughing but is constantly progressive and is even there when he is not coughing.  When asked to describe his chest pain further, patient states it is "congestion pain" only with coughing.  He would like to recant his previous statement that it was something "standing on his chest".  He reports the pain is only in the center of his chest only when he coughs and feels like "congestion pain".  The history is provided by the patient.  Chest Pain   Associated symptoms include cough and shortness of breath. Pertinent negatives include no abdominal pain, no dizziness, no fever, no headaches, no nausea, no vomiting and no weakness.    Past Medical History:  Diagnosis Date  . Amputation finger    left ring finger  . Anxiety   . CAD (coronary artery disease)    LHC 10/12:  mild plaque in the LAD but no obstructive CAD  . CHF (congestive heart failure) (HCC)   . Chronic diastolic heart failure (HCC)    EF 55-60%, grade 1 diastolic dysfunction; no WMAs, echo, 11/2011; s/p pulmo. edema c/b VDRF 11/12; Echo 09/08/11: severe LVH, no SAM, no LVOT gradient, EF 55%, mild BAE, mild reduced RVSF.  [name] CKD (chronic kidney disease)   . Constipation    "due to medication"  . Depression   . Diabetes mellitus    A1C 7.3 08/2011  . GERD (gastroesophageal reflux disease)   . History of kidney stones   . History of right ACA stroke    10/12  --  MRI 09/13/11:  Acute right ACA infarct involving corpus callosum.  MRA 09/13/11: severe intracranial ASD, right ACA occluded, left PCA and right MCA with severe disease.  Dopplers were neg for significant ICA stenosis  . History of transfusion   . HLD (hyperlipidemia)   . Hypertension   . Myocardial infarction (HCC) 07/2011  . Renal insufficiency   . Shortness of breath dyspnea    with exertion  . Spermatocele   . Thoracic aortic aneurysm (HCC)    CT in 10/12: 4.4 cm ascending thoracic aortic aneurysm  . Tongue tied    "at times I get tongue tied since the stroke"    Patient Active Problem List   Diagnosis Date Noted  . CKD stage 2 due to type 2 diabetes mellitus (HCC) 08/26/2016  . Chest pain 08/25/2016  . Hydrocele, left 07/21/2015  . BPH with obstruction/lower  urinary tract symptoms 07/20/2015  . Palpitations 03/13/2012  . Panic disorder 02/13/2012  . Thoracic aortic aneurysm (HCC) 11/23/2011  . Chronic diastolic heart failure (HCC)   . CVA (cerebral infarction) 09/17/2011  . CAD (coronary artery disease), native coronary artery 09/17/2011  . HTN (hypertension) 09/17/2011  . Dyslipidemia 09/17/2011  . DM (diabetes mellitus) (HCC) 09/17/2011  . Renal insufficiency 09/17/2011  . Pneumonia 09/17/2011  . Myocardial infarction (HCC) 07/15/2011    Past Surgical History:  Procedure Laterality Date  .  vasectomy     bilateral  . CARDIAC CATHETERIZATION  09/09/11  . ROTATOR CUFF REPAIR  07/2015   LEFT  . SPERMATOCELECTOMY  1990's   RT side  .  SPERMATOCELECTOMY Left 07/20/2015   Procedure: LEFT HYDROCELECTOMY;  Surgeon: Bjorn PippinJohn Wrenn, MD;  Location: [facility];  Service: Urology;  Laterality: Left;  . TRANSURETHRAL INCISION OF PROSTATE N/A 07/20/2015   Procedure: TRANSURETHRAL INCISION OF THE PROSTATE (TUIP);  Surgeon: Bjorn PippinJohn Wrenn, MD;  Location: [facility];  Service: Urology;  Laterality: N/A;        Home Medications    Prior to Admission medications   Medication Sig Start Date End Date Taking? Authorizing Provider  acebutolol (SECTRAL) 200 MG capsule Take 1 capsule (200 mg total) by mouth 2 (two) times daily. 11/01/17   Laqueta LindenKoneswaran, Suresh A, MD  acebutolol (SECTRAL) 200 MG capsule TAKE 1 CAPSULE BY MOUTH TWICE DAILY 04/29/18   Laqueta LindenKoneswaran, Suresh A, MD  amLODipine (NORVASC) 10 MG tablet Take 1 tablet (10 mg total) by mouth daily. Patient taking differently: Take 10 mg by mouth every morning.  09/18/12   Serpe, Clide DeutscherEugene C, PA-C  aspirin EC 81 MG tablet Take 81 mg by mouth daily.    [provider]  atorvastatin (LIPITOR) 40 MG tablet Take 1 tablet (40 mg total) by mouth every evening. 09/18/12   Serpe, Clide DeutscherEugene C, PA-C  busPIRone (BUSPAR) 10 MG tablet Take 10 mg by mouth 2 (two) times daily.    [provider]  citalopram (CELEXA) 20 MG tablet Take 20 mg by mouth every morning.     [provider]  ferrous sulfate (IRON SUPPLEMENT) 325 (65 FE) MG tablet Take 325 mg by mouth at bedtime.     [provider]  furosemide (LASIX) 40 MG tablet TAKE 1 TABLET BY MOUTH TWICE DAILY 11/04/18   Laqueta LindenKoneswaran, Suresh A, MD  HYDROXYZINE PAMOATE PO Take 25 mg by mouth daily.    [provider]  ibuprofen (ADVIL,MOTRIN) 800 MG tablet Take 400-800 mg by mouth every 8 (eight) hours as needed for pain.     [provider]  Insulin Glargine (LANTUS SOLOSTAR) 100 UNIT/ML Solostar Pen Inject 40 Units into the skin daily at 10 pm.     [provider]  isosorbide mononitrate (IMDUR) 30 MG 24 hr tablet TAKE 1 TABLET  BY MOUTH ONCE DAILY 06/06/18   Laqueta LindenKoneswaran, Suresh A, MD  lisinopril (PRINIVIL,ZESTRIL) 20 MG tablet Take 1 tablet (20 mg total) by mouth daily. 05/14/15   Laqueta LindenKoneswaran, Suresh A, MD  loratadine (CLARITIN) 10 MG tablet Take 10 mg by mouth daily.    [provider]  metFORMIN (GLUCOPHAGE) 1000 MG tablet Take 1,000 mg by mouth 2 (two) times daily.    [provider]  nitroGLYCERIN (NITROSTAT) 0.4 MG SL tablet Place 1 tablet (0.4 mg total) under the tongue every 5 (five) minutes as needed for chest pain. 09/25/16   Jodelle GrossLawrence, Kathryn M, NP  pantoprazole (PROTONIX) 40 MG tablet Take  40 mg by mouth every morning.    [provider]  polyethylene glycol powder (GLYCOLAX/MIRALAX) powder Take 17 g by mouth daily.    [provider]  spironolactone (ALDACTONE) 25 MG tablet TAKE 1 TABLET BY MOUTH ONCE DAILY 09/25/18   Laqueta Linden, MD    Family History Family History  Problem Relation Age of Onset  . Heart attack Mother   . Cerebral aneurysm Father   . Cerebral aneurysm Other   . Diabetes Other   . Hypertension Other     Social History Social History   Tobacco Use  . Smoking status: Former Smoker    Packs/day: 1.00    Years: 30.00    Pack years: 30.00    Types: Cigarettes    Last attempt to quit: 11/13/1994    Years since quitting: 24.0  . Smokeless tobacco: Never Used  Substance Use Topics  . Alcohol use: No  . Drug use: No     Allergies   Patient has no known allergies.   Review of Systems Review of Systems  Constitutional: Negative for activity change, appetite change and fever.  HENT: Positive for congestion and rhinorrhea.   Respiratory: Positive for cough, chest tightness and shortness of breath.   Cardiovascular: Positive for chest pain.  Gastrointestinal: Negative for abdominal pain, nausea and vomiting.  Genitourinary: Negative for dysuria and hematuria.  Musculoskeletal: Negative for myalgias.  Neurological: Negative for dizziness,  weakness and headaches.    all other systems are negative except as noted in the HPI and PMH.    Physical Exam Updated Vital Signs BP (!) 191/86 (BP Location: Left Arm)   Pulse 76   Temp 98.2 F (36.8 C) (Oral)   Resp 15   SpO2 98%   Physical Exam Vitals signs and nursing note reviewed.  Constitutional:      General: He is not in acute distress.    Appearance: He is well-developed.  HENT:     Head: Normocephalic and atraumatic.     Nose: Congestion present.     Comments: No maxillary sinus tenderness    Mouth/Throat:     Pharynx: No oropharyngeal exudate.  Eyes:     Conjunctiva/sclera: Conjunctivae normal.     Pupils: Pupils are equal, round, and reactive to light.  Neck:     Musculoskeletal: Normal range of motion and neck supple.     Comments: No meningismus. Cardiovascular:     Rate and Rhythm: Normal rate and regular rhythm.     Heart sounds: Normal heart sounds. No murmur.     Comments: Equal radial pulses and grip strengths. Pulmonary:     Effort: Pulmonary effort is normal. No respiratory distress.     Breath sounds: No wheezing.     Comments: Diminished breath sounds bilaterally Abdominal:     Palpations: Abdomen is soft.     Tenderness: There is no abdominal tenderness. There is no guarding or rebound.  Musculoskeletal: Normal range of motion.        General: No tenderness.  Skin:    General: Skin is warm.  Neurological:     General: No focal deficit present.     Mental Status: He is alert and oriented to person, place, and time. Mental status is at baseline.     Cranial Nerves: No cranial nerve deficit.     Motor: No abnormal muscle tone.     Coordination: Coordination normal.     Comments: No ataxia on finger to nose bilaterally. No pronator  drift. 5/5 strength throughout. CN 2-12 intact.Equal grip strength. Sensation intact.   Psychiatric:        Behavior: Behavior normal.      ED Treatments / Results  Labs (all labs ordered are listed, but  only abnormal results are displayed) Labs Reviewed  BASIC METABOLIC PANEL - Abnormal; Notable for the following components:      Result Value   Potassium 3.3 (*)    Glucose, Bld 247 (*)    Calcium 8.7 (*)    All other components within normal limits  CBC WITH DIFFERENTIAL/PLATELET  BRAIN NATRIURETIC PEPTIDE  TROPONIN I  TROPONIN I    EKG EKG Interpretation  Date/Time:  Saturday November 16 2018 03:51:17 EST Ventricular Rate:  75 PR Interval:    QRS Duration: 114 QT Interval:  411 QTC Calculation: 460 R Axis:   -45 Text Interpretation:  Sinus rhythm Incomplete left bundle branch block Anterior Q waves, possibly due to ILBBB Nonspecific T wave abnormality inferior and lateral leads Confirmed by Glynn Octave [phone]) on 11/16/2018 4:04:00 AM   Radiology Dg Chest 2 View  Result Date: 11/16/2018 CLINICAL DATA:  Acute onset of cough and congestion. Generalized chest pain. EXAM: CHEST - 2 VIEW COMPARISON:  CTA of the chest performed 02/01/2018, and chest radiograph performed 03/06/2017 FINDINGS: The lungs are well-aerated and clear. There is no evidence of focal opacification, pleural effusion or pneumothorax. The heart is normal in size; the mediastinal contour is within normal limits. No acute osseous abnormalities are seen. IMPRESSION: No acute cardiopulmonary process seen. Electronically Signed   By: Roanna Raider M.D.   On: 11/16/2018 04:55   Ct Angio Chest Aorta W And/or Wo Contrast  Result Date: 11/16/2018 CLINICAL DATA:  Cough and chest congestion over the last week. Chest pain and pressure. History of thoracic aneurysm. EXAM: CT ANGIOGRAPHY CHEST WITH CONTRAST TECHNIQUE: Multidetector CT imaging of the chest was performed using the standard protocol during bolus administration of intravenous contrast. Multiplanar CT image reconstructions and MIPs were obtained to evaluate the vascular anatomy. CONTRAST:  ISOVUE-370 IOPAMIDOL (ISOVUE-370) INJECTION 76% COMPARISON:  Chest  radiography same day chest CT 02/01/2018. FINDINGS: Cardiovascular: Heart size is normal. No visible coronary artery calcification atherosclerosis of the aorta with atherosclerotic calcification most notable at the arch and descending aorta. Aortic measurements not significantly changed from the previous study. Descending thoracic aorta measurement 2.9 cm. Aortic root at the sinus of Valsalva measurement 3.8 cm and stable. Fusiform dilatation of the ascending aorta 4.1 cm and stable. Proximal aortic arch 3.6 cm and stable. Mediastinum/Nodes: No mass or lymphadenopathy. Lungs/Pleura: No pulmonary infiltrate or collapse. No mass lesion. Two tiny right middle lobe nodules previously seen are no longer visible. Upper Abdomen: Negative Musculoskeletal: Mild curvature in degenerative changes of the thoracic spine. Review of the MIP images confirms the above findings. IMPRESSION: Stable atherosclerotic changes of the aorta with maximal diameter of the ascending aorta 4.1 cm. Recommend annual imaging followup by CTA or MRA. This recommendation follows 2010 ACCF/AHA/AATS/ACR/ASA/SCA/SCAI/SIR/STS/SVM Guidelines for the Diagnosis and Management of Patients with Thoracic Aortic Disease. Circulation. 2010; 121: [id number] Electronically Signed   By: Paulina Fusi M.D.   On: 11/16/2018 06:58    Procedures Procedures (including critical care time)  Medications Ordered in ED Medications - No data to display   Initial Impression / Assessment and Plan / ED Course  I have reviewed the triage vital signs and the nursing notes.  Pertinent labs & imaging results that were available during my care  of the patient were reviewed by me and considered in my medical decision making (see chart for details).    1 week of cough, congestion and "flu".  Progressively worsening central chest pain for the past several days.  EKG shows anterior Q waves with new inferior lateral T wave changes  Chest x-ray is negative.  EKG with  nonspecific T wave inversions as above.  Troponin negative x1.  On further description of chest pain, patient states that his "congestion pain" only when he coughs and only lasts a few seconds at a time.  EKG reviewed with cardiology fellow Dr. Clarnce FlockFudim.  He agrees patient's description of pain is atypical for ACS or angina however he does have new T wave inversions inferior laterally.  Dr. Clarnce FlockFudim recommends patient come to [facility] today for a stress test. EKG today does appear similar to July 2017.  CT scan shows stable thoracic aneurysm.  Patient states he has to take his sister to the cancer center this morning and he is unwilling to stay. Patient also declined second troponin.  States he needs to leave now to take his sister to her appointment.  Discussed with patient that a potential cardiac etiology of his pain has not been ruled out and he will be leaving AGAINST MEDICAL ADVICE.  He appears to have capacity to make this decision.  He is requesting "something for this cold and flu".  Discussed that there is no evidence of pneumonia.  However since he has been coughing for the past week and does have a history of smoking will place him on antibiotics for bronchitis.  Discussed PCP follow-up and follow-up with his cardiologist for stress test.  Return to the ED if he wishes to be reevaluated or reconsiders  the recommendation for admission.  Patient requesting to leave against medical advice. He is alert and oriented x3 and appears to have decision making capacity. Denies suicidal or homicidal ideation.  Discussed that admission and further testing is recommended and emergent medical conditions have not been ruled out. Discussed that leaving against medical advice may result in clinical deterioration and possible death.  Final Clinical Impressions(s) / ED Diagnoses   Final diagnoses:  Cough  Atypical chest pain  Abnormal EKG    ED Discharge Orders    None       Damion Kant, Jeannett SeniorStephen, MD 11/16/18  [phone]

## 2018-11-16 NOTE — ED Notes (Signed)
Pt states he would like to "change the description of my chest pain." Pt states the pain feels like a "congestion when I cough."

## 2018-11-16 NOTE — Discharge Instructions (Addendum)
As we discussed, your Xray shows no pneumonia. Your EKG has some abnormality and the cardiologist is recommending another stress test. You declined the second heart enzyme test. You are leaving against medical advice and should followup with your cardiologist for a stress test. Return to the ED if you wish to be reevaluated.

## 2018-11-16 NOTE — ED Triage Notes (Signed)
Pt states he feels like "someone is standing on his chest." Pt states that this has been getting worse since last week. Pt also C/O cough and congestion.

## 2018-11-26 ENCOUNTER — Emergency Department (HOSPITAL_COMMUNITY): Payer: Medicaid Other

## 2018-11-26 ENCOUNTER — Observation Stay (HOSPITAL_COMMUNITY)
Admission: EM | Admit: 2018-11-26 | Discharge: 2018-11-27 | Disposition: A | Discharge status: Home / Self Care | Payer: Medicaid Other | Attending: Internal Medicine | Admitting: Internal Medicine

## 2018-11-26 ENCOUNTER — Observation Stay (HOSPITAL_BASED_OUTPATIENT_CLINIC_OR_DEPARTMENT_OTHER): Payer: Medicaid Other

## 2018-11-26 ENCOUNTER — Encounter (HOSPITAL_COMMUNITY): Payer: Self-pay | Admitting: Emergency Medicine

## 2018-11-26 ENCOUNTER — Other Ambulatory Visit: Payer: Self-pay

## 2018-11-26 DIAGNOSIS — N182 Chronic kidney disease, stage 2 (mild): Secondary | ICD-10-CM | POA: Diagnosis not present

## 2018-11-26 DIAGNOSIS — F329 Major depressive disorder, single episode, unspecified: Secondary | ICD-10-CM | POA: Diagnosis not present

## 2018-11-26 DIAGNOSIS — K219 Gastro-esophageal reflux disease without esophagitis: Secondary | ICD-10-CM | POA: Diagnosis not present

## 2018-11-26 DIAGNOSIS — Z794 Long term (current) use of insulin: Secondary | ICD-10-CM | POA: Insufficient documentation

## 2018-11-26 DIAGNOSIS — I252 Old myocardial infarction: Secondary | ICD-10-CM | POA: Diagnosis not present

## 2018-11-26 DIAGNOSIS — E1122 Type 2 diabetes mellitus with diabetic chronic kidney disease: Secondary | ICD-10-CM | POA: Insufficient documentation

## 2018-11-26 DIAGNOSIS — E785 Hyperlipidemia, unspecified: Secondary | ICD-10-CM | POA: Insufficient documentation

## 2018-11-26 DIAGNOSIS — Z87891 Personal history of nicotine dependence: Secondary | ICD-10-CM | POA: Diagnosis not present

## 2018-11-26 DIAGNOSIS — Z791 Long term (current) use of non-steroidal anti-inflammatories (NSAID): Secondary | ICD-10-CM | POA: Insufficient documentation

## 2018-11-26 DIAGNOSIS — I10 Essential (primary) hypertension: Secondary | ICD-10-CM

## 2018-11-26 DIAGNOSIS — I5032 Chronic diastolic (congestive) heart failure: Secondary | ICD-10-CM | POA: Insufficient documentation

## 2018-11-26 DIAGNOSIS — Z23 Encounter for immunization: Secondary | ICD-10-CM | POA: Diagnosis not present

## 2018-11-26 DIAGNOSIS — I25119 Atherosclerotic heart disease of native coronary artery with unspecified angina pectoris: Secondary | ICD-10-CM

## 2018-11-26 DIAGNOSIS — Z7982 Long term (current) use of aspirin: Secondary | ICD-10-CM | POA: Insufficient documentation

## 2018-11-26 DIAGNOSIS — I130 Hypertensive heart and chronic kidney disease with heart failure and stage 1 through stage 4 chronic kidney disease, or unspecified chronic kidney disease: Secondary | ICD-10-CM | POA: Insufficient documentation

## 2018-11-26 DIAGNOSIS — R0789 Other chest pain: Secondary | ICD-10-CM | POA: Diagnosis not present

## 2018-11-26 DIAGNOSIS — R079 Chest pain, unspecified: Secondary | ICD-10-CM | POA: Diagnosis present

## 2018-11-26 DIAGNOSIS — I712 Thoracic aortic aneurysm, without rupture: Secondary | ICD-10-CM | POA: Diagnosis not present

## 2018-11-26 DIAGNOSIS — Z79899 Other long term (current) drug therapy: Secondary | ICD-10-CM | POA: Insufficient documentation

## 2018-11-26 DIAGNOSIS — I2510 Atherosclerotic heart disease of native coronary artery without angina pectoris: Secondary | ICD-10-CM | POA: Insufficient documentation

## 2018-11-26 DIAGNOSIS — R072 Precordial pain: Secondary | ICD-10-CM

## 2018-11-26 DIAGNOSIS — E119 Type 2 diabetes mellitus without complications: Secondary | ICD-10-CM

## 2018-11-26 DIAGNOSIS — F419 Anxiety disorder, unspecified: Secondary | ICD-10-CM | POA: Diagnosis not present

## 2018-11-26 DIAGNOSIS — M25511 Pain in right shoulder: Secondary | ICD-10-CM | POA: Diagnosis present

## 2018-11-26 LAB — BASIC METABOLIC PANEL
Anion gap: 9 (ref 5–15)
BUN: 19 mg/dL (ref 8–23)
CO2: 27 mmol/L (ref 22–32)
Calcium: 8.9 mg/dL (ref 8.9–10.3)
Chloride: 101 mmol/L (ref 98–111)
Creatinine, Ser: 1.12 mg/dL (ref 0.61–1.24)
GFR calc Af Amer: >60 mL/min (ref >60)
GFR calc non Af Amer: >60 mL/min (ref >60)
Glucose, Bld: 232 mg/dL — ABNORMAL HIGH (ref 70–99)
Potassium: 3.6 mmol/L (ref 3.5–5.1)
Sodium: 137 mmol/L (ref 135–145)

## 2018-11-26 LAB — GLUCOSE, CAPILLARY: Glucose-Capillary: 320 mg/dL — ABNORMAL HIGH (ref 70–99)

## 2018-11-26 LAB — CBG MONITORING, ED
Glucose-Capillary: 204 mg/dL — ABNORMAL HIGH (ref 70–99)
Glucose-Capillary: 269 mg/dL — ABNORMAL HIGH (ref 70–99)
Glucose-Capillary: 196 mg/dL — ABNORMAL HIGH (ref 70–99)

## 2018-11-26 LAB — CBC
HCT: 42.3 % (ref 39.0–52.0)
Hemoglobin: 13.8 g/dL (ref 13.0–17.0)
MCH: 28.8 pg (ref 26.0–34.0)
MCHC: 32.6 g/dL (ref 30.0–36.0)
MCV: 88.1 fL (ref 80.0–100.0)
Platelets: 372 10*3/uL (ref 150–400)
RBC: 4.8 MIL/uL (ref 4.22–5.81)
RDW: 13 % (ref 11.5–15.5)
WBC: 7.9 10*3/uL (ref 4.0–10.5)
nRBC: 0 % (ref 0.0–0.2)

## 2018-11-26 LAB — TSH: TSH: 3.313 u[IU]/mL (ref 0.350–4.500)

## 2018-11-26 LAB — TROPONIN I
Troponin I: <0.03 ng/mL (ref <0.03)
Troponin I: <0.03 ng/mL (ref <0.03)
Troponin I: <0.03 ng/mL (ref <0.03)

## 2018-11-26 LAB — ECHOCARDIOGRAM COMPLETE
Height: 70 in
Weight: 3920 oz

## 2018-11-26 LAB — I-STAT TROPONIN, ED: Troponin i, poc: 0.01 ng/mL (ref 0.00–0.08)

## 2018-11-26 LAB — HEMOGLOBIN A1C
Hgb A1c MFr Bld: 10.4 % — ABNORMAL HIGH (ref 4.8–5.6)
Mean Plasma Glucose: 251.78 mg/dL

## 2018-11-26 MED ORDER — LISINOPRIL 10 MG PO TABS
20.0000 mg | ORAL_TABLET | Freq: Every day | ORAL | Status: DC
  Administered 2018-11-26 – 2018-11-27 (×2): 20 mg via ORAL
  Filled 2018-11-26 (×3): qty 2

## 2018-11-26 MED ORDER — HYDROXYZINE HCL 25 MG PO TABS
25.0000 mg | ORAL_TABLET | Freq: Two times a day (BID) | ORAL | Status: DC | PRN
  Administered 2018-11-26: 25 mg via ORAL

## 2018-11-26 MED ORDER — LORATADINE 10 MG PO TABS
10.0000 mg | ORAL_TABLET | Freq: Every day | ORAL | Status: DC
  Administered 2018-11-26 – 2018-11-27 (×2): 10 mg via ORAL
  Filled 2018-11-26 (×3): qty 1

## 2018-11-26 MED ORDER — NITROGLYCERIN 0.4 MG SL SUBL: 0.4000 mg | SUBLINGUAL_TABLET | SUBLINGUAL | Status: DC | PRN

## 2018-11-26 MED ORDER — BUSPIRONE HCL 5 MG PO TABS
10.0000 mg | ORAL_TABLET | Freq: Two times a day (BID) | ORAL | Status: DC
  Administered 2018-11-26 – 2018-11-27 (×2): 10 mg via ORAL
  Filled 2018-11-26 (×2): qty 2

## 2018-11-26 MED ORDER — ASPIRIN 81 MG PO CHEW
324.0000 mg | CHEWABLE_TABLET | Freq: Once | ORAL | Status: AC
  Administered 2018-11-26: 324 mg via ORAL
  Filled 2018-11-26: qty 4

## 2018-11-26 MED ORDER — INSULIN GLARGINE 100 UNIT/ML ~~LOC~~ SOLN
25.0000 [IU] | Freq: Two times a day (BID) | SUBCUTANEOUS | Status: DC
  Administered 2018-11-26 (×2): 25 [IU] via SUBCUTANEOUS
  Filled 2018-11-26 (×7): qty 0.25

## 2018-11-26 MED ORDER — PANTOPRAZOLE SODIUM 40 MG PO TBEC
40.0000 mg | DELAYED_RELEASE_TABLET | ORAL | Status: DC
  Administered 2018-11-26 – 2018-11-27 (×2): 40 mg via ORAL
  Filled 2018-11-26: qty 1

## 2018-11-26 MED ORDER — ASPIRIN EC 81 MG PO TBEC
81.0000 mg | DELAYED_RELEASE_TABLET | Freq: Every day | ORAL | Status: DC
  Administered 2018-11-27: 81 mg via ORAL
  Filled 2018-11-26: qty 1

## 2018-11-26 MED ORDER — INSULIN GLARGINE 100 UNIT/ML ~~LOC~~ SOLN
25.0000 [IU] | Freq: Every day | SUBCUTANEOUS | Status: DC
  Filled 2018-11-26 (×2): qty 0.25

## 2018-11-26 MED ORDER — ACEBUTOLOL HCL 200 MG PO CAPS
200.0000 mg | ORAL_CAPSULE | Freq: Two times a day (BID) | ORAL | Status: DC
  Administered 2018-11-26 – 2018-11-27 (×2): 200 mg via ORAL
  Filled 2018-11-26 (×8): qty 1

## 2018-11-26 MED ORDER — ACETAMINOPHEN 325 MG PO TABS
650.0000 mg | ORAL_TABLET | ORAL | Status: DC | PRN
  Administered 2018-11-26: 650 mg via ORAL
  Filled 2018-11-26: qty 2

## 2018-11-26 MED ORDER — AMLODIPINE BESYLATE 5 MG PO TABS
10.0000 mg | ORAL_TABLET | Freq: Every day | ORAL | Status: DC
  Administered 2018-11-26 – 2018-11-27 (×2): 10 mg via ORAL
  Filled 2018-11-26 (×2): qty 2

## 2018-11-26 MED ORDER — ASPIRIN EC 325 MG PO TBEC: 325.0000 mg | DELAYED_RELEASE_TABLET | Freq: Every day | ORAL | Status: DC

## 2018-11-26 MED ORDER — CITALOPRAM HYDROBROMIDE 20 MG PO TABS
20.0000 mg | ORAL_TABLET | ORAL | Status: DC
  Administered 2018-11-26 – 2018-11-27 (×2): 20 mg via ORAL
  Filled 2018-11-26 (×3): qty 1

## 2018-11-26 MED ORDER — ISOSORBIDE MONONITRATE ER 60 MG PO TB24
30.0000 mg | ORAL_TABLET | Freq: Every day | ORAL | Status: DC
  Administered 2018-11-26 – 2018-11-27 (×2): 30 mg via ORAL
  Filled 2018-11-26 (×3): qty 1

## 2018-11-26 MED ORDER — INSULIN ASPART 100 UNIT/ML ~~LOC~~ SOLN
0.0000 [IU] | Freq: Every day | SUBCUTANEOUS | Status: DC
  Administered 2018-11-26: 4 [IU] via SUBCUTANEOUS

## 2018-11-26 MED ORDER — HYDROXYZINE PAMOATE 25 MG PO CAPS
25.0000 mg | ORAL_CAPSULE | Freq: Two times a day (BID) | ORAL | Status: DC | PRN
  Filled 2018-11-26: qty 1

## 2018-11-26 MED ORDER — NITROGLYCERIN 0.4 MG SL SUBL
0.4000 mg | SUBLINGUAL_TABLET | SUBLINGUAL | Status: DC | PRN
  Filled 2018-11-26: qty 1

## 2018-11-26 MED ORDER — ALUM & MAG HYDROXIDE-SIMETH 200-200-20 MG/5ML PO SUSP: 30.0000 mL | Freq: Four times a day (QID) | ORAL | Status: DC | PRN

## 2018-11-26 MED ORDER — SPIRONOLACTONE 25 MG PO TABS
25.0000 mg | ORAL_TABLET | Freq: Every day | ORAL | Status: DC
  Administered 2018-11-26 – 2018-11-27 (×2): 25 mg via ORAL
  Filled 2018-11-26 (×3): qty 1

## 2018-11-26 MED ORDER — PNEUMOCOCCAL VAC POLYVALENT 25 MCG/0.5ML IJ INJ
0.5000 mL | INJECTION | INTRAMUSCULAR | Status: AC
  Administered 2018-11-27: 0.5 mL via INTRAMUSCULAR
  Filled 2018-11-26: qty 0.5

## 2018-11-26 MED ORDER — FERROUS SULFATE 325 (65 FE) MG PO TABS
325.0000 mg | ORAL_TABLET | Freq: Every day | ORAL | Status: DC
  Administered 2018-11-26: 325 mg via ORAL
  Filled 2018-11-26 (×2): qty 1

## 2018-11-26 MED ORDER — HEPARIN SODIUM (PORCINE) 5000 UNIT/ML IJ SOLN
5000.0000 [IU] | Freq: Three times a day (TID) | INTRAMUSCULAR | Status: DC
  Administered 2018-11-26 (×2): 5000 [IU] via SUBCUTANEOUS
  Filled 2018-11-26 (×3): qty 1

## 2018-11-26 MED ORDER — INSULIN ASPART 100 UNIT/ML ~~LOC~~ SOLN
0.0000 [IU] | Freq: Three times a day (TID) | SUBCUTANEOUS | Status: DC
  Administered 2018-11-26: 3 [IU] via SUBCUTANEOUS
  Administered 2018-11-26: 5 [IU] via SUBCUTANEOUS
  Administered 2018-11-26: 3 [IU] via SUBCUTANEOUS
  Administered 2018-11-27: 8 [IU] via SUBCUTANEOUS
  Administered 2018-11-27: 15 [IU] via SUBCUTANEOUS
  Filled 2018-11-26 (×2): qty 1

## 2018-11-26 MED ORDER — ONDANSETRON HCL 4 MG/2ML IJ SOLN: 4.0000 mg | Freq: Four times a day (QID) | INTRAMUSCULAR | Status: DC | PRN

## 2018-11-26 MED ORDER — ATORVASTATIN CALCIUM 40 MG PO TABS
40.0000 mg | ORAL_TABLET | Freq: Every evening | ORAL | Status: DC
  Administered 2018-11-26: 40 mg via ORAL
  Filled 2018-11-26 (×2): qty 1

## 2018-11-26 NOTE — ED Provider Notes (Signed)
[facility] EMERGENCY DEPARTMENT Provider Note   CSN: [id number] Arrival date & time: 11/26/18  0602     History   Chief Complaint Chief Complaint  Patient presents with  . Chest Pain    HPI Matthew Cain is a 63 y.o. male.  HPI  63 year old male presents with chest pain.  He states that first occurred 2 days ago and he took nitroglycerin and it went away.  Recurred this morning around 2 AM and it woke him up.  Feels both pressure and when it severe it is a sharp pain.  He took 2 nitroglycerin this morning and the pain went away and he went back to sleep.  Woke up again around 4 AM with pain.  The chest pain is not as bad currently and is about a 6 out of 10.  Occasionally will become sharp.  Does have some radiation to his right shoulder.  He has had some vague left low back pain over the last couple days as well but the pain is not going from his chest to his back or described as severe or tearing.  No leg swelling.  A little bit of dyspnea.  Past Medical History:  Diagnosis Date  . Amputation finger    left ring finger  . Anxiety   . CAD (coronary artery disease)    LHC 10/12:  mild plaque in the LAD but no obstructive CAD  . CHF (congestive heart failure) (HCC)   . Chronic diastolic heart failure (HCC)    EF 55-60%, grade 1 diastolic dysfunction; no WMAs, echo, 11/2011; s/p pulmo. edema c/b VDRF 11/12; Echo 09/08/11: severe LVH, no SAM, no LVOT gradient, EF 55%, mild BAE, mild reduced RVSF.  [name] CKD (chronic kidney disease)   . Constipation    "due to medication"  . Depression   . Diabetes mellitus    A1C 7.3 08/2011  . GERD (gastroesophageal reflux disease)   . History of kidney stones   . History of right ACA stroke    10/12  --  MRI 09/13/11:  Acute right ACA infarct involving corpus callosum.  MRA 09/13/11: severe intracranial ASD, right ACA occluded, left PCA and right MCA with severe disease.  Dopplers were neg for significant ICA stenosis  . History of transfusion   .  HLD (hyperlipidemia)   . Hypertension   . Myocardial infarction (HCC) 07/2011  . Renal insufficiency   . Shortness of breath dyspnea    with exertion  . Spermatocele   . Thoracic aortic aneurysm (HCC)    CT in 10/12: 4.4 cm ascending thoracic aortic aneurysm  . Tongue tied    "at times I get tongue tied since the stroke"    Patient Active Problem List   Diagnosis Date Noted  . CKD stage 2 due to type 2 diabetes mellitus (HCC) 08/26/2016  . Chest pain 08/25/2016  . Hydrocele, left 07/21/2015  . 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 Palpitations 03/13/2012  . Panic disorder 02/13/2012  . Thoracic aortic aneurysm (HCC) 11/23/2011  . Chronic diastolic heart failure (HCC)   . CVA (cerebral infarction) 09/17/2011  . CAD (coronary artery disease), native coronary artery 09/17/2011  . HTN (hypertension) 09/17/2011  . Dyslipidemia 09/17/2011  . DM (diabetes mellitus) (HCC) 09/17/2011  . Renal insufficiency 09/17/2011  . Pneumonia 09/17/2011  . Myocardial infarction (HCC) 07/15/2011    Past Surgical History:  Procedure Laterality Date  .  vasectomy     bilateral  . CARDIAC CATHETERIZATION  09/09/11  . ROTATOR CUFF REPAIR  07/2015   LEFT  . SPERMATOCELECTOMY  1990's   RT side  . SPERMATOCELECTOMY Left 07/20/2015   Procedure: LEFT HYDROCELECTOMY;  Surgeon: Bjorn Pippin, MD;  Location: [facility];  Service: Urology;  Laterality: Left;  . TRANSURETHRAL INCISION OF PROSTATE N/A 07/20/2015   Procedure: TRANSURETHRAL INCISION OF THE PROSTATE (TUIP);  Surgeon: Bjorn Pippin, MD;  Location: [facility];  Service: Urology;  Laterality: N/A;        Home Medications    Prior to Admission medications   Medication Sig Start Date End Date Taking? Authorizing Provider  acebutolol (SECTRAL) 200 MG capsule Take 1 capsule (200 mg total) by mouth 2 (two) times daily. 11/01/17   Laqueta Linden, MD  acebutolol (SECTRAL) 200 MG capsule TAKE 1 CAPSULE BY MOUTH TWICE DAILY 04/29/18    Laqueta Linden, MD  amLODipine (NORVASC) 10 MG tablet Take 1 tablet (10 mg total) by mouth daily. Patient taking differently: Take 10 mg by mouth every morning.  09/18/12   Serpe, Clide Deutscher, PA-C  aspirin EC 81 MG tablet Take 81 mg by mouth daily.    [provider]  atorvastatin (LIPITOR) 40 MG tablet Take 1 tablet (40 mg total) by mouth every evening. 09/18/12   Serpe, Clide Deutscher, PA-C  benzonatate (TESSALON) 100 MG capsule Take 1 capsule (100 mg total) by mouth 3 (three) times daily as needed for cough. 11/16/18   Rancour, Jeannett Senior, MD  busPIRone (BUSPAR) 10 MG tablet Take 10 mg by mouth 2 (two) times daily.    [provider]  citalopram (CELEXA) 20 MG tablet Take 20 mg by mouth every morning.     [provider]  doxycycline (VIBRAMYCIN) 100 MG capsule Take 1 capsule (100 mg total) by mouth 2 (two) times daily. 11/16/18   Rancour, Jeannett Senior, MD  ferrous sulfate (IRON SUPPLEMENT) 325 (65 FE) MG tablet Take 325 mg by mouth at bedtime.     [provider]  furosemide (LASIX) 40 MG tablet TAKE 1 TABLET BY MOUTH TWICE DAILY 11/04/18   Laqueta Linden, MD  HYDROXYZINE PAMOATE PO Take 25 mg by mouth daily.    [provider]  ibuprofen (ADVIL,MOTRIN) 800 MG tablet Take 400-800 mg by mouth every 8 (eight) hours as needed for pain.     [provider]  Insulin Glargine (LANTUS SOLOSTAR) 100 UNIT/ML Solostar Pen Inject 40 Units into the skin daily at 10 pm.     [provider]  isosorbide mononitrate (IMDUR) 30 MG 24 hr tablet TAKE 1 TABLET BY MOUTH ONCE DAILY 06/06/18   Laqueta Linden, MD  lisinopril (PRINIVIL,ZESTRIL) 20 MG tablet Take 1 tablet (20 mg total) by mouth daily. 05/14/15   Laqueta Linden, MD  loratadine (CLARITIN) 10 MG tablet Take 10 mg by mouth daily.    [provider]  metFORMIN (GLUCOPHAGE) 1000 MG tablet Take 1,000 mg by mouth 2 (two) times daily.    [provider]  nitroGLYCERIN (NITROSTAT) 0.4  MG SL tablet Place 1 tablet (0.4 mg total) under the tongue every 5 (five) minutes as needed for chest pain. 09/25/16   Jodelle Gross, NP  pantoprazole (PROTONIX) 40 MG tablet Take 40 mg by mouth every morning.    [provider]  polyethylene glycol powder (GLYCOLAX/MIRALAX) powder Take 17 g by mouth daily.    [provider]  spironolactone (ALDACTONE) 25 MG tablet TAKE 1 TABLET BY MOUTH ONCE DAILY 09/25/18   Purvis Sheffield,  Ottie GlazierSuresh A, MD    Family History Family History  Problem Relation Age of Onset  . Heart attack Mother   . Cerebral aneurysm Father   . Cerebral aneurysm Other   . Diabetes Other   . Hypertension Other     Social History Social History   Tobacco Use  . Smoking status: Former Smoker    Packs/day: 1.00    Years: 30.00    Pack years: 30.00    Types: Cigarettes    Last attempt to quit: 11/13/1994    Years since quitting: 24.0  . Smokeless tobacco: Never Used  Substance Use Topics  . Alcohol use: No  . Drug use: No     Allergies   Patient has no known allergies.   Review of Systems Review of Systems  Respiratory: Positive for shortness of breath.   Cardiovascular: Positive for chest pain.  Gastrointestinal: Positive for nausea. Negative for abdominal pain and vomiting.  Musculoskeletal: Positive for back pain.  All other systems reviewed and are negative.    Physical Exam Updated Vital Signs BP (!) 170/99 (BP Location: Right Arm)   Pulse 80   Temp 98.5 F (36.9 C) (Oral)   Resp 19   Ht 5\' 10"  (1.778 m)   Wt 111.1 kg   SpO2 98%   BMI 35.15 kg/m   Physical Exam Vitals signs and nursing note reviewed.  Constitutional:      General: He is not in acute distress.    Appearance: He is well-developed. He is obese. He is not ill-appearing or diaphoretic.  HENT:     Head: Normocephalic and atraumatic.     Right Ear: External ear normal.     Left Ear: External ear normal.     Nose: Nose normal.  Eyes:     General:         Right eye: No discharge.        Left eye: No discharge.  Neck:     Musculoskeletal: Neck supple.  Cardiovascular:     Rate and Rhythm: Normal rate and regular rhythm.     Pulses:          Radial pulses are 2+ on the right side and 2+ on the left side.     Heart sounds: Normal heart sounds.  Pulmonary:     Effort: Pulmonary effort is normal.     Breath sounds: Normal breath sounds.  Abdominal:     Palpations: Abdomen is soft.     Tenderness: There is no abdominal tenderness.  Musculoskeletal:     Right lower leg: No edema.     Left lower leg: No edema.  Skin:    General: Skin is warm and dry.  Neurological:     Mental Status: He is alert.  Psychiatric:        Mood and Affect: Mood is not anxious.      ED Treatments / Results  Labs (all labs ordered are listed, but only abnormal results are displayed) Labs Reviewed  BASIC METABOLIC PANEL - Abnormal; Notable for the following components:      Result Value   Glucose, Bld 232 (*)    All other components within normal limits  CBC  I-STAT TROPONIN, ED    EKG EKG Interpretation  Date/Time:  Tuesday November 26 2018 06:13:05 EST Ventricular Rate:  81 PR Interval:    QRS Duration: 116 QT Interval:  401 QTC Calculation: 466 R Axis:   -52 Text Interpretation:  Sinus rhythm Incomplete left  bundle branch block Left ventricular hypertrophy Confirmed by Zadie Rhine ([id number]) on 11/26/2018 6:17:12 AM   Radiology Dg Chest 2 View  Result Date: 11/26/2018 CLINICAL DATA:  Chest pain that started last night EXAM: CHEST - 2 VIEW COMPARISON:  11/16/2018 chest CT FINDINGS: Normal heart size and stable mediastinal contours. Low volume chest with mild interstitial crowding. There is no edema, consolidation, effusion, or pneumothorax. Artifact from EKG leads. IMPRESSION: No evidence of active disease. Electronically Signed   By: Marnee Spring M.D.   On: 11/26/2018 06:43    Procedures Procedures (including critical care  time)  Medications Ordered in ED Medications  aspirin chewable tablet 324 mg (has no administration in time range)  nitroGLYCERIN (NITROSTAT) SL tablet 0.4 mg (has no administration in time range)     Initial Impression / Assessment and Plan / ED Course  I have reviewed the triage vital signs and the nursing notes.  Pertinent labs & imaging results that were available during my care of the patient were reviewed by me and considered in my medical decision making (see chart for details).     Patient had some nonobstructive CAD on a cath about 8 years ago.  With his risk factors, he is higher risk for heart disease and I am concerned that this is coronary in origin.  His ECG does not show any acute ischemia but with his history he will need admission.  He has a history of a stable aortic aneurysm in the thoracic aorta but this was also just imaged on 1/4 and was stable.  My suspicion for dissection is much less.  I discussed with Dr. Gwenlyn Perking who will admit.    Final Clinical Impressions(s) / ED Diagnoses   Final diagnoses:  Chest pain at rest    ED Discharge Orders    None       Pricilla Loveless, MD 11/26/18 [phone]

## 2018-11-26 NOTE — ED Triage Notes (Signed)
Pt c/o center chest pain x 2 days, right shoulder pain and back pain, reports he took 2 nitro (1 at 0100 and 0300) this morning, pt reports temporary relief but chest pain has returned

## 2018-11-26 NOTE — Progress Notes (Signed)
*  PRELIMINARY RESULTS* Echocardiogram 2D Echocardiogram has been performed.  Stacey Drain 11/26/2018, 3:06 PM

## 2018-11-26 NOTE — H&P (Signed)
History and Physical    Sherre PootLacy Awadallah ZOX:[id number] DOB: [birth year] DOA: 11/26/2018  Referring MD/NP/PA: Dr. Criss AlvineGoldston PCP: Tylene FantasiaMuse, Rochelle D., PA-C  Patient coming from: Home  Chief Complaint: Chest pain  HPI: 63 year old male with PMH of hyperlipidemia, hypertension, hx of non-obstructive CAD,  chronic diastolic CHF, thoracic aorta aneurysm (4.1 cm by imaging in 11/2018), CKD, DM type 2, anxiety and depression; who came to the ED due to a stabbing, 9-10/10 chest pain (for 2 weeks approximated) that radiates to his right shoulder and gets worst with exertion. Episode happened this morning around 2AM waking him up from his sleep and responded to nitroglycerin. However, chest discomfort came back at 4AM and at that time he decided to come to the hospital. Patient states having headaches, reflux symptoms, dark urine (yesterday was the only and last time), constipation and left lower back pain that he does not associate with his current condition.   Denies fevers, diaphoresis, dyspnea nausea or vomiting; denies also any changes in appetite, hemoptysis, abdominal pain, dysuria, hematochezia and melena.  Initial troponin value negative. CXR shows no active cardiopulmonary disease. TWI along inferior and lateral leads improved in contrast to prior tracings. Cardiology has been consulted, troponin and antihypertensive agents/ASA given in ED. TRH consulted to brin into the hospital for ACS r/o. Heart score 5.  Past Medical/Surgical History: Past Medical History:  Diagnosis Date  . Amputation finger    left ring finger  . Anxiety   . CAD (coronary artery disease)    LHC 10/12:  mild plaque in the LAD but no obstructive CAD  . CHF (congestive heart failure) (HCC)   . Chronic diastolic heart failure (HCC)    EF 55-60%, grade 1 diastolic dysfunction; no WMAs, echo, 11/2011; s/p pulmo. edema c/b VDRF 11/12; Echo 09/08/11: severe LVH, no SAM, no LVOT gradient, EF 55%, mild BAE, mild reduced RVSF.  [name]. CKD  (chronic kidney disease)   . Constipation    "due to medication"  . Depression   . Diabetes mellitus    A1C 7.3 08/2011  . GERD (gastroesophageal reflux disease)   . History of kidney stones   . History of right ACA stroke    10/12  --  MRI 09/13/11:  Acute right ACA infarct involving corpus callosum.  MRA 09/13/11: severe intracranial ASD, right ACA occluded, left PCA and right MCA with severe disease.  Dopplers were neg for significant ICA stenosis  . History of transfusion   . HLD (hyperlipidemia)   . Hypertension   . Myocardial infarction (HCC) 07/2011  . Renal insufficiency   . Shortness of breath dyspnea    with exertion  . Spermatocele   . Thoracic aortic aneurysm (HCC)    CT in 10/12: 4.4 cm ascending thoracic aortic aneurysm  . Tongue tied    "at times I get tongue tied since the stroke"    Past Surgical History:  Procedure Laterality Date  .  vasectomy     bilateral  . CARDIAC CATHETERIZATION  09/09/11  . ROTATOR CUFF REPAIR  07/2015   LEFT  . SPERMATOCELECTOMY  1990's   RT side  . SPERMATOCELECTOMY Left 07/20/2015   Procedure: LEFT HYDROCELECTOMY;  Surgeon: Bjorn PippinJohn Wrenn, MD;  Location: [facility];  Service: Urology;  Laterality: Left;  . TRANSURETHRAL INCISION OF PROSTATE N/A 07/20/2015   Procedure: TRANSURETHRAL INCISION OF THE PROSTATE (TUIP);  Surgeon: Bjorn PippinJohn Wrenn, MD;  Location: [facility];  Service: Urology;  Laterality: N/A;    Social History:  reports  that he quit smoking about 24 years ago. His smoking use included cigarettes. He has a 30.00 pack-year smoking history. He has never used smokeless tobacco. He reports that he does not drink alcohol or use drugs.  Allergies: No Known Allergies  Family History:  Family History  Problem Relation Age of Onset  . Heart attack Mother   . Cerebral aneurysm Father   . Cerebral aneurysm Other   . Diabetes Other   . Hypertension Other     Prior to Admission medications   Medication Sig Start Date End Date Taking?  Authorizing Provider  acebutolol (SECTRAL) 200 MG capsule Take 1 capsule (200 mg total) by mouth 2 (two) times daily. 11/01/17   Laqueta LindenKoneswaran, Suresh A, MD  acebutolol (SECTRAL) 200 MG capsule TAKE 1 CAPSULE BY MOUTH TWICE DAILY 04/29/18   Laqueta LindenKoneswaran, Suresh A, MD  amLODipine (NORVASC) 10 MG tablet Take 1 tablet (10 mg total) by mouth daily. Patient taking differently: Take 10 mg by mouth every morning.  09/18/12   Serpe, Clide DeutscherEugene C, PA-C  aspirin EC 81 MG tablet Take 81 mg by mouth daily.    [provider]  atorvastatin (LIPITOR) 40 MG tablet Take 1 tablet (40 mg total) by mouth every evening. 09/18/12   Serpe, Clide DeutscherEugene C, PA-C  benzonatate (TESSALON) 100 MG capsule Take 1 capsule (100 mg total) by mouth 3 (three) times daily as needed for cough. 11/16/18   Rancour, Jeannett SeniorStephen, MD  busPIRone (BUSPAR) 10 MG tablet Take 10 mg by mouth 2 (two) times daily.    [provider]  citalopram (CELEXA) 20 MG tablet Take 20 mg by mouth every morning.     [provider]  ferrous sulfate (IRON SUPPLEMENT) 325 (65 FE) MG tablet Take 325 mg by mouth at bedtime.     [provider]  furosemide (LASIX) 40 MG tablet TAKE 1 TABLET BY MOUTH TWICE DAILY 11/04/18   Laqueta LindenKoneswaran, Suresh A, MD  HYDROXYZINE PAMOATE PO Take 25 mg by mouth daily.    [provider]  ibuprofen (ADVIL,MOTRIN) 800 MG tablet Take 400-800 mg by mouth every 8 (eight) hours as needed for pain.     [provider]  Insulin Glargine (LANTUS SOLOSTAR) 100 UNIT/ML Solostar Pen Inject 40 Units into the skin daily at 10 pm.     [provider]  isosorbide mononitrate (IMDUR) 30 MG 24 hr tablet TAKE 1 TABLET BY MOUTH ONCE DAILY 06/06/18   Laqueta LindenKoneswaran, Suresh A, MD  lisinopril (PRINIVIL,ZESTRIL) 20 MG tablet Take 1 tablet (20 mg total) by mouth daily. 05/14/15   Laqueta LindenKoneswaran, Suresh A, MD  loratadine (CLARITIN) 10 MG tablet Take 10 mg by mouth daily.    [provider]  metFORMIN (GLUCOPHAGE) 1000 MG  tablet Take 1,000 mg by mouth 2 (two) times daily.    [provider]  nitroGLYCERIN (NITROSTAT) 0.4 MG SL tablet Place 1 tablet (0.4 mg total) under the tongue every 5 (five) minutes as needed for chest pain. 09/25/16   Jodelle GrossLawrence, Kathryn M, NP  pantoprazole (PROTONIX) 40 MG tablet Take 40 mg by mouth every morning.    [provider]  polyethylene glycol powder (GLYCOLAX/MIRALAX) powder Take 17 g by mouth daily.    [provider]  spironolactone (ALDACTONE) 25 MG tablet TAKE 1 TABLET BY MOUTH ONCE DAILY 09/25/18   Laqueta LindenKoneswaran, Suresh A, MD    Review of Systems:  Negative except for what stated above.  Physical Exam: Vitals:   11/26/18 0610 11/26/18 0612  BP:  [name](!)  170/99  Pulse:  80  Resp:  19  Temp:  98.5 F (36.9 C)  TempSrc:  Oral  SpO2:  98%  Weight: 111.1 kg   Height: 5\' 10"  (1.778 m)    Constitutional: Patient in no acute distress, calm, comfortable and currently denying chest pain. Eyes: PERRL, lids and conjunctivae normal, no icterus, no nystagmus. ENMT: Mucous membranes are moist. Posterior pharynx clear of any exudate or lesions.  Neck: normal, supple, no masses, no thyromegaly Respiratory: clear to auscultation bilaterally, no wheezing, no crackles. Normal respiratory effort. No accessory muscle use.  Cardiovascular: Regular rate and rhythm, no murmurs, rubs or gallops. No extremity edema. 2+ pedal pulses. Abdomen: obese abdomen with no tenderness, no masses palpated. No hepatosplenomegaly. Bowel sounds positive.  Musculoskeletal: no clubbing / cyanosis. No joint deformity upper and lower extremities. Good ROM and muscle tone.  Skin: No lower extremity edema, no rashes, lesions, ulcers. No induration.  Neurologic: cranial nerves intact. Sensation intact, DTR normal. Strength 5/5 in all 4.  Psychiatric: Normal judgment and insight. Alert and oriented x 3. Normal mood.   Labs on Admission: I have personally reviewed the following labs and imaging  studies  CBC: Recent Labs  Lab 11/26/18 0616  WBC 7.9  HGB 13.8  HCT 42.3  MCV 88.1  PLT 372   Basic Metabolic Panel: Recent Labs  Lab 11/26/18 0616  NA 137  K 3.6  CL 101  CO2 27  GLUCOSE 232*  BUN 19  CREATININE 1.12  CALCIUM 8.9   GFR: Estimated Creatinine Clearance: 84.2 mL/min (by C-G formula based on SCr of 1.12 mg/dL).   Urine analysis:    Component Value Date/Time   COLORURINE YELLOW 08/26/2016 1124   APPEARANCEUR CLEAR 08/26/2016 1124   LABSPEC 1.017 08/26/2016 1124   PHURINE 6.0 08/26/2016 1124   GLUCOSEU 500 (A) 08/26/2016 1124   HGBUR NEGATIVE 08/26/2016 1124   BILIRUBINUR NEGATIVE 08/26/2016 1124   KETONESUR NEGATIVE 08/26/2016 1124   PROTEINUR NEGATIVE 08/26/2016 1124   UROBILINOGEN 0.2 09/10/2011 1343   NITRITE NEGATIVE 08/26/2016 1124   LEUKOCYTESUR NEGATIVE 08/26/2016 1124   Radiological Exams on Admission: Dg Chest 2 View  Result Date: 11/26/2018 CLINICAL DATA:  Chest pain that started last night EXAM: CHEST - 2 VIEW COMPARISON:  11/16/2018 chest CT FINDINGS: Normal heart size and stable mediastinal contours. Low volume chest with mild interstitial crowding. There is no edema, consolidation, effusion, or pneumothorax. Artifact from EKG leads. IMPRESSION: No evidence of active disease. Electronically Signed   By: Marnee Spring M.D.   On: 11/26/2018 06:43    EKG: Independently reviewed.  No acute ischemic changes appreciated, normal sinus rhythm.  Normal QT.  Assessment/Plan  Chest Pain: -Placed on telemetry -Continue cycling troponin and EKG -2D echo has been ordered -Continue use of B-blocker, hydralazine and lysonopril -Continue aspirin -Consult cardiology for stress test/assistance with decision on further stratification procedure. -Nitrogliceryn as needed ordered. -Check TSH, lipid panel and glycosylated hemoglobin to complete stratification process. -Started on PPI.  Hypertension -Continue antihypertensive agents except for  lasix at this moment.  Hyperlipidemia -Continue statins -Check lipid profile as mentioned above.  Thoracic aortic aneurysm -Continue outpatient follow up. -Condition stable.  Diabetes Mellitus type 2 -Will hold hypoglycemic agents while inpatient -Continue SSI and lantus -Check A1c   DVT prophylaxis: Heparin Code Status: FULL Family Communication: no family members at bedside Disposition Plan: Anticipate discharge back home once ACS rule out. Consults called: Cardiology Admission status: Length of stay less than 2 midnights,  observation, telemetry bed.   Time Spent: 60 minutes.  Vassie Loll MD [facility] Pager [phone]  If 7PM-7AM, please contact night-coverage [email or web address]   11/26/2018, 7:52 AM

## 2018-11-26 NOTE — Consult Note (Addendum)
Cardiology Consult    Patient ID: Matthew Cain; [id number]; 06/04/1956   Admit date: 11/26/2018 Date of Consult: 11/26/2018  Primary Care Provider: Tylene Fantasia., PA-C Primary Cardiologist: Prentice Docker, MD   Patient Profile    Keifer Habib is a 63 y.o. male with past medical history of CAD (nonobstructive CAD by cath in 2012 with low-risk NST in 2017), chronic diastolic CHF, HTN, HLD, and thoracic aortic aneurysm (4.1 cm by imaging in 11/2018) who is being seen today for the evaluation of chest pain at the request of Dr. Gwenlyn Perking.   History of Present Illness    Mr. Riemenschneider was last examined by Dr. Purvis Sheffield in 01/2018 and reported an episode of chest discomfort approximately 2 weeks prior which had resolved spontaneously and denied any recurrent symptoms since. Was continued on his current medication regimen including ASA, Acebutolol, Imdur, Lipitor, Lasix, and Spironolactone with follow-up recommended in 1 year.  Was recently evaluated at [facility] ED on 11/16/2018 for chest pain. Reported having symptoms for over a week with associated congestion and productive cough. Initial troponin was negative but EKG showed new TWI along the inferior and lateral leads. It was recommended he stay for a delta troponin but he declined and left AMA due to having to transport family members to doctor's appointments.  He presented back to [facility] ED on 11/26/2018 for evaluation of recurrent chest pain. He reports having recovered from his recent cold but last night he was awoke from sleep due to a stabbing pain along his sternal region. He describes this as a sharp pain which resolved with the use of sublingual nitroglycerin. He went back to sleep but developed recurrent symptoms around 0400 which prompted him to come to the ED for further evaluation. He denies any associated nausea, vomiting, diaphoresis, or dyspnea. No recent orthopnea, PND, or lower extremity edema. He reports still having mild  discomfort along his sternum but this is significantly improved as compared to his presenting symptoms.   Initial labs show WBC 7.9, Hgb 13.8, platelets 372, Na+ 137, K+ 3.6, and creatinine 1.12. Initial and cyclic troponin values have been negative thus far. CXR shows no active cardiopulmonary disease. EKG shows NSR, H 81, with incomplete LBBB. TWI along inferior and lateral leads improved when compared to prior tracings.    Past Medical History:  Diagnosis Date  . Amputation finger    left ring finger  . Anxiety   . CAD (coronary artery disease)    LHC 10/12:  mild plaque in the LAD but no obstructive CAD  . CHF (congestive heart failure) (HCC)   . Chronic diastolic heart failure (HCC)    EF 55-60%, grade 1 diastolic dysfunction; no WMAs, echo, 11/2011; s/p pulmo. edema c/b VDRF 11/12; Echo 09/08/11: severe LVH, no SAM, no LVOT gradient, EF 55%, mild BAE, mild reduced RVSF.  [name] CKD (chronic kidney disease)   . Constipation    "due to medication"  . Depression   . Diabetes mellitus    A1C 7.3 08/2011  . GERD (gastroesophageal reflux disease)   . History of kidney stones   . History of right ACA stroke    10/12  --  MRI 09/13/11:  Acute right ACA infarct involving corpus callosum.  MRA 09/13/11: severe intracranial ASD, right ACA occluded, left PCA and right MCA with severe disease.  Dopplers were neg for significant ICA stenosis  . History of transfusion   . HLD (hyperlipidemia)   . Hypertension   . Myocardial  infarction (HCC) 07/2011  . Renal insufficiency   . Shortness of breath dyspnea    with exertion  . Spermatocele   . Thoracic aortic aneurysm (HCC)    CT in 10/12: 4.4 cm ascending thoracic aortic aneurysm  . Tongue tied    "at times I get tongue tied since the stroke"    Past Surgical History:  Procedure Laterality Date  .  vasectomy     bilateral  . CARDIAC CATHETERIZATION  09/09/11  . ROTATOR CUFF REPAIR  07/2015   LEFT  . SPERMATOCELECTOMY  1990's   RT side  .  SPERMATOCELECTOMY Left 07/20/2015   Procedure: LEFT HYDROCELECTOMY;  Surgeon: Bjorn Pippin, MD;  Location: [facility] ORS;  Service: Urology;  Laterality: Left;  . TRANSURETHRAL INCISION OF PROSTATE N/A 07/20/2015   Procedure: TRANSURETHRAL INCISION OF THE PROSTATE (TUIP);  Surgeon: Bjorn Pippin, MD;  Location: [facility] ORS;  Service: Urology;  Laterality: N/A;     Home Medications:  Prior to Admission medications   Medication Sig Start Date End Date Taking? Authorizing Provider  acebutolol (SECTRAL) 200 MG capsule TAKE 1 CAPSULE BY MOUTH TWICE DAILY 04/29/18  Yes Laqueta Linden, MD  amLODipine (NORVASC) 10 MG tablet Take 1 tablet (10 mg total) by mouth daily. Patient taking differently: Take 10 mg by mouth every morning.  09/18/12  Yes Serpe, Clide Deutscher, PA-C  aspirin EC 81 MG tablet Take 81 mg by mouth daily.   Yes [provider]  atorvastatin (LIPITOR) 40 MG tablet Take 1 tablet (40 mg total) by mouth every evening. 09/18/12  Yes Serpe, Clide Deutscher, PA-C  benzonatate (TESSALON) 100 MG capsule Take 1 capsule (100 mg total) by mouth 3 (three) times daily as needed for cough. 11/16/18  Yes Rancour, Jeannett Senior, MD  busPIRone (BUSPAR) 5 MG tablet Take 5 mg by mouth 3 (three) times daily.    Yes [provider]  DULoxetine (CYMBALTA) 60 MG capsule Take 1 capsule by mouth daily. 10/04/18  Yes [provider]  ferrous sulfate (IRON SUPPLEMENT) 325 (65 FE) MG tablet Take 325 mg by mouth at bedtime.    Yes [provider]  fluticasone (FLONASE) 50 MCG/ACT nasal spray Place 1 spray into both nostrils daily.   Yes [provider]  furosemide (LASIX) 40 MG tablet TAKE 1 TABLET BY MOUTH TWICE DAILY 11/04/18  Yes Laqueta Linden, MD  ibuprofen (ADVIL,MOTRIN) 800 MG tablet Take 400-800 mg by mouth every 8 (eight) hours as needed for pain.    Yes [provider]  Insulin Glargine (LANTUS SOLOSTAR) 100 UNIT/ML Solostar Pen Inject 50 Units into the skin 2 (two) times daily.    Yes  [provider]  isosorbide mononitrate (IMDUR) 30 MG 24 hr tablet TAKE 1 TABLET BY MOUTH ONCE DAILY 06/06/18  Yes Laqueta Linden, MD  lisinopril (PRINIVIL,ZESTRIL) 20 MG tablet Take 1 tablet (20 mg total) by mouth daily. 05/14/15  Yes Laqueta Linden, MD  loratadine (CLARITIN) 10 MG tablet Take 10 mg by mouth daily.   Yes [provider]  metFORMIN (GLUCOPHAGE) 1000 MG tablet Take 1,000 mg by mouth 2 (two) times daily.   Yes [provider]  nitroGLYCERIN (NITROSTAT) 0.4 MG SL tablet Place 1 tablet (0.4 mg total) under the tongue every 5 (five) minutes as needed for chest pain. 09/25/16  Yes Jodelle Gross, NP  pantoprazole (PROTONIX) 40 MG tablet Take 40 mg by mouth every morning.   Yes [provider]  polyethylene glycol powder (GLYCOLAX/MIRALAX)  powder Take 17 g by mouth daily.   Yes [provider]  spironolactone (ALDACTONE) 25 MG tablet TAKE 1 TABLET BY MOUTH ONCE DAILY 09/25/18  Yes Laqueta Linden, MD  tetrahydrozoline (EYE DROPS) 0.05 % ophthalmic solution Place 2 drops into both eyes daily as needed.   Yes [provider]  traZODone (DESYREL) 50 MG tablet Take 1 tablet by mouth at bedtime as needed.  01/15/13  Yes [provider]    Inpatient Medications: Scheduled Meds: . acebutolol  200 mg Oral BID  . amLODipine  10 mg Oral Daily  . aspirin EC  325 mg Oral Daily  . atorvastatin  40 mg Oral QPM  . busPIRone  10 mg Oral BID  . citalopram  20 mg Oral BH-q7a  . ferrous sulfate  325 mg Oral QHS  . heparin injection (subcutaneous)  5,000 Units Subcutaneous Q8H  . insulin aspart  0-15 Units Subcutaneous TID WC  . insulin aspart  0-5 Units Subcutaneous QHS  . insulin glargine  25 Units Subcutaneous BID  . isosorbide mononitrate  30 mg Oral Daily  . lisinopril  20 mg Oral Daily  . loratadine  10 mg Oral Daily  . pantoprazole  40 mg Oral BH-q7a  . spironolactone  25 mg Oral Daily   Continuous  Infusions:  PRN Meds: acetaminophen, alum & mag hydroxide-simeth, hydrOXYzine, nitroGLYCERIN, nitroGLYCERIN, ondansetron (ZOFRAN) IV  Allergies:   No Known Allergies  Social History:   Social History   Socioeconomic History  . Marital status: Divorced    Spouse name: Not on file  . Number of children: Not on file  . Years of education: Not on file  . Highest education level: Not on file  Occupational History  . Not on file  Social Needs  . Financial resource strain: Not on file  . Food insecurity:    Worry: Not on file    Inability: Not on file  . Transportation needs:    Medical: Not on file    Non-medical: Not on file  Tobacco Use  . Smoking status: Former Smoker    Packs/day: 1.00    Years: 30.00    Pack years: 30.00    Types: Cigarettes    Last attempt to quit: 11/13/1994    Years since quitting: 24.0  . Smokeless tobacco: Never Used  Substance and Sexual Activity  . Alcohol use: No  . Drug use: No  . Sexual activity: Not Currently  Lifestyle  . Physical activity:    Days per week: Not on file    Minutes per session: Not on file  . Stress: Not on file  Relationships  . Social connections:    Talks on phone: Not on file    Gets together: Not on file    Attends religious service: Not on file    Active member of club or organization: Not on file    Attends meetings of clubs or organizations: Not on file    Relationship status: Not on file  . Intimate partner violence:    Fear of current or ex partner: Not on file    Emotionally abused: Not on file    Physically abused: Not on file    Forced sexual activity: Not on file  Other Topics Concern  . Not on file  Social History Narrative  . Not on file     Family History:    Family History  Problem Relation Age of Onset  . Heart attack Mother   .  Cerebral aneurysm Father   . Cerebral aneurysm Other   . Diabetes Other   . Hypertension Other       Review of Systems    General:  No chills, fever,  night sweats or weight changes.  Cardiovascular:  No dyspnea on exertion, edema, orthopnea, palpitations, paroxysmal nocturnal dyspnea. Positive for chest pain.  Dermatological: No rash, lesions/masses Respiratory: No cough, dyspnea Urologic: No hematuria, dysuria Abdominal:   No nausea, vomiting, diarrhea, bright red blood per rectum, melena, or hematemesis Neurologic:  No visual changes, wkns, changes in mental status. All other systems reviewed and are otherwise negative except as noted above.  Physical Exam/Data    Vitals:   11/26/18 0842 11/26/18 0900 11/26/18 0930 11/26/18 1000  BP: (!) 145/93 (!) 168/99 (!) 148/83 (!) 163/83  Pulse: 74  79 77  Resp: 19 18 (!) 21 (!) 22  Temp:      TempSrc:      SpO2: 98%  96% 96%  Weight:      Height:       No intake or output data in the 24 hours ending 11/26/18 1100 Filed Weights   11/26/18 0610  Weight: 111.1 kg   Body mass index is 35.15 kg/m.   General: Pleasant, African American male appearing in NAD Psych: Normal affect. Neuro: Alert and oriented X 3. Moves all extremities spontaneously. HEENT: Normal  Neck: Supple without bruits or JVD. Lungs:  Resp regular and unlabored, CTA without wheezing or rales Heart: RRR no s3, s4, or murmurs. Abdomen: Soft, non-tender, non-distended, BS + x 4.  Extremities: No clubbing, cyanosis or lower extremity edema. DP/PT/Radials 2+ and equal bilaterally.   Labs/Studies     Relevant CV Studies:  Echocardiogram: 11/2011 Study Conclusions  - Left ventricle: There was severe concentric hypertrophy. Systolic function was normal. The estimated ejection fraction was in the range of 55% to 60%. Capili motion was normal; there were no regional Sedita motion abnormalities. Doppler parameters are consistent with abnormal left ventricular relaxation (grade 1 diastolic dysfunction). - Aortic valve: Valve area: 3.71cm^2(VTI). Valve area: 3.51cm^2 (Vmax). - Mitral valve: Calcified  annulus. Mildly thickened leaflets  NST: 09/2016  There was no ST segment deviation noted during stress.  T wave inversion was noted during stress in the III, aVF and V6 leads.  The study is normal.  This is a low risk study.  The left ventricular ejection fraction is hyperdynamic (>65%).  Blood pressure demonstrated a normal response to exercise.   Diaphragmatic attenuation no ischemia or infarction  EF 71%  Laboratory Data:  Chemistry Recent Labs  Lab 11/26/18 0616  NA 137  K 3.6  CL 101  CO2 27  GLUCOSE 232*  BUN 19  CREATININE 1.12  CALCIUM 8.9  GFRNONAA >60  GFRAA >60  ANIONGAP 9    No results for input(s): PROT, ALBUMIN, AST, ALT, ALKPHOS, BILITOT in the last 168 hours. Hematology Recent Labs  Lab 11/26/18 0616  WBC 7.9  RBC 4.80  HGB 13.8  HCT 42.3  MCV 88.1  MCH 28.8  MCHC 32.6  RDW 13.0  PLT 372   Cardiac Enzymes Recent Labs  Lab 11/26/18 0800  TROPONINI <0.03    Recent Labs  Lab 11/26/18 0619  TROPIPOC 0.01    BNPNo results for input(s): BNP, PROBNP in the last 168 hours.  DDimer No results for input(s): DDIMER in the last 168 hours.  Radiology/Studies:  Dg Chest 2 View  Result Date: 11/26/2018 CLINICAL DATA:  Chest pain that  started last night EXAM: CHEST - 2 VIEW COMPARISON:  11/16/2018 chest CT FINDINGS: Normal heart size and stable mediastinal contours. Low volume chest with mild interstitial crowding. There is no edema, consolidation, effusion, or pneumothorax. Artifact from EKG leads. IMPRESSION: No evidence of active disease. Electronically Signed   By: Marnee SpringJonathon  Watts M.D.   On: 11/26/2018 06:43     Assessment & Plan    1. Chest Pain with Mixed Typical and Atypical Features - presented with two episodes of chest pain which awoke him from sleep. Not associated with exertion or positional changes. Was previously having episodes of chest pain with coughing but says his presenting symptoms were different.  - he has a history  of nonobstructive CAD by cath in 2012 with most recent ischemic evaluation being a low-risk NST in 2017. Initial and delta troponin values have been negative with cyclic values pending. EKG from 11/16/2018 showed new TWI along the inferior and lateral lead but repeat tracing this admission shows his TWI has improved. - given his presenting symptoms and multiple risk factors, would anticipate further ischemic evaluation tomorrow once cardiac enzymes have been fully cycled and an echocardiogram has been obtained. If he rules-out and echo shows no acute findings, will pursue a [employer] Myoview tomorrow. If enzymes become elevated or echo shows a newly reduced EF or WMA, he would require a catheterization for definitive evaluation. Will make the patient NPO after midnight.  - continue ASA, BB, statin, and Imdur.   2. HTN - BP has been elevated at 142/83 - 170/99 while in the ED. He has not yet received all of his AM medications. Continue to follow. Has been continued on PTA Acebutolol,  Amlodipine, Imdur, Lisinopril, and Spironolactone.    3. HLD - followed by PCP. Repeat FLP pending. Has been continued on PTA Atorvastatin 40mg  daily.   4. Thoracic Aortic Aneurysm - at 4.1 cm by imaging in 11/2018. Continue to follow.    For questions or updates, please contact [facility] Please consult [email or web address] for contact info under Cardiology/STEMI.  Signed, Ellsworth LennoxBrittany M Strader, PA-C 11/26/2018, 11:00 AM Pager: [phone]   Attending note:  Patient seen and examined.  Case discussed with Ms. Iran OuchStrader PA-C.  Mr. Daleen SquibbWall presents with a history of nonobstructive CAD by cardiac catheterization in 2012 (low risk Myoview 2017), chronic diastolic heart failure, hypertension, and an asymptomatic thoracic aortic aneurysm measuring 4.1 cm.  He has been experiencing recent chest discomfort although also with associated congestion and a productive cough.  He thought that he had a recent cold as some of the symptoms  improved, however he has continued to have recurring episodes of relatively sharp chest discomfort.  On examination in the ER he appears comfortable.  Systolic blood pressure in the 160s, heart rate in the 70s in sinus rhythm by telemetry which I personally reviewed.  Lungs are clear without labored breathing.  Cardiac exam reveals RRR without significant murmur or gallop.  Lab work shows potassium 3.6, BUN 19, creatinine 1.12, troponin I 0.01, hemoglobin 13.8, platelets 372, TSH 3.313.  Recent chest CTA showed stable appearing thoracic aortic aneurysm of 4.1 cm.  I personally reviewed his ECG which shows sinus rhythm with IVCD/left anterior fascicular block, increased voltage, nonspecific T wave changes.  Patient presents with recurrent chest discomfort of relatively recent onset, also possible cold-like symptoms although these have improved overall.  Initial troponin I is negative.  ECG shows nonspecific changes.  He is being admitted to the hospitalist service for further observation.  Would continue to cycle cardiac markers, remain on aspirin, beta-blocker, statin, and Imdur.  Echocardiogram will be obtained.  Unless there is a decrease in LVEF or new Alleman motion abnormalities, or an increase in his cardiac markers, will plan on inpatient Lexiscan Myoview for tomorrow.  Jonelle SidleSamuel G. Colene Mines, M.D., F.A.C.C.

## 2018-11-27 ENCOUNTER — Observation Stay (HOSPITAL_BASED_OUTPATIENT_CLINIC_OR_DEPARTMENT_OTHER): Payer: Medicaid Other

## 2018-11-27 ENCOUNTER — Other Ambulatory Visit: Payer: Self-pay | Admitting: Cardiovascular Disease

## 2018-11-27 ENCOUNTER — Encounter (HOSPITAL_COMMUNITY): Payer: Self-pay

## 2018-11-27 DIAGNOSIS — R079 Chest pain, unspecified: Secondary | ICD-10-CM | POA: Diagnosis not present

## 2018-11-27 DIAGNOSIS — R072 Precordial pain: Secondary | ICD-10-CM | POA: Diagnosis not present

## 2018-11-27 LAB — NM MYOCAR MULTI W/SPECT W/WALL MOTION / EF
LV dias vol: 98 mL (ref 62–150)
LV sys vol: 50 mL
Peak HR: 102 {beats}/min
RATE: 0.33
Rest HR: 78 {beats}/min
SDS: 4
SRS: 0
SSS: 4
TID: 1.06

## 2018-11-27 LAB — LIPID PANEL
Cholesterol: 104 mg/dL (ref 0–200)
HDL: 27 mg/dL — ABNORMAL LOW (ref >40)
LDL Cholesterol: 3 mg/dL (ref 0–99)
Total CHOL/HDL Ratio: 3.9 RATIO
Triglycerides: 369 mg/dL — ABNORMAL HIGH (ref <150)
VLDL: 74 mg/dL — ABNORMAL HIGH (ref 0–40)

## 2018-11-27 LAB — GLUCOSE, CAPILLARY
Glucose-Capillary: 253 mg/dL — ABNORMAL HIGH (ref 70–99)
Glucose-Capillary: 454 mg/dL — ABNORMAL HIGH (ref 70–99)

## 2018-11-27 LAB — HIV ANTIBODY (ROUTINE TESTING W REFLEX): HIV Screen 4th Generation wRfx: NONREACTIVE

## 2018-11-27 MED ORDER — INSULIN GLARGINE 100 UNIT/ML ~~LOC~~ SOLN
50.0000 [IU] | Freq: Once | SUBCUTANEOUS | Status: AC
  Administered 2018-11-27: 50 [IU] via SUBCUTANEOUS
  Filled 2018-11-27: qty 0.5

## 2018-11-27 MED ORDER — TECHNETIUM TC 99M TETROFOSMIN IV KIT
30.0000 | PACK | Freq: Once | INTRAVENOUS | Status: AC | PRN
  Administered 2018-11-27: 29.8 via INTRAVENOUS

## 2018-11-27 MED ORDER — REGADENOSON 0.4 MG/5ML IV SOLN
0.4000 mg | Freq: Once | INTRAVENOUS | Status: AC | PRN
  Administered 2018-11-27: 0.4 mg via INTRAVENOUS
  Filled 2018-11-27: qty 5

## 2018-11-27 MED ORDER — TECHNETIUM TC 99M TETROFOSMIN IV KIT
10.0000 | PACK | Freq: Once | INTRAVENOUS | Status: AC | PRN
  Administered 2018-11-27: 9.7 via INTRAVENOUS

## 2018-11-27 NOTE — Progress Notes (Signed)
    Stress Test showed:    No diagnostic ST segment changes to indicate ischemia.  Small, mild intensity, partially reversible inferior defect most prominent on rest imaging and therefore suggestive of variable soft tissue attenuation. No definitive ischemic defects.  This is a low risk study.  Nuclear stress EF: 49%. Visually, LV contraction appears normal.   Reviewed results with patient and will make the admitting team aware.   [facility] will sign off.   Medication Recommendations: Continue current medication regimen Other recommendations (labs, testing, etc): None Follow up as an outpatient: 4-6 weeks. Will arrange.   Signed, Ellsworth LennoxBrittany M Novia Lansberry, PA-C 11/27/2018, 11:27 AM Pager: [phone]

## 2018-11-27 NOTE — Progress Notes (Addendum)
Progress Note  Patient Name: Matthew Cain Date of Encounter: 11/27/2018  Primary Cardiologist: Prentice Docker, MD   Subjective   He reports one episode of chest discomfort earlier this morning, lasting for only a few seconds. No palpitations or dyspnea. Evaluated at the time of stress testing. Lexiscan administered without complication.   Inpatient Medications    Scheduled Meds: . acebutolol  200 mg Oral BID  . amLODipine  10 mg Oral Daily  . aspirin EC  81 mg Oral Daily  . atorvastatin  40 mg Oral QPM  . busPIRone  10 mg Oral BID  . citalopram  20 mg Oral BH-q7a  . ferrous sulfate  325 mg Oral QHS  . heparin injection (subcutaneous)  5,000 Units Subcutaneous Q8H  . insulin aspart  0-15 Units Subcutaneous TID WC  . insulin aspart  0-5 Units Subcutaneous QHS  . insulin glargine  25 Units Subcutaneous BID  . isosorbide mononitrate  30 mg Oral Daily  . lisinopril  20 mg Oral Daily  . loratadine  10 mg Oral Daily  . pantoprazole  40 mg Oral BH-q7a  . pneumococcal 23 valent vaccine  0.5 mL Intramuscular Tomorrow-1000  . spironolactone  25 mg Oral Daily   Continuous Infusions:  PRN Meds: acetaminophen, alum & mag hydroxide-simeth, hydrOXYzine, nitroGLYCERIN, nitroGLYCERIN, ondansetron (ZOFRAN) IV, technetium tetrofosmin   Vital Signs    Vitals:   11/26/18 2000 11/26/18 2123 11/27/18 0016 11/27/18 0400  BP: (!) 153/81  (!) 150/98 (!) 155/88  Pulse: 77  75 81  Resp: 20  18 16   Temp: 98 F (36.7 C)  98.4 F (36.9 C) 98.6 F (37 C)  TempSrc: Oral  Oral Oral  SpO2: 97% 97% 92% 96%  Weight:      Height:        Intake/Output Summary (Last 24 hours) at 11/27/2018 0740 Last data filed at 11/26/2018 1700 Gross per 24 hour  Intake 120 ml  Output -  Net 120 ml   Filed Weights   11/26/18 0610 11/26/18 1704  Weight: 111.1 kg 111 kg    Telemetry    NSR, HR in 60's to 80's with occasional PVC's. - Personally Reviewed  ECG    NSR, HR 80, with LAD and nonspecific  IVCD.   - Personally Reviewed  Physical Exam   General: Well developed, well nourished African American male appearing in no acute distress. Head: Normocephalic, atraumatic.  Neck: Supple without bruits, JVD not elevated. Lungs:  Resp regular and unlabored, CTA without wheezing or rales. Heart: RRR, S1, S2, no S3, S4, or murmur; no rub. Abdomen: Soft, non-tender, non-distended with normoactive bowel sounds. No hepatomegaly. No rebound/guarding. No obvious abdominal masses. Extremities: No clubbing, cyanosis, or lower extremity edema. Distal pedal pulses are 2+ bilaterally. Neuro: Alert and oriented X 3. Moves all extremities spontaneously. Psych: Normal affect.  Labs    Chemistry Recent Labs  Lab 11/26/18 0616  NA 137  K 3.6  CL 101  CO2 27  GLUCOSE 232*  BUN 19  CREATININE 1.12  CALCIUM 8.9  GFRNONAA >60  GFRAA >60  ANIONGAP 9     Hematology Recent Labs  Lab 11/26/18 0616  WBC 7.9  RBC 4.80  HGB 13.8  HCT 42.3  MCV 88.1  MCH 28.8  MCHC 32.6  RDW 13.0  PLT 372    Cardiac Enzymes Recent Labs  Lab 11/26/18 0800 11/26/18 1204 11/26/18 2027  TROPONINI <0.03 <0.03 <0.03    Recent Labs  Lab 11/26/18 2811  TROPIPOC 0.01     BNPNo results for input(s): BNP, PROBNP in the last 168 hours.   DDimer No results for input(s): DDIMER in the last 168 hours.   Radiology    Dg Chest 2 View  Result Date: 11/26/2018 CLINICAL DATA:  Chest pain that started last night EXAM: CHEST - 2 VIEW COMPARISON:  11/16/2018 chest CT FINDINGS: Normal heart size and stable mediastinal contours. Low volume chest with mild interstitial crowding. There is no edema, consolidation, effusion, or pneumothorax. Artifact from EKG leads. IMPRESSION: No evidence of active disease. Electronically Signed   By: Marnee SpringJonathon  Watts M.D.   On: 11/26/2018 06:43    Cardiac Studies   Echocardiogram: 11/2018 Study Conclusions  - Left ventricle: The cavity size was normal. Systolic function was    normal. The estimated ejection fraction was in the range of 60%   to 65%. Ostrom motion was normal; there were no regional Diekmann   motion abnormalities. Doppler parameters are consistent with   abnormal left ventricular relaxation (grade 1 diastolic   dysfunction). Doppler parameters are consistent with high   ventricular filling pressure. Severe focal basal septal   hypertrophy and otherwise moderate hypertrophy of remaining   myocardium. - Aortic valve: Trileaflet; mildly thickened leaflets. - Mitral valve: Moderately calcified annulus.  Patient Profile     63 y.o. 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   Assessment & Plan    1. Chest Pain with Mixed Typical and Atypical Features - presented with two episodes of chest pain which awoke him from sleep. Not associated with exertion or positional changes. Reports only one episode of chest pain since admission which lasted for a few seconds then spontaneously resolved.  - cyclic troponin values have been negative this admission. EKG does show nonspecific ST changes along the inferior leads which is similar to prior tracings earlier this year but new since 2019. Echocardiogram shows a preserved EF of 60-65% with no regional WMA. Was noted to have severe focal basal septal hypertrophy and moderate hypertrophy of the remaining myocardium. - a Lexiscan Myoview was performed this morning with the official report pending following stress images. If overall low-risk, he would be stable for discharge from a cardiac perspective later today. If significant ischemia noted, will need to proceed with a catheterization.  - continue current medication regimen with ASA, BB, statin, and Imdur.   2. HTN/ Hypertensive Heart Disease - BP has been variable at  145/81 - 169/99 since admission. Has been continued on PTA Acebutolol,  Amlodipine, Imdur, Lisinopril, and Spironolactone. Would recommend titration of Lisinopril to 40mg  daily if BP remains above goal.     3. HLD - followed by PCP. FLP this admission shows total cholesterol of 104, triglycerides 369, HDL 27, and LDL 3. Has been continued on PTA Atorvastatin 40mg  daily.   4. Thoracic Aortic Aneurysm - at 4.1 cm by imaging in 11/2018. Will continue to follow as an outpatient.    For questions or updates, please contact [facility] Please consult [email or web address] for contact info under Cardiology/STEMI.   Lorri FrederickSigned, Brittany M Strader , PA-C 7:40 AM 11/27/2018 Pager: [phone]   Attending note:  Patient seen at time of Lexiscan Myoview this morning.  I agree with her above assessment and plan.  Case discussed with Ms.  Strader PA-C.  He did have one brief episode of chest pain this morning, however cardiac enzymes are negative for ACS.  Plan is to follow-up on [facility] with further recommendations to follow.  Jonelle Sidle, M.D., F.A.C.C.

## 2018-11-27 NOTE — Discharge Summary (Signed)
Physician Discharge Summary  Matthew Cain ONG:[id number] DOB: 05-24-1956 DOA: 11/26/2018  PCP: Kizzie Furnish D., PA-C  Admit date: 11/26/2018  Discharge date: 11/27/2018  Admitted From:Home  Disposition:  Home  Recommendations for Outpatient Follow-up:  1. Follow up with PCP in 1-2 weeks 2. Follow-up with cardiology Dr. Purvis Sheffield on 01/10/2019 as scheduled 3. Continue on home medications as prior  Home Health: None  Equipment/Devices: None  Discharge Condition: Stable  CODE STATUS: Full  Diet recommendation: Heart Healthy  Brief/Interim Summary: Per HPI: 63 year old male with PMH of hyperlipidemia, hypertension, hx of non-obstructive CAD,  chronic diastolic CHF, thoracic aorta aneurysm (4.1 cm by imaging in 11/2018), CKD, DM type 2, anxiety and depression; who came to the ED due to a stabbing, 9-10/10 chest pain (for 2 weeks approximated) that radiates to his right shoulder and gets worst with exertion. Episode happened this morning around 2AM waking him up from his sleep and responded to nitroglycerin. However, chest discomfort came back at 4AM and at that time he decided to come to the hospital. Patient states having headaches, reflux symptoms, dark urine (yesterday was the only and last time), constipation and left lower back pain that he does not associate with his current condition.   Denies fevers, diaphoresis, dyspnea nausea or vomiting; denies also any changes in appetite, hemoptysis, abdominal pain, dysuria, hematochezia and melena.  Initial troponin value negative. CXR shows no active cardiopulmonary disease. TWI along inferior and lateral leads improved in contrast to prior tracings. Cardiology has been consulted, troponin and antihypertensive agents/ASA given in ED. TRH consulted to brin into the hospital for ACS r/o. Heart score 5.  Patient had no elevation in troponin levels and was seen by cardiology with stress test that was performed demonstrating results as noted  below.  He has no evidence of active ischemia and will follow-up with cardiology in the outpatient setting in 4 to 6 weeks and continue on home medications as prior.  No other symptomatology otherwise noted.  No other acute events during this hospitalization.  Stress Test showed:    No diagnostic ST segment changes to indicate ischemia.  Small, mild intensity, partially reversible inferior defect most prominent on rest imaging and therefore suggestive of variable soft tissue attenuation. No definitive ischemic defects.  This is a low risk study.  Nuclear stress EF: 49%. Visually, LV contraction appears normal.   Discharge Diagnoses:  Principal Problem:   Chest pain Active Problems:   CAD (coronary artery disease), native coronary artery   HTN (hypertension)   DM (diabetes mellitus) (HCC)  Principal discharge diagnosis: Atypical/typical chest pain without signs of ACS.  Discharge Instructions  Discharge Instructions    Diet - low sodium heart healthy   Complete by:  As directed    Increase activity slowly   Complete by:  As directed      Allergies as of 11/27/2018   No Known Allergies     Medication List    STOP taking these medications   furosemide 40 MG tablet Commonly known as:  LASIX     TAKE these medications   acebutolol 200 MG capsule Commonly known as:  SECTRAL TAKE 1 CAPSULE BY MOUTH TWICE DAILY   amLODipine 10 MG tablet Commonly known as:  NORVASC Take 1 tablet (10 mg total) by mouth daily. What changed:  when to take this   aspirin EC 81 MG tablet Take 81 mg by mouth daily.   atorvastatin 40 MG tablet Commonly known as:  LIPITOR Take 1  tablet (40 mg total) by mouth every evening.   benzonatate 100 MG capsule Commonly known as:  TESSALON Take 1 capsule (100 mg total) by mouth 3 (three) times daily as needed for cough.   busPIRone 5 MG tablet Commonly known as:  BUSPAR Take 5 mg by mouth 3 (three) times daily.   CYMBALTA 60 MG  capsule Generic drug:  DULoxetine Take 1 capsule by mouth daily.   EYE DROPS 0.05 % ophthalmic solution Generic drug:  tetrahydrozoline Place 2 drops into both eyes daily as needed.   fluticasone 50 MCG/ACT nasal spray Commonly known as:  FLONASE Place 1 spray into both nostrils daily.   ibuprofen 800 MG tablet Commonly known as:  ADVIL,MOTRIN Take 400-800 mg by mouth every 8 (eight) hours as needed for pain.   IRON SUPPLEMENT 325 (65 FE) MG tablet Generic drug:  ferrous sulfate Take 325 mg by mouth at bedtime.   isosorbide mononitrate 30 MG 24 hr tablet Commonly known as:  IMDUR TAKE 1 TABLET BY MOUTH ONCE DAILY   LANTUS SOLOSTAR 100 UNIT/ML Solostar Pen Generic drug:  Insulin Glargine Inject 50 Units into the skin 2 (two) times daily.   lisinopril 20 MG tablet Commonly known as:  PRINIVIL,ZESTRIL Take 1 tablet (20 mg total) by mouth daily.   loratadine 10 MG tablet Commonly known as:  CLARITIN Take 10 mg by mouth daily.   metFORMIN 1000 MG tablet Commonly known as:  GLUCOPHAGE Take 1,000 mg by mouth 2 (two) times daily.   nitroGLYCERIN 0.4 MG SL tablet Commonly known as:  NITROSTAT Place 1 tablet (0.4 mg total) under the tongue every 5 (five) minutes as needed for chest pain.   pantoprazole 40 MG tablet Commonly known as:  PROTONIX Take 40 mg by mouth every morning.   polyethylene glycol powder powder Commonly known as:  GLYCOLAX/MIRALAX Take 17 g by mouth daily.   spironolactone 25 MG tablet Commonly known as:  ALDACTONE TAKE 1 TABLET BY MOUTH ONCE DAILY   traZODone 50 MG tablet Commonly known as:  DESYREL Take 1 tablet by mouth at bedtime as needed.      Follow-up Information    Laqueta Linden, MD Follow up on 01/10/2019.   Specialty:  Cardiology Why:  Cardiology Follow-Up on 01/10/2019 at 10:00AM. This will be at the [place] office (attached to [facility]) due to provider availability. Please call to reschedule if date/time does  not work.  Contact information: [address] [place] [address] [phone]        Kizzie Furnish D., PA-C Follow up in 2 week(s).   Contact information: [address] [place] [address] [phone]          No Known Allergies  Consultations:  Cardiology   Procedures/Studies: Dg Chest 2 View  Result Date: 11/26/2018 CLINICAL DATA:  Chest pain that started last night EXAM: CHEST - 2 VIEW COMPARISON:  11/16/2018 chest CT FINDINGS: Normal heart size and stable mediastinal contours. Low volume chest with mild interstitial crowding. There is no edema, consolidation, effusion, or pneumothorax. Artifact from EKG leads. IMPRESSION: No evidence of active disease. Electronically Signed   By: Marnee Spring M.D.   On: 11/26/2018 06:43   Dg Chest 2 View  Result Date: 11/16/2018 CLINICAL DATA:  Acute onset of cough and congestion. Generalized chest pain. EXAM: CHEST - 2 VIEW COMPARISON:  CTA of the chest performed 02/01/2018, and chest radiograph performed 03/06/2017 FINDINGS: The lungs are well-aerated and clear. There is no evidence  of focal opacification, pleural effusion or pneumothorax. The heart is normal in size; the mediastinal contour is within normal limits. No acute osseous abnormalities are seen. IMPRESSION: No acute cardiopulmonary process seen. Electronically Signed   By: Roanna Raider M.D.   On: 11/16/2018 04:55   Nm Myocar Multi W/spect W/Wayson Motion / Ef  Result Date: 11/27/2018  No diagnostic ST segment changes to indicate ischemia.  Small, mild intensity, partially reversible inferior defect most prominent on rest imaging and therefore suggestive of variable soft tissue attenuation. No definitive ischemic defects.  This is a low risk study.  Nuclear stress EF: 49%. Visually, LV contraction appears normal.    Ct Angio Chest Aorta W And/or Wo Contrast  Result Date: 11/16/2018 CLINICAL DATA:  Cough and chest congestion over the last week. Chest pain and pressure.  History of thoracic aneurysm. EXAM: CT ANGIOGRAPHY CHEST WITH CONTRAST TECHNIQUE: Multidetector CT imaging of the chest was performed using the standard protocol during bolus administration of intravenous contrast. Multiplanar CT image reconstructions and MIPs were obtained to evaluate the vascular anatomy. CONTRAST:  ISOVUE-370 IOPAMIDOL (ISOVUE-370) INJECTION 76% COMPARISON:  Chest radiography same day chest CT 02/01/2018. FINDINGS: Cardiovascular: Heart size is normal. No visible coronary artery calcification atherosclerosis of the aorta with atherosclerotic calcification most notable at the arch and descending aorta. Aortic measurements not significantly changed from the previous study. Descending thoracic aorta measurement 2.9 cm. Aortic root at the sinus of Valsalva measurement 3.8 cm and stable. Fusiform dilatation of the ascending aorta 4.1 cm and stable. Proximal aortic arch 3.6 cm and stable. Mediastinum/Nodes: No mass or lymphadenopathy. Lungs/Pleura: No pulmonary infiltrate or collapse. No mass lesion. Two tiny right middle lobe nodules previously seen are no longer visible. Upper Abdomen: Negative Musculoskeletal: Mild curvature in degenerative changes of the thoracic spine. Review of the MIP images confirms the above findings. IMPRESSION: Stable atherosclerotic changes of the aorta with maximal diameter of the ascending aorta 4.1 cm. Recommend annual imaging followup by CTA or MRA. This recommendation follows 2010 ACCF/AHA/AATS/ACR/ASA/SCA/SCAI/SIR/STS/SVM Guidelines for the Diagnosis and Management of Patients with Thoracic Aortic Disease. Circulation. 2010; 121: W295-A213 Electronically Signed   By: Paulina Fusi M.D.   On: 11/16/2018 06:58    Discharge Exam: Vitals:   11/27/18 0016 11/27/18 0400  BP: (!) 150/98 (!) 155/88  Pulse: 75 81  Resp: 18 16  Temp: 98.4 F (36.9 C) 98.6 F (37 C)  SpO2: 92% 96%   Vitals:   11/26/18 2000 11/26/18 2123 11/27/18 0016 11/27/18 0400  BP: (!)  153/81  (!) 150/98 (!) 155/88  Pulse: 77  75 81  Resp: 20  18 16   Temp: 98 F (36.7 C)  98.4 F (36.9 C) 98.6 F (37 C)  TempSrc: Oral  Oral Oral  SpO2: 97% 97% 92% 96%  Weight:      Height:        General: Pt is alert, awake, not in acute distress Cardiovascular: RRR, S1/S2 +, no rubs, no gallops Respiratory: CTA bilaterally, no wheezing, no rhonchi Abdominal: Soft, NT, ND, bowel sounds + Extremities: no edema, no cyanosis    The results of significant diagnostics from this hospitalization (including imaging, microbiology, ancillary and laboratory) are listed below for reference.     Microbiology: No results found for this or any previous visit (from the past 240 hour(s)).   Labs: BNP (last 3 results) Recent Labs    11/16/18 0355  BNP 29.0   Basic Metabolic Panel: Recent Labs  Lab 11/26/18 0616  NA 137  K 3.6  CL 101  CO2 27  GLUCOSE 232*  BUN 19  CREATININE 1.12  CALCIUM 8.9   Liver Function Tests: No results for input(s): AST, ALT, ALKPHOS, BILITOT, PROT, ALBUMIN in the last 168 hours. No results for input(s): LIPASE, AMYLASE in the last 168 hours. No results for input(s): AMMONIA in the last 168 hours. CBC: Recent Labs  Lab 11/26/18 0616  WBC 7.9  HGB 13.8  HCT 42.3  MCV 88.1  PLT 372   Cardiac Enzymes: Recent Labs  Lab 11/26/18 0800 11/26/18 1204 11/26/18 2027  TROPONINI <0.03 <0.03 <0.03   BNP: Invalid input(s): POCBNP CBG: Recent Labs  Lab 11/26/18 1135 11/26/18 1321 11/26/18 2142 11/27/18 0839 11/27/18 1135  GLUCAP 269* 196* 320* 253* 454*   D-Dimer No results for input(s): DDIMER in the last 72 hours. Hgb A1c Recent Labs    11/26/18 0616  HGBA1C 10.4*   Lipid Profile Recent Labs    11/27/18 0511  CHOL 104  HDL 27*  LDLCALC 3  TRIG 369*  CHOLHDL 3.9   Thyroid function studies Recent Labs    11/26/18 0616  TSH 3.313   Anemia work up No results for input(s): VITAMINB12, FOLATE, FERRITIN, TIBC, IRON,  RETICCTPCT in the last 72 hours. Urinalysis    Component Value Date/Time   COLORURINE YELLOW 08/26/2016 1124   APPEARANCEUR CLEAR 08/26/2016 1124   LABSPEC 1.017 08/26/2016 1124   PHURINE 6.0 08/26/2016 1124   GLUCOSEU 500 (A) 08/26/2016 1124   HGBUR NEGATIVE 08/26/2016 1124   BILIRUBINUR NEGATIVE 08/26/2016 1124   KETONESUR NEGATIVE 08/26/2016 1124   PROTEINUR NEGATIVE 08/26/2016 1124   UROBILINOGEN 0.2 09/10/2011 1343   NITRITE NEGATIVE 08/26/2016 1124   LEUKOCYTESUR NEGATIVE 08/26/2016 1124   Sepsis Labs Invalid input(s): PROCALCITONIN,  WBC,  LACTICIDVEN Microbiology No results found for this or any previous visit (from the past 240 hour(s)).   Time coordinating discharge: 35 minutes  SIGNED:   Erick BlinksPratik D Xitlali Kastens, DO [facility] 11/27/2018, 11:47 AM Pager [phone]  If 7PM-7AM, please contact night-coverage [email or web address] Password [email or web address]

## 2018-11-27 NOTE — Progress Notes (Signed)
Pt discharged to home in stable condition.  All discharge instructions reviewed with and given to pt.  Pt transported via wheelchair with transporters x 2 at chairside.  [id number]

## 2019-01-07 ENCOUNTER — Ambulatory Visit: Payer: Medicaid Other | Admitting: Cardiovascular Disease

## 2019-01-10 ENCOUNTER — Ambulatory Visit (INDEPENDENT_AMBULATORY_CARE_PROVIDER_SITE_OTHER): Payer: Medicaid Other | Admitting: Cardiovascular Disease

## 2019-01-10 ENCOUNTER — Encounter: Payer: Self-pay | Admitting: Cardiovascular Disease

## 2019-01-10 VITALS — BP 146/80 | HR 78 | Ht 70.5 in | Wt 244.0 lb

## 2019-01-10 DIAGNOSIS — I5032 Chronic diastolic (congestive) heart failure: Secondary | ICD-10-CM

## 2019-01-10 DIAGNOSIS — I712 Thoracic aortic aneurysm, without rupture, unspecified: Secondary | ICD-10-CM

## 2019-01-10 DIAGNOSIS — R002 Palpitations: Secondary | ICD-10-CM | POA: Diagnosis not present

## 2019-01-10 DIAGNOSIS — I25118 Atherosclerotic heart disease of native coronary artery with other forms of angina pectoris: Secondary | ICD-10-CM | POA: Diagnosis not present

## 2019-01-10 DIAGNOSIS — I10 Essential (primary) hypertension: Secondary | ICD-10-CM

## 2019-01-10 MED ORDER — LISINOPRIL 40 MG PO TABS: 40.0000 mg | ORAL_TABLET | Freq: Every day | ORAL | 3 refills | Status: DC

## 2019-01-10 NOTE — Progress Notes (Signed)
SUBJECTIVE: The patient presents for posthospitalization follow-up.  He was evaluated for chest pain while hospitalized in January 2020.  Troponins were normal.  Blood pressure had been elevated.  Echocardiogram demonstrated normal left ventricular systolic function, LVEF 60 to 16%.  There was severe focal basal septal hypertrophy and otherwise moderate hypertrophy of remainingmyocardium with grade 1 diastolic dysfunction..  Nuclear stress test on 11/27/2018 showed variable soft tissue attenuation with no definitive ischemic defects.  It was deemed a low risk study.  CT angiography of the chest on 11/16/2018 showed maximal ascending aortic diameter of 4.1 cm.  Coronary angiography on 09/09/2011 which demonstrated mild nonobstructive coronary artery disease.  The patient denies any symptoms of chest pain, palpitations, shortness of breath, lightheadedness, dizziness, leg swelling, orthopnea, PND, and syncope.  He said he has a lot of anxiety for fear of pneumonia and respiratory infections in general.  He is worried about the coronavirus.   Soc Hx: He enjoys working on cars and doing body work. He used to sell cars as well. His son lives in [place] and graduated from [address]  His daughter lives in [place] and also graduated from [employer].  She used to be a Biochemist, clinical for the [employer] and now is an Print production planner with [employer] and also teaches dance.  Review of Systems: As per "subjective", otherwise negative.  No Known Allergies  Current Outpatient Medications  Medication Sig Dispense Refill  . acebutolol (SECTRAL) 200 MG capsule TAKE 1 CAPSULE BY MOUTH TWICE DAILY 180 capsule 1  . amLODipine (NORVASC) 10 MG tablet Take 1 tablet (10 mg total) by mouth daily. (Patient taking differently: Take 10 mg by mouth every morning. ) 30 tablet 6  . aspirin EC 81 MG tablet Take 81 mg by mouth daily.    [name] atorvastatin (LIPITOR) 40 MG tablet Take 1 tablet (40 mg total) by mouth  every evening. 30 tablet 6  . benzonatate (TESSALON) 100 MG capsule Take 1 capsule (100 mg total) by mouth 3 (three) times daily as needed for cough. 21 capsule 0  . busPIRone (BUSPAR) 5 MG tablet Take 5 mg by mouth 3 (three) times daily.     . DULoxetine (CYMBALTA) 60 MG capsule Take 1 capsule by mouth daily.    . ferrous sulfate (IRON SUPPLEMENT) 325 (65 FE) MG tablet Take 325 mg by mouth at bedtime.     . fluticasone (FLONASE) 50 MCG/ACT nasal spray Place 1 spray into both nostrils daily.    [name] ibuprofen (ADVIL,MOTRIN) 800 MG tablet Take 400-800 mg by mouth every 8 (eight) hours as needed for pain.     . Insulin Glargine (LANTUS SOLOSTAR) 100 UNIT/ML Solostar Pen Inject 50 Units into the skin 2 (two) times daily.     . isosorbide mononitrate (IMDUR) 30 MG 24 hr tablet TAKE 1 TABLET BY MOUTH ONCE DAILY 90 tablet 3  . lisinopril (PRINIVIL,ZESTRIL) 20 MG tablet Take 1 tablet (20 mg total) by mouth daily. 30 tablet 6  . loratadine (CLARITIN) 10 MG tablet Take 10 mg by mouth daily.    . metFORMIN (GLUCOPHAGE) 1000 MG tablet Take 1,000 mg by mouth 2 (two) times daily.    . nitroGLYCERIN (NITROSTAT) 0.4 MG SL tablet Place 1 tablet (0.4 mg total) under the tongue every 5 (five) minutes as needed for chest pain. 25 tablet 3  . pantoprazole (PROTONIX) 40 MG tablet Take 40 mg by mouth every morning.    . polyethylene glycol powder (GLYCOLAX/MIRALAX) powder  Take 17 g by mouth daily.    [name] spironolactone (ALDACTONE) 25 MG tablet TAKE 1 TABLET BY MOUTH ONCE DAILY 90 tablet 2  . tetrahydrozoline (EYE DROPS) 0.05 % ophthalmic solution Place 2 drops into both eyes daily as needed.    . traZODone (DESYREL) 50 MG tablet Take 1 tablet by mouth at bedtime as needed.      No current facility-administered medications for this visit.     Past Medical History:  Diagnosis Date  . Amputation finger    left ring finger  . Anxiety   . CAD (coronary artery disease)    LHC 10/12:  mild plaque in the LAD but no  obstructive CAD  . CHF (congestive heart failure) (HCC)   . Chronic diastolic heart failure (HCC)    EF 55-60%, grade 1 diastolic dysfunction; no WMAs, echo, 11/2011; s/p pulmo. edema c/b VDRF 11/12; Echo 09/08/11: severe LVH, no SAM, no LVOT gradient, EF 55%, mild BAE, mild reduced RVSF.  [name] CKD (chronic kidney disease)   . Constipation    "due to medication"  . Depression   . Diabetes mellitus    A1C 7.3 08/2011  . GERD (gastroesophageal reflux disease)   . History of kidney stones   . History of right ACA stroke    10/12  --  MRI 09/13/11:  Acute right ACA infarct involving corpus callosum.  MRA 09/13/11: severe intracranial ASD, right ACA occluded, left PCA and right MCA with severe disease.  Dopplers were neg for significant ICA stenosis  . History of transfusion   . HLD (hyperlipidemia)   . Hypertension   . Myocardial infarction (HCC) 07/2011  . Renal insufficiency   . Shortness of breath dyspnea    with exertion  . Spermatocele   . Thoracic aortic aneurysm (HCC)    CT in 10/12: 4.4 cm ascending thoracic aortic aneurysm  . Tongue tied    "at times I get tongue tied since the stroke"    Past Surgical History:  Procedure Laterality Date  .  vasectomy     bilateral  . CARDIAC CATHETERIZATION  09/09/11  . ROTATOR CUFF REPAIR  07/2015   LEFT  . SPERMATOCELECTOMY  1990's   RT side  . SPERMATOCELECTOMY Left 07/20/2015   Procedure: LEFT HYDROCELECTOMY;  Surgeon: Bjorn Pippin, MD;  Location: [facility];  Service: Urology;  Laterality: Left;  . TRANSURETHRAL INCISION OF PROSTATE N/A 07/20/2015   Procedure: TRANSURETHRAL INCISION OF THE PROSTATE (TUIP);  Surgeon: Bjorn Pippin, MD;  Location: [facility];  Service: Urology;  Laterality: N/A;    Social History   Socioeconomic History  . Marital status: Divorced    Spouse name: Not on file  . Number of children: Not on file  . Years of education: Not on file  . Highest education level: Not on file  Occupational History  . Not on file  Social  Needs  . Financial resource strain: Not on file  . Food insecurity:    Worry: Not on file    Inability: Not on file  . Transportation needs:    Medical: Not on file    Non-medical: Not on file  Tobacco Use  . Smoking status: Former Smoker    Packs/day: 1.00    Years: 30.00    Pack years: 30.00    Types: Cigarettes    Last attempt to quit: 11/13/1994    Years since quitting: 24.1  . Smokeless tobacco: Never Used  Substance and Sexual Activity  . Alcohol use:  No  . Drug use: No  . Sexual activity: Not Currently  Lifestyle  . Physical activity:    Days per week: Not on file    Minutes per session: Not on file  . Stress: Not on file  Relationships  . Social connections:    Talks on phone: Not on file    Gets together: Not on file    Attends religious service: Not on file    Active member of club or organization: Not on file    Attends meetings of clubs or organizations: Not on file    Relationship status: Not on file  . Intimate partner violence:    Fear of current or ex partner: Not on file    Emotionally abused: Not on file    Physically abused: Not on file    Forced sexual activity: Not on file  Other Topics Concern  . Not on file  Social History Narrative  . Not on file     Vitals:   01/10/19 0956  BP: (!) 146/80  Pulse: 78  SpO2: 97%  Weight: 244 lb (110.7 kg)  Height: 5' 10.5" (1.791 m)    Wt Readings from Last 3 Encounters:  01/10/19 244 lb (110.7 kg)  11/26/18 244 lb 12.8 oz (111 kg)  01/22/18 248 lb (112.5 kg)     PHYSICAL EXAM General: NAD HEENT: Normal. Neck: No JVD, no thyromegaly. Lungs: Clear to auscultation bilaterally with normal respiratory effort. CV: Regular rate and rhythm, normal S1/S2, no S3/S4, no murmur. No pretibial or periankle edema.  No carotid bruit.   Abdomen: Soft, nontender, no distention.  Neurologic: Alert and oriented.  Psych: Normal affect. Skin: Normal. Musculoskeletal: No gross deformities.    ECG: Reviewed  above under Subjective   Labs: Lab Results  Component Value Date/Time   K 3.6 11/26/2018 06:16 AM   BUN 19 11/26/2018 06:16 AM   CREATININE 1.12 11/26/2018 06:16 AM   TSH 3.313 11/26/2018 06:16 AM   TSH 1.117 09/09/2011 07:00 AM   HGB 13.8 11/26/2018 06:16 AM     Lipids: Lab Results  Component Value Date/Time   LDLCALC 3 11/27/2018 05:11 AM   CHOL 104 11/27/2018 05:11 AM   TRIG 369 (H) 11/27/2018 05:11 AM   HDL 27 (L) 11/27/2018 05:11 AM       ASSESSMENT AND PLAN:  1.  Coronary artery disease: Symptomatically stable.  Nonobstructive disease by cardiac catheterization in October 2012.  Nuclear stress test in January 2020 showed no ischemic defects.  Continue aspirin, Imdur, and atorvastatin.  2.  Chronic diastolic heart failure: Euvolemic.  Continue spironolactone.  Lasix stopped at time of hospital discharge.  3.  Palpitations: Symptomatically stable on a sputum well.  4.  Hypertension: Blood pressure is elevated.  I will increase lisinopril to 40 mg.  5.  Thoracic aortic aneurysm: 4.1 cm by CT angiography in January 2020.  Stable.   Disposition: Follow up 6 months   Prentice DockerSuresh Xiara Knisley, M.D., F.A.C.C.

## 2019-01-10 NOTE — Patient Instructions (Signed)
Medication Instructions:   INCREASE Lisinopril 40 mg daily  Labwork: None today  Procedures/Testing: None today  Follow-Up: 6 months with Dr.Koneswaran  Any Additional Special Instructions Will Be Listed Below (If Applicable).     If you need a refill on your cardiac medications before your next appointment, please call your pharmacy.

## 2019-02-03 ENCOUNTER — Other Ambulatory Visit: Payer: Self-pay | Admitting: Cardiovascular Disease

## 2019-02-03 ENCOUNTER — Ambulatory Visit: Payer: Medicaid Other | Admitting: Cardiovascular Disease

## 2019-05-14 ENCOUNTER — Other Ambulatory Visit: Payer: Self-pay | Admitting: Cardiovascular Disease

## 2019-05-19 ENCOUNTER — Other Ambulatory Visit: Payer: Self-pay | Admitting: Cardiovascular Disease

## 2019-05-30 ENCOUNTER — Ambulatory Visit: Payer: Medicaid Other | Admitting: Urology

## 2019-06-02 ENCOUNTER — Other Ambulatory Visit: Payer: Self-pay | Admitting: Cardiovascular Disease

## 2019-06-21 ENCOUNTER — Other Ambulatory Visit: Payer: Self-pay | Admitting: Cardiovascular Disease

## 2019-06-25 ENCOUNTER — Telehealth: Payer: Self-pay | Admitting: *Deleted

## 2019-06-25 NOTE — Telephone Encounter (Signed)
Acebutolol 200mg  twice a day  - per pharm can not currently get for the patient.    Medicaid will not approve the manufacturer to get then to approve for the patient.    Is there another substitution that you can prescribe ??  Could you give a couple options and I will see if I can verify on Medicaid web page.

## 2019-06-25 NOTE — Telephone Encounter (Signed)
Can try carvedilol 6.25 mg bid, Lopressor 50 mg bid, or Toprol XL 50 mg daily.

## 2019-06-26 MED ORDER — CARVEDILOL 6.25 MG PO TABS: 6.2500 mg | ORAL_TABLET | Freq: Two times a day (BID) | ORAL | 6 refills | Status: DC

## 2019-06-26 NOTE — Telephone Encounter (Signed)
All 3 medications are on the preferred list for [facility] Medicaid formulary 2020.    Will send in the Carvedilol 6.25mg  twice a day to [facility] now.

## 2019-07-15 ENCOUNTER — Telehealth: Payer: Self-pay | Admitting: Cardiovascular Disease

## 2019-07-15 MED ORDER — CARVEDILOL 12.5 MG PO TABS: 12.5000 mg | ORAL_TABLET | Freq: Two times a day (BID) | ORAL | 1 refills | Status: DC

## 2019-07-15 NOTE — Telephone Encounter (Signed)
Increase carvedilol to 12.5 mg twice daily

## 2019-07-15 NOTE — Telephone Encounter (Signed)
Pt says recent start carvedilol 6.25 mg bid doesn't think its controlling HR - doesn't have a way to check HR but c/o palpitations/heart racing - thinks he may need medication increased

## 2019-07-15 NOTE — Telephone Encounter (Signed)
Pt voiced understanding - new rx sent to pharmacy

## 2019-07-15 NOTE — Telephone Encounter (Signed)
Patient called stating that his medication is not working.

## 2019-07-18 ENCOUNTER — Telehealth: Payer: Self-pay | Admitting: Cardiovascular Disease

## 2019-07-18 NOTE — Telephone Encounter (Signed)
Virtual Visit Pre-Appointment Phone Call  "(Name), I am calling you today to discuss your upcoming appointment. We are currently trying to limit exposure to the virus that causes COVID-19 by seeing patients at home rather than in the office."  1. "What is the BEST phone number to call the day of the visit?" - include this in appointment notes  2. Do you have or have access to (through a family member/friend) a smartphone with video capability that we can use for your visit?" a. If yes - list this number in appt notes as cell (if different from BEST phone #) and list the appointment type as a VIDEO visit in appointment notes b. If no - list the appointment type as a PHONE visit in appointment notes  3. Confirm consent - "In the setting of the current Covid19 crisis, you are scheduled for a (phone or video) visit with your provider on (date) at (time).  Just as we do with many in-office visits, in order for you to participate in this visit, we must obtain consent.  If you'd like, I can send this to your mychart (if signed up) or email for you to review.  Otherwise, I can obtain your verbal consent now.  All virtual visits are billed to your insurance company just like a normal visit would be.  By agreeing to a virtual visit, we'd like you to understand that the technology does not allow for your provider to perform an examination, and thus may limit your provider's ability to fully assess your condition. If your provider identifies any concerns that need to be evaluated in person, we will make arrangements to do so.  Finally, though the technology is pretty good, we cannot assure that it will always work on either your or our end, and in the setting of a video visit, we may have to convert it to a phone-only visit.  In either situation, we cannot ensure that we have a secure connection.  Are you willing to proceed?" STAFF: Did the patient verbally acknowledge consent to telehealth visit? Document  YES/NO here: yes  4. Advise patient to be prepared - "Two hours prior to your appointment, go ahead and check your blood pressure, pulse, oxygen saturation, and your weight (if you have the equipment to check those) and write them all down. When your visit starts, your provider will ask you for this information. If you have an Apple Watch or Kardia device, please plan to have heart rate information ready on the day of your appointment. Please have a pen and paper handy nearby the day of the visit as well."  5. Give patient instructions for MyChart download to smartphone OR [email or web address] as below if video visit (depending on what platform provider is using)  6. Inform patient they will receive a phone call 15 minutes prior to their appointment time (may be from unknown caller ID) so they should be prepared to answer    TELEPHONE CALL NOTE  Matthew Cain has been deemed a candidate for a follow-up tele-health visit to limit community exposure during the Covid-19 pandemic. I spoke with the patient via phone to ensure availability of phone/video source, confirm preferred email & phone number, and discuss instructions and expectations.  I reminded Matthew Cain to be prepared with any vital sign and/or heart rhythm information that could potentially be obtained via home monitoring, at the time of his visit. I reminded Matthew Cain to expect a phone call prior to his visit.  Matthew Cain 07/18/2019 9:43 AM   INSTRUCTIONS FOR DOWNLOADING THE MYCHART APP TO SMARTPHONE  - The patient must first make sure to have activated MyChart and know their login information - If Apple, go to [employer] and type in MyChart in the search bar and download the app. If Android, ask patient to go to [employer] and type in [place] in the search bar and download the app. The app is free but as with any other app downloads, their phone may require them to verify saved payment information or Apple/Android password.  - The  patient will need to then log into the app with their MyChart username and password, and select [facility] as their healthcare provider to link the account. When it is time for your visit, go to the MyChart app, find appointments, and click Begin Video Visit. Be sure to Select Allow for your device to access the Microphone and Camera for your visit. You will then be connected, and your provider will be with you shortly.  **If they have any issues connecting, or need assistance please contact MyChart service desk [phone])**  **If using a computer, in order to ensure the best quality for their visit they will need to use either of the following Internet Browsers: [employer], or Google Chrome**  IF USING DOXIMITY or DOXY.ME - The patient will receive a link just prior to their visit by text.     FULL LENGTH CONSENT FOR TELE-HEALTH VISIT   I hereby voluntarily request, consent and authorize [facility] and its employed or contracted physicians, physician assistants, nurse practitioners or other licensed health care professionals (the Practitioner), to provide me with telemedicine health care services (the Services") as deemed necessary by the treating Practitioner. I acknowledge and consent to receive the Services by the Practitioner via telemedicine. I understand that the telemedicine visit will involve communicating with the Practitioner through live audiovisual communication technology and the disclosure of certain medical information by electronic transmission. I acknowledge that I have been given the opportunity to request an in-person assessment or other available alternative prior to the telemedicine visit and am voluntarily participating in the telemedicine visit.  I understand that I have the right to withhold or withdraw my consent to the use of telemedicine in the course of my care at any time, without affecting my right to future care or treatment, and that the  Practitioner or I may terminate the telemedicine visit at any time. I understand that I have the right to inspect all information obtained and/or recorded in the course of the telemedicine visit and may receive copies of available information for a reasonable fee.  I understand that some of the potential risks of receiving the Services via telemedicine include:   Delay or interruption in medical evaluation due to technological equipment failure or disruption;  Information transmitted may not be sufficient (e.g. poor resolution of images) to allow for appropriate medical decision making by the Practitioner; and/or   In rare instances, security protocols could fail, causing a breach of personal health information.  Furthermore, I acknowledge that it is my responsibility to provide information about my medical history, conditions and care that is complete and accurate to the best of my ability. I acknowledge that Practitioner's advice, recommendations, and/or decision may be based on factors not within their control, such as incomplete or inaccurate data provided by me or distortions of diagnostic images or specimens that may result from electronic transmissions. I understand that the  practice of medicine is not an Chief Strategy Officer and that Practitioner makes no warranties or guarantees regarding treatment outcomes. I acknowledge that I will receive a copy of this consent concurrently upon execution via email to the email address I last provided but may also request a printed copy by calling the office of [facility].    I understand that my insurance will be billed for this visit.   I have read or had this consent read to me.  I understand the contents of this consent, which adequately explains the benefits and risks of the Services being provided via telemedicine.   I have been provided ample opportunity to ask questions regarding this consent and the Services and have had my questions answered to my  satisfaction.  I give my informed consent for the services to be provided through the use of telemedicine in my medical care  By participating in this telemedicine visit I agree to the above.

## 2019-07-22 ENCOUNTER — Telehealth (INDEPENDENT_AMBULATORY_CARE_PROVIDER_SITE_OTHER): Payer: Medicaid Other | Admitting: Cardiovascular Disease

## 2019-07-22 ENCOUNTER — Encounter: Payer: Self-pay | Admitting: Cardiovascular Disease

## 2019-07-22 VITALS — Ht 70.0 in | Wt 240.0 lb

## 2019-07-22 DIAGNOSIS — R002 Palpitations: Secondary | ICD-10-CM | POA: Diagnosis not present

## 2019-07-22 DIAGNOSIS — I10 Essential (primary) hypertension: Secondary | ICD-10-CM

## 2019-07-22 DIAGNOSIS — I712 Thoracic aortic aneurysm, without rupture, unspecified: Secondary | ICD-10-CM

## 2019-07-22 DIAGNOSIS — I5032 Chronic diastolic (congestive) heart failure: Secondary | ICD-10-CM

## 2019-07-22 DIAGNOSIS — I25118 Atherosclerotic heart disease of native coronary artery with other forms of angina pectoris: Secondary | ICD-10-CM

## 2019-07-22 NOTE — Progress Notes (Signed)
Virtual Visit via Telephone Note   This visit type was conducted due to national recommendations for restrictions regarding the COVID-19 Pandemic (e.g. social distancing) in an effort to limit this patient's exposure and mitigate transmission in our community.  Due to his co-morbid illnesses, this patient is at least at moderate risk for complications without adequate follow up.  This format is felt to be most appropriate for this patient at this time.  The patient did not have access to video technology/had technical difficulties with video requiring transitioning to audio format only (telephone).  All issues noted in this document were discussed and addressed.  No physical exam could be performed with this format.  Please refer to the patient's chart for his  consent to telehealth for [facility].   Date:  07/22/2019   ID:  [name], DOB 01/27/1956, MRN [id number]  Patient Location: Home Provider Location: Office  PCP:  Raiford Simmonds., PA-C  Cardiologist:  Kate Sable, MD  Electrophysiologist:  None   Evaluation Performed:  Follow-Up Visit  Chief Complaint: Palpitations  History of Present Illness:    Matthew Cain is a 63 y.o. male with palpitations.  He also has coronary disease and chronic diastolic heart failure.  Due to increased palpitations, I increase carvedilol to 12.5 mg twice daily on 07/15/2019.  He is doing much better and denies chest pain, palpitations, and leg swelling.  Chronic exertional dyspnea is stable.  In summary, he was evaluated for chest pain while hospitalized in January 2020.  Troponins were normal.  Blood pressure had been elevated.  Echocardiogram demonstrated normal left ventricular systolic function, LVEF 60 to 65%.  There was severe focal basal septal hypertrophy and otherwise moderate hypertrophy of remainingmyocardium with grade 1 diastolic dysfunction..  Nuclear stress test on 11/27/2018 showed variable soft tissue attenuation with no  definitive ischemic defects.  It was deemed a low risk study.  CT angiography of the chest on 11/16/2018 showed maximal ascending aortic diameter of 4.1 cm.  Coronary angiography on 09/09/2011 which demonstrated mild nonobstructive coronary artery disease.  The patient does not have symptoms concerning for COVID-19 infection (fever, chills, cough).   Soc Hx:He enjoys working on cars and doing body work. He used to sell cars as well.His son lives in [place] and graduated from [employer]. His daughter lives in [place] and also graduated from [employer]. She used to be a Therapist, sports for the [employer] and now is an Glass blower/designer with [employer] and also teaches dance.   Past Medical History:  Diagnosis Date  . Amputation finger    left ring finger  . Anxiety   . CAD (coronary artery disease)    LHC 10/12:  mild plaque in the LAD but no obstructive CAD  . CHF (congestive heart failure) ([place])   . Chronic diastolic heart failure (HCC)    EF 43-15%, grade 1 diastolic dysfunction; no WMAs, echo, 11/2011; s/p pulmo. edema c/b VDRF 11/12; Echo 09/08/11: severe LVH, no SAM, no LVOT gradient, EF 55%, mild BAE, mild reduced RVSF.  [name] CKD (chronic kidney disease)   . Constipation    "due to medication"  . Depression   . Diabetes mellitus    A1C 7.3 08/2011  . GERD (gastroesophageal reflux disease)   . History of kidney stones   . History of right ACA stroke    10/12  --  MRI 09/13/11:  Acute right ACA infarct involving corpus callosum.  MRA 09/13/11: severe intracranial ASD, right ACA occluded, left  PCA and right MCA with severe disease.  Dopplers were neg for significant ICA stenosis  . History of transfusion   . HLD (hyperlipidemia)   . Hypertension   . Myocardial infarction (HCC) 07/2011  . Renal insufficiency   . Shortness of breath dyspnea    with exertion  . Spermatocele   . Thoracic aortic aneurysm (HCC)    CT in 10/12: 4.4 cm ascending thoracic aortic aneurysm  .  Tongue tied    "at times I get tongue tied since the stroke"   Past Surgical History:  Procedure Laterality Date  .  vasectomy     bilateral  . CARDIAC CATHETERIZATION  09/09/11  . ROTATOR CUFF REPAIR  07/2015   LEFT  . SPERMATOCELECTOMY  1990's   RT side  . SPERMATOCELECTOMY Left 07/20/2015   Procedure: LEFT HYDROCELECTOMY;  Surgeon: Bjorn Pippin, MD;  Location: [facility];  Service: Urology;  Laterality: Left;  . TRANSURETHRAL INCISION OF PROSTATE N/A 07/20/2015   Procedure: TRANSURETHRAL INCISION OF THE PROSTATE (TUIP);  Surgeon: Bjorn Pippin, MD;  Location: [facility];  Service: Urology;  Laterality: N/A;     Current Meds  Medication Sig  . amLODipine (NORVASC) 10 MG tablet Take 1 tablet (10 mg total) by mouth daily. (Patient taking differently: Take 10 mg by mouth every morning. )  . aspirin EC 81 MG tablet Take 81 mg by mouth daily.  [name] atorvastatin (LIPITOR) 40 MG tablet Take 1 tablet (40 mg total) by mouth every evening.  . busPIRone (BUSPAR) 5 MG tablet Take 5 mg by mouth 3 (three) times daily.   . carvedilol (COREG) 12.5 MG tablet Take 1 tablet (12.5 mg total) by mouth 2 (two) times daily.  . DULoxetine (CYMBALTA) 60 MG capsule Take 1 capsule by mouth daily.  . ferrous sulfate (IRON SUPPLEMENT) 325 (65 FE) MG tablet Take 325 mg by mouth at bedtime.   . fluticasone (FLONASE) 50 MCG/ACT nasal spray Place 1 spray into both nostrils daily.  [name] ibuprofen (ADVIL,MOTRIN) 800 MG tablet Take 400-800 mg by mouth every 8 (eight) hours as needed for pain.   . Insulin Glargine (LANTUS SOLOSTAR) 100 UNIT/ML Solostar Pen Inject 55 Units into the skin 2 (two) times daily.   . isosorbide mononitrate (IMDUR) 30 MG 24 hr tablet Take 1 tablet by mouth once daily  . lisinopril (PRINIVIL,ZESTRIL) 40 MG tablet Take 1 tablet (40 mg total) by mouth daily.  [name] loratadine (CLARITIN) 10 MG tablet Take 10 mg by mouth daily.  . metFORMIN (GLUCOPHAGE) 1000 MG tablet Take 1,000 mg by mouth 2 (two) times daily.  .  nitroGLYCERIN (NITROSTAT) 0.4 MG SL tablet Place 1 tablet (0.4 mg total) under the tongue every 5 (five) minutes as needed for chest pain.  . pantoprazole (PROTONIX) 40 MG tablet Take 40 mg by mouth every morning.  . polyethylene glycol powder (GLYCOLAX/MIRALAX) powder Take 17 g by mouth daily.  [name] spironolactone (ALDACTONE) 25 MG tablet Take 1 tablet by mouth once daily  . tetrahydrozoline (EYE DROPS) 0.05 % ophthalmic solution Place 2 drops into both eyes daily as needed.  . traZODone (DESYREL) 50 MG tablet Take 1 tablet by mouth at bedtime as needed.      Allergies:   Patient has no known allergies.   Social History   Tobacco Use  . Smoking status: Former Smoker    Packs/day: 1.00    Years: 30.00    Pack years: 30.00    Types: Cigarettes    Quit date: 11/13/1994  Years since quitting: 24.7  . Smokeless tobacco: Never Used  Substance Use Topics  . Alcohol use: No  . Drug use: No     Family Hx: The patient's family history includes Cerebral aneurysm in his father and another family member; Diabetes in an other family member; Heart attack in his mother; Hypertension in an other family member.  ROS:   Please see the history of present illness.     All other systems reviewed and are negative.   Prior CV studies:   The following studies were reviewed today:  See above  Labs/Other Tests and Data Reviewed:    EKG:  No ECG reviewed.  Recent Labs: 11/16/2018: B Natriuretic Peptide 29.0 11/26/2018: BUN 19; Creatinine, Ser 1.12; Hemoglobin 13.8; Platelets 372; Potassium 3.6; Sodium 137; TSH 3.313   Recent Lipid Panel Lab Results  Component Value Date/Time   CHOL 104 11/27/2018 05:11 AM   TRIG 369 (H) 11/27/2018 05:11 AM   HDL 27 (L) 11/27/2018 05:11 AM   CHOLHDL 3.9 11/27/2018 05:11 AM   LDLCALC 3 11/27/2018 05:11 AM    Wt Readings from Last 3 Encounters:  07/22/19 240 lb (108.9 kg)  01/10/19 244 lb (110.7 kg)  11/26/18 244 lb 12.8 oz (111 kg)     Objective:     Vital Signs:  Ht 5\' 10"  (1.778 m)   Wt 240 lb (108.9 kg)   BMI 34.44 kg/m    VITAL SIGNS:  reviewed  ASSESSMENT & PLAN:    1.  Coronary artery disease: Symptomatically stable.  Nonobstructive disease by cardiac catheterization in October 2012.  Nuclear stress test in January 2020 showed no ischemic defects.  Continue aspirin, Imdur, carvedilol and atorvastatin.  2.  Chronic diastolic heart failure: Euvolemic.  Continue spironolactone.  3.  Palpitations: Carvedilol recently increased to 12.5 mg twice daily. He is now doing much better.  4.  Hypertension: He does not have a blood pressure cuff.  Continue present therapy.  5.  Thoracic aortic aneurysm: 4.1 cm by CT angiography in January 2020.  Stable.    COVID-19 Education: The signs and symptoms of COVID-19 were discussed with the patient and how to seek care for testing (follow up with PCP or arrange E-visit).  The importance of social distancing was discussed today.  Time:   Today, I have spent 5 minutes with the patient with telehealth technology discussing the above problems.     Medication Adjustments/Labs and Tests Ordered: Current medicines are reviewed at length with the patient today.  Concerns regarding medicines are outlined above.   Tests Ordered: No orders of the defined types were placed in this encounter.   Medication Changes: No orders of the defined types were placed in this encounter.   Follow Up:  Virtual Visit or In Person in 6 month(s)  Signed, Prentice DockerSuresh Tawfiq Favila, MD  07/22/2019 10:03 AM    [facility]

## 2019-07-22 NOTE — Patient Instructions (Signed)

## 2019-09-20 ENCOUNTER — Other Ambulatory Visit: Payer: Self-pay | Admitting: Cardiovascular Disease

## 2019-11-16 ENCOUNTER — Emergency Department (HOSPITAL_COMMUNITY): Payer: Medicaid Other

## 2019-11-16 ENCOUNTER — Encounter (HOSPITAL_COMMUNITY): Payer: Self-pay | Admitting: Emergency Medicine

## 2019-11-16 ENCOUNTER — Other Ambulatory Visit: Payer: Self-pay

## 2019-11-16 ENCOUNTER — Emergency Department (HOSPITAL_COMMUNITY)
Admission: EM | Admit: 2019-11-16 | Discharge: 2019-11-17 | Disposition: A | Discharge status: Home / Self Care | Payer: Medicaid Other | Attending: Emergency Medicine | Admitting: Emergency Medicine

## 2019-11-16 DIAGNOSIS — I130 Hypertensive heart and chronic kidney disease with heart failure and stage 1 through stage 4 chronic kidney disease, or unspecified chronic kidney disease: Secondary | ICD-10-CM | POA: Insufficient documentation

## 2019-11-16 DIAGNOSIS — Z79899 Other long term (current) drug therapy: Secondary | ICD-10-CM | POA: Insufficient documentation

## 2019-11-16 DIAGNOSIS — I5032 Chronic diastolic (congestive) heart failure: Secondary | ICD-10-CM | POA: Insufficient documentation

## 2019-11-16 DIAGNOSIS — Z87891 Personal history of nicotine dependence: Secondary | ICD-10-CM | POA: Insufficient documentation

## 2019-11-16 DIAGNOSIS — N182 Chronic kidney disease, stage 2 (mild): Secondary | ICD-10-CM | POA: Diagnosis not present

## 2019-11-16 DIAGNOSIS — Z7982 Long term (current) use of aspirin: Secondary | ICD-10-CM | POA: Insufficient documentation

## 2019-11-16 DIAGNOSIS — Z794 Long term (current) use of insulin: Secondary | ICD-10-CM | POA: Diagnosis not present

## 2019-11-16 DIAGNOSIS — R0789 Other chest pain: Secondary | ICD-10-CM | POA: Diagnosis not present

## 2019-11-16 DIAGNOSIS — I2510 Atherosclerotic heart disease of native coronary artery without angina pectoris: Secondary | ICD-10-CM | POA: Diagnosis not present

## 2019-11-16 DIAGNOSIS — R519 Headache, unspecified: Secondary | ICD-10-CM | POA: Diagnosis present

## 2019-11-16 DIAGNOSIS — E1122 Type 2 diabetes mellitus with diabetic chronic kidney disease: Secondary | ICD-10-CM | POA: Insufficient documentation

## 2019-11-16 LAB — CBC
HCT: 35.3 % — ABNORMAL LOW (ref 39.0–52.0)
Hemoglobin: 10.9 g/dL — ABNORMAL LOW (ref 13.0–17.0)
MCH: 26.8 pg (ref 26.0–34.0)
MCHC: 30.9 g/dL (ref 30.0–36.0)
MCV: 86.9 fL (ref 80.0–100.0)
Platelets: 412 10*3/uL — ABNORMAL HIGH (ref 150–400)
RBC: 4.06 MIL/uL — ABNORMAL LOW (ref 4.22–5.81)
RDW: 13.8 % (ref 11.5–15.5)
WBC: 7.6 10*3/uL (ref 4.0–10.5)
nRBC: 0 % (ref 0.0–0.2)

## 2019-11-16 LAB — BASIC METABOLIC PANEL
Anion gap: 13 (ref 5–15)
BUN: 16 mg/dL (ref 8–23)
CO2: 27 mmol/L (ref 22–32)
Calcium: 9.3 mg/dL (ref 8.9–10.3)
Chloride: 97 mmol/L — ABNORMAL LOW (ref 98–111)
Creatinine, Ser: 1.18 mg/dL (ref 0.61–1.24)
GFR calc Af Amer: >60 mL/min (ref >60)
GFR calc non Af Amer: >60 mL/min (ref >60)
Glucose, Bld: 197 mg/dL — ABNORMAL HIGH (ref 70–99)
Potassium: 3.1 mmol/L — ABNORMAL LOW (ref 3.5–5.1)
Sodium: 137 mmol/L (ref 135–145)

## 2019-11-16 LAB — TROPONIN I (HIGH SENSITIVITY): Troponin I (High Sensitivity): 8 ng/L (ref <18)

## 2019-11-16 MED ORDER — BUTALBITAL-APAP-CAFFEINE 50-325-40 MG PO TABS
1.0000 | ORAL_TABLET | Freq: Once | ORAL | Status: AC
  Administered 2019-11-16: 1 via ORAL
  Filled 2019-11-16: qty 1

## 2019-11-16 MED ORDER — POTASSIUM CHLORIDE CRYS ER 20 MEQ PO TBCR
40.0000 meq | EXTENDED_RELEASE_TABLET | Freq: Once | ORAL | Status: AC
  Administered 2019-11-16: 40 meq via ORAL
  Filled 2019-11-16: qty 2

## 2019-11-16 MED ORDER — SODIUM CHLORIDE 0.9% FLUSH: 3.0000 mL | Freq: Once | INTRAVENOUS | Status: DC

## 2019-11-16 NOTE — ED Triage Notes (Signed)
Chest pain and headache that began a month ago when his blood pressure medication was changed. Patient states the new medication is not regulating his heart rate.

## 2019-11-16 NOTE — ED Notes (Signed)
Pt in CT.

## 2019-11-16 NOTE — ED Provider Notes (Signed)
[facility] EMERGENCY DEPARTMENT Provider Note   CSN: [id number] Arrival date & time: 11/16/19  1906     History Chief Complaint  Patient presents with  . Chest Pain    Matthew Cain is a 64 y.o. male.  The history is provided by the patient and medical records. No language interpreter was used.  Chest Pain      64 year old male with history of CAD, CHF, EF of 55-60%, CKD, diabetes presenting for evaluation of chest pain.  Patient report for the past 3 to 4 days he has been experiencing throbbing headache as well as chest discomfort.  He described headache as a persistent throbbing sensation moderate in severity rates as 9 out of 10 and as well as having pain in his chest described as a heart palpitation pulsating sensation throughout the chest 10 out of 10.  Symptoms been persistent and he attributed to change in his medication.  States that he was on a specific blood pressure medication but a month ago due to insurance coverage, he was prescribed a different blood pressure medications and since then he has noticed recurrent chest pain and headache.  He mention significant family history of cardiac disease and stroke therefore he is concerned.  He is scheduled to follow-up with [place] primary care provider tomorrow for medication change however decided to come to the ER today for reassurance.  He denies any specific treatment tried at home.  He denies any fever, chills, runny nose sneezing coughing loss of taste or smell.  Past Medical History:  Diagnosis Date  . Amputation finger    left ring finger  . Anxiety   . CAD (coronary artery disease)    LHC 10/12:  mild plaque in the LAD but no obstructive CAD  . CHF (congestive heart failure) (HCC)   . Chronic diastolic heart failure (HCC)    EF 55-60%, grade 1 diastolic dysfunction; no WMAs, echo, 11/2011; s/p pulmo. edema c/b VDRF 11/12; Echo 09/08/11: severe LVH, no SAM, no LVOT gradient, EF 55%, mild BAE, mild reduced RVSF.  [name] CKD (chronic  kidney disease)   . Constipation    "due to medication"  . Depression   . Diabetes mellitus    A1C 7.3 08/2011  . GERD (gastroesophageal reflux disease)   . History of kidney stones   . History of right ACA stroke    10/12  --  MRI 09/13/11:  Acute right ACA infarct involving corpus callosum.  MRA 09/13/11: severe intracranial ASD, right ACA occluded, left PCA and right MCA with severe disease.  Dopplers were neg for significant ICA stenosis  . History of transfusion   . HLD (hyperlipidemia)   . Hypertension   . Myocardial infarction (HCC) 07/2011  . Renal insufficiency   . Shortness of breath dyspnea    with exertion  . Spermatocele   . Thoracic aortic aneurysm (HCC)    CT in 10/12: 4.4 cm ascending thoracic aortic aneurysm  . Tongue tied    "at times I get tongue tied since the stroke"    Patient Active Problem List   Diagnosis Date Noted  . CKD stage 2 due to type 2 diabetes mellitus (HCC) 08/26/2016  . Chest pain 08/25/2016  . Hydrocele, left 07/21/2015  . 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 Palpitations 03/13/2012  . Panic disorder 02/13/2012  . Thoracic aortic aneurysm (HCC) 11/23/2011  . Chronic diastolic heart failure (HCC)   . CVA (cerebral infarction) 09/17/2011  . CAD (coronary artery  disease), native coronary artery 09/17/2011  . HTN (hypertension) 09/17/2011  . Dyslipidemia 09/17/2011  . DM (diabetes mellitus) (HCC) 09/17/2011  . Renal insufficiency 09/17/2011  . Pneumonia 09/17/2011  . Myocardial infarction (HCC) 07/15/2011    Past Surgical History:  Procedure Laterality Date  .  vasectomy     bilateral  . CARDIAC CATHETERIZATION  09/09/11  . ROTATOR CUFF REPAIR  07/2015   LEFT  . SPERMATOCELECTOMY  1990's   RT side  . SPERMATOCELECTOMY Left 07/20/2015   Procedure: LEFT HYDROCELECTOMY;  Surgeon: Bjorn Pippin, MD;  Location: [facility];  Service: Urology;  Laterality: Left;  . TRANSURETHRAL INCISION OF PROSTATE N/A 07/20/2015    Procedure: TRANSURETHRAL INCISION OF THE PROSTATE (TUIP);  Surgeon: Bjorn Pippin, MD;  Location: [facility];  Service: Urology;  Laterality: N/A;       Family History  Problem Relation Age of Onset  . Heart attack Mother   . Cerebral aneurysm Father   . Cerebral aneurysm Other   . Diabetes Other   . Hypertension Other     Social History   Tobacco Use  . Smoking status: Former Smoker    Packs/day: 1.00    Years: 30.00    Pack years: 30.00    Types: Cigarettes    Quit date: 11/13/1994    Years since quitting: 25.0  . Smokeless tobacco: Never Used  Substance Use Topics  . Alcohol use: No  . Drug use: No    Home Medications Prior to Admission medications   Medication Sig Start Date End Date Taking? Authorizing Provider  amLODipine (NORVASC) 10 MG tablet Take 1 tablet (10 mg total) by mouth daily. Patient taking differently: Take 10 mg by mouth every morning.  09/18/12   Serpe, Clide Deutscher, PA-C  aspirin EC 81 MG tablet Take 81 mg by mouth daily.    [provider]  atorvastatin (LIPITOR) 40 MG tablet Take 1 tablet (40 mg total) by mouth every evening. 09/18/12   Serpe, Clide Deutscher, PA-C  busPIRone (BUSPAR) 5 MG tablet Take 5 mg by mouth 3 (three) times daily.     [provider]  carvedilol (COREG) 12.5 MG tablet Take 1 tablet (12.5 mg total) by mouth 2 (two) times daily. 07/15/19 10/13/19  Laqueta Linden, MD  DULoxetine (CYMBALTA) 60 MG capsule Take 1 capsule by mouth daily. 10/04/18   [provider]  ferrous sulfate (IRON SUPPLEMENT) 325 (65 FE) MG tablet Take 325 mg by mouth at bedtime.     [provider]  fluticasone (FLONASE) 50 MCG/ACT nasal spray Place 1 spray into both nostrils daily.    [provider]  ibuprofen (ADVIL,MOTRIN) 800 MG tablet Take 400-800 mg by mouth every 8 (eight) hours as needed for pain.     [provider]  Insulin Glargine (LANTUS SOLOSTAR) 100 UNIT/ML Solostar Pen Inject 55 Units into the skin 2 (two)  times daily.     [provider]  isosorbide mononitrate (IMDUR) 30 MG 24 hr tablet Take 1 tablet by mouth once daily 06/02/19   Laqueta Linden, MD  lisinopril (PRINIVIL,ZESTRIL) 40 MG tablet Take 1 tablet (40 mg total) by mouth daily. 01/10/19   Laqueta Linden, MD  loratadine (CLARITIN) 10 MG tablet Take 10 mg by mouth daily.    [provider]  metFORMIN (GLUCOPHAGE) 1000 MG tablet Take 1,000 mg by mouth 2 (two) times daily.    [provider]  nitroGLYCERIN (NITROSTAT) 0.4 MG SL tablet Place 1 tablet (0.4  mg total) under the tongue every 5 (five) minutes as needed for chest pain. 09/25/16   Lendon Colonel, NP  pantoprazole (PROTONIX) 40 MG tablet Take 40 mg by mouth every morning.    [provider]  polyethylene glycol powder (GLYCOLAX/MIRALAX) powder Take 17 g by mouth daily.    [provider]  spironolactone (ALDACTONE) 25 MG tablet Take 1 tablet by mouth once daily 09/22/19   Herminio Commons, MD  tetrahydrozoline (EYE DROPS) 0.05 % ophthalmic solution Place 2 drops into both eyes daily as needed.    [provider]  traZODone (DESYREL) 50 MG tablet Take 1 tablet by mouth at bedtime as needed.  01/15/13   [provider]    Allergies    Patient has no known allergies.  Review of Systems   Review of Systems  Cardiovascular: Positive for chest pain.  All other systems reviewed and are negative.   Physical Exam Updated Vital Signs BP (!) 156/80 (BP Location: Right Arm)   Pulse 91   Temp 98.3 F (36.8 C) (Oral)   Resp 16   Ht 5\' 10"  (1.778 m)   Wt 108 kg   SpO2 96%   BMI 34.15 kg/m   Physical Exam Vitals and nursing note reviewed.  Constitutional:      General: He is not in acute distress.    Appearance: He is well-developed.  HENT:     Head: Normocephalic and atraumatic.  Eyes:     Conjunctiva/sclera: Conjunctivae normal.  Cardiovascular:     Rate and Rhythm: Normal rate and regular  rhythm.     Heart sounds: Normal heart sounds.  Pulmonary:     Breath sounds: Normal breath sounds.  Musculoskeletal:     Cervical back: Neck supple.  Skin:    Findings: No rash.  Neurological:     Mental Status: He is alert and oriented to person, place, and time.     GCS: GCS eye subscore is 4. GCS verbal subscore is 5. GCS motor subscore is 6.     Cranial Nerves: Cranial nerves are intact.     Sensory: Sensation is intact.     Motor: Motor function is intact.     ED Results / Procedures / Treatments   Labs (all labs ordered are listed, but only abnormal results are displayed) Labs Reviewed  BASIC METABOLIC PANEL - Abnormal; Notable for the following components:      Result Value   Potassium 3.1 (*)    Chloride 97 (*)    Glucose, Bld 197 (*)    All other components within normal limits  CBC - Abnormal; Notable for the following components:   RBC 4.06 (*)    Hemoglobin 10.9 (*)    HCT 35.3 (*)    Platelets 412 (*)    All other components within normal limits  TROPONIN I (HIGH SENSITIVITY)  TROPONIN I (HIGH SENSITIVITY)    EKG EKG Interpretation  Date/Time:  Sunday November 16 2019 22:35:11 EST Ventricular Rate:  83 PR Interval:    QRS Duration: 121 QT Interval:  396 QTC Calculation: 466 R Axis:   -55 Text Interpretation: Sinus rhythm Borderline prolonged PR interval Left bundle branch block No significant change since last tracing Abnormal ECG Confirmed by Carmin Muskrat [phone]) on 11/16/2019 10:38:04 PM   Radiology DG Chest 2 View  Result Date: 11/16/2019 CLINICAL DATA:  Chest pain EXAM: CHEST - 2 VIEW COMPARISON:  11/26/2018 FINDINGS: Cardiac shadows within normal limits. The lungs are well  aerated bilaterally. No focal infiltrate or sizable effusion is seen. No acute bony abnormality is noted. IMPRESSION: No active cardiopulmonary disease. Electronically Signed   By: Alcide Clever M.D.   On: 11/16/2019 19:58    Procedures Procedures (including critical care  time)  Medications Ordered in ED Medications  sodium chloride flush (NS) 0.9 % injection 3 mL (3 mLs Intravenous Not Given 11/16/19 2236)  potassium chloride SA (KLOR-CON) CR tablet 40 mEq (40 mEq Oral Given 11/16/19 2331)  butalbital-acetaminophen-caffeine (FIORICET) 50-325-40 MG per tablet 1 tablet (1 tablet Oral Given 11/16/19 2331)    ED Course  I have reviewed the triage vital signs and the nursing notes.  Pertinent labs & imaging results that were available during my care of the patient were reviewed by me and considered in my medical decision making (see chart for details).    MDM Rules/Calculators/A&P                      BP 133/68   Pulse 86   Temp 98.3 F (36.8 C) (Oral)   Resp (!) 21   Ht 5\' 10"  (1.778 m)   Wt 108 kg   SpO2 96%   BMI 34.15 kg/m   Final Clinical Impression(s) / ED Diagnoses Final diagnoses:  Bad headache  Atypical chest pain    Rx / DC Orders ED Discharge Orders    None     10:51 PM Patient complains of heart palpitation chest pain and headache for the past 3 to 4 days.  States that he has similar symptoms within the past month since changes in his blood pressure medication.  He is well-appearing, has no focal neuro deficit on exam to suggest stroke.  Chest pain is nonexertional without nausea shortness of breath diaphoresis to suggest ACS.  His blood pressure is within normal limit currently.  EKG without acute ischemic changes.  Labs remarkable for mild hypokalemia with a potassium of 3.1, supplementation given.  Chest x-ray and troponin unremarkable.  12:02 AM Head CT scan and chest x-ray unremarkable.  Blood pressure has normalized.  Patient received headache medication and felt better.  At this time he is stable for discharge.  Recommend outpatient follow-up with PCP tomorrow as previously scheduled.  He may have his BP medication adjusted at that time.  Return precautions discussed.   , PA-C 11/17/19 0030    01/15/20,  MD 11/18/19 1038

## 2019-11-17 LAB — TROPONIN I (HIGH SENSITIVITY): Troponin I (High Sensitivity): 9 ng/L (ref <18)

## 2019-11-17 NOTE — Discharge Instructions (Addendum)
Please follow up with your doctor tomorrow for further management of your blood pressure and your symptoms.  Return to the ER if you have any concerns.

## 2019-11-20 ENCOUNTER — Other Ambulatory Visit: Payer: Self-pay | Admitting: Cardiovascular Disease

## 2019-11-20 ENCOUNTER — Ambulatory Visit: Payer: Medicaid Other | Admitting: Cardiovascular Disease

## 2019-11-20 ENCOUNTER — Encounter: Payer: Self-pay | Admitting: Cardiovascular Disease

## 2019-11-20 ENCOUNTER — Other Ambulatory Visit: Payer: Self-pay

## 2019-11-20 VITALS — BP 126/70 | HR 95 | Ht 70.5 in | Wt 246.8 lb

## 2019-11-20 DIAGNOSIS — R002 Palpitations: Secondary | ICD-10-CM

## 2019-11-20 DIAGNOSIS — I5032 Chronic diastolic (congestive) heart failure: Secondary | ICD-10-CM

## 2019-11-20 DIAGNOSIS — I25118 Atherosclerotic heart disease of native coronary artery with other forms of angina pectoris: Secondary | ICD-10-CM

## 2019-11-20 DIAGNOSIS — I10 Essential (primary) hypertension: Secondary | ICD-10-CM

## 2019-11-20 DIAGNOSIS — E876 Hypokalemia: Secondary | ICD-10-CM

## 2019-11-20 DIAGNOSIS — I712 Thoracic aortic aneurysm, without rupture, unspecified: Secondary | ICD-10-CM

## 2019-11-20 MED ORDER — NITROGLYCERIN 0.4 MG SL SUBL: 0.4000 mg | SUBLINGUAL_TABLET | SUBLINGUAL | 3 refills | Status: AC | PRN

## 2019-11-20 NOTE — Addendum Note (Signed)
Addended by: Lesle Chris on: 11/20/2019 10:17 AM   Modules accepted: Orders

## 2019-11-20 NOTE — Progress Notes (Signed)
SUBJECTIVE: The patient presents for follow-up of coronary artery disease, palpitations, and chronic diastolic heart failure.  In summary, he was evaluated for chest pain while hospitalized in January 2020. Troponins were normal. Blood pressure had been elevated. Echocardiogram demonstrated normal left ventricular systolic function, LVEF 60 to 42%. There was severe focal basal septal hypertrophy and otherwise moderate hypertrophy of remainingmyocardiumwith grade 1 diastolic dysfunction..  Nuclear stress test on 11/27/2018 showed variable soft tissue attenuation with no definitive ischemic defects. It was deemed a low risk study.  CT angiography of the chest on 11/16/2018 showed maximal ascending aortic diameter of 4.1 cm.  Coronary angiographyon 09/09/2011 which demonstrated mild nonobstructive coronary artery disease.  He was recently evaluated for chest pain in the ED on 11/16/2019.  Blood pressure was 156/80 with a heart rate of 91 at the time of evaluation.  He was found to be hypokalemic with a potassium of 3.1 for which he was given supplementation.  Chest x-ray and troponins were unremarkable.  Head CT showed no acute intracranial abnormalities.  He received medications for headache and felt better.  Hemoglobin was low at 10.9.  It was previously 13.8 on 11/26/2018 but had been as low as 9.9 on 09/25/2016.  ECG which I personally reviewed demonstrated sinus rhythm with left bundle branch block.  He is doing much better now.  He takes over-the-counter potassium supplementation twice a day.  Since his last visit with me he has been started on Lasix 40 mg twice daily by another provider.  He denies chest pain, palpitations, orthopnea, leg swelling, and shortness of breath.  He showed me pictures of his daughter, Lowanda Foster, and her boyfriend, Francis Gaines, a Programme researcher, broadcasting/film/video in [place].   Soc Hx:He enjoys working on cars and doing body work. He used to sell cars as well.His son  lives in [place] and graduated from [address]. His daughter, [place], lives in [place] and also graduated from [employer]. She used to be a Biochemist, clinical for the [employer] and now is an Print production planner with [employer] and also teaches dance.  Review of Systems: As per "subjective", otherwise negative.  Allergies  Allergen Reactions  . Tomato Rash    Current Outpatient Medications  Medication Sig Dispense Refill  . amitriptyline (ELAVIL) 10 MG tablet Take 20 mg by mouth at bedtime.    [name] amLODipine (NORVASC) 10 MG tablet Take 1 tablet (10 mg total) by mouth daily. (Patient taking differently: Take 10 mg by mouth every morning. ) 30 tablet 6  . Ascorbic Acid (VITAMIN C) 1000 MG tablet Take 1,000 mg by mouth daily.    [name] aspirin EC 81 MG tablet Take 81 mg by mouth daily.    [name] atorvastatin (LIPITOR) 80 MG tablet Take 80 mg by mouth daily.    . busPIRone (BUSPAR) 5 MG tablet Take 5 mg by mouth 2 (two) times daily.     . carvedilol (COREG) 12.5 MG tablet Take 1 tablet (12.5 mg total) by mouth 2 (two) times daily. 180 tablet 1  . Cholecalciferol (VITAMIN D3) 125 MCG (5000 UT) CAPS Take 1 capsule by mouth daily.    . DULoxetine (CYMBALTA) 60 MG capsule Take 1 capsule by mouth daily.    . famotidine (PEPCID) 40 MG tablet Take 40 mg by mouth daily.    . ferrous sulfate (IRON SUPPLEMENT) 325 (65 FE) MG tablet Take 325 mg by mouth at bedtime.     . fluticasone (FLONASE) 50 MCG/ACT nasal spray Place 1 spray  into both nostrils daily.    . furosemide (LASIX) 40 MG tablet Take 40 mg by mouth 2 (two) times daily.    [name] ibuprofen (ADVIL,MOTRIN) 800 MG tablet Take 400-800 mg by mouth every 8 (eight) hours as needed for pain.     . Insulin Glargine (LANTUS SOLOSTAR) 100 UNIT/ML Solostar Pen Inject 55 Units into the skin 2 (two) times daily.     . isosorbide mononitrate (IMDUR) 30 MG 24 hr tablet Take 1 tablet by mouth once daily 90 tablet 1  . lisinopril (PRINIVIL,ZESTRIL) 40 MG tablet Take 1 tablet  (40 mg total) by mouth daily. 90 tablet 3  . loratadine (CLARITIN) 10 MG tablet Take 10 mg by mouth daily.    . metFORMIN (GLUCOPHAGE) 1000 MG tablet Take 1,000 mg by mouth 2 (two) times daily.    . nitroGLYCERIN (NITROSTAT) 0.4 MG SL tablet Place 1 tablet (0.4 mg total) under the tongue every 5 (five) minutes as needed for chest pain. 25 tablet 3  . polyethylene glycol powder (GLYCOLAX/MIRALAX) powder Take 17 g by mouth daily.    . Potassium 99 MG TABS Take 1 tablet by mouth daily.    [name] spironolactone (ALDACTONE) 25 MG tablet Take 1 tablet by mouth once daily 90 tablet 2  . tetrahydrozoline (EYE DROPS) 0.05 % ophthalmic solution Place 2 drops into both eyes daily as needed.     No current facility-administered medications for this visit.    Past Medical History:  Diagnosis Date  . Amputation finger    left ring finger  . Anxiety   . CAD (coronary artery disease)    LHC 10/12:  mild plaque in the LAD but no obstructive CAD  . CHF (congestive heart failure) (HCC)   . Chronic diastolic heart failure (HCC)    EF 55-60%, grade 1 diastolic dysfunction; no WMAs, echo, 11/2011; s/p pulmo. edema c/b VDRF 11/12; Echo 09/08/11: severe LVH, no SAM, no LVOT gradient, EF 55%, mild BAE, mild reduced RVSF.  [name] CKD (chronic kidney disease)   . Constipation    "due to medication"  . Depression   . Diabetes mellitus    A1C 7.3 08/2011  . GERD (gastroesophageal reflux disease)   . History of kidney stones   . History of right ACA stroke    10/12  --  MRI 09/13/11:  Acute right ACA infarct involving corpus callosum.  MRA 09/13/11: severe intracranial ASD, right ACA occluded, left PCA and right MCA with severe disease.  Dopplers were neg for significant ICA stenosis  . History of transfusion   . HLD (hyperlipidemia)   . Hypertension   . Myocardial infarction (HCC) 07/2011  . Renal insufficiency   . Shortness of breath dyspnea    with exertion  . Spermatocele   . Thoracic aortic aneurysm (HCC)    CT  in 10/12: 4.4 cm ascending thoracic aortic aneurysm  . Tongue tied    "at times I get tongue tied since the stroke"    Past Surgical History:  Procedure Laterality Date  .  vasectomy     bilateral  . CARDIAC CATHETERIZATION  09/09/11  . ROTATOR CUFF REPAIR  07/2015   LEFT  . SPERMATOCELECTOMY  1990's   RT side  . SPERMATOCELECTOMY Left 07/20/2015   Procedure: LEFT HYDROCELECTOMY;  Surgeon: Bjorn Pippin, MD;  Location: [facility];  Service: Urology;  Laterality: Left;  . TRANSURETHRAL INCISION OF PROSTATE N/A 07/20/2015   Procedure: TRANSURETHRAL INCISION OF THE PROSTATE (TUIP);  Surgeon: Bjorn Pippin,  MD;  Location: [facility];  Service: Urology;  Laterality: N/A;    Social History   Socioeconomic History  . Marital status: Divorced    Spouse name: Not on file  . Number of children: Not on file  . Years of education: Not on file  . Highest education level: Not on file  Occupational History  . Not on file  Tobacco Use  . Smoking status: Former Smoker    Packs/day: 1.00    Years: 30.00    Pack years: 30.00    Types: Cigarettes    Quit date: 11/13/1994    Years since quitting: 25.0  . Smokeless tobacco: Never Used  Substance and Sexual Activity  . Alcohol use: No  . Drug use: No  . Sexual activity: Not Currently  Other Topics Concern  . Not on file  Social History Narrative  . Not on file   Social Determinants of Health   Financial Resource Strain:   . Difficulty of Paying Living Expenses: Not on file  Food Insecurity:   . Worried About Programme researcher, broadcasting/film/video in the Last Year: Not on file  . Ran Out of Food in the Last Year: Not on file  Transportation Needs:   . Lack of Transportation (Medical): Not on file  . Lack of Transportation (Non-Medical): Not on file  Physical Activity:   . Days of Exercise per Week: Not on file  . Minutes of Exercise per Session: Not on file  Stress:   . Feeling of Stress : Not on file  Social Connections:   . Frequency of Communication with Friends  and Family: Not on file  . Frequency of Social Gatherings with Friends and Family: Not on file  . Attends Religious Services: Not on file  . Active Member of Clubs or Organizations: Not on file  . Attends Banker Meetings: Not on file  . Marital Status: Not on file  Intimate Partner Violence:   . Fear of Current or Ex-Partner: Not on file  . Emotionally Abused: Not on file  . Physically Abused: Not on file  . Sexually Abused: Not on file     Vitals:   11/20/19 0915  BP: 126/70  Pulse: 95  SpO2: 96%  Weight: 246 lb 12.8 oz (111.9 kg)  Height: 5' 10.5" (1.791 m)    Wt Readings from Last 3 Encounters:  11/20/19 246 lb 12.8 oz (111.9 kg)  11/16/19 238 lb (108 kg)  07/22/19 240 lb (108.9 kg)     PHYSICAL EXAM General: NAD HEENT: Normal. Neck: No JVD, no thyromegaly. Lungs: Clear to auscultation bilaterally with normal respiratory effort. CV: Regular rate and rhythm, normal S1/S2, no S3/S4, no murmur. No pretibial or periankle edema.  No carotid bruit.   Abdomen: Soft, nontender, no distention.  Neurologic: Alert and oriented.  Psych: Normal affect. Skin: Normal. Musculoskeletal: No gross deformities.      Labs: Lab Results  Component Value Date/Time   K 3.1 (L) 11/16/2019 09:13 PM   BUN 16 11/16/2019 09:13 PM   CREATININE 1.18 11/16/2019 09:13 PM   TSH 3.313 11/26/2018 06:16 AM   TSH 1.117 09/09/2011 07:00 AM   HGB 10.9 (L) 11/16/2019 09:13 PM     Lipids: Lab Results  Component Value Date/Time   LDLCALC 3 11/27/2018 05:11 AM   CHOL 104 11/27/2018 05:11 AM   TRIG 369 (H) 11/27/2018 05:11 AM   HDL 27 (L) 11/27/2018 05:11 AM       ASSESSMENT AND PLAN:  1. Coronary artery disease: Symptomatically stable. Nonobstructive disease by cardiac catheterization in October 2012. Nuclear stress test in January 2020 showed no ischemic defects. Continue aspirin, Imdur, carvedilol, and atorvastatin.  2. Chronic diastolic heart failure: Euvolemic.  Continue spironolactone.  I will discontinue Lasix as he is taking 40 mg twice daily.  He will continue over-the-counter potassium supplementation for 2 more weeks and then stop.  I will check a basic metabolic panel in 1 month.  3. Palpitations:  Continue carvedilol 12.5 mg twice daily.  Symptomatically stable.  4. Hypertension:  Blood pressure is normal.  Continue present therapy.  5. Thoracic aortic aneurysm: 4.1 cm by CT angiography in January 2020. Stable.  6.  Hypokalemia:  I will discontinue Lasix as he is taking 40 mg twice daily.  He will continue over-the-counter potassium supplementation for 2 more weeks and then stop.  I will check a basic metabolic panel in 1 month.    Disposition: Follow up virtual visit 2 months  Time spent: 40 minutes, of which greater than 50% was spent reviewing symptoms, relevant blood tests and studies, and discussing management plan with the patient.    Kate Sable, M.D., F.A.C.C.

## 2019-11-20 NOTE — Telephone Encounter (Signed)
°*  STAT* If patient is at the pharmacy, call can be transferred to refill team. ° ° °1. Which medications need to be refilled? nitroGLYCERIN (NITROSTAT) 0.4 MG SL tablet  ° ° °2. Which pharmacy/location (including street and city if local pharmacy) is medication to be sent to? [facility] ° °3. Do they need a 30 day or 90 day supply?  ° °

## 2019-11-20 NOTE — Patient Instructions (Signed)
Medication Instructions:   Stop Furosemide (Lasix).  Continue the Potassium for 2 more weeks, then STOP.   Continue all other medications.    Labwork:  BMET - order given today.  He will do lab at the [facility] Department.  Please do in 1 month, around 12/21/2019.  Office will contact with results via phone or letter.    Testing/Procedures: none  Follow-Up: 2-3 months   Any Other Special Instructions Will Be Listed Below (If Applicable).  If you need a refill on your cardiac medications before your next appointment, please call your pharmacy.

## 2019-11-20 NOTE — Telephone Encounter (Signed)
Medication sent to pharmacy  

## 2019-12-05 ENCOUNTER — Other Ambulatory Visit: Payer: Self-pay | Admitting: Cardiovascular Disease

## 2020-01-03 ENCOUNTER — Other Ambulatory Visit: Payer: Self-pay | Admitting: Cardiovascular Disease

## 2020-01-05 ENCOUNTER — Other Ambulatory Visit: Payer: Self-pay | Admitting: *Deleted

## 2020-01-05 MED ORDER — CARVEDILOL 12.5 MG PO TABS: 12.5000 mg | ORAL_TABLET | Freq: Two times a day (BID) | ORAL | 3 refills | Status: DC

## 2020-02-02 ENCOUNTER — Telehealth (INDEPENDENT_AMBULATORY_CARE_PROVIDER_SITE_OTHER): Payer: Medicaid Other | Admitting: Cardiovascular Disease

## 2020-02-02 ENCOUNTER — Encounter: Payer: Self-pay | Admitting: Cardiovascular Disease

## 2020-02-02 VITALS — Ht 70.5 in | Wt 235.0 lb

## 2020-02-02 DIAGNOSIS — I10 Essential (primary) hypertension: Secondary | ICD-10-CM

## 2020-02-02 DIAGNOSIS — I5032 Chronic diastolic (congestive) heart failure: Secondary | ICD-10-CM | POA: Diagnosis not present

## 2020-02-02 DIAGNOSIS — I110 Hypertensive heart disease with heart failure: Secondary | ICD-10-CM

## 2020-02-02 DIAGNOSIS — I712 Thoracic aortic aneurysm, without rupture, unspecified: Secondary | ICD-10-CM

## 2020-02-02 DIAGNOSIS — I25118 Atherosclerotic heart disease of native coronary artery with other forms of angina pectoris: Secondary | ICD-10-CM

## 2020-02-02 DIAGNOSIS — R002 Palpitations: Secondary | ICD-10-CM

## 2020-02-02 NOTE — Progress Notes (Signed)
Virtual Visit via Telephone Note   This visit type was conducted due to national recommendations for restrictions regarding the COVID-19 Pandemic (e.g. social distancing) in an effort to limit this patient's exposure and mitigate transmission in our community.  Due to his co-morbid illnesses, this patient is at least at moderate risk for complications without adequate follow up.  This format is felt to be most appropriate for this patient at this time.  The patient did not have access to video technology/had technical difficulties with video requiring transitioning to audio format only (telephone).  All issues noted in this document were discussed and addressed.  No physical exam could be performed with this format.  Please refer to the patient's chart for his  consent to telehealth for [facility].   The patient was identified using 2 identifiers.  Date:  02/02/2020   ID:  Matthew Cain, DOB 11/24/1955, MRN [id number]  Patient Location: Home Provider Location: Office  PCP:  Reather Converse, PA-C  Cardiologist:  Prentice Docker, MD  Electrophysiologist:  None   Evaluation Performed:  Follow-Up Visit  Chief Complaint:  CAD  History of Present Illness:    Matthew Cain is a 64 y.o. male with coronary artery disease, palpitations, and chronic diastolic heart failure.  In summary, hewas evaluated for chest pain while hospitalized in January 2020. Troponins were normal. Blood pressure had been elevated. Echocardiogram demonstrated normal left ventricular systolic function, LVEF 60 to 24%. There was severe focal basal septal hypertrophy and otherwise moderate hypertrophy of remainingmyocardiumwith grade 1 diastolic dysfunction..  Nuclear stress test on 11/27/2018 showed variable soft tissue attenuation with no definitive ischemic defects. It was deemed a low risk study.  CT angiography of the chest on 11/16/2018 showed maximal ascending aortic diameter of 4.1 cm.  Coronary  angiographyon 09/09/2011 demonstrated mild nonobstructive coronary artery disease.  The patient denies any symptoms of chest pain, palpitations, shortness of breath, lightheadedness, dizziness, leg swelling, orthopnea, PND, and syncope.  He got his first dose of the COVID-19 vaccine.  The patient does not have symptoms concerning for COVID-19 infection (fever, chills, cough, or new shortness of breath).    Soc Hx:He enjoys working on cars and doing body work. He used to sell cars as well.His son lives in [place] and graduated from [address]. His daughter, Matthew Cain, lives in [place] and also graduated from [employer]. She used to be a Biochemist, clinical for the [employer] and now is an Print production planner with [employer] and also teaches dance and is engaged to [place], a Programme researcher, broadcasting/film/video in [place].   Past Medical History:  Diagnosis Date  . Amputation finger    left ring finger  . Anxiety   . CAD (coronary artery disease)    LHC 10/12:  mild plaque in the LAD but no obstructive CAD  . CHF (congestive heart failure) (HCC)   . Chronic diastolic heart failure (HCC)    EF 55-60%, grade 1 diastolic dysfunction; no WMAs, echo, 11/2011; s/p pulmo. edema c/b VDRF 11/12; Echo 09/08/11: severe LVH, no SAM, no LVOT gradient, EF 55%, mild BAE, mild reduced RVSF.  [name] CKD (chronic kidney disease)   . Constipation    "due to medication"  . Depression   . Diabetes mellitus    A1C 7.3 08/2011  . GERD (gastroesophageal reflux disease)   . History of kidney stones   . History of right ACA stroke    10/12  --  MRI 09/13/11:  Acute right ACA infarct involving corpus callosum.  MRA 09/13/11: severe intracranial ASD, right ACA occluded, left PCA and right MCA with severe disease.  Dopplers were neg for significant ICA stenosis  . History of transfusion   . HLD (hyperlipidemia)   . Hypertension   . Myocardial infarction ([place]) 07/2011  . Renal insufficiency   . Shortness of breath dyspnea    with  exertion  . Spermatocele   . Thoracic aortic aneurysm ([place])    CT in 10/12: 4.4 cm ascending thoracic aortic aneurysm  . Tongue tied    "at times I get tongue tied since the stroke"   Past Surgical History:  Procedure Laterality Date  .  vasectomy     bilateral  . CARDIAC CATHETERIZATION  09/09/11  . ROTATOR CUFF REPAIR  07/2015   LEFT  . SPERMATOCELECTOMY  1990's   RT side  . SPERMATOCELECTOMY Left 07/20/2015   Procedure: LEFT HYDROCELECTOMY;  Surgeon: Irine Seal, MD;  Location: [facility];  Service: Urology;  Laterality: Left;  . TRANSURETHRAL INCISION OF PROSTATE N/A 07/20/2015   Procedure: TRANSURETHRAL INCISION OF THE PROSTATE (TUIP);  Surgeon: Irine Seal, MD;  Location: [facility];  Service: Urology;  Laterality: N/A;     Current Meds  Medication Sig  . amitriptyline (ELAVIL) 10 MG tablet Take 20 mg by mouth at bedtime.  [name] amLODipine (NORVASC) 10 MG tablet Take 1 tablet (10 mg total) by mouth daily. (Patient taking differently: Take 10 mg by mouth every morning. )  . Ascorbic Acid (VITAMIN C) 1000 MG tablet Take 1,000 mg by mouth daily.  [name] aspirin EC 81 MG tablet Take 81 mg by mouth daily.  [name] atorvastatin (LIPITOR) 80 MG tablet Take 80 mg by mouth daily.  . busPIRone (BUSPAR) 5 MG tablet Take 5 mg by mouth 2 (two) times daily.   . carvedilol (COREG) 12.5 MG tablet Take 1 tablet (12.5 mg total) by mouth 2 (two) times daily.  . Cholecalciferol (VITAMIN D3) 125 MCG (5000 UT) CAPS Take 1 capsule by mouth daily.  . DULoxetine (CYMBALTA) 60 MG capsule Take 1 capsule by mouth daily.  . famotidine (PEPCID) 40 MG tablet Take 40 mg by mouth daily.  . ferrous sulfate (IRON SUPPLEMENT) 325 (65 FE) MG tablet Take 325 mg by mouth at bedtime.   . fluticasone (FLONASE) 50 MCG/ACT nasal spray Place 1 spray into both nostrils daily.  [name] ibuprofen (ADVIL,MOTRIN) 800 MG tablet Take 400-800 mg by mouth every 8 (eight) hours as needed for pain.   . Insulin Glargine (LANTUS SOLOSTAR) 100 UNIT/ML Solostar Pen  Inject 55 Units into the skin 2 (two) times daily.   . isosorbide mononitrate (IMDUR) 30 MG 24 hr tablet Take 1 tablet by mouth once daily  . lisinopril (PRINIVIL,ZESTRIL) 40 MG tablet Take 1 tablet (40 mg total) by mouth daily.  [name] loratadine (CLARITIN) 10 MG tablet Take 10 mg by mouth daily.  . metFORMIN (GLUCOPHAGE) 1000 MG tablet Take 1,000 mg by mouth 2 (two) times daily.  . nitroGLYCERIN (NITROSTAT) 0.4 MG SL tablet Place 1 tablet (0.4 mg total) under the tongue every 5 (five) minutes as needed for chest pain.  . polyethylene glycol powder (GLYCOLAX/MIRALAX) powder Take 17 g by mouth daily.  [name] spironolactone (ALDACTONE) 25 MG tablet Take 1 tablet by mouth once daily  . tetrahydrozoline (EYE DROPS) 0.05 % ophthalmic solution Place 2 drops into both eyes daily as needed.     Allergies:   Tomato   Social History   Tobacco Use  . Smoking status: Former  Smoker    Packs/day: 1.00    Years: 30.00    Pack years: 30.00    Types: Cigarettes    Quit date: 11/13/1994    Years since quitting: 25.2  . Smokeless tobacco: Never Used  Substance Use Topics  . Alcohol use: No  . Drug use: No     Family Hx: The patient's family history includes Cerebral aneurysm in his father and another family member; Diabetes in an other family member; Heart attack in his mother; Hypertension in an other family member.  ROS:   Please see the history of present illness.     All other systems reviewed and are negative.   Prior CV studies:   The following studies were reviewed today:  Reviewed above  Labs/Other Tests and Data Reviewed:    EKG:  No ECG reviewed.  Recent Labs: 11/16/2019: BUN 16; Creatinine, Ser 1.18; Hemoglobin 10.9; Platelets 412; Potassium 3.1; Sodium 137   Recent Lipid Panel Lab Results  Component Value Date/Time   CHOL 104 11/27/2018 05:11 AM   TRIG 369 (H) 11/27/2018 05:11 AM   HDL 27 (L) 11/27/2018 05:11 AM   CHOLHDL 3.9 11/27/2018 05:11 AM   LDLCALC 3 11/27/2018 05:11 AM     Wt Readings from Last 3 Encounters:  02/02/20 235 lb (106.6 kg)  11/20/19 246 lb 12.8 oz (111.9 kg)  11/16/19 238 lb (108 kg)     Objective:    Vital Signs:  Ht 5' 10.5" (1.791 m)   Wt 235 lb (106.6 kg)   BMI 33.24 kg/m    VITAL SIGNS:  reviewed  ASSESSMENT & PLAN:    1. Coronary artery disease: Symptomatically stable. Nonobstructive disease by cardiac catheterization in October 2012. Nuclear stress test in January 2020 showed no ischemic defects. Continue aspirin, Imdur,carvedilol,and atorvastatin.  2. Chronic diastolic heart failure: Euvolemic. Continue spironolactone.   3. Palpitations: Continue carvedilol 12.5 mg twice daily.  Symptomatically stable.  4. Hypertension:Continue present therapy.  5. Thoracic aortic aneurysm: 4.1 cm by CT angiography in January 2020. Stable.    COVID-19 Education: The signs and symptoms of COVID-19 were discussed with the patient and how to seek care for testing (follow up with PCP or arrange E-visit).  The importance of social distancing was discussed today.  Time:   Today, I have spent 20 minutes with the patient with telehealth technology discussing the above problems.     Medication Adjustments/Labs and Tests Ordered: Current medicines are reviewed at length with the patient today.  Concerns regarding medicines are outlined above.   Tests Ordered: No orders of the defined types were placed in this encounter.   Medication Changes: No orders of the defined types were placed in this encounter.   Follow Up:  In Person in 6 month(s)  Signed, Prentice Docker, MD  02/02/2020 8:19 AM    [facility]

## 2020-02-02 NOTE — Patient Instructions (Signed)
Medication Instructions:  Continue all current medications.   Labwork: none  Testing/Procedures: none  Follow-Up: 6 months   Any Other Special Instructions Will Be Listed Below (If Applicable).   If you need a refill on your cardiac medications before your next appointment, please call your pharmacy.  

## 2020-06-07 ENCOUNTER — Other Ambulatory Visit: Payer: Self-pay

## 2020-06-07 MED ORDER — ISOSORBIDE MONONITRATE ER 30 MG PO TB24: 30.0000 mg | ORAL_TABLET | Freq: Every day | ORAL | 2 refills | Status: DC

## 2020-06-21 ENCOUNTER — Other Ambulatory Visit: Payer: Self-pay | Admitting: *Deleted

## 2020-06-21 MED ORDER — SPIRONOLACTONE 25 MG PO TABS: 25.0000 mg | ORAL_TABLET | Freq: Every day | ORAL | 2 refills | Status: DC

## 2020-07-08 ENCOUNTER — Encounter: Payer: Self-pay | Admitting: Cardiology

## 2020-07-08 DIAGNOSIS — Z7189 Other specified counseling: Secondary | ICD-10-CM | POA: Insufficient documentation

## 2020-07-08 NOTE — Progress Notes (Signed)
Cardiology Office Note   Date:  07/09/2020   ID:  Matthew Cain, DOB 30-Jan-1956, MRN [id number]  PCP:  Reather Converse, PA-C  Cardiologist:   Rollene Rotunda, MD   Chief Complaint  Patient presents with  . Palpitations      History of Present Illness: Matthew Cain is a 64 y.o. male who presents for follow up of coronary artery disease, palpitations, and chronic diastolic heart failure.  He was previously seen by Dr. Purvis Sheffield.  He has had chest pain with mild CAD on cath in 08/2011.  He had a low risk scan with no ischemia in January 2020.  He has a normal EF on echo last year with severe septal hypertrophy and grade one diastolic dysfunction.   He also has a slightly enlarged aortic root.  He has been having meds adjusted for evaluation of palpitations.  Interestingly he was on acebutolol previously and has now been switched to carvedilol.  He thinks he feels his heart pounding more than he used to.  He is not describing rapid heart rates.  He is not having any presyncope or syncope.  He has some chronic shortness of breath.  He is not describing any PND or orthopnea.  He has no change in his shortness of breath.  He has no chest pressure, neck or arm discomfort.  He has no weight gain or edema.  Past Medical History:  Diagnosis Date  . Amputation finger    left ring finger  . Anxiety   . CAD (coronary artery disease)    LHC 10/12:  mild plaque in the LAD but no obstructive CAD  . Chronic diastolic heart failure (HCC)    EF 55-60%, grade 1 diastolic dysfunction; no WMAs, echo, 11/2011; s/p pulmo. edema c/b VDRF 11/12; Echo 09/08/11: severe LVH, no SAM, no LVOT gradient, EF 55%, mild BAE, mild reduced RVSF.  [name] CKD (chronic kidney disease)   . Depression   . Diabetes mellitus    A1C 7.3 08/2011  . GERD (gastroesophageal reflux disease)   . History of kidney stones   . History of right ACA stroke    10/12  --  MRI 09/13/11:  Acute right ACA infarct involving corpus callosum.  MRA  09/13/11: severe intracranial ASD, right ACA occluded, left PCA and right MCA with severe disease.  Dopplers were neg for significant ICA stenosis  . HLD (hyperlipidemia)   . Hypertension   . Renal insufficiency   . Spermatocele   . Thoracic aortic aneurysm (HCC)    CT in 10/12: 4.4 cm ascending thoracic aortic aneurysm    Past Surgical History:  Procedure Laterality Date  .  vasectomy     bilateral  . CARDIAC CATHETERIZATION  09/09/11  . ROTATOR CUFF REPAIR  07/2015   LEFT  . SPERMATOCELECTOMY  1990's   RT side  . SPERMATOCELECTOMY Left 07/20/2015   Procedure: LEFT HYDROCELECTOMY;  Surgeon: Bjorn Pippin, MD;  Location: [facility];  Service: Urology;  Laterality: Left;  . TRANSURETHRAL INCISION OF PROSTATE N/A 07/20/2015   Procedure: TRANSURETHRAL INCISION OF THE PROSTATE (TUIP);  Surgeon: Bjorn Pippin, MD;  Location: [facility];  Service: Urology;  Laterality: N/A;     Current Outpatient Medications  Medication Sig Dispense Refill  . amitriptyline (ELAVIL) 10 MG tablet Take 20 mg by mouth at bedtime.    [name] amLODipine (NORVASC) 10 MG tablet Take 1 tablet (10 mg total) by mouth daily. (Patient taking differently: Take 10 mg by mouth every morning. ) 30  tablet 6  . Ascorbic Acid (VITAMIN C) 1000 MG tablet Take 1,000 mg by mouth daily.    [name] aspirin EC 81 MG tablet Take 81 mg by mouth daily.    [name] atorvastatin (LIPITOR) 80 MG tablet Take 80 mg by mouth daily.    . busPIRone (BUSPAR) 5 MG tablet Take 5 mg by mouth 2 (two) times daily.     . Cholecalciferol (VITAMIN D3) 125 MCG (5000 UT) CAPS Take 1 capsule by mouth daily.    . DULoxetine (CYMBALTA) 60 MG capsule Take 1 capsule by mouth daily.    . famotidine (PEPCID) 40 MG tablet Take 40 mg by mouth daily.    . ferrous sulfate (IRON SUPPLEMENT) 325 (65 FE) MG tablet Take 325 mg by mouth at bedtime.     . fluticasone (FLONASE) 50 MCG/ACT nasal spray Place 1 spray into both nostrils daily.    [name] ibuprofen (ADVIL,MOTRIN) 800 MG tablet Take 400-800 mg by  mouth every 8 (eight) hours as needed for pain.     . Insulin Glargine (LANTUS SOLOSTAR) 100 UNIT/ML Solostar Pen Inject 55 Units into the skin 2 (two) times daily.     . isosorbide mononitrate (IMDUR) 30 MG 24 hr tablet Take 1 tablet (30 mg total) by mouth daily. 90 tablet 2  . lisinopril (PRINIVIL,ZESTRIL) 40 MG tablet Take 1 tablet (40 mg total) by mouth daily. 90 tablet 3  . loratadine (CLARITIN) 10 MG tablet Take 10 mg by mouth daily.    . meloxicam (MOBIC) 15 MG tablet Take 15 mg by mouth daily.    . metFORMIN (GLUCOPHAGE) 1000 MG tablet Take 1,000 mg by mouth 2 (two) times daily.    . montelukast (SINGULAIR) 10 MG tablet Take 10 mg by mouth at bedtime.    . nitroGLYCERIN (NITROSTAT) 0.4 MG SL tablet Place 1 tablet (0.4 mg total) under the tongue every 5 (five) minutes as needed for chest pain. 25 tablet 3  . Omega-3 Fatty Acids (FISH OIL) 1000 MG CAPS Take 1 capsule by mouth in the morning and at bedtime.    . polyethylene glycol powder (GLYCOLAX/MIRALAX) powder Take 17 g by mouth daily.    [name] spironolactone (ALDACTONE) 25 MG tablet Take 1 tablet (25 mg total) by mouth daily. 90 tablet 2  . tetrahydrozoline (EYE DROPS) 0.05 % ophthalmic solution Place 2 drops into both eyes daily as needed.    . carvedilol (COREG) 25 MG tablet Take 1 tablet (25 mg total) by mouth 2 (two) times daily. 180 tablet 1   No current facility-administered medications for this visit.    Allergies:   Tomato    ROS:  Please see the history of present illness.   Otherwise, review of systems are positive for none.   All other systems are reviewed and negative.    PHYSICAL EXAM: VS:  BP 130/72   Pulse 94   Ht 5' 10.5" (1.791 m)   Wt 239 lb 12.8 oz (108.8 kg)   SpO2 98%   BMI 33.92 kg/m  , BMI Body mass index is 33.92 kg/m. GENERAL:  Well appearing NECK:  No jugular venous distention, waveform within normal limits, carotid upstroke brisk and symmetric, no bruits, no thyromegaly LUNGS:  Clear to auscultation  bilaterally CHEST:  Unremarkable HEART:  PMI not displaced or sustained,S1 and S2 within normal limits, no S3, no S4, no clicks, no rubs, no systolic murmurs even with Valsalva, no diastolic murmurs ABD:  Flat, positive bowel sounds normal in frequency in  pitch, no bruits, no rebound, no guarding, no midline pulsatile mass, no hepatomegaly, no splenomegaly EXT:  2 plus pulses throughout, no edema, no cyanosis no clubbing  EKG:  EKG is not ordered today.   Recent Labs: 11/16/2019: BUN 16; Creatinine, Ser 1.18; Hemoglobin 10.9; Platelets 412; Potassium 3.1; Sodium 137    Lipid Panel    Component Value Date/Time   CHOL 104 11/27/2018 0511   TRIG 369 (H) 11/27/2018 0511   HDL 27 (L) 11/27/2018 0511   CHOLHDL 3.9 11/27/2018 0511   VLDL 74 (H) 11/27/2018 0511   LDLCALC 3 11/27/2018 0511      Wt Readings from Last 3 Encounters:  07/09/20 239 lb 12.8 oz (108.8 kg)  02/02/20 235 lb (106.6 kg)  11/20/19 246 lb 12.8 oz (111.9 kg)      Other studies Reviewed: Additional studies/ records that were reviewed today include: None. Review of the above records demonstrates:  Please see elsewhere in the note.     ASSESSMENT AND PLAN:  Asymmetric Septal Hypertrophy:    Interestingly the patient has a family history of his mother dying suddenly at age 47 he said from a heart attack.  He said there was an autopsy but he never had the details.  There is no other family history of sudden cardiac death or asymmetric septal hypertrophy.  I am going to start with an MRI.  He will also likely need genetic testing if this study is confirmatory of septal hypertrophy.  I am going to continue to titrate his beta-blocker.  I do not see any evidence of an outflow gradient but I do not see that his echo was with Valsalva in the past.  He did not have a murmur with Valsalva today.  I do not think he is having overt symptoms related to the septal hypertrophy but might manage him with meds other than afterload  reduction going forward.  For now I will increase his carvedilol to 25 mg twice daily.  Coronary artery disease:   He had nonobstructive disease and previous stress test without high risk for findings.  No change in therapy.   Chronic diastolic heart failure:   Seems to be euvolemic.  He will continue the meds as listed.  I will not titrate the beta-blocker and consider switching to a different calcium channel blocker and stopping his ACE inhibitor in the future.    Palpitations:This was managed with the beta-blocker as above.  Hypertension:  I will be making med changes going forward as above.   Thoracic aortic aneurysm: 4.1 cm by CT angiography in January 2020.  This will also be assessed at the time of his MRI.  Covid education: He has had his vaccine.   Current medicines are reviewed at length with the patient today.  The patient does not have concerns regarding medicines.  The following changes have been made: As above  Labs/ tests ordered today include:   Orders Placed This Encounter  Procedures  . MR CARDIAC MORPHOLOGY W WO CONTRAST     Disposition:   FU with me after the MRI.     Signed, Rollene Rotunda, MD  07/09/2020 5:02 PM    [facility]

## 2020-07-09 ENCOUNTER — Encounter: Payer: Self-pay | Admitting: *Deleted

## 2020-07-09 ENCOUNTER — Encounter: Payer: Self-pay | Admitting: Cardiology

## 2020-07-09 ENCOUNTER — Other Ambulatory Visit: Payer: Self-pay

## 2020-07-09 ENCOUNTER — Ambulatory Visit: Payer: Medicaid Other | Admitting: Cardiology

## 2020-07-09 VITALS — BP 130/72 | HR 94 | Ht 70.5 in | Wt 239.8 lb

## 2020-07-09 DIAGNOSIS — R002 Palpitations: Secondary | ICD-10-CM | POA: Diagnosis not present

## 2020-07-09 DIAGNOSIS — Z7189 Other specified counseling: Secondary | ICD-10-CM

## 2020-07-09 DIAGNOSIS — I10 Essential (primary) hypertension: Secondary | ICD-10-CM

## 2020-07-09 DIAGNOSIS — I422 Other hypertrophic cardiomyopathy: Secondary | ICD-10-CM

## 2020-07-09 DIAGNOSIS — I5032 Chronic diastolic (congestive) heart failure: Secondary | ICD-10-CM

## 2020-07-09 DIAGNOSIS — I2510 Atherosclerotic heart disease of native coronary artery without angina pectoris: Secondary | ICD-10-CM | POA: Diagnosis not present

## 2020-07-09 DIAGNOSIS — I712 Thoracic aortic aneurysm, without rupture, unspecified: Secondary | ICD-10-CM

## 2020-07-09 DIAGNOSIS — I421 Obstructive hypertrophic cardiomyopathy: Secondary | ICD-10-CM

## 2020-07-09 MED ORDER — CARVEDILOL 25 MG PO TABS: 25.0000 mg | ORAL_TABLET | Freq: Two times a day (BID) | ORAL | 1 refills | Status: DC

## 2020-07-09 NOTE — Patient Instructions (Signed)
Your physician recommends that you schedule a follow-up appointment in: 1 MONTH WITH DR [facility] IN [place]  Your physician has recommended you make the following change in your medication:   INCREASE COREG 25 MG TWICE DAILY    Your physician has requested that you have a cardiac MRI. Cardiac MRI uses a computer to create images of your heart as its beating, producing both still and moving pictures of your heart and major blood vessels. For further information please visit [email or web address]. Please follow the instruction sheet given to you today for more information.  Thank you for choosing [facility]!!

## 2020-07-12 ENCOUNTER — Telehealth: Payer: Self-pay | Admitting: Cardiology

## 2020-07-12 NOTE — Telephone Encounter (Signed)
Spoke with patient regarding preferred weekdays and times for the Cardiac MRI ordered by Dr. Antoine Poche

## 2020-07-15 ENCOUNTER — Encounter: Payer: Self-pay | Admitting: Cardiology

## 2020-07-15 NOTE — Telephone Encounter (Signed)
Spoke with patient regarding appointment for Cardiac MRI scheduled Friday 08/20/20 at 8:00 am at [facility]---arrival time is 7:30 am 1st floor admissions office.  Will mail information to patient---he voiced his understanding.

## 2020-08-06 ENCOUNTER — Ambulatory Visit: Payer: Medicaid Other | Admitting: Cardiovascular Disease

## 2020-08-19 ENCOUNTER — Telehealth: Payer: Self-pay | Admitting: Cardiology

## 2020-08-19 NOTE — Telephone Encounter (Signed)
Spoke with patient regarding prior authorization for Cardiac MRI scheduled Friday 08/20/20 at 8:00 am at [facility]----Authorization is still pending---will call patient to reschedule when we have the prior authorization.

## 2020-08-20 ENCOUNTER — Ambulatory Visit (HOSPITAL_COMMUNITY): Admission: RE | Admit: 2020-08-20 | Payer: Medicaid Other | Source: Ambulatory Visit

## 2020-08-25 ENCOUNTER — Telehealth (HOSPITAL_COMMUNITY): Payer: Self-pay | Admitting: Emergency Medicine

## 2020-08-25 NOTE — Telephone Encounter (Signed)
Spoke with patient regarding appointment for Cardiac MRI scheduled Friday 08/27/20 at 8:00 am at [facility]---arrival time is 7:30 am 1st floor admissions office for check in.  Patient voiced his understanding.

## 2020-08-25 NOTE — Telephone Encounter (Signed)
Reaching out to patient to offer assistance regarding upcoming cardiac imaging study; pt verbalizes understanding of appt date/time, parking situation and where to check in, pre-test NPO status and medications ordered, and verified current allergies; name and call back number provided for further questions should they arise Rockwell Alexandria RN Navigator Cardiac Imaging [facility] [phone] office [phone] cell  Pt reports small piece of metal in hand from working on car; states has had MRI since.  Pt also reporting some claustrophobia, requesting anti-anxiety medication for fridays test.  [place] Dec

## 2020-08-26 ENCOUNTER — Ambulatory Visit (HOSPITAL_COMMUNITY): Admission: RE | Admit: 2020-08-26 | Payer: Medicaid Other | Source: Ambulatory Visit

## 2020-08-26 NOTE — Telephone Encounter (Signed)
Can take valium 2 mg po 30 - 60 minutes before the procedure.  Dispense number one pill with no refills.

## 2020-08-27 ENCOUNTER — Other Ambulatory Visit: Payer: Self-pay

## 2020-08-27 ENCOUNTER — Ambulatory Visit (HOSPITAL_COMMUNITY)
Admission: RE | Admit: 2020-08-27 | Discharge: 2020-08-27 | Disposition: A | Discharge status: Home / Self Care | Payer: Medicaid Other | Source: Ambulatory Visit | Attending: Cardiology | Admitting: Cardiology

## 2020-08-27 DIAGNOSIS — I421 Obstructive hypertrophic cardiomyopathy: Secondary | ICD-10-CM

## 2020-08-27 MED ORDER — GADOBUTROL 1 MMOL/ML IV SOLN
10.0000 mL | Freq: Once | INTRAVENOUS | Status: AC | PRN
  Administered 2020-08-27: 10 mL via INTRAVENOUS

## 2020-09-01 NOTE — Telephone Encounter (Signed)
Patient had MRI 08/27/2020

## 2020-09-06 DIAGNOSIS — I119 Hypertensive heart disease without heart failure: Secondary | ICD-10-CM | POA: Insufficient documentation

## 2020-09-06 NOTE — Progress Notes (Signed)
Cardiology Office Note   Date:  09/08/2020   ID:  Matthew Cain, DOB 01-Mar-1956, MRN [id number]  PCP:  Suzan Slick, MD  Cardiologist:   Rollene Rotunda, MD   Chief Complaint  Patient presents with  . Palpitations      History of Present Illness: Matthew Cain is a 64 y.o. male who presents for follow up of coronary artery disease, palpitations, and chronic diastolic heart failure.  He was previously seen by Dr. Purvis Sheffield.  He has had chest pain with mild CAD on cath in 08/2011.  He had a low risk scan with no ischemia in January 2020.  He has a normal EF on echo last year with severe septal hypertrophy and grade one diastolic dysfunction.   He also has a slightly enlarged aortic root.  After my first visit with him I sent him for an MRI.  This did not suggest septal hypertrophy.  At the last visit increased his beta blocker.     He says he did very well with the increase beta-blocker.  He has very few palpitations.  He says his blood pressure has been in the 120s over 80s.  He has very few palpitations and no presyncope or syncope.  He has fleeting chest discomfort and he has taken nitroglycerin but this is not a pattern and has been changed since his last negative perfusion study last year.  He does get a little winded climbing stairs but this is baseline.  He has no increased symptoms.  He is not having any PND or orthopnea.  He is not having any neck or arm discomfort.  He has had no associated nausea vomiting or diaphoresis.  He is not as physically active as I would like.  Past Medical History:  Diagnosis Date  . Amputation finger    left ring finger  . Anxiety   . CAD (coronary artery disease)    LHC 10/12:  mild plaque in the LAD but no obstructive CAD  . Chronic diastolic heart failure (HCC)    EF 55-60%, grade 1 diastolic dysfunction; no WMAs, echo, 11/2011; s/p pulmo. edema c/b VDRF 11/12; Echo 09/08/11: severe LVH, no SAM, no LVOT gradient, EF 55%, mild BAE, mild reduced  RVSF.  [name] CKD (chronic kidney disease)   . Depression   . Diabetes mellitus    A1C 7.3 08/2011  . GERD (gastroesophageal reflux disease)   . History of kidney stones   . History of right ACA stroke    10/12  --  MRI 09/13/11:  Acute right ACA infarct involving corpus callosum.  MRA 09/13/11: severe intracranial ASD, right ACA occluded, left PCA and right MCA with severe disease.  Dopplers were neg for significant ICA stenosis  . HLD (hyperlipidemia)   . Hypertension   . Renal insufficiency   . Spermatocele   . Thoracic aortic aneurysm (HCC)    CT in 10/12: 4.4 cm ascending thoracic aortic aneurysm    Past Surgical History:  Procedure Laterality Date  .  vasectomy     bilateral  . CARDIAC CATHETERIZATION  09/09/11  . ROTATOR CUFF REPAIR  07/2015   LEFT  . SPERMATOCELECTOMY  1990's   RT side  . SPERMATOCELECTOMY Left 07/20/2015   Procedure: LEFT HYDROCELECTOMY;  Surgeon: Bjorn Pippin, MD;  Location: [facility];  Service: Urology;  Laterality: Left;  . TRANSURETHRAL INCISION OF PROSTATE N/A 07/20/2015   Procedure: TRANSURETHRAL INCISION OF THE PROSTATE (TUIP);  Surgeon: Bjorn Pippin, MD;  Location: [facility];  Service: Urology;  Laterality: N/A;     Current Outpatient Medications  Medication Sig Dispense Refill  . amitriptyline (ELAVIL) 10 MG tablet Take 20 mg by mouth at bedtime.    [name] amLODipine (NORVASC) 10 MG tablet Take 1 tablet (10 mg total) by mouth daily. (Patient taking differently: Take 10 mg by mouth every morning. ) 30 tablet 6  . Ascorbic Acid (VITAMIN C) 1000 MG tablet Take 1,000 mg by mouth daily.    [name] aspirin EC 81 MG tablet Take 81 mg by mouth daily.    [name] atorvastatin (LIPITOR) 80 MG tablet Take 80 mg by mouth daily.    . busPIRone (BUSPAR) 5 MG tablet Take 5 mg by mouth 2 (two) times daily.     . carvedilol (COREG) 25 MG tablet Take 1 tablet (25 mg total) by mouth 2 (two) times daily. 180 tablet 1  . Cholecalciferol (VITAMIN D3) 125 MCG (5000 UT) CAPS Take 1 capsule by mouth  daily.    . DULoxetine (CYMBALTA) 60 MG capsule Take 1 capsule by mouth daily.    . famotidine (PEPCID) 40 MG tablet Take 40 mg by mouth daily.    . ferrous sulfate (IRON SUPPLEMENT) 325 (65 FE) MG tablet Take 325 mg by mouth at bedtime.     . fluticasone (FLONASE) 50 MCG/ACT nasal spray Place 1 spray into both nostrils daily as needed.     [name] ibuprofen (ADVIL,MOTRIN) 800 MG tablet Take 400-800 mg by mouth every 8 (eight) hours as needed for pain.     . Insulin Glargine (LANTUS SOLOSTAR) 100 UNIT/ML Solostar Pen Inject 55 Units into the skin 2 (two) times daily.     . isosorbide mononitrate (IMDUR) 30 MG 24 hr tablet Take 1 tablet (30 mg total) by mouth daily. 90 tablet 2  . lisinopril (PRINIVIL,ZESTRIL) 40 MG tablet Take 1 tablet (40 mg total) by mouth daily. 90 tablet 3  . loratadine (CLARITIN) 10 MG tablet Take 10 mg by mouth daily.    . meloxicam (MOBIC) 15 MG tablet Take 15 mg by mouth daily as needed.     . metFORMIN (GLUCOPHAGE) 1000 MG tablet Take 1,000 mg by mouth 2 (two) times daily.    . montelukast (SINGULAIR) 10 MG tablet Take 10 mg by mouth at bedtime.    . nitroGLYCERIN (NITROSTAT) 0.4 MG SL tablet Place 1 tablet (0.4 mg total) under the tongue every 5 (five) minutes as needed for chest pain. 25 tablet 3  . Omega-3 Fatty Acids (FISH OIL) 1000 MG CAPS Take 1 capsule by mouth in the morning and at bedtime.    [name] spironolactone (ALDACTONE) 25 MG tablet Take 1 tablet (25 mg total) by mouth daily. 90 tablet 2  . tetrahydrozoline (EYE DROPS) 0.05 % ophthalmic solution Place 2 drops into both eyes daily as needed.     No current facility-administered medications for this visit.    Allergies:   Tomato    ROS:  Please see the history of present illness.   Otherwise, review of systems are positive for none.   All other systems are reviewed and negative.    PHYSICAL EXAM: VS:  BP (!) 138/92   Pulse 80   Ht 5' 10.5" (1.791 m)   Wt 241 lb (109.3 kg)   BMI 34.09 kg/m  , BMI Body mass  index is 34.09 kg/m. GENERAL:  Well appearing NECK:  No jugular venous distention, waveform within normal limits, carotid upstroke brisk and symmetric, no bruits, no thyromegaly LUNGS:  Clear to auscultation bilaterally CHEST:  Unremarkable HEART:  PMI not displaced or sustained,S1 and S2 within normal limits, no S3, no S4, no clicks, no rubs,no systolic murmur early peaking and radiating up aortic outflow tract and into the carotids, no diastolic murmurs ABD:  Flat, positive bowel sounds normal in frequency in pitch, no bruits, no rebound, no guarding, no midline pulsatile mass, no hepatomegaly, no splenomegaly EXT:  2 plus pulses throughout, no edema, no cyanosis no clubbing   EKG:  EKG is  ordered today. Sinus rhythm, rate 80, left axis deviation, left anterior fascicular block, interventricular conduction delay, poor anterior R wave progression, no acute ST-T wave changes.  No change from previous.  Recent Labs: 11/16/2019: BUN 16; Creatinine, Ser 1.18; Hemoglobin 10.9; Platelets 412; Potassium 3.1; Sodium 137    Lipid Panel    Component Value Date/Time   CHOL 104 11/27/2018 0511   TRIG 369 (H) 11/27/2018 0511   HDL 27 (L) 11/27/2018 0511   CHOLHDL 3.9 11/27/2018 0511   VLDL 74 (H) 11/27/2018 0511   LDLCALC 3 11/27/2018 0511      Wt Readings from Last 3 Encounters:  09/08/20 241 lb (109.3 kg)  07/09/20 239 lb 12.8 oz (108.8 kg)  02/02/20 235 lb (106.6 kg)      Other studies Reviewed: Additional studies/ records that were reviewed today include: Labs Review of the above records demonstrates:  Please see elsewhere in the note.     ASSESSMENT AND PLAN:  Asymmetric Septal Hypertrophy:    Interestingly the patient has a family history of his mother dying suddenly at age 62 he said from a heart attack.  He said there was an autopsy but he never had the details.  MRI did not suggest septal hypertrophy although the echo was suggestive.  There was no LGE.  Therefore, I do not  think that further genetic testing is indicated although I did suggest that his 59 year old son might be interested in getting an echocardiogram.  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  Coronary artery disease:   He had nonobstructive disease and previous stress test without high risk for findings.  He has no new symptoms suggestive of unstable angina.  No further work-up.  Chronic diastolic heart failure: He is euvolemic.  We talked about salt and volume management.   Palpitations:These are less problematic with the beta-blocker increase.  No change in therapy.  Hypertension:His blood pressure is well controlled at home.  We will continue the meds as listed.  Thoracic aortic aneurysm:  Was 53mm by MRI.  This was stable compared to a previous CT. I will follow this with a CT of his aortic in 1 year.  Other: I do note that he was anemic on the last CBC I saw and I asked him to have this followed by his new primary provider and gave him written instructions for this.  Covid education: He has had his vaccine.   Current medicines are reviewed at length with the patient today.  The patient does not have concerns regarding medicines.  The following changes have been made: None  Labs/ tests ordered today include: None  Orders Placed This Encounter  Procedures  . EKG 12-Lead     Disposition:   FU with me in one year.     Signed, Rollene Rotunda, MD  09/08/2020 10:06 AM    [facility]

## 2020-09-08 ENCOUNTER — Other Ambulatory Visit: Payer: Self-pay

## 2020-09-08 ENCOUNTER — Encounter: Payer: Self-pay | Admitting: Cardiology

## 2020-09-08 ENCOUNTER — Ambulatory Visit (INDEPENDENT_AMBULATORY_CARE_PROVIDER_SITE_OTHER): Payer: Medicaid Other | Admitting: Cardiology

## 2020-09-08 VITALS — BP 138/92 | HR 80 | Ht 70.5 in | Wt 241.0 lb

## 2020-09-08 DIAGNOSIS — I5032 Chronic diastolic (congestive) heart failure: Secondary | ICD-10-CM

## 2020-09-08 DIAGNOSIS — I712 Thoracic aortic aneurysm, without rupture, unspecified: Secondary | ICD-10-CM

## 2020-09-08 DIAGNOSIS — Z01812 Encounter for preprocedural laboratory examination: Secondary | ICD-10-CM

## 2020-09-08 DIAGNOSIS — I2510 Atherosclerotic heart disease of native coronary artery without angina pectoris: Secondary | ICD-10-CM

## 2020-09-08 DIAGNOSIS — R002 Palpitations: Secondary | ICD-10-CM

## 2020-09-08 DIAGNOSIS — I119 Hypertensive heart disease without heart failure: Secondary | ICD-10-CM

## 2020-09-08 DIAGNOSIS — I10 Essential (primary) hypertension: Secondary | ICD-10-CM

## 2020-09-08 NOTE — Patient Instructions (Signed)
Medication Instructions:  The current medical regimen is effective;  continue present plan and medications.  *If you need a refill on your cardiac medications before your next appointment, please call your pharmacy*  Lab Work: Please have a CBC drawn at your primary care Dr's office with results faxed to Dr Antoine Poche [phone]. If you have labs (blood work) drawn today and your tests are completely normal, you will receive your results only by: [name] MyChart Message (if you have MyChart) OR . A paper copy in the mail If you have any lab test that is abnormal or we need to change your treatment, we will call you to review the results.  Testing: Your physician has requested that you have CT scan of your aorta in 1 year (before seeing him back). Cardiac computed tomography (CT) is a painless test that uses an x-ray machine to take clear, detailed pictures of your heart.  This will be scheduled closer to the time you are due.  Follow-Up: At [facility], you and your health needs are our priority.  As part of our continuing mission to provide you with exceptional heart care, we have created designated Provider Care Teams.  These Care Teams include your primary Cardiologist (physician) and Advanced Practice Providers (APPs -  Physician Assistants and Nurse Practitioners) who all work together to provide you with the care you need, when you need it.  We recommend signing up for the patient portal called "MyChart".  Sign up information is provided on this After Visit Summary.  MyChart is used to connect with patients for Virtual Visits (Telemedicine).  Patients are able to view lab/test results, encounter notes, upcoming appointments, etc.  Non-urgent messages can be sent to your provider as well.   To learn more about what you can do with MyChart, go to [email or web address].    Your next appointment:   12 month(s)  The format for your next appointment:   In Person  Provider:   Rollene Rotunda, MD

## 2020-10-06 ENCOUNTER — Other Ambulatory Visit: Payer: Self-pay

## 2020-10-06 ENCOUNTER — Ambulatory Visit (HOSPITAL_COMMUNITY)
Admission: RE | Admit: 2020-10-06 | Discharge: 2020-10-06 | Disposition: A | Discharge status: Home / Self Care | Payer: Medicaid Other | Source: Ambulatory Visit | Attending: Cardiology | Admitting: Cardiology

## 2020-10-06 DIAGNOSIS — I712 Thoracic aortic aneurysm, without rupture, unspecified: Secondary | ICD-10-CM

## 2020-10-06 MED ORDER — IOHEXOL 350 MG/ML SOLN
100.0000 mL | Freq: Once | INTRAVENOUS | Status: AC | PRN
  Administered 2020-10-06: 100 mL via INTRAVENOUS

## 2020-10-07 LAB — POCT I-STAT CREATININE: Creatinine, Ser: 1.1 mg/dL (ref 0.61–1.24)

## 2020-10-18 ENCOUNTER — Telehealth: Payer: Self-pay | Admitting: Cardiology

## 2020-10-18 DIAGNOSIS — I25118 Atherosclerotic heart disease of native coronary artery with other forms of angina pectoris: Secondary | ICD-10-CM

## 2020-10-18 DIAGNOSIS — Z01812 Encounter for preprocedural laboratory examination: Secondary | ICD-10-CM

## 2020-10-18 NOTE — Telephone Encounter (Signed)
Patient calling back to get results and next appt. Please call back

## 2020-10-18 NOTE — Telephone Encounter (Signed)
Spoke with patient. Orders placed for contrasted CT in one year and BMP per protocol and verbal orders from Dr. Antoine Poche.    Matthew Rotunda, MD  10/14/2020 8:53 PM EST     This is a 41 cm aorta which we should follow again in one year with contrasted CT. Please call and arrange. Call [name] with the results and send results to Suzan Slick, MD and arrange one year CT. Thanks.

## 2021-02-05 ENCOUNTER — Other Ambulatory Visit: Payer: Self-pay | Admitting: Cardiology

## 2021-03-12 ENCOUNTER — Other Ambulatory Visit: Payer: Self-pay | Admitting: Family Medicine

## 2021-04-02 ENCOUNTER — Other Ambulatory Visit: Payer: Self-pay | Admitting: Family Medicine

## 2021-05-23 ENCOUNTER — Emergency Department (HOSPITAL_COMMUNITY): Payer: Medicare Other

## 2021-05-23 ENCOUNTER — Emergency Department (HOSPITAL_COMMUNITY)
Admission: EM | Admit: 2021-05-23 | Discharge: 2021-05-23 | Disposition: A | Discharge status: Home / Self Care | Payer: Medicare Other | Attending: Emergency Medicine | Admitting: Emergency Medicine

## 2021-05-23 ENCOUNTER — Other Ambulatory Visit: Payer: Self-pay

## 2021-05-23 ENCOUNTER — Encounter (HOSPITAL_COMMUNITY): Payer: Self-pay

## 2021-05-23 DIAGNOSIS — Z794 Long term (current) use of insulin: Secondary | ICD-10-CM | POA: Insufficient documentation

## 2021-05-23 DIAGNOSIS — Z7984 Long term (current) use of oral hypoglycemic drugs: Secondary | ICD-10-CM | POA: Insufficient documentation

## 2021-05-23 DIAGNOSIS — E1122 Type 2 diabetes mellitus with diabetic chronic kidney disease: Secondary | ICD-10-CM | POA: Insufficient documentation

## 2021-05-23 DIAGNOSIS — H9311 Tinnitus, right ear: Secondary | ICD-10-CM | POA: Diagnosis not present

## 2021-05-23 DIAGNOSIS — I2510 Atherosclerotic heart disease of native coronary artery without angina pectoris: Secondary | ICD-10-CM | POA: Insufficient documentation

## 2021-05-23 DIAGNOSIS — Z79899 Other long term (current) drug therapy: Secondary | ICD-10-CM | POA: Diagnosis not present

## 2021-05-23 DIAGNOSIS — Z7982 Long term (current) use of aspirin: Secondary | ICD-10-CM | POA: Insufficient documentation

## 2021-05-23 DIAGNOSIS — I5032 Chronic diastolic (congestive) heart failure: Secondary | ICD-10-CM | POA: Diagnosis not present

## 2021-05-23 DIAGNOSIS — I669 Occlusion and stenosis of unspecified cerebral artery: Secondary | ICD-10-CM | POA: Insufficient documentation

## 2021-05-23 DIAGNOSIS — Z87891 Personal history of nicotine dependence: Secondary | ICD-10-CM | POA: Diagnosis not present

## 2021-05-23 DIAGNOSIS — N182 Chronic kidney disease, stage 2 (mild): Secondary | ICD-10-CM | POA: Diagnosis not present

## 2021-05-23 DIAGNOSIS — I130 Hypertensive heart and chronic kidney disease with heart failure and stage 1 through stage 4 chronic kidney disease, or unspecified chronic kidney disease: Secondary | ICD-10-CM | POA: Insufficient documentation

## 2021-05-23 LAB — CBC WITH DIFFERENTIAL/PLATELET
Abs Immature Granulocytes: 0.02 10*3/uL (ref 0.00–0.07)
Basophils Absolute: 0.1 10*3/uL (ref 0.0–0.1)
Basophils Relative: 1 %
Eosinophils Absolute: 0.3 10*3/uL (ref 0.0–0.5)
Eosinophils Relative: 5 %
HCT: 32.7 % — ABNORMAL LOW (ref 39.0–52.0)
Hemoglobin: 9.5 g/dL — ABNORMAL LOW (ref 13.0–17.0)
Immature Granulocytes: 0 %
Lymphocytes Relative: 27 %
Lymphs Abs: 1.6 10*3/uL (ref 0.7–4.0)
MCH: 25 pg — ABNORMAL LOW (ref 26.0–34.0)
MCHC: 29.1 g/dL — ABNORMAL LOW (ref 30.0–36.0)
MCV: 86.1 fL (ref 80.0–100.0)
Monocytes Absolute: 0.8 10*3/uL (ref 0.1–1.0)
Monocytes Relative: 12 %
Neutro Abs: 3.3 10*3/uL (ref 1.7–7.7)
Neutrophils Relative %: 55 %
Platelets: 421 10*3/uL — ABNORMAL HIGH (ref 150–400)
RBC: 3.8 MIL/uL — ABNORMAL LOW (ref 4.22–5.81)
RDW: 18.1 % — ABNORMAL HIGH (ref 11.5–15.5)
WBC: 6.1 10*3/uL (ref 4.0–10.5)
nRBC: 0 % (ref 0.0–0.2)

## 2021-05-23 LAB — BASIC METABOLIC PANEL
Anion gap: 8 (ref 5–15)
BUN: 12 mg/dL (ref 8–23)
CO2: 27 mmol/L (ref 22–32)
Calcium: 8.8 mg/dL — ABNORMAL LOW (ref 8.9–10.3)
Chloride: 99 mmol/L (ref 98–111)
Creatinine, Ser: 0.91 mg/dL (ref 0.61–1.24)
GFR, Estimated: >60 mL/min (ref >60)
Glucose, Bld: 264 mg/dL — ABNORMAL HIGH (ref 70–99)
Potassium: 4 mmol/L (ref 3.5–5.1)
Sodium: 134 mmol/L — ABNORMAL LOW (ref 135–145)

## 2021-05-23 MED ORDER — IBUPROFEN 400 MG PO TABS
600.0000 mg | ORAL_TABLET | Freq: Once | ORAL | Status: AC
  Administered 2021-05-23: 600 mg via ORAL
  Filled 2021-05-23: qty 2

## 2021-05-23 MED ORDER — IOHEXOL 350 MG/ML SOLN
75.0000 mL | Freq: Once | INTRAVENOUS | Status: AC | PRN
  Administered 2021-05-23: 75 mL via INTRAVENOUS

## 2021-05-23 MED ORDER — ACETAMINOPHEN 325 MG PO TABS
650.0000 mg | ORAL_TABLET | Freq: Once | ORAL | Status: AC
  Administered 2021-05-23: 650 mg via ORAL
  Filled 2021-05-23: qty 2

## 2021-05-23 NOTE — ED Provider Notes (Signed)
[facility] EMERGENCY DEPARTMENT Provider Note   CSN: [id number] Arrival date & time: 05/23/21  0415     History Chief Complaint  Patient presents with   Otalgia    Matthew Cain is a 65 y.o. male.  Complaining of pulsatile ringing from his right ear ongoing for the past 2 to 3 weeks.  He states that symptoms have not improved to have persisted and he is concerned that something a going on.  Otherwise denies any chest pain or neck pain or abdominal pain.  Denies fevers or cough or vomiting or diarrhea.      Past Medical History:  Diagnosis Date   Amputation finger    left ring finger   Anxiety    CAD (coronary artery disease)    LHC 10/12:  mild plaque in the LAD but no obstructive CAD   Chronic diastolic heart failure (HCC)    EF 55-60%, grade 1 diastolic dysfunction; no WMAs, echo, 11/2011; s/p pulmo. edema c/b VDRF 11/12; Echo 09/08/11: severe LVH, no SAM, no LVOT gradient, EF 55%, mild BAE, mild reduced RVSF.   CKD (chronic kidney disease)    Depression    Diabetes mellitus    A1C 7.3 08/2011   GERD (gastroesophageal reflux disease)    History of kidney stones    History of right ACA stroke    10/12  --  MRI 09/13/11:  Acute right ACA infarct involving corpus callosum.  MRA 09/13/11: severe intracranial ASD, right ACA occluded, left PCA and right MCA with severe disease.  Dopplers were neg for significant ICA stenosis   HLD (hyperlipidemia)    Hypertension    Renal insufficiency    Spermatocele    Thoracic aortic aneurysm (HCC)    CT in 10/12: 4.4 cm ascending thoracic aortic aneurysm    Patient Active Problem List   Diagnosis Date Noted   Hypertensive heart disease without heart failure 09/06/2020   Educated about COVID-19 virus infection 07/08/2020   CKD stage 2 due to type 2 diabetes mellitus (HCC) 08/26/2016   Chest pain 08/25/2016   Hydrocele, left 07/21/2015   BPH with obstruction/lower urinary tract symptoms 07/20/2015   Chronic diastolic heart failure  (HCC) 40/98/119101/21/2014   Palpitations 03/13/2012   Panic disorder 02/13/2012   Thoracic aneurysm 11/23/2011   Cerebral infarction (HCC) 09/17/2011   CAD (coronary artery disease), native coronary artery 09/17/2011   Essential hypertension 09/17/2011   Dyslipidemia 09/17/2011   Type II diabetes mellitus (HCC) 09/17/2011   Chronic kidney disease (CKD) 09/17/2011   Pneumonia 09/17/2011   Cerebral artery occlusion with cerebral infarction (HCC) 09/17/2011   Hyperlipidemia 09/17/2011   Myocardial infarction (HCC) 07/15/2011    Past Surgical History:  Procedure Laterality Date    vasectomy     bilateral   CARDIAC CATHETERIZATION  09/09/11   ROTATOR CUFF REPAIR  07/2015   LEFT   SPERMATOCELECTOMY  1990's   RT side   SPERMATOCELECTOMY Left 07/20/2015   Procedure: LEFT HYDROCELECTOMY;  Surgeon: Bjorn PippinJohn Wrenn, MD;  Location: [facility];  Service: Urology;  Laterality: Left;   TRANSURETHRAL INCISION OF PROSTATE N/A 07/20/2015   Procedure: TRANSURETHRAL INCISION OF THE PROSTATE (TUIP);  Surgeon: Bjorn PippinJohn Wrenn, MD;  Location: [facility];  Service: Urology;  Laterality: N/A;       Family History  Problem Relation Age of Onset   Heart attack Mother    Cerebral aneurysm Father    Cerebral aneurysm Other    Diabetes Other    Hypertension Other  Social History   Tobacco Use   Smoking status: Former    Packs/day: 1.00    Years: 30.00    Pack years: 30.00    Types: Cigarettes    Quit date: 11/13/1994    Years since quitting: 26.5   Smokeless tobacco: Never  Vaping Use   Vaping Use: Never used  Substance Use Topics   Alcohol use: No   Drug use: No    Home Medications Prior to Admission medications   Medication Sig Start Date End Date Taking? Authorizing Provider  amLODipine (NORVASC) 10 MG tablet Take by mouth. 03/07/21  Yes [provider]  atorvastatin (LIPITOR) 80 MG tablet Take by mouth. 02/21/21  Yes [provider]  famotidine (PEPCID) 40 MG tablet Take 1 tablet by mouth at  bedtime. 04/12/21  Yes [provider]  glipiZIDE (GLUCOTROL) 5 MG tablet Take by mouth. 03/28/21 03/28/22 Yes [provider]  insulin detemir (LEVEMIR FLEXTOUCH) 100 UNIT/ML FlexPen Inject into the skin. 03/28/21  Yes [provider]  montelukast (SINGULAIR) 10 MG tablet Take 1 tablet by mouth daily before breakfast. 04/26/21  Yes [provider]  pantoprazole (PROTONIX) 40 MG tablet Take 1 tablet by mouth daily. 12/29/20 12/29/21 Yes [provider]  amitriptyline (ELAVIL) 10 MG tablet Take 20 mg by mouth at bedtime. 11/14/19   [provider]  amLODipine (NORVASC) 10 MG tablet Take 1 tablet (10 mg total) by mouth daily. Patient taking differently: Take 10 mg by mouth every morning.  09/18/12   Serpe, Clide Deutscher, PA-C  Ascorbic Acid (VITAMIN C) 1000 MG tablet Take 1,000 mg by mouth daily.    [provider]  aspirin EC 81 MG tablet Take 81 mg by mouth daily.    [provider]  atorvastatin (LIPITOR) 80 MG tablet Take 80 mg by mouth daily. 10/23/19   [provider]  busPIRone (BUSPAR) 5 MG tablet Take 5 mg by mouth 2 (two) times daily.     [provider]  carvedilol (COREG) 25 MG tablet Take 1 tablet by mouth twice daily 02/07/21   Rollene Rotunda, MD  Cholecalciferol (VITAMIN D3) 125 MCG (5000 UT) CAPS Take 1 capsule by mouth daily.    [provider]  DULoxetine (CYMBALTA) 60 MG capsule Take 1 capsule by mouth daily. 10/04/18   [provider]  famotidine (PEPCID) 40 MG tablet Take 40 mg by mouth daily. 11/12/19   [provider]  ferrous sulfate (IRON SUPPLEMENT) 325 (65 FE) MG tablet Take 325 mg by mouth at bedtime.     [provider]  fluticasone (FLONASE) 50 MCG/ACT nasal spray Place 1 spray into both nostrils daily as needed.     [provider]  ibuprofen (ADVIL,MOTRIN) 800 MG tablet Take 400-800 mg by mouth every 8 (eight) hours as needed for pain.     [provider]  Insulin Glargine (LANTUS SOLOSTAR) 100 UNIT/ML Solostar Pen Inject 55 Units into the skin 2 (two) times daily.     [provider]  isosorbide mononitrate (IMDUR) 30 MG 24 hr tablet Take 1 tablet by mouth once daily 03/14/21   Netta Neat., NP  lisinopril (PRINIVIL,ZESTRIL) 40 MG tablet Take 1 tablet (40 mg total) by mouth daily. 01/10/19   Laqueta Linden, MD  loratadine (CLARITIN) 10 MG tablet Take 10 mg by mouth daily.    [provider]  meloxicam (MOBIC) 15 MG tablet Take 15 mg by mouth daily as needed.  [provider]  metFORMIN (GLUCOPHAGE) 1000 MG tablet Take 1,000 mg by mouth 2 (two) times daily.    [provider]  montelukast (SINGULAIR) 10 MG tablet Take 10 mg by mouth at bedtime.    [provider]  nitroGLYCERIN (NITROSTAT) 0.4 MG SL tablet Place 1 tablet (0.4 mg total) under the tongue every 5 (five) minutes as needed for chest pain. 11/20/19   Laqueta Linden, MD  Omega-3 Fatty Acids (FISH OIL) 1000 MG CAPS Take 1 capsule by mouth in the morning and at bedtime.    [provider]  spironolactone (ALDACTONE) 25 MG tablet Take 1 tablet by mouth once daily 04/04/21   Rollene Rotunda, MD  tetrahydrozoline (EYE DROPS) 0.05 % ophthalmic solution Place 2 drops into both eyes daily as needed.    [provider]    Allergies    Gabapentin and Tomato  Review of Systems   Review of Systems  Constitutional:  Negative for fever.  HENT:  Positive for ear pain. Negative for sore throat.   Eyes:  Negative for pain.  Respiratory:  Negative for cough.   Cardiovascular:  Negative for chest pain.  Gastrointestinal:  Negative for abdominal pain.  Genitourinary:  Negative for flank pain.  Musculoskeletal:  Negative for back pain.  Skin:  Negative for color change and rash.  Neurological:  Negative for syncope.  All other systems reviewed and are negative.  Physical Exam Updated Vital Signs BP (!)  159/88   Pulse 79   Temp 97.9 F (36.6 C) (Oral)   Resp 18   Ht 5\' 10"  (1.778 m)   Wt 109.3 kg   SpO2 96%   BMI 34.57 kg/m   Physical Exam Constitutional:      General: He is not in acute distress.    Appearance: He is well-developed.  HENT:     Head: Normocephalic.     Right Ear: Tympanic membrane, ear canal and external ear normal.     Left Ear: Tympanic membrane, ear canal and external ear normal.     Nose: Nose normal.  Eyes:     Extraocular Movements: Extraocular movements intact.  Neck:     Comments: No carotid bruits or cervical adenopathy noted.  No mastoid tenderness noted. Cardiovascular:     Rate and Rhythm: Normal rate.  Pulmonary:     Effort: Pulmonary effort is normal.  Skin:    Coloration: Skin is not jaundiced.  Neurological:     General: No focal deficit present.     Mental Status: He is alert and oriented to person, place, and time. Mental status is at baseline.     Cranial Nerves: No cranial nerve deficit.     Motor: No weakness.     Gait: Gait normal.    ED Results / Procedures / Treatments   Labs (all labs ordered are listed, but only abnormal results are displayed) Labs Reviewed  CBC WITH DIFFERENTIAL/PLATELET - Abnormal; Notable for the following components:      Result Value   RBC 3.80 (*)    Hemoglobin 9.5 (*)    HCT 32.7 (*)    MCH 25.0 (*)    MCHC 29.1 (*)    RDW 18.1 (*)    Platelets 421 (*)    All other components within normal limits  BASIC METABOLIC PANEL - Abnormal; Notable for the following components:   Sodium 134 (*)    Glucose, Bld 264 (*)    Calcium 8.8 (*)  All other components within normal limits    EKG None  Radiology No results found.  Procedures Procedures   Medications Ordered in ED Medications  iohexol (OMNIPAQUE) 350 MG/ML injection 75 mL (75 mLs Intravenous Contrast Given 05/23/21 0630)  acetaminophen (TYLENOL) tablet 650 mg (650 mg Oral Given 05/23/21 0651)  ibuprofen (ADVIL) tablet 600 mg (600 mg  Oral Given 05/23/21 3235)    ED Course  I have reviewed the triage vital signs and the nursing notes.  Pertinent labs & imaging results that were available during my care of the patient were reviewed by me and considered in my medical decision making (see chart for details).    MDM Rules/Calculators/A&P                          Patient has no focal neurodeficit on exam.  He presents concern with pulsatile tinnitus from the right ear ongoing for 2 to 3 weeks.  Case discussed with neurology regarding imaging modality of choice.  CT angio head and neck pursued.   Final Clinical Impression(s) / ED Diagnoses Final diagnoses:  Tinnitus of right ear    Rx / DC Orders ED Discharge Orders     None        Cheryll Cockayne, MD 05/23/21 [phone]

## 2021-05-23 NOTE — ED Provider Notes (Signed)
Blood pressure (!) 159/88, pulse 79, temperature 97.9 F (36.6 C), temperature source Oral, resp. rate 18, height 5\' 10"  (1.778 m), weight 109.3 kg, SpO2 96 %.  Assuming care from Dr. .  In short, Matthew Cain is a 65 y.o. male with a chief complaint of Otalgia .  Refer to the original H&P for additional details.  The current plan of care is to follow up on CTA and reassess.  07:31 AM  Reviewed the patient's lab work and CTA imaging of the head and neck.  No specific cause of pulsatile tinnitus noted.  Patient does have multiple sites of stenosis as outlined in the report.  No neurologic deficits or TIA/stroke symptoms.  Will refer to neurology for follow-up given these areas of stenosis for further restratification and risk reduction. Advised to follow up ASAP with PCP as well.     75, MD 05/23/21 [phone]

## 2021-05-23 NOTE — ED Triage Notes (Signed)
Pt c/o right ear pain for the past few weeks.

## 2021-05-23 NOTE — Discharge Instructions (Addendum)
You were seen in the emergency department today with pain and pulsatile feeling in the right ear.  Your CT scan did not show any aneurysms but does show multiple areas of narrowing of blood vessels.  This can increase your risk for stroke.  I am referring you to a neurologist and will have you discuss this further with your primary care doctor as well.  If you develop any sudden weakness/numbness/vision change or should call 911 immediately.

## 2021-05-26 ENCOUNTER — Ambulatory Visit (INDEPENDENT_AMBULATORY_CARE_PROVIDER_SITE_OTHER): Payer: Medicare Other | Admitting: Neurology

## 2021-05-26 ENCOUNTER — Encounter: Payer: Self-pay | Admitting: Neurology

## 2021-05-26 VITALS — BP 148/75 | HR 93 | Ht 70.5 in | Wt 228.4 lb

## 2021-05-26 DIAGNOSIS — Z8673 Personal history of transient ischemic attack (TIA), and cerebral infarction without residual deficits: Secondary | ICD-10-CM

## 2021-05-26 DIAGNOSIS — Z9189 Other specified personal risk factors, not elsewhere classified: Secondary | ICD-10-CM

## 2021-05-26 DIAGNOSIS — G08 Intracranial and intraspinal phlebitis and thrombophlebitis: Secondary | ICD-10-CM | POA: Diagnosis not present

## 2021-05-26 DIAGNOSIS — H9311 Tinnitus, right ear: Secondary | ICD-10-CM | POA: Diagnosis not present

## 2021-05-26 NOTE — Progress Notes (Signed)
[facility] [address] [place] [id number] [phone]       OFFICE CONSULT NOTE  [name] Date of Birth:  [birth year] Medical Record Number:  [id number]   Referring MD: Alona BeneJoshua Cain  Reason for Referral: Tinnitus  HPI: [name] is a 65 year old African-American male with past medical history of diabetes, hypertension, hyperlipidemia, congestive heart failure, anxiety, renal insufficiency, coronary artery disease right hemispheric infarct in 2015.  He is seen today for evaluation for new complaint of right ear pulsatile tinnitus for a month.  History is obtained from the patient and review of electronic medical records and personally reviewed available imaging films in PACS.  Patient states he developed sudden onset of pulsatile tinnitus in the right ear about a month ago.  He is unable to attribute to any obvious triggers.  He states he hears his heartbeat in his ear.  It is more prominent when he is inactive sitting or lying down.  He has to sleep lying on his right side.  Putting pressure on the right side of the neck seems to make this feeling temporary suppressed.  He denies any hearing loss, vertigo, gait or balance problems.  He denies any recurrent ear infections or recent trauma or head injury.  He underwent CT angiogram of the brain and neck on 05/23/2021 which I personally reviewed shows multifocal atherosclerotic changes with 40% right proximal ICA, 50% left proximal ICA, moderate left cavernous ICA stenosis as well as severe distal right M1, right A2 stenosis and moderate bilateral vertebral artery stenosis and V4 segment and right posterior cerebral artery in the P2 segment.  CT scan of the head on 11/16/2019 showed no acute abnormalities.  Patient has prior history of right hemispheric infarct in 2015 with left-sided weakness and numbness.  Was felt to be probably due to small vessel disease though I do not have those records to review today.  He was placed  on aspirin and he was participating in the ACCELERATE stroke prevention study.  He was lost to follow-up.  States he had no recurrent stroke or TIA symptoms since then.  He states he had his third shot of [employer] Pf[employer] vaccine on 09/10/2020 had no major side effects from that.  Getting ready to get his fourth COVID booster shot soon.  States his blood pressure is under good control though today it is elevated at 148/75.  Remains on Lipitor which is tolerating well without muscle aches and pains.  His diabetes control remains poor and last hemoglobin A1c was 9.3 a month ago.  He is tolerating aspirin without side effects ROS:   14 system review of systems is positive for tinnitus, ringing sound in the ears all other systems negative  PMH:  Past Medical History:  Diagnosis Date   Amputation finger    left ring finger   Anxiety    CAD (coronary artery disease)    LHC 10/12:  mild plaque in the LAD but no obstructive CAD   Chronic diastolic heart failure (HCC)    EF 55-60%, grade 1 diastolic dysfunction; no WMAs, echo, 11/2011; s/p pulmo. edema c/b VDRF 11/12; Echo 09/08/11: severe LVH, no SAM, no LVOT gradient, EF 55%, mild BAE, mild reduced RVSF.   CKD (chronic kidney disease)    Depression    Diabetes mellitus    A1C 7.3 08/2011   GERD (gastroesophageal reflux disease)    History of kidney stones    History of right ACA stroke    10/12  --  MRI 09/13/11:  Acute right ACA infarct involving corpus callosum.  MRA 09/13/11: severe intracranial ASD, right ACA occluded, left PCA and right MCA with severe disease.  Dopplers were neg for significant ICA stenosis   HLD (hyperlipidemia)    Hypertension    Renal insufficiency    Spermatocele    Thoracic aortic aneurysm (HCC)    CT in 10/12: 4.4 cm ascending thoracic aortic aneurysm    Social History:  Social History   Socioeconomic History   Marital status: Divorced    Spouse name: Not on file   Number of children: Not on file   Years of  education: Not on file   Highest education level: Not on file  Occupational History   Not on file  Tobacco Use   Smoking status: Former    Packs/day: 1.00    Years: 30.00    Pack years: 30.00    Types: Cigarettes    Quit date: 11/13/1994    Years since quitting: 26.5   Smokeless tobacco: Never  Vaping Use   Vaping Use: Never used  Substance and Sexual Activity   Alcohol use: No   Drug use: No   Sexual activity: Not Currently  Other Topics Concern   Not on file  Social History Narrative   Lives alone   Right Handed   Drinks 2-3 cups caffeine daily   Social Determinants of Health   Financial Resource Strain: Not on file  Food Insecurity: Not on file  Transportation Needs: Not on file  Physical Activity: Not on file  Stress: Not on file  Social Connections: Not on file  Intimate Partner Violence: Not on file    Medications:   Current Outpatient Medications on File Prior to Visit  Medication Sig Dispense Refill   amitriptyline (ELAVIL) 10 MG tablet Take 20 mg by mouth at bedtime.     amLODipine (NORVASC) 10 MG tablet Take by mouth.     Ascorbic Acid (VITAMIN C) 1000 MG tablet Take 1,000 mg by mouth daily.     aspirin EC 81 MG tablet Take 81 mg by mouth daily.     atorvastatin (LIPITOR) 80 MG tablet Take 80 mg by mouth daily.     busPIRone (BUSPAR) 5 MG tablet Take 5 mg by mouth 2 (two) times daily.      carvedilol (COREG) 25 MG tablet Take 1 tablet by mouth twice daily 180 tablet 1   Cholecalciferol (VITAMIN D3) 125 MCG (5000 UT) CAPS Take 1 capsule by mouth daily.     DULoxetine (CYMBALTA) 60 MG capsule Take 1 capsule by mouth daily.     famotidine (PEPCID) 40 MG tablet Take 1 tablet by mouth at bedtime.     ferrous sulfate 325 (65 FE) MG tablet Take 325 mg by mouth at bedtime.      fluticasone (FLONASE) 50 MCG/ACT nasal spray Place 1 spray into both nostrils daily as needed.      glipiZIDE (GLUCOTROL) 5 MG tablet Take by mouth.     ibuprofen (ADVIL,MOTRIN) 800 MG  tablet Take 400-800 mg by mouth every 8 (eight) hours as needed for pain.      insulin detemir (LEVEMIR FLEXTOUCH) 100 UNIT/ML FlexPen Inject into the skin.     insulin glargine (LANTUS) 100 UNIT/ML Solostar Pen Inject 55 Units into the skin 2 (two) times daily.      isosorbide mononitrate (IMDUR) 30 MG 24 hr tablet Take 1 tablet by mouth once daily 90 tablet 0   lisinopril (PRINIVIL,ZESTRIL) 40  MG tablet Take 1 tablet (40 mg total) by mouth daily. 90 tablet 3   loratadine (CLARITIN) 10 MG tablet Take 10 mg by mouth daily.     meloxicam (MOBIC) 15 MG tablet Take 15 mg by mouth daily as needed.      metFORMIN (GLUCOPHAGE) 1000 MG tablet Take 1,000 mg by mouth 2 (two) times daily.     montelukast (SINGULAIR) 10 MG tablet Take 10 mg by mouth at bedtime.     nitroGLYCERIN (NITROSTAT) 0.4 MG SL tablet Place 1 tablet (0.4 mg total) under the tongue every 5 (five) minutes as needed for chest pain. 25 tablet 3   Omega-3 Fatty Acids (FISH OIL) 1000 MG CAPS Take 1 capsule by mouth in the morning and at bedtime.     pantoprazole (PROTONIX) 40 MG tablet Take 1 tablet by mouth daily.     spironolactone (ALDACTONE) 25 MG tablet Take 1 tablet by mouth once daily 90 tablet 1   amLODipine (NORVASC) 10 MG tablet Take 1 tablet (10 mg total) by mouth daily. (Patient taking differently: Take 10 mg by mouth every morning.) 30 tablet 6   atorvastatin (LIPITOR) 80 MG tablet Take by mouth.     famotidine (PEPCID) 40 MG tablet Take 40 mg by mouth daily.     montelukast (SINGULAIR) 10 MG tablet Take 1 tablet by mouth daily before breakfast.     tetrahydrozoline 0.05 % ophthalmic solution Place 2 drops into both eyes daily as needed.     No current facility-administered medications on file prior to visit.    Allergies:   Allergies  Allergen Reactions   Gabapentin    Tomato Rash    Physical Exam General: Obese middle-aged African-American male d, seated, in no evident distress Head: head normocephalic and  atraumatic.   Neck: supple with no carotid or supraclavicular bruits Cardiovascular: regular rate and rhythm, no murmurs Musculoskeletal: no deformity Skin:  no rash/petichiae Vascular:  Normal pulses all extremities  Neurologic Exam Mental Status: Awake and fully alert. Oriented to place and time. Recent and remote memory intact. Attention span, concentration and fund of knowledge appropriate. Mood and affect appropriate.  Cranial Nerves: Fundoscopic exam reveals sharp disc margins. Pupils equal, briskly reactive to light. Extraocular movements full without nystagmus. Visual fields full to confrontation. Hearing intact. Facial sensation intact. Face, tongue, palate moves normally and symmetrically.  Motor: Normal bulk and tone. Normal strength in all tested extremity muscles. Sensory.: intact to touch , pinprick , position and vibratory sensation.  Coordination: Rapid alternating movements normal in all extremities. Finger-to-nose and heel-to-shin performed accurately bilaterally. Gait and Station: Arises from chair without difficulty. Stance is normal. Gait demonstrates normal stride length and balance . Able to heel, toe and tandem walk without difficulty.  Reflexes: 1+ and symmetric. Toes downgoing.   NIHSS  0 Modified Rankin  1   ASSESSMENT: 65 year old African-American male with sudden onset of pulsatile tinnitus in the right ear for a month no foreign known etiology.  Interestingly his pulsatile tinnitus is temporary suppress with pressure on the right side of his neck.  Unremarkable neurological exam.  Remote history of right hemispheric small infarct likely from small vessel disease in 2015 with full recovery.  Vascular risk factors of diabetes, hypertension, hyperlipidemia, coronary artery disease, obesity and at risk for sleep apnea     PLAN: I had a Cain discussion with the patient with his new onset right ear pulsatile tinnitus and discussed possible differential diagnosis and  evaluation and treatment plan  and answered questions.  We also discussed results of CT angiogram which was diffuse intracranial atherosclerosis and need for aggressive risk factor modification for prevention of future recurrent strokes.  He was advised to stay on aspirin.  I recommend we check MRI scan of the brain with MR venogram as well as diagnostic cerebral catheter angiogram to see if he has sigmoid jugular web or diverticulum which is endovascularly treatable lesion.  Also check polysomnogram for sleep apnea.  I recommend increase the amitriptyline dose to 30 mg at night to help him sleep better and use Advil PM as well.  Return for follow-up in 3 months or call earlier if necessary. Delia Heady, MD Note: This document was prepared with digital dictation and possible smart phrase technology. Any transcriptional errors that result from this process are unintentional.

## 2021-05-26 NOTE — Patient Instructions (Signed)
I had a long discussion with the patient with his new onset right ear pulsatile tinnitus and discussed possible differential diagnosis and evaluation and treatment plan and answered questions.  We also discussed results of CT angiogram which was diffuse intracranial atherosclerosis and need for aggressive risk factor modification for prevention of future recurrent strokes.  He was advised to stay on aspirin.  I recommend we check MRI scan of the brain with MR venogram as well as diagnostic cerebral catheter angiogram to see if he has endovascularly treatable lesion.  Also check polysomnogram for sleep apnea.  I recommend increase the amitriptyline dose to 30 mg at night to help him sleep better and use Advil PM as well.  Return for follow-up in 3 months or call earlier if necessary. Tinnitus Tinnitus refers to hearing a sound when there is no actual source for that sound. This is often described as ringing in the ears. However, people withthis condition may hear a variety of noises, in one ear or in both ears. The sounds of tinnitus can be soft, loud, or somewhere in between. Tinnitus can last for a few seconds or can be constant for days. It may go away without treatment and come back at various times. When tinnitus is constant or happens often, it can lead to other problems, such as trouble sleeping and troubleconcentrating. Almost everyone experiences tinnitus at some point. Tinnitus is not the same as hearing loss. Tinnitus that is long-lasting (chronic) or comes back often (recurs) may require medical attention. What are the causes? The cause of tinnitus is often not known. In some cases, it can result from: Exposure to loud noises from machinery, music, or other sources. An object (foreign body) stuck in the ear. Earwax buildup. Drinking alcohol or caffeine. Taking certain medicines. Age-related hearing loss. It may also be caused by medical conditions such as: Ear or sinus infections. Heart  diseases or high blood pressure. Allergies. Mnire's disease. Thyroid problems. Tumors. A weak, bulging blood vessel (aneurysm) near the ear. What increases the risk? The following factors may make you more likely to develop this condition: Exposure to loud noises. Age. Tinnitus is more likely in older individuals. Using alcohol or tobacco. What are the signs or symptoms? The main symptom of tinnitus is hearing a sound when there is no source for that sound. It may sound like: Buzzing. Sizzling. Ringing. Blowing air. Hissing. Whistling. Other sounds may include: Roaring. Running water. A musical note. Tapping. Humming. Symptoms may affect only one ear (unilateral) or both ears (bilateral). How is this diagnosed? Tinnitus is diagnosed based on your symptoms, your medical history, and a physical exam. Your health care provider may do a thorough hearing test (audiologic exam) if your tinnitus: Is unilateral. Causes hearing difficulties. Lasts 6 months or longer. You may work with a health care provider who specializes in hearing disorders (audiologist). You may be asked questions about your symptoms and how they affect your daily life. You may have other tests done, such as: CT scan. MRI. An imaging test of how blood flows through your blood vessels (angiogram). How is this treated? Treating an underlying medical condition can sometimes make tinnitus go away. If your tinnitus continues, other treatments may include: Therapy and counseling to help you manage the stress of living with tinnitus. Sound generators to mask the tinnitus. These include: Tabletop sound machines that play relaxing sounds to help you fall asleep. Wearable devices that fit in your ear and play sounds or music. Acoustic neural stimulation.  This involves using headphones to listen to music that contains an auditory signal. Over time, listening to this signal may change some pathways in your brain and make  you less sensitive to tinnitus. This treatment is used for very severe cases when no other treatment is working. Using hearing aids or cochlear implants if your tinnitus is related to hearing loss. Hearing aids are worn in the outer ear. Cochlear implants are surgically placed in the inner ear. Follow these instructions at home: Managing symptoms     When possible, avoid being in loud places and being exposed to loud sounds. Wear hearing protection, such as earplugs, when you are exposed to loud noises. Use a white noise machine, a humidifier, or other devices to mask the sound of tinnitus. Practice techniques for reducing stress, such as meditation, yoga, or deep breathing. Work with your health care provider if you need help with managing stress. Sleep with your head slightly raised. This may reduce the impact of tinnitus. General instructions Do not use stimulants, such as nicotine, alcohol, or caffeine. Talk with your health care provider about other stimulants to avoid. Stimulants are substances that can make you feel alert and attentive by increasing certain activities in the body (such as heart rate and blood pressure). These substances may make tinnitus worse. Take over-the-counter and prescription medicines only as told by your health care provider. Try to get plenty of sleep each night. Keep all follow-up visits. This is important. Contact a health care provider if: Your tinnitus continues for 3 weeks or longer without stopping. You develop sudden hearing loss. Your symptoms get worse or do not get better with home care. You feel you are not able to manage the stress of living with tinnitus. Get help right away if: You develop tinnitus after a head injury. You have tinnitus along with any of the following: Dizziness. Nausea and vomiting. Loss of balance. Sudden, severe headache. Vision changes. Facial weakness or weakness of arms or legs. These symptoms may represent a  serious problem that is an emergency. Do not wait to see if the symptoms will go away. Get medical help right away. Call your local emergency services (911 in the [place] Do not drive yourself to the hospital. Summary Tinnitus refers to hearing a sound when there is no actual source for that sound. This is often described as ringing in the ears. Symptoms may affect only one ear (unilateral) or both ears (bilateral). Use a white noise machine, a humidifier, or other devices to mask the sound of tinnitus. Do not use stimulants, such as nicotine, alcohol, or caffeine. These substances may make tinnitus worse. This information is not intended to replace advice given to you by your health care provider. Make sure you discuss any questions you have with your healthcare provider. Document Revised: 10/04/2020 Document Reviewed: 10/04/2020 [employer] Patient Education  2022 [employer].

## 2021-06-02 ENCOUNTER — Telehealth (HOSPITAL_COMMUNITY): Payer: Self-pay

## 2021-06-02 NOTE — Telephone Encounter (Signed)
Called to schedule diagnostic angio with Dr. Rodrigues, no answer, left vm. AW  

## 2021-06-03 ENCOUNTER — Ambulatory Visit (HOSPITAL_COMMUNITY)
Admission: RE | Admit: 2021-06-03 | Discharge: 2021-06-03 | Disposition: A | Discharge status: Home / Self Care | Payer: Medicare Other | Source: Ambulatory Visit | Attending: Neurology | Admitting: Neurology

## 2021-06-03 ENCOUNTER — Other Ambulatory Visit: Payer: Self-pay | Admitting: Neurology

## 2021-06-03 ENCOUNTER — Other Ambulatory Visit: Payer: Self-pay

## 2021-06-03 ENCOUNTER — Other Ambulatory Visit: Payer: Self-pay | Admitting: Radiology

## 2021-06-03 ENCOUNTER — Encounter (HOSPITAL_COMMUNITY): Payer: Self-pay

## 2021-06-03 DIAGNOSIS — H93A1 Pulsatile tinnitus, right ear: Secondary | ICD-10-CM | POA: Diagnosis present

## 2021-06-03 DIAGNOSIS — H9311 Tinnitus, right ear: Secondary | ICD-10-CM

## 2021-06-03 DIAGNOSIS — Z888 Allergy status to other drugs, medicaments and biological substances status: Secondary | ICD-10-CM | POA: Diagnosis not present

## 2021-06-03 DIAGNOSIS — I6501 Occlusion and stenosis of right vertebral artery: Secondary | ICD-10-CM | POA: Diagnosis not present

## 2021-06-03 DIAGNOSIS — I5032 Chronic diastolic (congestive) heart failure: Secondary | ICD-10-CM | POA: Insufficient documentation

## 2021-06-03 DIAGNOSIS — Z7982 Long term (current) use of aspirin: Secondary | ICD-10-CM | POA: Insufficient documentation

## 2021-06-03 DIAGNOSIS — E1122 Type 2 diabetes mellitus with diabetic chronic kidney disease: Secondary | ICD-10-CM | POA: Insufficient documentation

## 2021-06-03 DIAGNOSIS — I712 Thoracic aortic aneurysm, without rupture: Secondary | ICD-10-CM | POA: Diagnosis not present

## 2021-06-03 DIAGNOSIS — I672 Cerebral atherosclerosis: Secondary | ICD-10-CM | POA: Insufficient documentation

## 2021-06-03 DIAGNOSIS — I130 Hypertensive heart and chronic kidney disease with heart failure and stage 1 through stage 4 chronic kidney disease, or unspecified chronic kidney disease: Secondary | ICD-10-CM | POA: Insufficient documentation

## 2021-06-03 DIAGNOSIS — Z87891 Personal history of nicotine dependence: Secondary | ICD-10-CM | POA: Insufficient documentation

## 2021-06-03 DIAGNOSIS — Z8673 Personal history of transient ischemic attack (TIA), and cerebral infarction without residual deficits: Secondary | ICD-10-CM | POA: Diagnosis not present

## 2021-06-03 DIAGNOSIS — I6523 Occlusion and stenosis of bilateral carotid arteries: Secondary | ICD-10-CM | POA: Insufficient documentation

## 2021-06-03 DIAGNOSIS — Z79899 Other long term (current) drug therapy: Secondary | ICD-10-CM | POA: Insufficient documentation

## 2021-06-03 DIAGNOSIS — Z7984 Long term (current) use of oral hypoglycemic drugs: Secondary | ICD-10-CM | POA: Diagnosis not present

## 2021-06-03 DIAGNOSIS — Z794 Long term (current) use of insulin: Secondary | ICD-10-CM | POA: Diagnosis not present

## 2021-06-03 DIAGNOSIS — Z833 Family history of diabetes mellitus: Secondary | ICD-10-CM | POA: Diagnosis not present

## 2021-06-03 DIAGNOSIS — N189 Chronic kidney disease, unspecified: Secondary | ICD-10-CM | POA: Diagnosis not present

## 2021-06-03 DIAGNOSIS — E785 Hyperlipidemia, unspecified: Secondary | ICD-10-CM | POA: Diagnosis not present

## 2021-06-03 DIAGNOSIS — Z9852 Vasectomy status: Secondary | ICD-10-CM | POA: Insufficient documentation

## 2021-06-03 DIAGNOSIS — Z8249 Family history of ischemic heart disease and other diseases of the circulatory system: Secondary | ICD-10-CM | POA: Diagnosis not present

## 2021-06-03 DIAGNOSIS — I2510 Atherosclerotic heart disease of native coronary artery without angina pectoris: Secondary | ICD-10-CM | POA: Diagnosis not present

## 2021-06-03 HISTORY — PX: IR US GUIDE VASC ACCESS RIGHT: IMG2390

## 2021-06-03 HISTORY — PX: IR ANGIO EXTERNAL CAROTID SEL EXT CAROTID UNI R MOD SED: IMG5371

## 2021-06-03 HISTORY — PX: IR ANGIO INTRA EXTRACRAN SEL INTERNAL CAROTID UNI R MOD SED: IMG5362

## 2021-06-03 HISTORY — PX: IR ANGIO VERTEBRAL SEL VERTEBRAL BILAT MOD SED: IMG5369

## 2021-06-03 HISTORY — PX: IR ANGIO INTRA EXTRACRAN SEL COM CAROTID INNOMINATE UNI L MOD SED: IMG5358

## 2021-06-03 LAB — BASIC METABOLIC PANEL
Anion gap: 10 (ref 5–15)
BUN: 11 mg/dL (ref 8–23)
CO2: 26 mmol/L (ref 22–32)
Calcium: 9.1 mg/dL (ref 8.9–10.3)
Chloride: 97 mmol/L — ABNORMAL LOW (ref 98–111)
Creatinine, Ser: 1.08 mg/dL (ref 0.61–1.24)
GFR, Estimated: >60 mL/min (ref >60)
Glucose, Bld: 258 mg/dL — ABNORMAL HIGH (ref 70–99)
Potassium: 4.6 mmol/L (ref 3.5–5.1)
Sodium: 133 mmol/L — ABNORMAL LOW (ref 135–145)

## 2021-06-03 LAB — CBC
HCT: 29.9 % — ABNORMAL LOW (ref 39.0–52.0)
Hemoglobin: 8.7 g/dL — ABNORMAL LOW (ref 13.0–17.0)
MCH: 24.4 pg — ABNORMAL LOW (ref 26.0–34.0)
MCHC: 29.1 g/dL — ABNORMAL LOW (ref 30.0–36.0)
MCV: 83.8 fL (ref 80.0–100.0)
Platelets: 541 10*3/uL — ABNORMAL HIGH (ref 150–400)
RBC: 3.57 MIL/uL — ABNORMAL LOW (ref 4.22–5.81)
RDW: 16.8 % — ABNORMAL HIGH (ref 11.5–15.5)
WBC: 6.7 10*3/uL (ref 4.0–10.5)
nRBC: 0 % (ref 0.0–0.2)

## 2021-06-03 LAB — PROTIME-INR
INR: 1.1 (ref 0.8–1.2)
Prothrombin Time: 13.7 seconds (ref 11.4–15.2)

## 2021-06-03 LAB — GLUCOSE, CAPILLARY: Glucose-Capillary: 251 mg/dL — ABNORMAL HIGH (ref 70–99)

## 2021-06-03 LAB — APTT: aPTT: 26 seconds (ref 24–36)

## 2021-06-03 MED ORDER — VERAPAMIL HCL 2.5 MG/ML IV SOLN
INTRAVENOUS | Status: DC | PRN
  Administered 2021-06-03: 5 mg via INTRA_ARTERIAL

## 2021-06-03 MED ORDER — IOHEXOL 300 MG/ML  SOLN
100.0000 mL | Freq: Once | INTRAMUSCULAR | Status: AC | PRN
  Administered 2021-06-03: 100 mL via INTRA_ARTERIAL

## 2021-06-03 MED ORDER — FENTANYL CITRATE (PF) 100 MCG/2ML IJ SOLN
INTRAMUSCULAR | Status: DC | PRN
  Administered 2021-06-03: 25 ug via INTRAVENOUS

## 2021-06-03 MED ORDER — IOHEXOL 300 MG/ML  SOLN: 150.0000 mL | Freq: Once | INTRAMUSCULAR | Status: DC | PRN

## 2021-06-03 MED ORDER — VERAPAMIL HCL 2.5 MG/ML IV SOLN
INTRAVENOUS | Status: AC
  Filled 2021-06-03: qty 2

## 2021-06-03 MED ORDER — HEPARIN SODIUM (PORCINE) 1000 UNIT/ML IJ SOLN
INTRAMUSCULAR | Status: AC
  Filled 2021-06-03: qty 1

## 2021-06-03 MED ORDER — SODIUM CHLORIDE (PF) 0.9 % IJ SOLN
INTRAVENOUS | Status: DC | PRN
  Administered 2021-06-03: 200 ug via INTRA_ARTERIAL

## 2021-06-03 MED ORDER — NITROGLYCERIN 1 MG/10 ML FOR IR/CATH LAB
INTRA_ARTERIAL | Status: AC
  Filled 2021-06-03: qty 10

## 2021-06-03 MED ORDER — SODIUM CHLORIDE 0.9 % IV SOLN: INTRAVENOUS | Status: DC

## 2021-06-03 MED ORDER — MIDAZOLAM HCL 2 MG/2ML IJ SOLN
INTRAMUSCULAR | Status: DC | PRN
  Administered 2021-06-03: 1 mg via INTRAVENOUS

## 2021-06-03 MED ORDER — FENTANYL CITRATE (PF) 100 MCG/2ML IJ SOLN
INTRAMUSCULAR | Status: AC
  Filled 2021-06-03: qty 2

## 2021-06-03 MED ORDER — MIDAZOLAM HCL 2 MG/2ML IJ SOLN
INTRAMUSCULAR | Status: AC
  Filled 2021-06-03: qty 2

## 2021-06-03 MED ORDER — HEPARIN SODIUM (PORCINE) 1000 UNIT/ML IJ SOLN
INTRAMUSCULAR | Status: DC | PRN
  Administered 2021-06-03: 5000 [IU] via INTRAVENOUS

## 2021-06-03 MED ORDER — IOHEXOL 240 MG/ML SOLN
50.0000 mL | Freq: Once | INTRAMUSCULAR | Status: AC | PRN
  Administered 2021-06-03: 25 mL via INTRA_ARTERIAL

## 2021-06-03 MED ORDER — LIDOCAINE HCL 1 % IJ SOLN
INTRAMUSCULAR | Status: AC
  Filled 2021-06-03: qty 20

## 2021-06-03 NOTE — H&P (Signed)
Chief Complaint: Patient was seen in consultation today for cerebral angiogram at the request of Sethi,Pramod S  Referring Physician(s): Micki RileySethi,Pramod S  Supervising Physician: Baldemar Lenise Macedo Rodrigues, [place]  [name]  History of Present Illness: Matthew Cain is a 65 y.o. male being worked up for pulsatile tinnitus of the right ear. States it is fairly constant with only brief pauses. Most pronounced when he is resting or the room is quiet. Denies associated headache, vision changes, speech trouble, N/V. Imaging workup did not find any obvious etiology for his symptoms. He has been referred for cerebral angiogram. PMHx, meds, labs, imaging, allergies reviewed. Feels well, no recent fevers, chills, illness. Has been NPO today as directed.   Past Medical History:  Diagnosis Date   Amputation finger    left ring finger   Anxiety    CAD (coronary artery disease)    LHC 10/12:  mild plaque in the LAD but no obstructive CAD   Chronic diastolic heart failure (HCC)    EF 55-60%, grade 1 diastolic dysfunction; no WMAs, echo, 11/2011; s/p pulmo. edema c/b VDRF 11/12; Echo 09/08/11: severe LVH, no SAM, no LVOT gradient, EF 55%, mild BAE, mild reduced RVSF.   CKD (chronic kidney disease)    Depression    Diabetes mellitus    A1C 7.3 08/2011   GERD (gastroesophageal reflux disease)    History of kidney stones    History of right ACA stroke    10/12  --  MRI 09/13/11:  Acute right ACA infarct involving corpus callosum.  MRA 09/13/11: severe intracranial ASD, right ACA occluded, left PCA and right MCA with severe disease.  Dopplers were neg for significant ICA stenosis   HLD (hyperlipidemia)    Hypertension    Renal insufficiency    Spermatocele    Thoracic aortic aneurysm (HCC)    CT in 10/12: 4.4 cm ascending thoracic aortic aneurysm    Past Surgical History:  Procedure Laterality Date    vasectomy     bilateral   CARDIAC CATHETERIZATION  09/09/11   ROTATOR  CUFF REPAIR  07/2015   LEFT   SPERMATOCELECTOMY  1990's   RT side   SPERMATOCELECTOMY Left 07/20/2015   Procedure: LEFT HYDROCELECTOMY;  Surgeon: Bjorn PippinJohn Wrenn, MD;  Location: [facility];  Service: Urology;  Laterality: Left;   TRANSURETHRAL INCISION OF PROSTATE N/A 07/20/2015   Procedure: TRANSURETHRAL INCISION OF THE PROSTATE (TUIP);  Surgeon: Bjorn PippinJohn Wrenn, MD;  Location: [facility];  Service: Urology;  Laterality: N/A;    Allergies: Gabapentin and Tomato  Medications: Prior to Admission medications   Medication Sig Start Date End Date Taking? Authorizing Provider  amitriptyline (ELAVIL) 10 MG tablet Take 20 mg by mouth at bedtime. 11/14/19  Yes [provider]  amLODipine (NORVASC) 10 MG tablet Take 1 tablet (10 mg total) by mouth daily. Patient taking differently: Take 10 mg by mouth every morning. 09/18/12  Yes Serpe, Clide DeutscherEugene C, PA-C  Ascorbic Acid (VITAMIN C) 1000 MG tablet Take 1,000 mg by mouth daily.   Yes [provider]  aspirin EC 81 MG tablet Take 81 mg by mouth daily.   Yes [provider]  atorvastatin (LIPITOR) 80 MG tablet Take by mouth. 02/21/21  Yes [provider]  busPIRone (BUSPAR) 5 MG tablet Take 5 mg by mouth 2 (two) times daily.    Yes [provider]  carvedilol (COREG) 25 MG tablet Take 1 tablet by mouth twice daily 02/07/21  Yes Rollene RotundaHochrein, James, MD  Cholecalciferol (VITAMIN D3) 125 MCG (5000 UT) CAPS Take 1 capsule by mouth daily.   Yes [provider]  DULoxetine (CYMBALTA) 60 MG capsule Take 1 capsule by mouth daily. 10/04/18  Yes [provider]  famotidine (PEPCID) 40 MG tablet Take 1 tablet by mouth at bedtime. 04/12/21  Yes [provider]  ferrous sulfate 325 (65 FE) MG tablet Take 325 mg by mouth at bedtime.    Yes [provider]  fluticasone (FLONASE) 50 MCG/ACT nasal spray Place 1 spray into both nostrils daily as needed.    Yes [provider]  glipiZIDE (GLUCOTROL) 5 MG tablet Take  by mouth. 03/28/21 03/28/22 Yes [provider]  ibuprofen (ADVIL,MOTRIN) 800 MG tablet Take 400-800 mg by mouth every 8 (eight) hours as needed for pain.    Yes [provider]  insulin detemir (LEVEMIR FLEXTOUCH) 100 UNIT/ML FlexPen Inject into the skin. 03/28/21  Yes [provider]  insulin glargine (LANTUS) 100 UNIT/ML Solostar Pen Inject 55 Units into the skin 2 (two) times daily.    Yes [provider]  isosorbide mononitrate (IMDUR) 30 MG 24 hr tablet Take 1 tablet by mouth once daily 03/14/21  Yes Netta Neat., NP  lisinopril (PRINIVIL,ZESTRIL) 40 MG tablet Take 1 tablet (40 mg total) by mouth daily. 01/10/19  Yes Laqueta Linden, MD  loratadine (CLARITIN) 10 MG tablet Take 10 mg by mouth daily.   Yes [provider]  meloxicam (MOBIC) 15 MG tablet Take 15 mg by mouth daily as needed.    Yes [provider]  metFORMIN (GLUCOPHAGE) 1000 MG tablet Take 1,000 mg by mouth 2 (two) times daily.   Yes [provider]  montelukast (SINGULAIR) 10 MG tablet Take 10 mg by mouth at bedtime.   Yes [provider]  montelukast (SINGULAIR) 10 MG tablet Take 1 tablet by mouth daily before breakfast. 04/26/21  Yes [provider]  Omega-3 Fatty Acids (FISH OIL) 1000 MG CAPS Take 1 capsule by mouth in the morning and at bedtime.   Yes [provider]  pantoprazole (PROTONIX) 40 MG tablet Take 1 tablet by mouth daily. 12/29/20 12/29/21 Yes [provider]  spironolactone (ALDACTONE) 25 MG tablet Take 1 tablet by mouth once daily 04/04/21  Yes Hochrein, Fayrene Fearing, MD  tetrahydrozoline 0.05 % ophthalmic solution Place 2 drops into both eyes daily as needed.   Yes [provider]  amLODipine (NORVASC) 10 MG tablet Take by mouth. 03/07/21   [provider]  atorvastatin (LIPITOR) 80 MG tablet Take 80 mg by mouth daily. 10/23/19   [provider]  famotidine (PEPCID) 40 MG tablet Take 40 mg by  mouth daily. 11/12/19   [provider]  nitroGLYCERIN (NITROSTAT) 0.4 MG SL tablet Place 1 tablet (0.4 mg total) under the tongue every 5 (five) minutes as needed for chest pain. 11/20/19   Laqueta Linden, MD     Family History  Problem Relation Age of Onset   Heart attack Mother    Cerebral aneurysm Father    Cerebral aneurysm Other    Diabetes Other    Hypertension Other     Social History   Socioeconomic History   Marital status: Divorced    Spouse name: Not on file   Number of children: Not on file   Years of education: Not on file   Highest education level: Not on file  Occupational History   Not on file  Tobacco Use   Smoking  status: Former    Packs/day: 1.00    Years: 30.00    Pack years: 30.00    Types: Cigarettes    Quit date: 11/13/1994    Years since quitting: 26.5   Smokeless tobacco: Never  Vaping Use   Vaping Use: Never used  Substance and Sexual Activity   Alcohol use: No   Drug use: No   Sexual activity: Not Currently  Other Topics Concern   Not on file  Social History Narrative   Lives alone   Right Handed   Drinks 2-3 cups caffeine daily   Social Determinants of Health   Financial Resource Strain: Not on file  Food Insecurity: Not on file  Transportation Needs: Not on file  Physical Activity: Not on file  Stress: Not on file  Social Connections: Not on file     Review of Systems: A 12 point ROS discussed and pertinent positives are indicated in the HPI above.  All other systems are negative.  Review of Systems  Vital Signs: BP (!) 163/87   Pulse 86   Temp 98.3 F (36.8 C)   Ht  (1.803 m)   Wt 108 kg   SpO2 99%   BMI 33.19 kg/m   Physical Exam Constitutional:      Appearance: Normal appearance. He is not ill-appearing.  HENT:     Mouth/Throat:     Mouth: Mucous membranes are moist.     Pharynx: Oropharynx is clear.  Cardiovascular:     Rate and Rhythm: Normal rate and regular rhythm.     Pulses: Normal  pulses.     Heart sounds: Normal heart sounds.  Pulmonary:     Effort: Pulmonary effort is normal. No respiratory distress.     Breath sounds: Normal breath sounds.  Skin:    General: Skin is warm and dry.  Neurological:     General: No focal deficit present.     Mental Status: He is alert and oriented to person, place, and time.     Cranial Nerves: No cranial nerve deficit.     Motor: No weakness.     Coordination: Coordination normal.  Psychiatric:        Mood and Affect: Mood normal.        Thought Content: Thought content normal.        Judgment: Judgment normal.      Imaging: CT Angio Head W or Wo Contrast  Result Date: 05/23/2021 CLINICAL DATA:  Tinnitus, pulsatile tinnitus. Additional history provided: Patient reports ear pain for the last month, ringing in both ears greater on the right side, "thumping" sound in right ear. EXAM: CT ANGIOGRAPHY HEAD AND NECK TECHNIQUE: Multidetector CT imaging of the head and neck was performed using the standard protocol during bolus administration of intravenous contrast. Multiplanar CT image reconstructions and MIPs were obtained to evaluate the vascular anatomy. Carotid stenosis measurements (when applicable) are obtained utilizing NASCET criteria, using the distal internal carotid diameter as the denominator. CONTRAST:  75mL OMNIPAQUE IOHEXOL 350 MG/ML SOLN COMPARISON:  Noncontrast head CT 11/16/2019. Brain MRI 09/13/2011. MRA head 09/13/2011. FINDINGS: CT HEAD FINDINGS Brain: Mild generalized cerebral atrophy. Mild patchy and ill-defined hypoattenuation within the cerebral white matter, nonspecific but compatible with chronic small vessel ischemic disease. There is no acute intracranial hemorrhage. No demarcated cortical infarct. No extra-axial fluid collection. No evidence of an intracranial mass. No midline shift. Vascular: No hyperdense vessel.  Atherosclerotic calcifications. Skull: Normal. Negative for fracture or focal lesion. Sinuses: No  significant paranasal sinus disease. Orbits: No acute or significant finding. Review of the MIP images confirms the above findings CTA NECK FINDINGS Aortic arch: Common origin of the innominate and left common carotid arteries. Atherosclerotic plaque within the visualized aortic arch and proximal major branch vessels of the neck. No hemodynamically significant innominate or proximal subclavian artery stenosis. Right carotid system: CCA and ICA patent within the neck. Soft and calcified plaque within the carotid bifurcation and proximal ICA. Resultant stenosis of the proximal ICA of 40%. Left carotid system: CCA and ICA patent within the neck. Mild soft plaque within the mid CCA. Soft and calcified plaque within the carotid bifurcation and proximal ICA with less than 50% stenosis at the origin of the left ICA. Vertebral arteries: Patent within the neck. The right vertebral artery is dominant. Scattered soft and calcified plaque within the cervical right vertebral artery without hemodynamically significant stenosis. Soft and calcified plaque at the origin of the non dominant left vertebral artery results in apparent severe stenosis. Scattered atherosclerotic plaque throughout the remainder of the cervical left vertebral artery without additional hemodynamically significant stenosis identified. Skeleton: Cervical spondylosis. No acute bony abnormality or aggressive osseous lesion. Other neck: Bilateral thyroid nodules, the largest on the left measuring 2.2 cm (series 11, image 288). No cervical lymphadenopathy. Upper chest: No consolidation within the imaged lung apices. Review of the MIP images confirms the above findings CTA HEAD FINDINGS Anterior circulation: The intracranial internal carotid arteries are patent. Atherosclerotic plaque within both vessels. No more than mild stenosis of the intracranial right ICA. Moderate to moderately severe stenosis of the distal cavernous/paraclinoid left ICA. The M1 middle  cerebral arteries are patent. Redemonstrated severe stenosis within the distal M1 right MCA. No M2 proximal branch occlusion is identified. Atherosclerotic irregularity of the M2 and more distal middle cerebral arteries bilaterally. The anterior cerebral arteries are patent. Atherosclerotic irregularity of both vessels. Most notably, there are sites of severe stenosis within the distal right A2 anterior cerebral artery. No intracranial aneurysm is identified. Posterior circulation: The intracranial vertebral arteries are patent. Multifocal atherosclerotic plaque within the V4 vertebral arteries with sites of severe stenosis bilaterally. Mild multifocal atherosclerotic irregularity of the basilar artery. The posterior cerebral arteries are patent. Fetal origin left posterior cerebral artery. Multifocal atherosclerotic irregularity of both vessels. Most notably, there is moderate stenosis within the distal P2 right posterior cerebral artery (series 16, image 18). Also of note, there are multiple sites of moderate/severe stenosis within the P2 left PCA. A right posterior communicating artery is hypoplastic or absent. Venous sinuses: Within the limitations of contrast timing, no convincing thrombus. Anatomic variants: As described Review of the MIP images confirms the above findings IMPRESSION: CT head: 1. No evidence of acute intracranial abnormality. 2. Mild generalized cerebral atrophy and cerebral white matter chronic small vessel ischemic disease. CTA neck: 1. The common carotid and internal carotid arteries are patent within the neck. Atherosclerotic plaque results in 40% stenosis at the origin of the right ICA. Soft calcified plaque within the left carotid bifurcation and proximal ICA, resulting in less than 50% stenosis at the origin of the left ICA. 2. Vertebral arteries patent within the neck. Apparent severe stenosis at the origin of the non dominant left vertebral artery. No other hemodynamically significant  stenosis identified within the cervical vertebral arteries. 3. Multiple thyroid nodules, the largest within the left lobe measuring 2.2 cm. Nonemergent dedicated thyroid ultrasound recommended for further evaluation. CTA head: 1. No specific cause of pulsatile tinnitus is identified.  2. No intracranial large vessel occlusion. 3. Advanced intracranial atherosclerotic disease with multifocal stenoses, most notably as follows. 4. Moderate to moderately severe stenosis of the distal cavernous/paraclinoid left ICA. 5. Severe stenosis within the distal M1 segment of the right middle cerebral artery. 6. Sites of severe stenosis within the A2 right anterior cerebral artery. 7. Sites of severe stenosis within the V4 vertebral arteries bilaterally. 8. Moderate/severe stenoses within the P2 left PCA. 9. Moderate stenosis within the P2 right PCA. Electronically Signed   By: Jackey Loge DO   On: 05/23/2021 07:24   CT Angio Neck W and/or Wo Contrast  Result Date: 05/23/2021 CLINICAL DATA:  Tinnitus, pulsatile tinnitus. Additional history provided: Patient reports ear pain for the last month, ringing in both ears greater on the right side, "thumping" sound in right ear. EXAM: CT ANGIOGRAPHY HEAD AND NECK TECHNIQUE: Multidetector CT imaging of the head and neck was performed using the standard protocol during bolus administration of intravenous contrast. Multiplanar CT image reconstructions and MIPs were obtained to evaluate the vascular anatomy. Carotid stenosis measurements (when applicable) are obtained utilizing NASCET criteria, using the distal internal carotid diameter as the denominator. CONTRAST:  75mL OMNIPAQUE IOHEXOL 350 MG/ML SOLN COMPARISON:  Noncontrast head CT 11/16/2019. Brain MRI 09/13/2011. MRA head 09/13/2011. FINDINGS: CT HEAD FINDINGS Brain: Mild generalized cerebral atrophy. Mild patchy and ill-defined hypoattenuation within the cerebral white matter, nonspecific but compatible with chronic small vessel  ischemic disease. There is no acute intracranial hemorrhage. No demarcated cortical infarct. No extra-axial fluid collection. No evidence of an intracranial mass. No midline shift. Vascular: No hyperdense vessel.  Atherosclerotic calcifications. Skull: Normal. Negative for fracture or focal lesion. Sinuses: No significant paranasal sinus disease. Orbits: No acute or significant finding. Review of the MIP images confirms the above findings CTA NECK FINDINGS Aortic arch: Common origin of the innominate and left common carotid arteries. Atherosclerotic plaque within the visualized aortic arch and proximal major branch vessels of the neck. No hemodynamically significant innominate or proximal subclavian artery stenosis. Right carotid system: CCA and ICA patent within the neck. Soft and calcified plaque within the carotid bifurcation and proximal ICA. Resultant stenosis of the proximal ICA of 40%. Left carotid system: CCA and ICA patent within the neck. Mild soft plaque within the mid CCA. Soft and calcified plaque within the carotid bifurcation and proximal ICA with less than 50% stenosis at the origin of the left ICA. Vertebral arteries: Patent within the neck. The right vertebral artery is dominant. Scattered soft and calcified plaque within the cervical right vertebral artery without hemodynamically significant stenosis. Soft and calcified plaque at the origin of the non dominant left vertebral artery results in apparent severe stenosis. Scattered atherosclerotic plaque throughout the remainder of the cervical left vertebral artery without additional hemodynamically significant stenosis identified. Skeleton: Cervical spondylosis. No acute bony abnormality or aggressive osseous lesion. Other neck: Bilateral thyroid nodules, the largest on the left measuring 2.2 cm (series 11, image 288). No cervical lymphadenopathy. Upper chest: No consolidation within the imaged lung apices. Review of the MIP images confirms the  above findings CTA HEAD FINDINGS Anterior circulation: The intracranial internal carotid arteries are patent. Atherosclerotic plaque within both vessels. No more than mild stenosis of the intracranial right ICA. Moderate to moderately severe stenosis of the distal cavernous/paraclinoid left ICA. The M1 middle cerebral arteries are patent. Redemonstrated severe stenosis within the distal M1 right MCA. No M2 proximal branch occlusion is identified. Atherosclerotic irregularity of the M2 and more distal middle  cerebral arteries bilaterally. The anterior cerebral arteries are patent. Atherosclerotic irregularity of both vessels. Most notably, there are sites of severe stenosis within the distal right A2 anterior cerebral artery. No intracranial aneurysm is identified. Posterior circulation: The intracranial vertebral arteries are patent. Multifocal atherosclerotic plaque within the V4 vertebral arteries with sites of severe stenosis bilaterally. Mild multifocal atherosclerotic irregularity of the basilar artery. The posterior cerebral arteries are patent. Fetal origin left posterior cerebral artery. Multifocal atherosclerotic irregularity of both vessels. Most notably, there is moderate stenosis within the distal P2 right posterior cerebral artery (series 16, image 18). Also of note, there are multiple sites of moderate/severe stenosis within the P2 left PCA. A right posterior communicating artery is hypoplastic or absent. Venous sinuses: Within the limitations of contrast timing, no convincing thrombus. Anatomic variants: As described Review of the MIP images confirms the above findings IMPRESSION: CT head: 1. No evidence of acute intracranial abnormality. 2. Mild generalized cerebral atrophy and cerebral white matter chronic small vessel ischemic disease. CTA neck: 1. The common carotid and internal carotid arteries are patent within the neck. Atherosclerotic plaque results in 40% stenosis at the origin of the right  ICA. Soft calcified plaque within the left carotid bifurcation and proximal ICA, resulting in less than 50% stenosis at the origin of the left ICA. 2. Vertebral arteries patent within the neck. Apparent severe stenosis at the origin of the non dominant left vertebral artery. No other hemodynamically significant stenosis identified within the cervical vertebral arteries. 3. Multiple thyroid nodules, the largest within the left lobe measuring 2.2 cm. Nonemergent dedicated thyroid ultrasound recommended for further evaluation. CTA head: 1. No specific cause of pulsatile tinnitus is identified. 2. No intracranial large vessel occlusion. 3. Advanced intracranial atherosclerotic disease with multifocal stenoses, most notably as follows. 4. Moderate to moderately severe stenosis of the distal cavernous/paraclinoid left ICA. 5. Severe stenosis within the distal M1 segment of the right middle cerebral artery. 6. Sites of severe stenosis within the A2 right anterior cerebral artery. 7. Sites of severe stenosis within the V4 vertebral arteries bilaterally. 8. Moderate/severe stenoses within the P2 left PCA. 9. Moderate stenosis within the P2 right PCA. Electronically Signed   By: Jackey Loge DO   On: 05/23/2021 07:24    Labs:  CBC: Recent Labs    05/23/21 0515 06/03/21 1034  WBC 6.1 6.7  HGB 9.5* 8.7*  HCT 32.7* 29.9*  PLT 421* 541*    COAGS: Recent Labs    06/03/21 1034  INR 1.1  APTT 26    BMP: Recent Labs    10/06/20 1019 05/23/21 0515 06/03/21 1034  NA  --  134* 133*  K  --  4.0 4.6  CL  --  99 97*  CO2  --  27 26  GLUCOSE  --  264* 258*  BUN  --  12 11  CALCIUM  --  8.8* 9.1  CREATININE 1.10 0.91 1.08  GFRNONAA  --  >60 >60     Assessment and Plan: Right sided pulsatile tinnitus Plan for cerebral angiogram today Labs reviewed. Risks and benefits of cerebral angiogram were discussed with the patient including, but not limited to bleeding, infection, vascular injury or contrast  induced renal failure.  This interventional procedure involves the use of X-rays and because of the nature of the planned procedure, it is possible that we will have prolonged use of X-ray fluoroscopy.  Potential radiation risks to you include (but are not limited to) the following: - A  slightly elevated risk for cancer  several years later in life. This risk is typically less than 0.5% percent. This risk is low in comparison to the normal incidence of human cancer, which is 33% for women and 50% for men according to the [employer]. - Radiation induced injury can include skin redness, resembling a rash, tissue breakdown / ulcers and hair loss (which can be temporary or permanent).   The likelihood of either of these occurring depends on the difficulty of the procedure and whether you are sensitive to radiation due to previous procedures, disease, or genetic conditions.   IF your procedure requires a prolonged use of radiation, you will be notified and given written instructions for further action.  It is your responsibility to monitor the irradiated area for the 2 weeks following the procedure and to notify your physician if you are concerned that you have suffered a radiation induced injury.    All of the patient's questions were answered, patient is agreeable to proceed.  Consent signed and in chart.    Thank you for this interesting consult.  I greatly enjoyed meeting Matthew Cain and look forward to participating in their care.  A copy of this report was sent to the requesting provider on this date.  Electronically Signed: Brayton El, PA-C 06/03/2021, 11:55 AM   I spent a total of 20 minutes in face to face in clinical consultation, greater than 50% of which was counseling/coordinating care for cerebral angiogram

## 2021-06-03 NOTE — Procedures (Signed)
INTERVENTIONAL NEURORADIOLOGY BRIEF POSTPROCEDURE NOTE  Diagnostic cerebral angiogram   Attending: Dr. Baldemar Lenis  Assistant: None.   Diagnosis: Pulsatile tinnitus, right ear.   Access site: Distal right radial artery.   Access closure: Inflatable band.   Anesthesia: Moderate sedation.   Medication used: 1 mg Versed IV; 25 mcg Fentanyl IV.  Complications: None.   Estimated blood loss: Negligible.   Specimen: None.   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 Severe and diffuse intracranial atherosclerotic disease.  Please, see detailed report on final report on PACs.  Moderate stenosis at the origin of the right vertebral artery. Atherosclerotic changes of the bilateral carotid bifurcation without hemodynamically significant stenosis.  The patient tolerated the procedure well without incident or complication and is in stable condition.

## 2021-06-03 NOTE — Sedation Documentation (Signed)
Pt transported to Short Stay room 1 via stretcher accompanied by RN. Anisha RN at bedside to receive pt. Right wrist remains level 0 with TR band in place. Pt awake alert and oriented. No s/s of distress at this time.

## 2021-06-08 ENCOUNTER — Ambulatory Visit
Admission: RE | Admit: 2021-06-08 | Discharge: 2021-06-08 | Disposition: A | Discharge status: Home / Self Care | Payer: Medicare Other | Source: Ambulatory Visit | Attending: Neurology | Admitting: Neurology

## 2021-06-08 ENCOUNTER — Other Ambulatory Visit: Payer: Self-pay

## 2021-06-08 DIAGNOSIS — H9311 Tinnitus, right ear: Secondary | ICD-10-CM | POA: Diagnosis not present

## 2021-06-08 MED ORDER — GADOBENATE DIMEGLUMINE 529 MG/ML IV SOLN
20.0000 mL | Freq: Once | INTRAVENOUS | Status: AC | PRN
  Administered 2021-06-08: 20 mL via INTRAVENOUS

## 2021-06-10 ENCOUNTER — Telehealth: Payer: Self-pay | Admitting: Neurology

## 2021-06-10 ENCOUNTER — Other Ambulatory Visit: Payer: Self-pay | Admitting: Neurology

## 2021-06-10 DIAGNOSIS — I6319 Cerebral infarction due to embolism of other precerebral artery: Secondary | ICD-10-CM

## 2021-06-10 MED ORDER — CLOPIDOGREL BISULFATE 75 MG PO TABS: 75.0000 mg | ORAL_TABLET | Freq: Every day | ORAL | 11 refills | Status: DC

## 2021-06-10 NOTE — Telephone Encounter (Signed)
I called and left a message on patient's cell phone number to call our office to discuss results of his recent MRI scan of the brain.  The MRI shows multiple small strokes and the patient will need further evaluation for this.  I plan on ordering CT angiogram of brain and neck, transesophageal echocardiogram, lipid profile and hemoglobin A1c.  I also want patient to start taking Plavix 75 mg daily f and stop his aspirin.

## 2021-06-10 NOTE — Telephone Encounter (Signed)
Pt has called back back, he did receive the vm from Dr Pearlean Brownie, he would like a call from RN on Monday to discuss further what is next.

## 2021-06-13 ENCOUNTER — Telehealth: Payer: Self-pay | Admitting: Neurology

## 2021-06-13 ENCOUNTER — Other Ambulatory Visit: Payer: Self-pay | Admitting: Family Medicine

## 2021-06-13 ENCOUNTER — Other Ambulatory Visit: Payer: Self-pay | Admitting: Neurology

## 2021-06-13 NOTE — Telephone Encounter (Signed)
LVM for patient to return call. 

## 2021-06-13 NOTE — Telephone Encounter (Signed)
I called and left a message on patient's answering machine communicating results of MRI scan of the brain showing multiple small strokes bilaterally but these are likely a result of the diagnostic cerebral catheter angiogram that he had an no further diagnostic testing is indicated at the present time.  He was advised to call me back if he had any questions.

## 2021-06-14 NOTE — Telephone Encounter (Signed)
LVM for patient to return call. 

## 2021-06-14 NOTE — Telephone Encounter (Signed)
Pt returned phone call, would like a call from the nurse to discuss MRI results.

## 2021-06-14 NOTE — Telephone Encounter (Signed)
Patient called and unable to take call at that time.  LVM for patient to return call.

## 2021-06-15 NOTE — Telephone Encounter (Signed)
LVM for patient to return call.  If patient returns calls, please let her know the following (or let me know so I can talk to her) please     The results of MRI scan of the brain showed multiple small strokes bilaterally but these are likely a result of the diagnostic cerebral catheter angiogram that was completed.  At this time, Dr. Pearlean Brownie does not recommend any further testing is indicated.  If patient has any questions, please transfer him to me.    Thank you Cicero Duck

## 2021-06-20 NOTE — Telephone Encounter (Signed)
Unable to reach patient by phone.  Letter mailed to patient regarding MRI results.

## 2021-06-29 ENCOUNTER — Other Ambulatory Visit (HOSPITAL_COMMUNITY): Payer: Self-pay | Admitting: Neuroradiology

## 2021-06-29 DIAGNOSIS — I639 Cerebral infarction, unspecified: Secondary | ICD-10-CM

## 2021-07-06 ENCOUNTER — Telehealth (HOSPITAL_COMMUNITY): Payer: Self-pay

## 2021-07-06 NOTE — Telephone Encounter (Signed)
Called to schedule f/u with Dr. Rodrigues, no answer, left vm. AW  

## 2021-07-15 ENCOUNTER — Telehealth: Payer: Self-pay | Admitting: Cardiology

## 2021-07-15 NOTE — Telephone Encounter (Signed)
Spoke to patient -- patient states he has been having headaches and blood pressure elevated since the change in medications. Patient states he has seen primary Dr Wyline Mood  and ENT. Patient thinks he needs to restart acebutolol. It was changed due to insurance denying.   Instructed patient to continue keeping records of blood pressure  an appointment schedule with [facility]

## 2021-07-15 NOTE — Telephone Encounter (Signed)
Matthew Cain is calling requesting a sooner appointment due to having headaches for the past 6 weeks. He states that he is feeling his heartbeat in his head. He has had test ran with other doctor's, but they could not find any reason behind it, due to this he feels it is heart related. Reports no other symptoms.

## 2021-08-05 ENCOUNTER — Ambulatory Visit (INDEPENDENT_AMBULATORY_CARE_PROVIDER_SITE_OTHER): Payer: Medicare Other | Admitting: Pharmacist

## 2021-08-05 ENCOUNTER — Other Ambulatory Visit: Payer: Self-pay

## 2021-08-05 VITALS — BP 150/80 | HR 90 | Wt 224.8 lb

## 2021-08-05 DIAGNOSIS — I10 Essential (primary) hypertension: Secondary | ICD-10-CM | POA: Diagnosis not present

## 2021-08-05 DIAGNOSIS — I672 Cerebral atherosclerosis: Secondary | ICD-10-CM | POA: Diagnosis not present

## 2021-08-05 DIAGNOSIS — I25118 Atherosclerotic heart disease of native coronary artery with other forms of angina pectoris: Secondary | ICD-10-CM | POA: Diagnosis not present

## 2021-08-05 LAB — LIPID PANEL
Chol/HDL Ratio: 2.7 ratio (ref 0.0–5.0)
Cholesterol, Total: 87 mg/dL — ABNORMAL LOW (ref 100–199)
HDL: 32 mg/dL — ABNORMAL LOW (ref >39)
LDL Chol Calc (NIH): 22 mg/dL (ref 0–99)
Triglycerides: 212 mg/dL — ABNORMAL HIGH (ref 0–149)
VLDL Cholesterol Cal: 33 mg/dL (ref 5–40)

## 2021-08-05 MED ORDER — CHLORTHALIDONE 25 MG PO TABS: 12.5000 mg | ORAL_TABLET | Freq: Every day | ORAL | 1 refills | Status: DC

## 2021-08-05 NOTE — Progress Notes (Signed)
Patient ID: Real Cona                 DOB: 11-16-1955                      MRN: [id number]     HPI: Matthew Cain is a 65 y.o. male referred by Dr. Percival Spanish to HTN clinic. PMH is significant for DM, thoracic aneurysm, headaches, cardiomyoptahy, CHF, CAD, CKD, and cerebral infarction.  Patient presents today in good spirits.  Retired, lives in [place]. Reports recently has been having headaches and ringing in his ears. Can hear/feel his heartbeat in his head.    Patient brought home BP log:  9/23: 128/60 9/22: 140/66, 13/68 9/21: 136/81, 139/55 9/20: 152/62, 140/71 9/19: 160/60, 140/70 9/18: 140/70, 136/73 9/17: 152/62, 138/72  Patient recently was seen by neurology who diagnosed small bilateral strokes and has diffuse intracranial atherosclerosis.  Has not had lipid panel updated since 2020. Currently treated with atorvastatin 68m daily with no adverse effects.  Current HTN meds: isosorbide 30 mg daily, amlodipine 153mdaily, spironolactone 25 mg daily, carvedilol 2535mID, lisinopril 42m65mily  Current HLD meds: Atorvasatin 80mg9mly  BP goal: <130/80  Family History: MI: Both parents   Wt Readings from Last 3 Encounters:  06/03/21 238 lb (108 kg)  05/26/21 228 lb 6.4 oz (103.6 kg)  05/23/21 240 lb 15.4 oz (109.3 kg)   BP Readings from Last 3 Encounters:  06/03/21 (!) 170/80  05/26/21 (!) 148/75  05/23/21 (!) 156/82   Pulse Readings from Last 3 Encounters:  06/03/21 84  05/26/21 93  05/23/21 74    Renal function: CrCl cannot be calculated (Patient's most recent lab result is older than the maximum 21 days allowed.).  Past Medical History:  Diagnosis Date   Amputation finger    left ring finger   Anxiety    CAD (coronary artery disease)    LHC 10/12:  mild plaque in the LAD but no obstructive CAD   Chronic diastolic heart failure (HCC)    EF 55-6063-78%de 1 diastolic dysfunction; no WMAs, echo, 11/2011; s/p pulmo. edema c/b VDRF 11/12; Echo 09/08/11: severe LVH, no  SAM, no LVOT gradient, EF 55%, mild BAE, mild reduced RVSF.   CKD (chronic kidney disease)    Depression    Diabetes mellitus    A1C 7.3 08/2011   GERD (gastroesophageal reflux disease)    History of kidney stones    History of right ACA stroke    10/12  --  MRI 09/13/11:  Acute right ACA infarct involving corpus callosum.  MRA 09/13/11: severe intracranial ASD, right ACA occluded, left PCA and right MCA with severe disease.  Dopplers were neg for significant ICA stenosis   HLD (hyperlipidemia)    Hypertension    Renal insufficiency    Spermatocele    Thoracic aortic aneurysm (HCC) [facility] in 10/12: 4.4 cm ascending thoracic aortic aneurysm    Current Outpatient Medications on File Prior to Visit  Medication Sig Dispense Refill   atorvastatin (LIPITOR) 80 MG tablet Take 1 tablet by mouth at bedtime.     Continuous Blood Gluc Receiver (DEXCOM G6 RECEIVER) DEVI Use as directed Dx E11.9     Continuous Blood Gluc Transmit (DEXCOM G6 TRANSMITTER) MISC Use as directed Dx E11.9     insulin detemir (LEVEMIR FLEXTOUCH) 100 UNIT/ML FlexPen Inject into the skin. 60 units BID     amitriptyline (ELAVIL) 10 MG tablet Take 20 mg  by mouth at bedtime.     amLODipine (NORVASC) 10 MG tablet Take 1 tablet (10 mg total) by mouth daily. (Patient taking differently: Take 10 mg by mouth every morning.) 30 tablet 6   amLODipine (NORVASC) 10 MG tablet Take by mouth.     ascorbic acid (VITAMIN C) 1000 MG tablet Take by mouth.     aspirin 81 MG EC tablet Take by mouth.     BD PEN NEEDLE NANO 2ND GEN 32G X 4 MM MISC USE WITH LANTUS SOLOSTAR TWICE DAILY     BINAXNOW COVID-19 AG HOME TEST KIT Use as Directed on the Package     busPIRone (BUSPAR) 5 MG tablet Take 5 mg by mouth 2 (two) times daily.      carvedilol (COREG) 25 MG tablet Take 1 tablet by mouth twice daily 180 tablet 1   Cholecalciferol (VITAMIN D3) 125 MCG (5000 UT) CAPS Take 1 capsule by mouth daily.     clopidogrel (PLAVIX) 75 MG tablet Take 1 tablet  (75 mg total) by mouth daily. 30 tablet 11   DULoxetine (CYMBALTA) 60 MG capsule Take 1 capsule by mouth daily.     famotidine (PEPCID) 40 MG tablet Take 40 mg by mouth daily.     famotidine (PEPCID) 40 MG tablet Take 1 tablet by mouth at bedtime.     ferrous sulfate 325 (65 FE) MG tablet Take 325 mg by mouth at bedtime.      fluticasone (FLONASE) 50 MCG/ACT nasal spray Place 1 spray into both nostrils daily as needed.      glipiZIDE (GLUCOTROL) 5 MG tablet Take by mouth.     ibuprofen (ADVIL,MOTRIN) 800 MG tablet Take 400-800 mg by mouth every 8 (eight) hours as needed for pain.      insulin detemir (LEVEMIR FLEXTOUCH) 100 UNIT/ML FlexPen Inject into the skin.     insulin glargine (LANTUS) 100 UNIT/ML Solostar Pen Inject 55 Units into the skin 2 (two) times daily.      isosorbide mononitrate (IMDUR) 30 MG 24 hr tablet Take 1 tablet by mouth once daily 90 tablet 0   lisinopril (PRINIVIL,ZESTRIL) 40 MG tablet Take 1 tablet (40 mg total) by mouth daily. 90 tablet 3   loratadine (CLARITIN) 10 MG tablet Take by mouth. Take 1 tablet daily     meloxicam (MOBIC) 15 MG tablet Take 15 mg by mouth daily as needed.      metFORMIN (GLUCOPHAGE) 1000 MG tablet Take 1,000 mg by mouth 2 (two) times daily.     montelukast (SINGULAIR) 10 MG tablet Take 10 mg by mouth at bedtime.     montelukast (SINGULAIR) 10 MG tablet Take 1 tablet by mouth daily before breakfast.     nitroGLYCERIN (NITROSTAT) 0.4 MG SL tablet Place 1 tablet (0.4 mg total) under the tongue every 5 (five) minutes as needed for chest pain. 25 tablet 3   Omega-3 Fatty Acids (FISH OIL) 1000 MG CAPS Take 1 capsule by mouth in the morning and at bedtime.     pantoprazole (PROTONIX) 40 MG tablet Take 1 tablet by mouth daily.     spironolactone (ALDACTONE) 25 MG tablet Take 1 tablet by mouth once daily 90 tablet 1   tetrahydrozoline 0.05 % ophthalmic solution Place 2 drops into both eyes daily as needed.     No current facility-administered  medications on file prior to visit.    Allergies  Allergen Reactions   Gabapentin    Tomato Rash     Assessment/Plan:  1. HTN/HLD-  BP in room on left arm 150/80.  Heard "swooshing" sound during BP check from systolic 756-125  that was not present on right arm.  Per Dr C, could be indicative of atherosclerosis.  Will update lipid panel at this time and if above goal, consider addition of PCSK9i.  Patient BP above goal of <130/80.  Does not recall ever being on a thiazide diuretic.  Will start low dose chlorthalidone and check BMP in 2 weeks.  Continue atorvastatin 65m daily Continue isosorbide 337mdaily Continue carvedilol 2528mID Continue lisinopril 28m23mily Continue spironolactone 25mg51mly Start chlorthalidone 12.5mg d32my  Chris Karren CobblemD, BCACP, CDCES,[id number]: [phone] 4:13 PM

## 2021-08-05 NOTE — Patient Instructions (Signed)
It was nice meeting you today  We would like your blood pressure to be less than 130/80 and your LDL (bad cholesterol) to be less than 70  We will update your cholesterol panel today  I am going to add a new medication called chlorthalidone 25mg  1/2 tablet in the morning  Continue isosorbide 30mg  daily Continue amlodipine 10mg  daily Continue carvedilol 25mg  twice a day Continue lisinopril 40mg  daily Continue spironolactone 25mg  daily  Please call with any questions  , PharmD, BCACP, CDCES, CPP [facility] [address] [place],  Phone: [phone]; Fax: [phone] 08/05/2021 10:03 AM

## 2021-08-15 ENCOUNTER — Encounter: Payer: Self-pay | Admitting: *Deleted

## 2021-08-15 ENCOUNTER — Other Ambulatory Visit: Payer: Self-pay | Admitting: Cardiology

## 2021-08-24 ENCOUNTER — Ambulatory Visit (INDEPENDENT_AMBULATORY_CARE_PROVIDER_SITE_OTHER): Payer: Medicare Other | Admitting: Neurology

## 2021-08-24 ENCOUNTER — Encounter: Payer: Self-pay | Admitting: Neurology

## 2021-08-24 VITALS — BP 137/77 | HR 100 | Ht 70.5 in | Wt 224.0 lb

## 2021-08-24 DIAGNOSIS — I639 Cerebral infarction, unspecified: Secondary | ICD-10-CM | POA: Insufficient documentation

## 2021-08-24 DIAGNOSIS — H9311 Tinnitus, right ear: Secondary | ICD-10-CM

## 2021-08-24 DIAGNOSIS — Z9189 Other specified personal risk factors, not elsewhere classified: Secondary | ICD-10-CM

## 2021-08-24 DIAGNOSIS — G2581 Restless legs syndrome: Secondary | ICD-10-CM | POA: Diagnosis not present

## 2021-08-24 DIAGNOSIS — G08 Intracranial and intraspinal phlebitis and thrombophlebitis: Secondary | ICD-10-CM

## 2021-08-24 DIAGNOSIS — I6319 Cerebral infarction due to embolism of other precerebral artery: Secondary | ICD-10-CM

## 2021-08-24 DIAGNOSIS — I5042 Chronic combined systolic (congestive) and diastolic (congestive) heart failure: Secondary | ICD-10-CM

## 2021-08-24 NOTE — Progress Notes (Addendum)
SLEEP MEDICINE CLINIC    Provider:  Melvyn Novas, MD  Primary Care Physician:  Matthew Slick, MD [address] [place] [address]     Referring Provider: Micki Riley, Md [address] [place],  [place] [address]          Chief Complaint according to patient   Patient presents with:     New Patient (Initial Visit)     referral from Dr. Pearlean Cain for OSA. Pt reports having a CT done in July and was told he stopped breathing when asleep. Pt reports loud snoring, fatigue and daytime sleepiness. He chokes and gasps and snores.       HISTORY OF PRESENT ILLNESS:  Matthew Cain is a 64 y.o. year old Black or [place] American male patient seen here as a referral from dr Matthew Brownie, MD ,  on 08/24/2021  for a Sleep consultation. He follows dr Matthew Cain for Tinnitus, too. [name]  Chief concern according to patient :  I had a stroke at age 88 and was paralyzed on the left side , have made a good recovery. My mother died of an MI at age 61. Father and paternal GF had aneurysm rekated brain bleed, father died at age 25. On each side, his grandparents had DM.    I have the pleasure of seeing Matthew Cain today, a right-handed multiracial ( AA, native [place] ) American male with a OSA sleep disorder. He   has a past medical history of Amputation finger, Anxiety, CAD (coronary artery disease), Chronic diastolic heart failure (HCC), CKD (chronic kidney disease), Depression, Diabetes mellitus, GERD (gastroesophageal reflux disease), History of kidney stones, History of right ACA stroke, HLD (hyperlipidemia), Hypertension, Renal insufficiency, Spermatocele, and Thoracic aortic aneurysm. CHF was diagnosed before his CVA. Had pneumonia and SOB. Depression is present, scored 7/ 15 points on geriatric depression score.    .    Sleep relevant medical history: Nocturia 2-4 times, CVA, deviated septum . Sinusitis,    Family medical /sleep history:  see above . Younger brother committed  suicide,  he had be paralyzed at T3-.   Social history:  Patient is retired form Scientific laboratory technician work, and lives in a household alone. Children are 64 and 33, Family status is divorced.  Pets are no present. Tobacco use: quit 1996.   ETOH use ;none. Caffeine intake in form of Coffee( decaff) Soda( diet 1-2 day Tea ( /) or energy drinks./ Regular exercise in form of walking. .   Hobbies :"numbers'       Sleep habits are as follows: The patient's dinner time is between 6-7 PM. The patient goes to bed at 11 but watches Tv, asleep by midnight PM and continues to sleep for intervals of 2-3 hours . The preferred sleep position is non prone, with the support of 2 pillows, flat bed- for GERD.  Dreams are reportedly infrequent/vivid.  9  AM is the usual rise time. The patient wakes up spontaneously. He reports not feeling refreshed or restored in AM, with symptoms such as dry mouth, morning headaches, tinnitus, and residual fatigue.  Naps are taken frequently, lasting from 60 to 90 minutes and are refreshing.    Review of Systems: Out of a complete 14 system review, the patient complains of only the following symptoms, and all other reviewed systems are negative.:  Fatigue, sleepiness , snoring, fragmented sleep, nocturia, choking. Tinnitus, Insomnia    How likely are you to  doze in the following situations: 0 = not likely, 1 = slight chance, 2 = moderate chance, 3 = high chance   Sitting and Reading? Watching Television? Sitting inactive in a public place (theater or meeting)? As a passenger in a car for an hour without a break? Lying down in the afternoon when circumstances permit? Sitting and talking to someone? Sitting quietly after lunch without alcohol? In a car, while stopped for a few minutes in traffic?   Total = 14/ 24 points   FSS endorsed at 52 / 63 points.   Social History   Socioeconomic History   Marital status: Divorced    Spouse name: Not on file   Number of children:  Not on file   Years of education: Not on file   Highest education level: Not on file  Occupational History   Not on file  Tobacco Use   Smoking status: Former    Packs/day: 1.00    Years: 30.00    Pack years: 30.00    Types: Cigarettes    Quit date: 11/13/1994    Years since quitting: 26.7   Smokeless tobacco: Never  Vaping Use   Vaping Use: Never used  Substance and Sexual Activity   Alcohol use: No   Drug use: No   Sexual activity: Not Currently  Other Topics Concern   Not on file  Social History Narrative   Lives alone   Right Handed   Drinks 2-3 cups caffeine daily   Social Determinants of Health   Financial Resource Strain: Not on file  Food Insecurity: Not on file  Transportation Needs: Not on file  Physical Activity: Not on file  Stress: Not on file  Social Connections: Not on file    Family History  Problem Relation Age of Onset   Heart attack Mother    Cerebral aneurysm Father    Cerebral aneurysm Other    Diabetes Other    Hypertension Other     Past Medical History:  Diagnosis Date   Amputation finger    left ring finger   Anxiety    CAD (coronary artery disease)    LHC 10/12:  mild plaque in the LAD but no obstructive CAD   Chronic diastolic heart failure (HCC)    EF 55-60%, grade 1 diastolic dysfunction; no WMAs, echo, 11/2011; s/p pulmo. edema c/b VDRF 11/12; Echo 09/08/11: severe LVH, no SAM, no LVOT gradient, EF 55%, mild BAE, mild reduced RVSF.   CKD (chronic kidney disease)    Depression    Diabetes mellitus    A1C 7.3 08/2011   GERD (gastroesophageal reflux disease)    History of kidney stones    History of right ACA stroke    10/12  --  MRI 09/13/11:  Acute right ACA infarct involving corpus callosum.  MRA 09/13/11: severe intracranial ASD, right ACA occluded, left PCA and right MCA with severe disease.  Dopplers were neg for significant ICA stenosis   HLD (hyperlipidemia)    Hypertension    Renal insufficiency    Spermatocele     Thoracic aortic aneurysm    CT in 10/12: 4.4 cm ascending thoracic aortic aneurysm    Past Surgical History:  Procedure Laterality Date    vasectomy     bilateral   CARDIAC CATHETERIZATION  09/09/11   IR ANGIO EXTERNAL CAROTID SEL EXT CAROTID UNI R MOD SED  06/03/2021   IR ANGIO INTRA EXTRACRAN SEL COM CAROTID INNOMINATE UNI L MOD SED  06/03/2021  IR ANGIO INTRA EXTRACRAN SEL INTERNAL CAROTID UNI R MOD SED  06/03/2021   IR ANGIO VERTEBRAL SEL VERTEBRAL BILAT MOD SED  06/03/2021   IR US GUIDE VASC ACCESS RIGHT  06/03/2021   ROTATOR CUFF REPAIR  07/2015   LEFT   SPERMATOCELECTOMY  1990's   RT side   SPERMATOCELECTOMY Left 07/20/2015   Procedure: LEFT HYDROCELECTOMY;  Surgeon: Irine Seal, MD;  Location: [facility];  Service: Urology;  Laterality: Left;   TRANSURETHRAL INCISION OF PROSTATE N/A 07/20/2015   Procedure: TRANSURETHRAL INCISION OF THE PROSTATE (TUIP);  Surgeon: Irine Seal, MD;  Location: [facility];  Service: Urology;  Laterality: N/A;     Current Outpatient Medications on File Prior to Visit  Medication Sig Dispense Refill   amitriptyline (ELAVIL) 10 MG tablet Take 20 mg by mouth at bedtime.     amLODipine (NORVASC) 10 MG tablet Take 1 tablet (10 mg total) by mouth daily. (Patient taking differently: Take 10 mg by mouth every morning.) 30 tablet 6   amLODipine (NORVASC) 10 MG tablet Take by mouth.     ascorbic acid (VITAMIN C) 1000 MG tablet Take by mouth.     aspirin 81 MG EC tablet Take by mouth.     atorvastatin (LIPITOR) 80 MG tablet Take 1 tablet by mouth at bedtime.     BD PEN NEEDLE NANO 2ND GEN 32G X 4 MM MISC USE WITH LANTUS SOLOSTAR TWICE DAILY     BINAXNOW COVID-19 AG HOME TEST KIT Use as Directed on the Package     busPIRone (BUSPAR) 5 MG tablet Take 5 mg by mouth 2 (two) times daily.     carvedilol (COREG) 25 MG tablet Take 1 tablet by mouth twice daily 180 tablet 0   chlorthalidone (HYGROTON) 25 MG tablet Take 0.5 tablets (12.5 mg total) by mouth daily. 45 tablet 1    Cholecalciferol (VITAMIN D3) 125 MCG (5000 UT) CAPS Take 1 capsule by mouth daily.     clopidogrel (PLAVIX) 75 MG tablet Take 1 tablet (75 mg total) by mouth daily. 30 tablet 11   DULoxetine (CYMBALTA) 60 MG capsule Take 1 capsule by mouth daily.     famotidine (PEPCID) 40 MG tablet Take 40 mg by mouth daily.     ferrous sulfate 325 (65 FE) MG tablet Take 325 mg by mouth at bedtime.      fluticasone (FLONASE) 50 MCG/ACT nasal spray Place 1 spray into both nostrils daily as needed.      glipiZIDE (GLUCOTROL) 5 MG tablet Take by mouth.     ibuprofen (ADVIL,MOTRIN) 800 MG tablet Take 400-800 mg by mouth every 8 (eight) hours as needed for pain.      insulin detemir (LEVEMIR FLEXTOUCH) 100 UNIT/ML FlexPen Inject into the skin. 60 units BID     isosorbide mononitrate (IMDUR) 30 MG 24 hr tablet Take 1 tablet by mouth once daily 90 tablet 0   lisinopril (PRINIVIL,ZESTRIL) 40 MG tablet Take 1 tablet (40 mg total) by mouth daily. 90 tablet 3   loratadine (CLARITIN) 10 MG tablet Take by mouth. Take 1 tablet daily     meloxicam (MOBIC) 15 MG tablet Take 15 mg by mouth daily as needed.     metFORMIN (GLUCOPHAGE) 1000 MG tablet Take 1,000 mg by mouth 2 (two) times daily.     montelukast (SINGULAIR) 10 MG tablet Take 1 tablet by mouth daily before breakfast.     nitroGLYCERIN (NITROSTAT) 0.4 MG SL tablet Place 1 tablet (0.4 mg total) under  the tongue every 5 (five) minutes as needed for chest pain. 25 tablet 3   Omega-3 Fatty Acids (FISH OIL) 1000 MG CAPS Take 1 capsule by mouth in the morning and at bedtime.     pantoprazole (PROTONIX) 40 MG tablet Take 1 tablet by mouth daily.     spironolactone (ALDACTONE) 25 MG tablet Take 1 tablet by mouth once daily 90 tablet 1   tetrahydrozoline 0.05 % ophthalmic solution Place 2 drops into both eyes daily as needed.     No current facility-administered medications on file prior to visit.    Allergies  Allergen Reactions   Gabapentin    Tomato Rash    Physical  exam:  Today's Vitals   08/24/21 0855  BP: 137/77  Pulse: 100  Weight: 224 lb (101.6 kg)  Height: 5' 10.5" (1.791 m)   Body mass index is 31.69 kg/m.   Wt Readings from Last 3 Encounters:  08/24/21 224 lb (101.6 kg)  08/05/21 224 lb 12.8 oz (102 kg)  06/03/21 238 lb (108 kg)     Ht Readings from Last 3 Encounters:  08/24/21 5' 10.5" (1.791 m)  06/03/21 $RemoveB'5\' 11"'[id number]$  (1.803 m)  05/26/21 5' 10.5" (1.791 m)      General: The patient is awake, alert and appears not in acute distress. The patient is well groomed. Head: Normocephalic, atraumatic. Neck is supple. Mallampati 3,  neck circumference:17.5  inches . Nasal airflow not fully patent.  Retrognathia is  seen.  Dental status: cross bite Cardiovascular:  Regular rate and cardiac rhythm by pulse,  without distended neck veins. Respiratory: Lungs are clear to auscultation.  Skin:  Without evidence of ankle edema, or rash. Trunk: The patient's posture is erect.   Neurologic exam : The patient is awake and alert, oriented to place and time.   Memory subjective described as intact.  Attention span & concentration ability appears normal.  Speech is fluent,  without  dysarthria, dysphonia or aphasia.  Mood and affect are appropriate.   Cranial nerves: no loss of smell or taste reported  Pupils are equal and sluggishly reactive to light. Funduscopic exam ; cataract is present in the right eye. [name]  Extraocular movements in vertical and horizontal planes were intact and without nystagmus. No Diplopia. Visual fields by finger perimetry are intact. Hearing was intact to soft voice and finger rubbing.    Facial sensation intact to fine touch.  Facial motor strength is symmetric and tongue and uvula move midline.  Neck ROM : rotation, tilt and flexion extension were normal for age and shoulder shrug was symmetrical.    Motor exam:  Symmetric bulk, tone and ROM.   Normal tone without cog wheeling, symmetric grip strength .   Sensory:  Fine  touch, pinprick and vibration were tested  and  normal.  Proprioception tested in the upper extremities was normal.   Coordination: Rapid alternating movements in the fingers/hands were of normal speed.  The Finger-to-nose maneuver was intact without evidence of ataxia, dysmetria or tremor.   Gait and station: Patient could rise unassisted from a seated position, walked without assistive device.  Stance is of normal width/ base and the patient turned with 4 steps.  Toe and heel walk were deferred.  Deep tendon reflexes: in the  upper and lower extremities are symmetric and intact.  Babinski response was deferred.       After spending a total time of 55  minutes face to face including  additional time for physical and  neurologic examination, review of laboratory studies,  personal review of imaging studies, reports and results of other testing and review of referral information / records as far as provided in visit, I have established the following assessments:  1) excessive daytime sleepiness in a patient with remote history of left hemiparesis following a stroke.  2) newly witnessed sleep apnea while under observation during CT study. Reports snoring, choking, fragmented sleep.  3) RLS  4) CHF can be causing central apneas as well.    My Plan is to proceed with:  1) HST or attended sleep study, screening for  apnea , type of apnea and degree.  2)  CHF related possible complex sleep apnea, SOB indicates  risk of hypoxia.  3) DM - RLS reported, but neuropathy as not evident by tuning fork test.   I would like to thank Leeanne Rio, MD and Garvin Fila, Md [address] [place],  [place] [address] for allowing me to meet with and to take care of this pleasant patient.   In short, Matthew Cain is presenting with EDS, RLS and CHF, a symptom that can be attributed to complex sleep apnea. , I plan to follow up either personally or through our NP within 2-4  month.   CC: I  will share my notes with Dr Leonie Man. .  Electronically signed by: Larey Seat, MD 08/24/2021 9:13 AM  [facility] Board certified by The [employer] of Sleep Medicine and Diplomate of the [employer] of Sleep Medicine. Board certified In Neurology through the [place], Fellow of the [employer] of Neurology. Medical Director of [facility].

## 2021-08-24 NOTE — Patient Instructions (Signed)

## 2021-09-01 ENCOUNTER — Telehealth: Payer: Self-pay

## 2021-09-01 NOTE — Telephone Encounter (Signed)
LVM for pt to call me back to schedule sleep study  

## 2021-09-11 ENCOUNTER — Other Ambulatory Visit: Payer: Self-pay | Admitting: Cardiology

## 2021-09-12 ENCOUNTER — Telehealth: Payer: Self-pay

## 2021-09-12 NOTE — Telephone Encounter (Signed)
LVM for pt to call me back to schedule sleep study  

## 2021-09-19 ENCOUNTER — Telehealth: Payer: Self-pay

## 2021-09-19 NOTE — Telephone Encounter (Signed)
We have attempted to call the patient 2 times to schedule sleep study. Patient has been unavailable at the phone numbers we have on file and has not returned our calls. If patient calls back we will schedule them for their sleep study. ° °

## 2021-09-26 ENCOUNTER — Ambulatory Visit (INDEPENDENT_AMBULATORY_CARE_PROVIDER_SITE_OTHER): Payer: Medicare Other | Admitting: Neurology

## 2021-09-26 ENCOUNTER — Encounter: Payer: Self-pay | Admitting: Neurology

## 2021-09-26 ENCOUNTER — Telehealth (HOSPITAL_COMMUNITY): Payer: Self-pay

## 2021-09-26 ENCOUNTER — Other Ambulatory Visit: Payer: Self-pay

## 2021-09-26 VITALS — BP 158/88 | HR 84 | Ht 70.0 in | Wt 225.0 lb

## 2021-09-26 DIAGNOSIS — I6990 Unspecified sequelae of unspecified cerebrovascular disease: Secondary | ICD-10-CM

## 2021-09-26 DIAGNOSIS — H9313 Tinnitus, bilateral: Secondary | ICD-10-CM

## 2021-09-26 NOTE — Patient Instructions (Signed)
I had a long discussion with the patient with regards to his pulsatile tinnitus and discussed cerebral catheter angiogram findings of jugular bulb diverticulum and I recommended endovascular treatment for removal of this which should help his tinnitus.  Will refer to Dr. Tommi Rumps. Dorice Lamas for the same.  Continue aspirin and Plavix for stroke prevention given history of history of significant cardiac disease and maintain aggressive risk factor modification with strict control of hypertension with blood pressure goal below 130/90, lipids with LDL cholesterol goal below 70 mg percent and diabetes with hemoglobin A1c goal below 6.5%.  He was advised to eat a healthy diet and to exercise regularly and lose weight.  He will return for follow-up in the future in 3 months or call earlier if necessary

## 2021-09-26 NOTE — Progress Notes (Signed)
[facility] [address] [place]. [place] [address] [phone]       OFFICE CONSULT NOTE  [name] Date of Birth:  Nov 01, 1956 Medical Record Number:  [id number]   Referring MD: Nanda Quinton  Reason for Referral: Tinnitus  HPI: Initial visit 05/26/2021 Matthew Cain is a 65 year old African-American male with past medical history of diabetes, hypertension, hyperlipidemia, congestive heart failure, anxiety, renal insufficiency, coronary artery disease right hemispheric infarct in 2015.  He is seen today for evaluation for new complaint of right ear pulsatile tinnitus for a month.  History is obtained from the patient and review of electronic medical records and personally reviewed available imaging films in PACS.  Patient states he developed sudden onset of pulsatile tinnitus in the right ear about a month ago.  He is unable to attribute to any obvious triggers.  He states he hears his heartbeat in his ear.  It is more prominent when he is inactive sitting or lying down.  He has to sleep lying on his right side.  Putting pressure on the right side of the neck seems to make this feeling temporary suppressed.  He denies any hearing loss, vertigo, gait or balance problems.  He denies any recurrent ear infections or recent trauma or head injury.  He underwent CT angiogram of the brain and neck on 05/23/2021 which I personally reviewed shows multifocal atherosclerotic changes with 40% right proximal ICA, 50% left proximal ICA, moderate left cavernous ICA stenosis as well as severe distal right M1, right A2 stenosis and moderate bilateral vertebral artery stenosis and V4 segment and right posterior cerebral artery in the P2 segment.  CT scan of the head on 11/16/2019 showed no acute abnormalities.  Patient has prior history of right hemispheric infarct in 2015 with left-sided weakness and numbness.  Was felt to be probably due to small vessel disease though I do not have those records to  review today.  He was placed on aspirin and he was participating in the ACCELERATE stroke prevention study.  He was lost to follow-up.  States he had no recurrent stroke or TIA symptoms since then.  He states he had his third shot of [employer] vaccine on 09/10/2020 had no major side effects from that.  Getting ready to get his fourth COVID booster shot soon.  States his blood pressure is under good control though today it is elevated at 148/75.  Remains on Lipitor which is tolerating well without muscle aches and pains.  His diabetes control remains poor and last hemoglobin A1c was 9.3 a month ago.  He is tolerating aspirin without side effects Update 09/26/2021 : He returns for follow-up after last visit with me 4 months ago.  Continues to have pulsatile tinnitus which can be suppressed with pressure on the lower part of the right neck.  He says occasionally he gets tinnitus in the left side as well.  He underwent diagnostic cerebral catheter angiogram on 06/03/2021 by Dr. 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  There was diffuse luminal irregularity of the right anterior circulation consistent with intracranial atherosclerotic disease with focal areas of moderate stenosis in the distal right M1 and right A3 ACA segment.  1.5 x 1.2 cm venous outpouching was noted in the jugular bulb on the right.  There was moderate stenosis at the origin of the right vertebral artery and mild stenosis at V1 V2 junction.  There were mild luminal irregularities of left carotid siphon, bilateral ACA  and MCA severe stenosis or the origin of the left A1 ACA segment and moderate stenosis of the A2 A3 junction.  There is moderate stenosis of distal P2 segment on the left as well.  Patient underwent MRI scan of the brain on 06/10/2021 which showed small punctate infarcts involving bilateral cerebellum, right temporal, right frontal and parietal regions these are likely small incidental silent infarcts  which was procedure to complication of the angiogram a week before.  Patient had no corresponding clinical symptoms of stroke at that time.  Lab work on 07/06/2021 showed elevated A1c of 9.8 and LDL cholesterol on 08/05/2021 was quite low at 22 and triglycerides were elevated at 212.  Patient states that he is working with primary care physician to getting his diabetes under better control.  His blood pressure is well controlled.  Tolerating Lipitor well without muscle aches and pains.  He is change his eating habits and is trying to lose weight.  He remains on aspirin and Plavix following his cardiac stents and started in both well without bruising or bleeding.  He has no new complaints today. ROS:   14 system review of systems is positive for tinnitus, ringing sound in the ears all other systems negative  PMH:  Past Medical History:  Diagnosis Date   Amputation finger    left ring finger   Anxiety    CAD (coronary artery disease)    LHC 10/12:  mild plaque in the LAD but no obstructive CAD   Chronic diastolic heart failure (HCC)    EF 62-70%, grade 1 diastolic dysfunction; no WMAs, echo, 11/2011; s/p pulmo. edema c/b VDRF 11/12; Echo 09/08/11: severe LVH, no SAM, no LVOT gradient, EF 55%, mild BAE, mild reduced RVSF.   CKD (chronic kidney disease)    Depression    Diabetes mellitus    A1C 7.3 08/2011   GERD (gastroesophageal reflux disease)    History of kidney stones    History of right ACA stroke    10/12  --  MRI 09/13/11:  Acute right ACA infarct involving corpus callosum.  MRA 09/13/11: severe intracranial ASD, right ACA occluded, left PCA and right MCA with severe disease.  Dopplers were neg for significant ICA stenosis   HLD (hyperlipidemia)    Hypertension    Renal insufficiency    Spermatocele    Thoracic aortic aneurysm    CT in 10/12: 4.4 cm ascending thoracic aortic aneurysm    Social History:  Social History   Socioeconomic History   Marital status: Divorced    Spouse  name: Not on file   Number of children: Not on file   Years of education: Not on file   Highest education level: Not on file  Occupational History   Not on file  Tobacco Use   Smoking status: Former    Packs/day: 1.00    Years: 30.00    Pack years: 30.00    Types: Cigarettes    Quit date: 11/13/1994    Years since quitting: 26.8   Smokeless tobacco: Never  Vaping Use   Vaping Use: Never used  Substance and Sexual Activity   Alcohol use: No   Drug use: No   Sexual activity: Not Currently  Other Topics Concern   Not on file  Social History Narrative   Lives alone   Right Handed   Drinks 2-3 cups caffeine daily   Social Determinants of Health   Financial Resource Strain: Not on file  Food Insecurity: Not on file  Transportation Needs: Not on file  Physical Activity: Not on file  Stress: Not on file  Social Connections: Not on file  Intimate Partner Violence: Not on file    Medications:   Current Outpatient Medications on File Prior to Visit  Medication Sig Dispense Refill   amitriptyline (ELAVIL) 10 MG tablet Take 20 mg by mouth at bedtime.     amLODipine (NORVASC) 10 MG tablet Take 1 tablet (10 mg total) by mouth daily. (Patient taking differently: Take 10 mg by mouth Cain morning.) 30 tablet 6   amLODipine (NORVASC) 10 MG tablet Take by mouth.     ascorbic acid (VITAMIN C) 1000 MG tablet Take by mouth.     aspirin 81 MG EC tablet Take by mouth.     atorvastatin (LIPITOR) 80 MG tablet Take 1 tablet by mouth at bedtime.     BD PEN NEEDLE NANO 2ND GEN 32G X 4 MM MISC USE WITH LANTUS SOLOSTAR TWICE DAILY     BINAXNOW COVID-19 AG HOME TEST KIT Use as Directed on the Package     carvedilol (COREG) 25 MG tablet Take 1 tablet by mouth twice daily 180 tablet 0   Cholecalciferol (VITAMIN D3) 125 MCG (5000 UT) CAPS Take 1 capsule by mouth daily.     DULoxetine (CYMBALTA) 60 MG capsule Take 1 capsule by mouth daily.     famotidine (PEPCID) 40 MG tablet Take 40 mg by mouth  daily.     ferrous sulfate 325 (65 FE) MG tablet Take 325 mg by mouth at bedtime.      fluticasone (FLONASE) 50 MCG/ACT nasal spray Place 1 spray into both nostrils daily as needed.      glipiZIDE (GLUCOTROL) 5 MG tablet Take by mouth.     ibuprofen (ADVIL,MOTRIN) 800 MG tablet Take 400-800 mg by mouth Cain 8 (eight) hours as needed for pain.      insulin detemir (LEVEMIR FLEXTOUCH) 100 UNIT/ML FlexPen Inject into the skin. 60 units BID     isosorbide mononitrate (IMDUR) 30 MG 24 hr tablet Take 1 tablet by mouth once daily 90 tablet 1   lisinopril (PRINIVIL,ZESTRIL) 40 MG tablet Take 1 tablet (40 mg total) by mouth daily. 90 tablet 3   loratadine (CLARITIN) 10 MG tablet Take by mouth. Take 1 tablet daily     meloxicam (MOBIC) 15 MG tablet Take 15 mg by mouth daily as needed.     metFORMIN (GLUCOPHAGE) 1000 MG tablet Take 1,000 mg by mouth 2 (two) times daily.     montelukast (SINGULAIR) 10 MG tablet Take 1 tablet by mouth daily before breakfast.     nitroGLYCERIN (NITROSTAT) 0.4 MG SL tablet Place 1 tablet (0.4 mg total) under the tongue Cain 5 (five) minutes as needed for chest pain. 25 tablet 3   Omega-3 Fatty Acids (FISH OIL) 1000 MG CAPS Take 1 capsule by mouth in the morning and at bedtime.     pantoprazole (PROTONIX) 40 MG tablet Take 1 tablet by mouth daily.     spironolactone (ALDACTONE) 25 MG tablet Take 1 tablet by mouth once daily 90 tablet 1   tetrahydrozoline 0.05 % ophthalmic solution Place 2 drops into both eyes daily as needed.     No current facility-administered medications on file prior to visit.    Allergies:   Allergies  Allergen Reactions   Gabapentin    Tomato Rash    Physical Exam General: Obese middle-aged African-American male d, seated, in no evident distress Head: head normocephalic and  atraumatic.   Neck: supple with no carotid or supraclavicular bruits.  No jugular bruit. Cardiovascular: regular rate and rhythm, no murmurs Musculoskeletal: no  deformity Skin:  no rash/petichiae Vascular:  Normal pulses all extremities  Neurologic Exam Mental Status: Awake and fully alert. Oriented to place and time. Recent and remote memory intact. Attention span, concentration and fund of knowledge appropriate. Mood and affect appropriate.  Cranial Nerves: Fundoscopic exam not done. Pupils equal, briskly reactive to light. Extraocular movements full without nystagmus. Visual fields full to confrontation. Hearing intact. Facial sensation intact. Face, tongue, palate moves normally and symmetrically.  Motor: Normal bulk and tone. Normal strength in all tested extremity muscles. Sensory.: intact to touch , pinprick , position and vibratory sensation.  Coordination: Rapid alternating movements normal in all extremities. Finger-to-nose and heel-to-shin performed accurately bilaterally. Gait and Station: Arises from chair without difficulty. Stance is normal. Gait demonstrates normal stride length and balance . Able to heel, toe and tandem walk without difficulty.  Reflexes: 1+ and symmetric. Toes downgoing.   NIHSS  0 Modified Rankin  1   ASSESSMENT: 65 year old African-American male with sudden onset of pulsatile tinnitus in the right ear for 5 months which temporary suppresses with pressure on the right side of his neck.  Unremarkable neurological exam.  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  Remote history of right hemispheric small infarct likely from small vessel disease in 2015 with full recovery.  Vascular risk factors of diabetes, hypertension, hyperlipidemia, coronary artery disease, obesity and at risk for sleep apnea     PLAN: I had a long discussion with the patient with regards to his pulsatile tinnitus and discussed cerebral catheter angiogram findings of jugular bulb diverticulum and I recommended endovascular treatment for removal of this which should help his  tinnitus.  Will refer to Dr. Tennis Must. Janean Sark for the same.  Continue aspirin and Plavix for stroke prevention given history of history of significant cardiac disease and maintain aggressive risk factor modification with strict control of hypertension with blood pressure goal below 130/90, lipids with LDL cholesterol goal below 70 mg percent and diabetes with hemoglobin A1c goal below 6.5%.  He was advised to eat a healthy diet and to exercise regularly and lose weight.  He will return for follow-up in the future in 3 months or call earlier if necessary.  Greater than 50% time during this 25-minute follow-up visit was spent on counseling and coordination of care about his pulsatile tinnitus and discussion about angiogram findings and treatment planning and answering questions Antony Contras, MD Note: This document was prepared with digital dictation and possible smart phrase technology. Any transcriptional errors that result from this process are unintentional.

## 2021-09-26 NOTE — Telephone Encounter (Signed)
Called to schedule consult, no answer, left vm. AW  

## 2021-09-27 DIAGNOSIS — I517 Cardiomegaly: Secondary | ICD-10-CM | POA: Insufficient documentation

## 2021-09-27 DIAGNOSIS — I422 Other hypertrophic cardiomyopathy: Secondary | ICD-10-CM | POA: Insufficient documentation

## 2021-09-27 NOTE — Progress Notes (Signed)
Cardiology Office Note   Date:  09/28/2021   ID:  Matthew Cain, DOB [birth year], MRN [id number]  PCP:  Leeanne Rio, MD  Cardiologist:   Minus Breeding, MD   Chief Complaint  Patient presents with   ASH       History of Present Illness: Matthew Cain is a 65 y.o. male who presents for follow up of coronary artery disease, palpitations, and chronic diastolic heart failure.  He was previously seen by Dr. Bronson Ing.  He has had chest pain with mild CAD on cath in 08/2011.  He had a low risk scan with no ischemia in January 2020.  He has a normal EF on echo last year with severe septal hypertrophy and grade one diastolic dysfunction.   He also has a slightly enlarged aortic root.  After my first visit with him I sent him for an MRI.  This did suggest septal hypertrophy.    Since I last saw him he has been seen by neurology for pulsatile tinnitus.  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  He has not had any new cardiovascular complaints.  He says he stays active.  He denies any chest pressure, neck or arm discomfort.  He has no shortness of breath, PND or orthopnea.  He does not have any palpitations, presyncope or syncope.  He has no weight gain or edema.   Past Medical History:  Diagnosis Date   Amputation finger    left ring finger   Anxiety    CAD (coronary artery disease)    LHC 10/12:  mild plaque in the LAD but no obstructive CAD   Chronic diastolic heart failure (HCC)    EF 96-28%, grade 1 diastolic dysfunction; no WMAs, echo, 11/2011; s/p pulmo. edema c/b VDRF 11/12; Echo 09/08/11: severe LVH, no SAM, no LVOT gradient, EF 55%, mild BAE, mild reduced RVSF.   CKD (chronic kidney disease)    Depression    Diabetes mellitus    A1C 7.3 08/2011   GERD (gastroesophageal reflux disease)    History of kidney stones    History of right ACA stroke    10/12  --  MRI 09/13/11:  Acute right ACA infarct involving corpus callosum.  MRA  09/13/11: severe intracranial ASD, right ACA occluded, left PCA and right MCA with severe disease.  Dopplers were neg for significant ICA stenosis   HLD (hyperlipidemia)    Hypertension    Renal insufficiency    Spermatocele    Thoracic aortic aneurysm    CT in 10/12: 4.4 cm ascending thoracic aortic aneurysm    Past Surgical History:  Procedure Laterality Date    vasectomy     bilateral   CARDIAC CATHETERIZATION  09/09/11   IR ANGIO EXTERNAL CAROTID SEL EXT CAROTID UNI R MOD SED  06/03/2021   IR ANGIO INTRA EXTRACRAN SEL COM CAROTID INNOMINATE UNI L MOD SED  06/03/2021   IR ANGIO INTRA EXTRACRAN SEL INTERNAL CAROTID UNI R MOD SED  06/03/2021   IR ANGIO VERTEBRAL SEL VERTEBRAL BILAT MOD SED  06/03/2021   IR US GUIDE VASC ACCESS RIGHT  06/03/2021   ROTATOR CUFF REPAIR  07/2015   LEFT   SPERMATOCELECTOMY  1990's   RT side   SPERMATOCELECTOMY Left 07/20/2015   Procedure: LEFT HYDROCELECTOMY;  Surgeon: Irine Seal, MD;  Location: [facility];  Service: Urology;  Laterality: Left;   TRANSURETHRAL INCISION OF PROSTATE N/A 07/20/2015   Procedure: TRANSURETHRAL INCISION OF  THE PROSTATE (TUIP);  Surgeon: Irine Seal, MD;  Location: [facility];  Service: Urology;  Laterality: N/A;     Current Outpatient Medications  Medication Sig Dispense Refill   acetaminophen (TYLENOL) 500 MG tablet Take 500 mg by mouth 2 (two) times daily.     amitriptyline (ELAVIL) 10 MG tablet Take 20 mg by mouth at bedtime.     amLODipine (NORVASC) 10 MG tablet Take by mouth.     ascorbic acid (VITAMIN C) 1000 MG tablet Take by mouth.     aspirin 81 MG EC tablet Take by mouth.     atorvastatin (LIPITOR) 80 MG tablet Take 1 tablet by mouth at bedtime.     Cholecalciferol (VITAMIN D3) 125 MCG (5000 UT) CAPS Take 1 capsule by mouth daily.     DULoxetine (CYMBALTA) 60 MG capsule Take 1 capsule by mouth daily.     famotidine (PEPCID) 40 MG tablet Take 40 mg by mouth daily.     ferrous sulfate 325 (65 FE) MG tablet Take 325 mg by mouth at  bedtime.      fluticasone (FLONASE) 50 MCG/ACT nasal spray Place 1 spray into both nostrils daily as needed.      glipiZIDE (GLUCOTROL) 5 MG tablet Take 5 mg by mouth. Take two tablets by mouth twice daily     ibuprofen (ADVIL,MOTRIN) 800 MG tablet Take 400-800 mg by mouth every 8 (eight) hours as needed for pain.      insulin detemir (LEVEMIR FLEXTOUCH) 100 UNIT/ML FlexPen Inject into the skin. 60 units BID     ipratropium (ATROVENT) 0.06 % nasal spray SMARTSIG:2 Spray(s) Both Nares Twice Daily PRN     loratadine (CLARITIN) 10 MG tablet Take by mouth. Take 1 tablet daily     metFORMIN (GLUCOPHAGE) 1000 MG tablet Take 1,000 mg by mouth 2 (two) times daily.     montelukast (SINGULAIR) 10 MG tablet Take 1 tablet by mouth daily before breakfast.     nitroGLYCERIN (NITROSTAT) 0.4 MG SL tablet Place 1 tablet (0.4 mg total) under the tongue every 5 (five) minutes as needed for chest pain. 25 tablet 3   Omega-3 Fatty Acids (FISH OIL) 1000 MG CAPS Take 1 capsule by mouth in the morning and at bedtime.     pantoprazole (PROTONIX) 40 MG tablet Take 1 tablet by mouth daily.     amLODipine (NORVASC) 10 MG tablet Take 1 tablet (10 mg total) by mouth every morning. 90 tablet 3   BD PEN NEEDLE NANO 2ND GEN 32G X 4 MM MISC USE WITH LANTUS SOLOSTAR TWICE DAILY     BINAXNOW COVID-19 AG HOME TEST KIT Use as Directed on the Package     carvedilol (COREG) 25 MG tablet Take 1 tablet (25 mg total) by mouth 2 (two) times daily. 180 tablet 3   isosorbide mononitrate (IMDUR) 30 MG 24 hr tablet Take 1 tablet (30 mg total) by mouth daily. 90 tablet 3   lisinopril (ZESTRIL) 40 MG tablet Take 1 tablet (40 mg total) by mouth daily. 90 tablet 3   meloxicam (MOBIC) 15 MG tablet Take 15 mg by mouth daily as needed. (Patient not taking: Reported on 09/28/2021)     spironolactone (ALDACTONE) 25 MG tablet Take 1 tablet (25 mg total) by mouth daily. 90 tablet 3   tetrahydrozoline 0.05 % ophthalmic solution Place 2 drops into both  eyes daily as needed. (Patient not taking: Reported on 09/28/2021)     No current facility-administered medications for this visit.  Allergies:   Gabapentin and Tomato    ROS:  Please see the history of present illness.   Otherwise, review of systems are positive for none.   All other systems are reviewed and negative.    PHYSICAL EXAM: VS:  BP 132/80   Pulse 84   Ht 5' 10" (1.778 m)   Wt 225 lb (102.1 kg)   BMI 32.28 kg/m  , BMI Body mass index is 32.28 kg/m. GENERAL:  Well appearing NECK:  No jugular venous distention, waveform within normal limits, carotid upstroke brisk and symmetric, no bruits, no thyromegaly LUNGS:  Clear to auscultation bilaterally CHEST:  Unremarkable HEART:  PMI not displaced or sustained,S1 and S2 within normal limits, no S3, no S4, no clicks, no rubs, systolic murmur early peaking radiating at the aortic outflow tract, no diastolic murmurs ABD:  Flat, positive bowel sounds normal in frequency in pitch, no bruits, no rebound, no guarding, no midline pulsatile mass, no hepatomegaly, no splenomegaly EXT:  2 plus pulses throughout, no edema, no cyanosis no clubbing   EKG:  EKG is  ordered today. Sinus rhythm, rate 84, left axis deviation, left anterior fascicular block, interventricular conduction delay, poor anterior R wave progression, no acute ST-T wave changes.    Recent Labs: 06/03/2021: BUN 11; Creatinine, Ser 1.08; Hemoglobin 8.7; Platelets 541; Potassium 4.6; Sodium 133    Lipid Panel    Component Value Date/Time   CHOL 87 (L) 08/05/2021 1021   TRIG 212 (H) 08/05/2021 1021   HDL 32 (L) 08/05/2021 1021   CHOLHDL 2.7 08/05/2021 1021   CHOLHDL 3.9 11/27/2018 0511   VLDL 74 (H) 11/27/2018 0511   LDLCALC 22 08/05/2021 1021      Wt Readings from Last 3 Encounters:  09/28/21 225 lb (102.1 kg)  09/26/21 225 lb (102.1 kg)  08/24/21 224 lb (101.6 kg)      Other studies Reviewed: Additional studies/ records that were reviewed today  include: MRI, neurology notes Review of the above records demonstrates:  Please see elsewhere in the note.     ASSESSMENT AND PLAN:  Asymmetric Septal Hypertrophy:   The patient does not have a family history of sudden death in his mother at age 65.  He does have suggestion of septal hypertrophy on the MRI.  There have been no high risk features.  I am going to apply a 3-day Zio patch to further screen for arrhythmias.  The only other high risk features and would be his family history of sudden death which was said to be a heart attack.  I am going to send him for discussion with Dr. Broadus John around the utility of genetic testing.  I have talked with him to consult his son and daughter about this as well.  He has a 40 year old son and 75 year old daughter.  Coronary artery disease:   He had nonobstructive disease and previous stress test without high risk for findings.   He has no ongoing angina.  No further testing.   Chronic diastolic heart failure: He seems to be euvolemic.  No change in therapy.  Palpitations: He has not reporting palpitations.  I will evaluate as above.     Hypertension: His blood pressure is   Thoracic aortic aneurysm:  Was 20m by MRI.  This was stable compared to a previous CT.    Current medicines are reviewed at length with the patient today.  The patient does not have concerns regarding medicines.  The following changes have been made: none  Labs/ tests ordered today include:     Orders Placed This Encounter  Procedures   Ambulatory referral to [place] (3-14 DAYS)   EKG 12-Lead      Disposition:   FU with me in one year.     Signed, Minus Breeding, MD  09/28/2021 5:04 PM    [place]

## 2021-09-28 ENCOUNTER — Ambulatory Visit (INDEPENDENT_AMBULATORY_CARE_PROVIDER_SITE_OTHER): Payer: Medicare Other | Admitting: Cardiology

## 2021-09-28 ENCOUNTER — Other Ambulatory Visit: Payer: Self-pay

## 2021-09-28 ENCOUNTER — Ambulatory Visit (INDEPENDENT_AMBULATORY_CARE_PROVIDER_SITE_OTHER): Payer: Medicare Other

## 2021-09-28 ENCOUNTER — Encounter: Payer: Self-pay | Admitting: Cardiology

## 2021-09-28 VITALS — BP 132/80 | HR 84 | Ht 70.0 in | Wt 225.0 lb

## 2021-09-28 DIAGNOSIS — R002 Palpitations: Secondary | ICD-10-CM | POA: Diagnosis not present

## 2021-09-28 DIAGNOSIS — I5042 Chronic combined systolic (congestive) and diastolic (congestive) heart failure: Secondary | ICD-10-CM

## 2021-09-28 DIAGNOSIS — I422 Other hypertrophic cardiomyopathy: Secondary | ICD-10-CM

## 2021-09-28 DIAGNOSIS — I5032 Chronic diastolic (congestive) heart failure: Secondary | ICD-10-CM | POA: Diagnosis not present

## 2021-09-28 MED ORDER — AMLODIPINE BESYLATE 10 MG PO TABS: 10.0000 mg | ORAL_TABLET | ORAL | 3 refills | Status: DC

## 2021-09-28 MED ORDER — SPIRONOLACTONE 25 MG PO TABS: 25.0000 mg | ORAL_TABLET | Freq: Every day | ORAL | 3 refills | Status: DC

## 2021-09-28 MED ORDER — ISOSORBIDE MONONITRATE ER 30 MG PO TB24: 30.0000 mg | ORAL_TABLET | Freq: Every day | ORAL | 3 refills | Status: DC

## 2021-09-28 MED ORDER — CARVEDILOL 25 MG PO TABS: 25.0000 mg | ORAL_TABLET | Freq: Two times a day (BID) | ORAL | 3 refills | Status: DC

## 2021-09-28 MED ORDER — LISINOPRIL 40 MG PO TABS: 40.0000 mg | ORAL_TABLET | Freq: Every day | ORAL | 3 refills | Status: DC

## 2021-09-28 NOTE — Progress Notes (Unsigned)
Enrolled patient for a 3 day Zio XT monitor to be mailed to patients home  

## 2021-09-28 NOTE — Patient Instructions (Signed)
Medication Instructions:  The current medical regimen is effective;  continue present plan and medications.  *If you need a refill on your cardiac medications before your next appointment, please call your pharmacy*  You have been referred to Dr Sidney Ace.  You will be contacted to be scheduled.  She sees patients in our [address], [place], [place] office.  Testing/Procedures: Christena Deem- Long Term Monitor Instructions  Your physician has requested you wear a ZIO patch monitor for 3 days.  This is a single patch monitor. [employer] supplies one patch monitor per enrollment. Additional stickers are not available. Please do not apply patch if you will be having a Nuclear Stress Test,  Echocardiogram, Cardiac CT, MRI, or Chest Xray during the period you would be wearing the  monitor. The patch cannot be worn during these tests. You cannot remove and re-apply the  ZIO XT patch monitor.  Your ZIO patch monitor will be mailed 3 day USPS to your address on file. It may take 3-5 days  to receive your monitor after you have been enrolled.  Once you have received your monitor, please review the enclosed instructions. Your monitor  has already been registered assigning a specific monitor serial # to you.  Billing and Patient Assistance Program Information  We have supplied [employer] with any of your insurance information on file for billing purposes. [employer] offers a sliding scale Patient Assistance Program for patients that do not have  insurance, or whose insurance does not completely cover the cost of the ZIO monitor.  You must apply for the Patient Assistance Program to qualify for this discounted rate.  To apply, please call [employer] at [phone], select option 4, select option 2, ask to apply for  Patient Assistance Program. Meredeth Ide will ask your household income, and how many people  are in your household. They will quote your out-of-pocket cost based on that information.   [employer] will also be able to set up a 18-month, interest-free payment plan if needed.  Applying the monitor   Shave hair from upper left chest.  Hold abrader disc by orange tab. Rub abrader in 40 strokes over the upper left chest as  indicated in your monitor instructions.  Clean area with 4 enclosed alcohol pads. Let dry.  Apply patch as indicated in monitor instructions. Patch will be placed under collarbone on left  side of chest with arrow pointing upward.  Rub patch adhesive wings for 2 minutes. Remove white label marked "1". Remove the white  label marked "2". Rub patch adhesive wings for 2 additional minutes.  While looking in a mirror, press and release button in center of patch. A small green light will  flash 3-4 times. This will be your only indicator that the monitor has been turned on.  Do not shower for the first 24 hours. You may shower after the first 24 hours.  Press the button if you feel a symptom. You will hear a small click. Record Date, Time and  Symptom in the Patient Logbook.  When you are ready to remove the patch, follow instructions on the last 2 pages of Patient  Logbook. Stick patch monitor onto the last page of Patient Logbook.  Place Patient Logbook in the blue and white box. Use locking tab on box and tape box closed  securely. The blue and white box has prepaid postage on it. Please place it in the mailbox as  soon as possible. Your physician should have your test  results approximately 7 days after the  monitor has been mailed back to [facility].  Call [facility] Customer Care at [phone] if you have questions regarding  your ZIO XT patch monitor. Call them immediately if you see an orange light blinking on your  monitor.  If your monitor falls off in less than 4 days, contact our Monitor department at [phone].  If your monitor becomes loose or falls off after 4 days call [employer] at [phone] for  suggestions on securing your  monitor    Follow-Up: At [facility], you and your health needs are our priority.  As part of our continuing mission to provide you with exceptional heart care, we have created designated Provider Care Teams.  These Care Teams include your primary Cardiologist (physician) and Advanced Practice Providers (APPs -  Physician Assistants and Nurse Practitioners) who all work together to provide you with the care you need, when you need it.  We recommend signing up for the patient portal called "MyChart".  Sign up information is provided on this After Visit Summary.  MyChart is used to connect with patients for Virtual Visits (Telemedicine).  Patients are able to view lab/test results, encounter notes, upcoming appointments, etc.  Non-urgent messages can be sent to your provider as well.   To learn more about what you can do with MyChart, go to [email or web address].    Your next appointment:   1 year(s)  The format for your next appointment:   In Person  Provider:   Rollene Rotunda, MD   Thank you for choosing [facility]!!

## 2021-10-01 DIAGNOSIS — R002 Palpitations: Secondary | ICD-10-CM

## 2021-10-20 ENCOUNTER — Encounter: Payer: Self-pay | Admitting: *Deleted

## 2021-11-28 ENCOUNTER — Encounter (HOSPITAL_COMMUNITY): Payer: Self-pay | Admitting: *Deleted

## 2021-11-28 ENCOUNTER — Emergency Department (HOSPITAL_COMMUNITY)
Admission: EM | Admit: 2021-11-28 | Discharge: 2021-11-28 | Disposition: A | Discharge status: Home / Self Care | Payer: Medicare Other | Attending: Emergency Medicine | Admitting: Emergency Medicine

## 2021-11-28 ENCOUNTER — Emergency Department (HOSPITAL_COMMUNITY): Payer: Medicare Other

## 2021-11-28 DIAGNOSIS — R739 Hyperglycemia, unspecified: Secondary | ICD-10-CM | POA: Insufficient documentation

## 2021-11-28 DIAGNOSIS — Z7982 Long term (current) use of aspirin: Secondary | ICD-10-CM | POA: Insufficient documentation

## 2021-11-28 DIAGNOSIS — R0789 Other chest pain: Secondary | ICD-10-CM | POA: Diagnosis not present

## 2021-11-28 DIAGNOSIS — Z79899 Other long term (current) drug therapy: Secondary | ICD-10-CM | POA: Diagnosis not present

## 2021-11-28 DIAGNOSIS — Z7952 Long term (current) use of systemic steroids: Secondary | ICD-10-CM | POA: Diagnosis not present

## 2021-11-28 DIAGNOSIS — J029 Acute pharyngitis, unspecified: Secondary | ICD-10-CM | POA: Diagnosis not present

## 2021-11-28 DIAGNOSIS — Z20822 Contact with and (suspected) exposure to covid-19: Secondary | ICD-10-CM | POA: Insufficient documentation

## 2021-11-28 DIAGNOSIS — R051 Acute cough: Secondary | ICD-10-CM | POA: Diagnosis present

## 2021-11-28 DIAGNOSIS — Z7984 Long term (current) use of oral hypoglycemic drugs: Secondary | ICD-10-CM | POA: Diagnosis not present

## 2021-11-28 DIAGNOSIS — R0981 Nasal congestion: Secondary | ICD-10-CM | POA: Insufficient documentation

## 2021-11-28 DIAGNOSIS — Z8616 Personal history of COVID-19: Secondary | ICD-10-CM | POA: Insufficient documentation

## 2021-11-28 LAB — BASIC METABOLIC PANEL
Anion gap: 12 (ref 5–15)
BUN: 20 mg/dL (ref 8–23)
CO2: 25 mmol/L (ref 22–32)
Calcium: 9 mg/dL (ref 8.9–10.3)
Chloride: 95 mmol/L — ABNORMAL LOW (ref 98–111)
Creatinine, Ser: 1.57 mg/dL — ABNORMAL HIGH (ref 0.61–1.24)
GFR, Estimated: 48 mL/min — ABNORMAL LOW (ref >60)
Glucose, Bld: 404 mg/dL — ABNORMAL HIGH (ref 70–99)
Potassium: 3.9 mmol/L (ref 3.5–5.1)
Sodium: 132 mmol/L — ABNORMAL LOW (ref 135–145)

## 2021-11-28 LAB — POC SARS CORONAVIRUS 2 AG -  ED: SARS Coronavirus 2 Ag: NEGATIVE

## 2021-11-28 LAB — CBC
HCT: 35 % — ABNORMAL LOW (ref 39.0–52.0)
Hemoglobin: 11 g/dL — ABNORMAL LOW (ref 13.0–17.0)
MCH: 26.8 pg (ref 26.0–34.0)
MCHC: 31.4 g/dL (ref 30.0–36.0)
MCV: 85.2 fL (ref 80.0–100.0)
Platelets: 406 10*3/uL — ABNORMAL HIGH (ref 150–400)
RBC: 4.11 MIL/uL — ABNORMAL LOW (ref 4.22–5.81)
RDW: 16.9 % — ABNORMAL HIGH (ref 11.5–15.5)
WBC: 6.2 10*3/uL (ref 4.0–10.5)
nRBC: 0 % (ref 0.0–0.2)

## 2021-11-28 LAB — CBG MONITORING, ED: Glucose-Capillary: 202 mg/dL — ABNORMAL HIGH (ref 70–99)

## 2021-11-28 LAB — TROPONIN I (HIGH SENSITIVITY)
Troponin I (High Sensitivity): 8 ng/L (ref <18)
Troponin I (High Sensitivity): 8 ng/L (ref <18)

## 2021-11-28 MED ORDER — SODIUM CHLORIDE 0.9 % IV BOLUS
1000.0000 mL | Freq: Once | INTRAVENOUS | Status: AC
  Administered 2021-11-28: 1000 mL via INTRAVENOUS

## 2021-11-28 MED ORDER — BENZONATATE 100 MG PO CAPS
200.0000 mg | ORAL_CAPSULE | Freq: Once | ORAL | Status: AC
Start: 2021-11-28 — End: 2021-11-28
  Administered 2021-11-28: 200 mg via ORAL
  Filled 2021-11-28: qty 2

## 2021-11-28 MED ORDER — INSULIN ASPART 100 UNIT/ML IJ SOLN
10.0000 [IU] | Freq: Once | INTRAMUSCULAR | Status: AC
  Administered 2021-11-28: 10 [IU] via SUBCUTANEOUS
  Filled 2021-11-28: qty 1

## 2021-11-28 MED ORDER — BENZONATATE 100 MG PO CAPS: 200.0000 mg | ORAL_CAPSULE | Freq: Three times a day (TID) | ORAL | 0 refills | Status: DC | PRN

## 2021-11-28 NOTE — ED Triage Notes (Signed)
Cough with chest pain x 2 weeks

## 2021-11-28 NOTE — Discharge Instructions (Addendum)
I suspect your cough is secondary to a viral URI.  Your COVID antigen test today is negative which tells [place] that you do not have a new COVID infection.  Continue using the Tessalon Perles to help you with your cough.  As discussed, your blood glucose level was very elevated today, probably due to the sugary drink and the M&Ms you ate prior to arriving here.,  Keep a close watch on your blood glucose levels.  Also you did have some evidence of dehydration today as reflected in one of your kidney tests.  Your creatinine specifically is elevated at 1.57.  I strongly encourage you to contact your primary doctor for repeat creatinine blood test by the end of this week to ensure that this number is improving.  Make sure you are drinking plenty of fluids.

## 2021-11-28 NOTE — ED Provider Notes (Signed)
[facility] EMERGENCY DEPARTMENT Provider Note   CSN: [id number] Arrival date & time: 11/28/21  1453     History  Chief Complaint  Patient presents with   Cough    Matthew Cain is a 66 y.o. male presenting for evaluation of a cough which has been present for the past 2 weeks.  Patient was diagnosed with COVID proximately 6 weeks ago during which time he had classic symptoms including flulike symptoms, body aches, fever and nonproductive cough.  He has completely improved from this infection until he started developing cough 2 weeks ago.  His symptoms started with nasal congestion, clear rhinorrhea along with a sore throat and new nonproductive cough.  His nasal and sinus symptoms have resolved but he continues to have cough.  He is concerned for possible recurrence of COVID although he denies any obvious new exposures.  He has had no shortness of breath no fevers or chills.  He denies nausea or vomiting, he does have some upper chest pain which is triggered by cough, resolved when not coughing.  He has found no alleviators for symptoms.    The history is provided by the patient.      Home Medications Prior to Admission medications   Medication Sig Start Date End Date Taking? Authorizing Provider  benzonatate (TESSALON) 100 MG capsule Take 2 capsules (200 mg total) by mouth 3 (three) times daily as needed. 11/28/21  Yes Tyreonna Czaplicki, Almyra Free, PA-C  acetaminophen (TYLENOL) 500 MG tablet Take 500 mg by mouth 2 (two) times daily.    [provider]  amitriptyline (ELAVIL) 10 MG tablet Take 20 mg by mouth at bedtime. 11/14/19   [provider]  amLODipine (NORVASC) 10 MG tablet Take by mouth. 03/07/21   [provider]  amLODipine (NORVASC) 10 MG tablet Take 1 tablet (10 mg total) by mouth every morning. 09/28/21   Minus Breeding, MD  ascorbic acid (VITAMIN C) 1000 MG tablet Take by mouth.    [provider]  aspirin 81 MG EC tablet Take by mouth.    [provider]  atorvastatin (LIPITOR) 80 MG tablet Take 1 tablet by mouth at bedtime. 05/23/21   [provider]  BD PEN NEEDLE NANO 2ND GEN 32G X 4 MM MISC USE WITH LANTUS SOLOSTAR TWICE DAILY 07/01/21   [provider]  Bartolo Darter COVID-19 AG HOME TEST KIT Use as Directed on the Package 06/16/21   [provider]  carvedilol (COREG) 25 MG tablet Take 1 tablet (25 mg total) by mouth 2 (two) times daily. 09/28/21   Minus Breeding, MD  Cholecalciferol (VITAMIN D3) 125 MCG (5000 UT) CAPS Take 1 capsule by mouth daily.    [provider]  DULoxetine (CYMBALTA) 60 MG capsule Take 1 capsule by mouth daily. 10/04/18   [provider]  famotidine (PEPCID) 40 MG tablet Take 40 mg by mouth daily. 11/12/19   [provider]  ferrous sulfate 325 (65 FE) MG tablet Take 325 mg by mouth at bedtime.     [provider]  fluticasone (FLONASE) 50 MCG/ACT nasal spray Place 1 spray into both nostrils daily as needed.     [provider]  glipiZIDE (GLUCOTROL) 5 MG tablet Take 5 mg by mouth. Take two tablets by mouth twice daily 03/28/21 03/28/22  [provider]  ibuprofen (ADVIL,MOTRIN) 800 MG tablet Take 400-800 mg by mouth every 8 (eight) hours as needed for pain.     [provider]  insulin detemir (LEVEMIR FLEXTOUCH)  100 UNIT/ML FlexPen Inject into the skin. 60 units BID 06/24/21   [provider]  ipratropium (ATROVENT) 0.06 % nasal spray SMARTSIG:2 Spray(s) Both Nares Twice Daily PRN 09/08/21   [provider]  isosorbide mononitrate (IMDUR) 30 MG 24 hr tablet Take 1 tablet (30 mg total) by mouth daily. 09/28/21   Minus Breeding, MD  lisinopril (ZESTRIL) 40 MG tablet Take 1 tablet (40 mg total) by mouth daily. 09/28/21   Minus Breeding, MD  loratadine (CLARITIN) 10 MG tablet Take by mouth. Take 1 tablet daily    [provider]  meloxicam (MOBIC) 15 MG tablet Take 15 mg by mouth daily as  needed. Patient not taking: Reported on 09/28/2021    [provider]  metFORMIN (GLUCOPHAGE) 1000 MG tablet Take 1,000 mg by mouth 2 (two) times daily.    [provider]  montelukast (SINGULAIR) 10 MG tablet Take 1 tablet by mouth daily before breakfast. 04/26/21   [provider]  nitroGLYCERIN (NITROSTAT) 0.4 MG SL tablet Place 1 tablet (0.4 mg total) under the tongue every 5 (five) minutes as needed for chest pain. 11/20/19   Herminio Commons, MD  Omega-3 Fatty Acids (FISH OIL) 1000 MG CAPS Take 1 capsule by mouth in the morning and at bedtime.    [provider]  pantoprazole (PROTONIX) 40 MG tablet Take 1 tablet by mouth daily. 12/29/20 12/29/21  [provider]  spironolactone (ALDACTONE) 25 MG tablet Take 1 tablet (25 mg total) by mouth daily. 09/28/21   Minus Breeding, MD  tetrahydrozoline 0.05 % ophthalmic solution Place 2 drops into both eyes daily as needed. Patient not taking: Reported on 09/28/2021    [provider]      Allergies    Gabapentin and Tomato    Review of Systems   Review of Systems  Constitutional:  Negative for chills and fever.  HENT:  Positive for congestion, rhinorrhea and sore throat. Negative for ear pain, sinus pressure, trouble swallowing and voice change.   Eyes:  Negative for discharge.  Respiratory:  Positive for cough. Negative for shortness of breath, wheezing and stridor.   Cardiovascular:  Negative for chest pain.  Gastrointestinal:  Negative for abdominal pain, nausea and vomiting.  Genitourinary: Negative.   Musculoskeletal:  Negative for myalgias.   Physical Exam Updated Vital Signs BP (!) 154/82    Pulse 91    Temp 98.6 F (37 C) (Oral)    Resp 18    SpO2 98%  Physical Exam Constitutional:      Appearance: He is well-developed.  HENT:     Head: Normocephalic and atraumatic.     Right Ear: Tympanic membrane and ear canal normal.     Left Ear: Tympanic membrane and ear canal  normal.     Nose: Mucosal edema and rhinorrhea present.     Mouth/Throat:     Mouth: Mucous membranes are moist.     Pharynx: Oropharynx is clear. Uvula midline. No oropharyngeal exudate or posterior oropharyngeal erythema.     Tonsils: No tonsillar abscesses.  Eyes:     Conjunctiva/sclera: Conjunctivae normal.  Cardiovascular:     Rate and Rhythm: Normal rate.     Heart sounds: Normal heart sounds.  Pulmonary:     Effort: Pulmonary effort is normal. No respiratory distress.     Breath sounds: No wheezing or rales.     Comments: Frequent dry sounding cough. Abdominal:     Palpations: Abdomen is soft.     Tenderness:  There is no abdominal tenderness. There is no guarding.  Musculoskeletal:        General: Normal range of motion.     Right lower leg: No edema.     Left lower leg: No edema.  Skin:    General: Skin is warm and dry.     Findings: No rash.  Neurological:     Mental Status: He is alert and oriented to person, place, and time.    ED Results / Procedures / Treatments   Labs (all labs ordered are listed, but only abnormal results are displayed) Labs Reviewed  BASIC METABOLIC PANEL - Abnormal; Notable for the following components:      Result Value   Sodium 132 (*)    Chloride 95 (*)    Glucose, Bld 404 (*)    Creatinine, Ser 1.57 (*)    GFR, Estimated 48 (*)    All other components within normal limits  CBC - Abnormal; Notable for the following components:   RBC 4.11 (*)    Hemoglobin 11.0 (*)    HCT 35.0 (*)    RDW 16.9 (*)    Platelets 406 (*)    All other components within normal limits  CBG MONITORING, ED - Abnormal; Notable for the following components:   Glucose-Capillary 202 (*)    All other components within normal limits  POC SARS CORONAVIRUS 2 AG -  ED - Normal  TROPONIN I (HIGH SENSITIVITY)  TROPONIN I (HIGH SENSITIVITY)    EKG EKG Interpretation  Date/Time:  Monday November 28 2021 15:35:15 EST Ventricular Rate:  97 PR Interval:  196 QRS  Duration: 128 QT Interval:  376 QTC Calculation: 477 R Axis:   -46 Text Interpretation: Normal sinus rhythm Left axis deviation Non-specific intra-ventricular conduction block Minimal voltage criteria for LVH, may be normal variant ( Cornell product ) Cannot rule out Septal infarct , age undetermined Abnormal ECG When compared with ECG of 16-Nov-2019 22:35, PREVIOUS ECG IS PRESENT Confirmed by Varney Biles [phone]) on 11/29/2021 2:08:37 PM  Radiology No results found.  DG Chest 2 View  Result Date: 11/28/2021 CLINICAL DATA:  Chest pain EXAM: CHEST - 2 VIEW COMPARISON:  11/16/2019 FINDINGS: Cardiac and mediastinal contours are within normal limits. No focal pulmonary opacity. No pleural effusion or pneumothorax. No acute osseous abnormality. IMPRESSION: No acute cardiopulmonary process. Electronically Signed   By: Merilyn Baba M.D.   On: 11/28/2021 15:51    Procedures Procedures    Medications Ordered in ED Medications  benzonatate (TESSALON) capsule 200 mg (200 mg Oral Given 11/28/21 1835)  sodium chloride 0.9 % bolus 1,000 mL (0 mLs Intravenous Stopped 11/28/21 1954)  insulin aspart (novoLOG) injection 10 Units (10 Units Subcutaneous Given 11/28/21 1910)    ED Course/ Medical Decision Making/ A&P                           Medical Decision Making Patient with URI type symptoms with persistent cough, no shortness of breath with a recent COVID infection.  Amount and/or Complexity of Data Reviewed Labs: ordered.    Details: And antigen COVID test was run and is negative today, suggesting today's symptoms are viral but not COVID.  He did have a significant hyperglycemia, initially it was 404 without gap or an abnormal CO2.  He was given IV fluids along with regular insulin and he did have improvement in his glucose levels prior to discharge home.  Although they still remain elevated.  While discussing this he did report that he ate a bag of M&Ms and drink a sugared Gatorade in the waiting  room while waiting to be called back.  He was advised to watch his food choices carefully and to make sure he is taking his diabetes medications, advised close follow-up with his PCP.  We also discussed his mildly elevated creatinine 1.57 today.  I suspect that this is improved after receiving IV fluids but he was advised that he should have this blood test repeated by the end of the week and he should call his PCP to have this done.  Patient understands this plan. Radiology: ordered.    Details: Chest x-ray is clear, no pneumonia.  Risk Prescription drug management. Decision regarding hospitalization. Risk Details: Was felt stable for discharge home.  He was given a dose of Tessalon while here which greatly improved his cough symptom.  He was prescribed additional Tessalon for home use.  He will follow-up with his PCP this week for recheck of his blood glucose and his creatinine function.           Final Clinical Impression(s) / ED Diagnoses Final diagnoses:  Acute cough  Hyperglycemia    Rx / DC Orders ED Discharge Orders          Ordered    benzonatate (TESSALON) 100 MG capsule  3 times daily PRN        11/28/21 2018              Evalee Jefferson, PA-C 11/30/21 1813    Godfrey Pick, MD 12/01/21 1954

## 2021-11-28 NOTE — ED Notes (Signed)
Pt ambulated to restroom. 

## 2021-12-27 ENCOUNTER — Ambulatory Visit: Payer: Medicare Other | Admitting: Genetic Counselor

## 2021-12-27 ENCOUNTER — Other Ambulatory Visit: Payer: Self-pay

## 2021-12-30 NOTE — Progress Notes (Cosign Needed)
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 He tells me that for the last 5-6 years he has been having symptoms of dyspnea upon exertion, fatigue, occasional chest pressure and dizziness. He was seeing a cardiologist in [place] who referred him to Dr. Antoine Poche. Upon undergoing cardiac imaging studies he was found to have severe septal hypertrophy suggestive of HCM.   Traditional Risk Factors Matthew Cain was diagnosed with hypertension in his early 32s that is well-controlled with medication.   Family history Matthew Cain (III.1) has a 63 y.o. son (IV.1) who lives in [place] and was in the [employer] for 5 years, and a 56 y.o. daughter who is a Programme researcher, broadcasting/film/video in [place]. Both have not yet been screened for HCM. His 71 y.o. brother (III.2) is an alcoholic and the youngest brother (III.3) committed suicide at age 42. There are no reports of heart disease in his nephews and nieces (IV.3-IV.10).  Matthew Cain's father (II.6) died of a brain aneurysm at age 54 as did his paternal grandfather (I.1) in his 52s. His maternal aunts (II.1-II.3) lived to their 72s and paternal uncles died by suicide and complications from diabetes (II.4-II.5).  Matthew Cain's mother (II.7) died suddenly at home at age 12. He tells me that he went to see her on a 03/27/23 and found her dead on the floor on 28-Mar-2023 morning. He reports that she had an underlying heart condition but does not know the details. He is unsure if an autopsy was performed. There are no reports of overt heart disease amongst his other maternal aunts and uncles (II.8-II.11). Maternal grandfather (I.3) died of diabetes complications at 90 and grandmother (I.4) died of a heart attack at  27.  Genetics Matthew Cain was counseled on the genetics of hypertrophic cardiomyopathy (HCM). I explained to him that this is an autosomal dominant condition and hence his son and daughter have a 50% chance of inheriting HCM. I explained that first degree-relatives, namely siblings and children should seek regular surveillance for HCM.  Clinical screening involves echocardiogram and EKG at regular intervals, frequency is typically determined by age, with children in their teens being screened every 15-18 months and those over the age of 34 getting screened every 3-5 years. He verbalized understanding of this.   I discussed penetrance of HCM being incomplete i.e. not all individuals harboring a HCM mutation will present clinically with HCM, and age-related penetrance where clinical presentation of HCM increases with advanced age. Based on his family history, it is likely that he has inherited HCM from his mother who died suddenly at age 17. He verbalized understanding.   I also reviewed variable expression of HCM and emphasized that this condition can express at any age at any level of severity in the family. Hence, it is important for first-degree relatives to stay vigilant and seek regular surveillance for HCM. He verbalized understanding of this. I informed him that some patients - about 8-10% can have compound and digenic mutations for HCM. Also briefly discussed the inheritance pattern and treatment /management plans for the infiltrative cardiomyopathies that present as HCM phenocopies.   We walked through the process of genetic testing.   I explained to her that genetic testing is  a probabilistic test dependent upon his age and severity of presentation, presence of risk factors for HCM and importantly family history of HCM or sudden death in first-degree relatives.  The potential outcomes of genetic testing and subsequent management of at-risk family members were discussed so as to manage  expectations-  I explained to him that if a mutation is not identified, then all first-degree relatives should undergo regular screening for HCM. I emphasized that even if the genetic test is negative, it does not mean that he does not have HCM. A negative test result can be due to limitations of the genetic test. He verbalized understanding of this.  There is also the likelihood of identifying a Variant of unknown significance. This result means that the variant has not been detected in a statistically significant number of HCM patients and/or functional studies have not been performed to verify its pathogenicity. This VUS can be tested in the family to see if it segregates with disease. If a VUS is found, first-degree relatives should undergo regular clinical screening for HCM.  If a pathogenic variant is reported, then his first-degree family members can get tested for this variant. If they test positive, it is likely they will develop HCM. In light of variable expression and incomplete penetrance associated with HCM, it is not possible to predict when they will manifest clinically with HCM. It is recommended that family members that test positive for the familial pathogenic variant pursue clinical screening for HCM. Family members that test negative for the familial mutation need not pursue periodic screening for HCM, but seek care if symptoms develop.   Impression  Matthew Cain was found to have cardiac Fine thickness suspicious of HCM at age 27. Additionally, he reports sudden death in a first-degree relative. Genetic testing for HCM is recommended. This test should include the major genes for HCM as well as the HCM phenocopies of Fabry disease, Danon disease, Wolf-Parkinson White syndrome and FTA. Genetic testing will confirm if he has a sarcomeric gene mutation or a HCM phenocopy.  In addition, we discussed the protections afforded by the Genetic Information Non-Discrimination Act (GINA). I explained to  him that GINA protects him from losing his employment or health insurance based on his genotype. However, these protections do not cover life insurance and disability. He verbalized understanding of this and states that he is not sure if his children have life insurance.  Please note that the patient has not been counseled in this visit on personal, cultural or ethical issues that she may face due to her heart condition.   Plan [place]'s insurance,  Medicaid will not cover his genetic testing for HCM.  I informed his of other labs that can perform the test at a very reduced price or at no cost but that his genetic data may likely be shared with other entities. He declines genetic testing for HCM at this time and tells me that he will reach out to [place] when he is ready to pursue genetic testing for HCM.   Sidney Ace, Ph.D, [facility] Clinical Molecular Geneticist

## 2022-01-31 ENCOUNTER — Ambulatory Visit (INDEPENDENT_AMBULATORY_CARE_PROVIDER_SITE_OTHER): Payer: Medicare Other | Admitting: Neurology

## 2022-01-31 ENCOUNTER — Encounter: Payer: Self-pay | Admitting: Neurology

## 2022-01-31 ENCOUNTER — Telehealth (HOSPITAL_COMMUNITY): Payer: Self-pay

## 2022-01-31 VITALS — BP 138/83 | HR 97 | Ht 70.5 in | Wt 224.0 lb

## 2022-01-31 DIAGNOSIS — H93A1 Pulsatile tinnitus, right ear: Secondary | ICD-10-CM | POA: Diagnosis not present

## 2022-01-31 NOTE — Patient Instructions (Signed)
I had a long discussion with the patient with regards to his pulsatile tinnitus and discussed cerebral catheter angiogram findings of jugular bulb diverticulum and I recommended endovascular treatment for removal of this which should help his tinnitus.  D/W  Dr. Tommi Rumps. Matthew Cain f .  Continue aspirin and Plavix for stroke prevention given history of history of significant cardiac disease and maintain aggressive risk factor modification with strict control of hypertension with blood pressure goal below 130/90, lipids with LDL cholesterol goal below 70 mg percent and diabetes with hemoglobin A1c goal below 6.5%.  He was advised to eat a healthy diet and to exercise regularly and lose weight.Return for f/u in 6 months or call earlier if needed. ?

## 2022-01-31 NOTE — Progress Notes (Signed)
?[facility] ?[address] ?[place] [address] ?[phone] ? ?     OFFICE FOLLOW UP VISIT NOTE ? ?Matthew Cain ?Date of Birth:  1956/09/14 ?Medical Record Number:  [id number]  ? ?Referring MD: Nanda Quinton ? ?Reason for Referral: Tinnitus ? ?HPI: Initial visit 05/26/2021 Mr. Tecson is a 66 year old African-American male with past medical history of diabetes, hypertension, hyperlipidemia, congestive heart failure, anxiety, renal insufficiency, coronary artery disease right hemispheric infarct in 2015.  He is seen today for evaluation for new complaint of right ear pulsatile tinnitus for a month.  History is obtained from the patient and review of electronic medical records and personally reviewed available imaging films in PACS.  Patient states he developed sudden onset of pulsatile tinnitus in the right ear about a month ago.  He is unable to attribute to any obvious triggers.  He states he hears his heartbeat in his ear.  It is more prominent when he is inactive sitting or lying down.  He has to sleep lying on his right side.  Putting pressure on the right side of the neck seems to make this feeling temporary suppressed.  He denies any hearing loss, vertigo, gait or balance problems.  He denies any recurrent ear infections or recent trauma or head injury.  He underwent CT angiogram of the brain and neck on 05/23/2021 which I personally reviewed shows multifocal atherosclerotic changes with 40% right proximal ICA, 50% left proximal ICA, moderate left cavernous ICA stenosis as well as severe distal right M1, right A2 stenosis and moderate bilateral vertebral artery stenosis and V4 segment and right posterior cerebral artery in the P2 segment.  CT scan of the head on 11/16/2019 showed no acute abnormalities.  Patient has prior history of right hemispheric infarct in 2015 with left-sided weakness and numbness.  Was felt to be probably due to small vessel disease though I do not have those records  to review today.  He was placed on aspirin and he was participating in the [employer] stroke prevention study.  He was lost to follow-up.  States he had no recurrent stroke or TIA symptoms since then.  He states he had his third shot of [employer] vaccine on 09/10/2020 had no major side effects from that.  Getting ready to get his fourth COVID booster shot soon.  States his blood pressure is under good control though today it is elevated at 148/75.  Remains on Lipitor which is tolerating well without muscle aches and pains.  His diabetes control remains poor and last hemoglobin A1c was 9.3 a month ago.  He is tolerating aspirin without side effects ?Update 09/26/2021 : He returns for follow-up after last visit with me 4 months ago.  Continues to have pulsatile tinnitus which can be suppressed with pressure on the lower part of the right neck.  He says occasionally he gets tinnitus in the left side as well.  He underwent diagnostic cerebral catheter angiogram on 06/03/2021 by Dr. 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  There was diffuse luminal irregularity of the right anterior circulation consistent with intracranial atherosclerotic disease with focal areas of moderate stenosis in the distal right M1 and right A3 ACA segment.  1.5 x 1.2 cm venous outpouching was noted in the jugular bulb on the right.  There was moderate stenosis at the origin of the right vertebral artery and mild stenosis at V1 V2 junction.  There were mild luminal irregularities of left carotid siphon,  bilateral ACA and MCA severe stenosis or the origin of the left A1 ACA segment and moderate stenosis of the A2 A3 junction.  There is moderate stenosis of distal P2 segment on the left as well.  Patient underwent MRI scan of the brain on 06/10/2021 which showed small punctate infarcts involving bilateral cerebellum, right temporal, right frontal and parietal regions these are likely small incidental silent  infarcts which was procedure to complication of the angiogram a week before.  Patient had no corresponding clinical symptoms of stroke at that time.  Lab work on 07/06/2021 showed elevated A1c of 9.8 and LDL cholesterol on 08/05/2021 was quite low at 22 and triglycerides were elevated at 212.  Patient states that he is working with primary care physician to getting his diabetes under better control.  His blood pressure is well controlled.  Tolerating Lipitor well without muscle aches and pains.  He is change his eating habits and is trying to lose weight.  He remains on aspirin and Plavix following his cardiac stents and started in both well without bruising or bleeding.  He has no new complaints today. ?Update 01/31/2022 : He returns for follow-up after last visit 4 months ago.  He continues to have pulsatile tinnitus in his right ear which is constant.  He does reduce somewhat with he put some pressure on the right side of the neck on [place] his neck to the right shoulder.  He did have COVID infection in January but fortunately has recovered very well.  He had hemoglobin A1c checked on 12/13/2021 and was quite elevated at 10.4.  He is working with primary care physician to bring his sugar down.  His blood pressure is doing well and today it is 138/83.  He is tolerating Lipitor well without side effects.  Patient has not yet seen Dr. Norma Fredrickson to have endovascular treatment for his jugular diverticulum to treat his tinnitus ?ROS:   ?14 system review of systems is positive for tinnitus, ringing sound in the ears all other systems negative ? ?PMH:  ?Past Medical History:  ?Diagnosis Date  ? Amputation finger   ? left ring finger  ? Anxiety   ? CAD (coronary artery disease)   ? LHC 10/12:  mild plaque in the LAD but no obstructive CAD  ? Chronic diastolic heart failure ([place])   ? EF 0000000, grade 1 diastolic dysfunction; no WMAs, echo, 11/2011; s/p pulmo. edema c/b VDRF 11/12; Echo 09/08/11: severe LVH, no SAM, no LVOT  gradient, EF 55%, mild BAE, mild reduced RVSF.  ? CKD (chronic kidney disease)   ? Depression   ? Diabetes mellitus   ? A1C 7.3 08/2011  ? GERD (gastroesophageal reflux disease)   ? History of kidney stones   ? History of right ACA stroke   ? 10/12  --  MRI 09/13/11:  Acute right ACA infarct involving corpus callosum.  MRA 09/13/11: severe intracranial ASD, right ACA occluded, left PCA and right MCA with severe disease.  Dopplers were neg for significant ICA stenosis  ? HLD (hyperlipidemia)   ? Hypertension   ? Renal insufficiency   ? Spermatocele   ? Thoracic aortic aneurysm   ? CT in 10/12: 4.4 cm ascending thoracic aortic aneurysm  ? ? ?Social History:  ?Social History  ? ?Socioeconomic History  ? Marital status: Divorced  ?  Spouse name: Not on file  ? Number of children: Not on file  ? Years of education: Not on file  ? Highest education level:  Not on file  ?Occupational History  ? Not on file  ?Tobacco Use  ? Smoking status: Former  ?  Packs/day: 1.00  ?  Years: 30.00  ?  Pack years: 30.00  ?  Types: Cigarettes  ?  Quit date: 11/13/1994  ?  Years since quitting: 27.2  ? Smokeless tobacco: Never  ?Vaping Use  ? Vaping Use: Never used  ?Substance and Sexual Activity  ? Alcohol use: No  ? Drug use: No  ? Sexual activity: Not Currently  ?Other Topics Concern  ? Not on file  ?Social History Narrative  ? Lives alone  ? Right Handed  ? Drinks 2-3 cups caffeine daily  ? ?Social Determinants of Health  ? ?Financial Resource Strain: Not on file  ?Food Insecurity: Not on file  ?Transportation Needs: Not on file  ?Physical Activity: Not on file  ?Stress: Not on file  ?Social Connections: Not on file  ?Intimate Partner Violence: Not on file  ? ? ?Medications:   ?Current Outpatient Medications on File Prior to Visit  ?Medication Sig Dispense Refill  ? acetaminophen (TYLENOL) 500 MG tablet Take 500 mg by mouth 2 (two) times daily.    ? amitriptyline (ELAVIL) 10 MG tablet Take 20 mg by mouth at bedtime.    ? amLODipine  (NORVASC) 10 MG tablet Take by mouth.    ? amLODipine (NORVASC) 10 MG tablet Take 1 tablet (10 mg total) by mouth every morning. 90 tablet 3  ? ascorbic acid (VITAMIN C) 1000 MG tablet Take by mouth.    ? aspirin 81 MG

## 2022-01-31 NOTE — Telephone Encounter (Signed)
Called to schedule f/u, no answer, vm full. AW  

## 2022-02-11 ENCOUNTER — Other Ambulatory Visit: Payer: Self-pay | Admitting: Cardiology

## 2022-02-11 DIAGNOSIS — I10 Essential (primary) hypertension: Secondary | ICD-10-CM

## 2022-02-13 NOTE — Telephone Encounter (Signed)
The original prescription was discontinued on 09/26/2021 by Rosezella [place], CMA for the following reason: Error. ?

## 2022-03-21 ENCOUNTER — Other Ambulatory Visit: Payer: Self-pay | Admitting: Otolaryngology

## 2022-03-21 DIAGNOSIS — H93A2 Pulsatile tinnitus, left ear: Secondary | ICD-10-CM

## 2022-04-04 ENCOUNTER — Telehealth (HOSPITAL_COMMUNITY): Payer: Self-pay

## 2022-04-04 NOTE — Telephone Encounter (Signed)
Called to schedule consult, no answer, left vm. AW  

## 2022-04-12 ENCOUNTER — Ambulatory Visit
Admission: RE | Admit: 2022-04-12 | Discharge: 2022-04-12 | Disposition: A | Discharge status: Home / Self Care | Payer: Medicare Other | Source: Ambulatory Visit | Attending: Otolaryngology | Admitting: Otolaryngology

## 2022-04-12 DIAGNOSIS — H93A2 Pulsatile tinnitus, left ear: Secondary | ICD-10-CM

## 2022-04-12 MED ORDER — GADOBENATE DIMEGLUMINE 529 MG/ML IV SOLN
20.0000 mL | Freq: Once | INTRAVENOUS | Status: AC | PRN
  Administered 2022-04-12: 20 mL via INTRAVENOUS

## 2022-04-21 ENCOUNTER — Other Ambulatory Visit: Payer: Self-pay | Admitting: Otolaryngology

## 2022-04-21 ENCOUNTER — Other Ambulatory Visit (HOSPITAL_COMMUNITY): Payer: Self-pay | Admitting: Otolaryngology

## 2022-04-21 DIAGNOSIS — H93A9 Pulsatile tinnitus, unspecified ear: Secondary | ICD-10-CM

## 2022-05-17 ENCOUNTER — Encounter: Payer: Self-pay | Admitting: Neurology

## 2022-05-17 ENCOUNTER — Telehealth: Payer: Self-pay | Admitting: Neurology

## 2022-05-17 NOTE — Telephone Encounter (Signed)
LVM, sent mychart msg and cx letter informing pt of r/s needed for 9/21 appt- MD out.

## 2022-05-25 ENCOUNTER — Ambulatory Visit (HOSPITAL_COMMUNITY): Admission: RE | Admit: 2022-05-25 | Payer: Medicare Other | Source: Ambulatory Visit

## 2022-05-25 ENCOUNTER — Encounter (HOSPITAL_COMMUNITY): Payer: Self-pay

## 2022-06-02 ENCOUNTER — Telehealth: Payer: Self-pay | Admitting: Cardiology

## 2022-06-02 NOTE — Telephone Encounter (Signed)
Patient c/o Palpitations:  High priority if patient c/o lightheadedness, shortness of breath, or chest pain  How long have you had palpitations/irregular HR/ Afib? Are you having the symptoms now?  Patient states for the past 6 months he can hear his heart beating in his left ear. He states this occurs every day and he can currently hear/feel it now. Saw a neurosurgeon and had testing, but everything was fine except for 1 vessel?   Are you currently experiencing lightheadedness, SOB or CP?  No   Do you have a history of afib (atrial fibrillation) or irregular heart rhythm?    Have you checked your BP or HR? (document readings if available):  120/89  110/79  Patient hasn't checked his HR   Are you experiencing any other symptoms?  No

## 2022-06-02 NOTE — Telephone Encounter (Signed)
LMTCB at [phone] and ask for the cardiologist on call regarding palpitations.

## 2022-06-04 DIAGNOSIS — I2510 Atherosclerotic heart disease of native coronary artery without angina pectoris: Secondary | ICD-10-CM | POA: Diagnosis not present

## 2022-06-04 DIAGNOSIS — I130 Hypertensive heart and chronic kidney disease with heart failure and stage 1 through stage 4 chronic kidney disease, or unspecified chronic kidney disease: Secondary | ICD-10-CM | POA: Insufficient documentation

## 2022-06-04 DIAGNOSIS — Z87891 Personal history of nicotine dependence: Secondary | ICD-10-CM | POA: Insufficient documentation

## 2022-06-04 DIAGNOSIS — R519 Headache, unspecified: Secondary | ICD-10-CM | POA: Insufficient documentation

## 2022-06-04 DIAGNOSIS — G8929 Other chronic pain: Secondary | ICD-10-CM | POA: Diagnosis not present

## 2022-06-04 DIAGNOSIS — E1122 Type 2 diabetes mellitus with diabetic chronic kidney disease: Secondary | ICD-10-CM | POA: Diagnosis not present

## 2022-06-04 DIAGNOSIS — N189 Chronic kidney disease, unspecified: Secondary | ICD-10-CM | POA: Insufficient documentation

## 2022-06-04 DIAGNOSIS — I5032 Chronic diastolic (congestive) heart failure: Secondary | ICD-10-CM | POA: Diagnosis not present

## 2022-06-05 ENCOUNTER — Encounter (HOSPITAL_COMMUNITY): Payer: Self-pay | Admitting: Emergency Medicine

## 2022-06-05 ENCOUNTER — Emergency Department (HOSPITAL_COMMUNITY)
Admission: EM | Admit: 2022-06-05 | Discharge: 2022-06-05 | Disposition: A | Discharge status: Home / Self Care | Payer: Medicare Other | Attending: Emergency Medicine | Admitting: Emergency Medicine

## 2022-06-05 DIAGNOSIS — R519 Headache, unspecified: Secondary | ICD-10-CM | POA: Diagnosis not present

## 2022-06-05 DIAGNOSIS — G8929 Other chronic pain: Secondary | ICD-10-CM

## 2022-06-05 MED ORDER — HALOPERIDOL LACTATE 5 MG/ML IJ SOLN
5.0000 mg | Freq: Once | INTRAMUSCULAR | Status: AC
  Administered 2022-06-05: 5 mg via INTRAMUSCULAR
  Filled 2022-06-05: qty 1

## 2022-06-05 NOTE — Discharge Instructions (Signed)
You were evaluated in the Emergency Department and after careful evaluation, we did not find any emergent condition requiring admission or further testing in the hospital.  Your exam/testing today is overall reassuring.  Recommend continued follow-up with your neurologist for management of your headaches.  Please return to the Emergency Department if you experience any worsening of your condition.   Thank you for allowing [place] to be a part of your care.

## 2022-06-05 NOTE — Telephone Encounter (Signed)
Patient is currently at the ED.  Will let them evaluated.  Will remove from call back list.  Thanks!

## 2022-06-05 NOTE — ED Provider Notes (Signed)
[facility]-EMERGENCY DEPT [facility] Emergency Department Provider Note MRN:  [id number]  Arrival date & time: 06/05/22     Chief Complaint   Headache   History of Present Illness   Winford Hehn is a 66 y.o. year-old male with a history of CAD presenting to the ED with chief complaint of headache.  Persistent left-sided frontal headache for months to years.  Pulsatile tinnitus of the left ear, also chronic.  For the past weeks to months having a lot of trouble sleeping because of the symptoms.  No significant changes recently, no nausea vomiting, no numbness or weakness to the arms or legs.  Review of Systems  A thorough review of systems was obtained and all systems are negative except as noted in the HPI and PMH.   Patient's Health History    Past Medical History:  Diagnosis Date   Amputation finger    left ring finger   Anxiety    CAD (coronary artery disease)    LHC 10/12:  mild plaque in the LAD but no obstructive CAD   Chronic diastolic heart failure (HCC)    EF 55-60%, grade 1 diastolic dysfunction; no WMAs, echo, 11/2011; s/p pulmo. edema c/b VDRF 11/12; Echo 09/08/11: severe LVH, no SAM, no LVOT gradient, EF 55%, mild BAE, mild reduced RVSF.   CKD (chronic kidney disease)    Depression    Diabetes mellitus    A1C 7.3 08/2011   GERD (gastroesophageal reflux disease)    History of kidney stones    History of right ACA stroke    10/12  --  MRI 09/13/11:  Acute right ACA infarct involving corpus callosum.  MRA 09/13/11: severe intracranial ASD, right ACA occluded, left PCA and right MCA with severe disease.  Dopplers were neg for significant ICA stenosis   HLD (hyperlipidemia)    Hypertension    Renal insufficiency    Spermatocele    Thoracic aortic aneurysm (HCC)    CT in 10/12: 4.4 cm ascending thoracic aortic aneurysm    Past Surgical History:  Procedure Laterality Date    vasectomy     bilateral   CARDIAC CATHETERIZATION  09/09/11   IR ANGIO EXTERNAL CAROTID SEL  EXT CAROTID UNI R MOD SED  06/03/2021   IR ANGIO INTRA EXTRACRAN SEL COM CAROTID INNOMINATE UNI L MOD SED  06/03/2021   IR ANGIO INTRA EXTRACRAN SEL INTERNAL CAROTID UNI R MOD SED  06/03/2021   IR ANGIO VERTEBRAL SEL VERTEBRAL BILAT MOD SED  06/03/2021   IR US GUIDE VASC ACCESS RIGHT  06/03/2021   ROTATOR CUFF REPAIR  07/2015   LEFT   SPERMATOCELECTOMY  1990's   RT side   SPERMATOCELECTOMY Left 07/20/2015   Procedure: LEFT HYDROCELECTOMY;  Surgeon: Bjorn Pippin, MD;  Location: [facility];  Service: Urology;  Laterality: Left;   TRANSURETHRAL INCISION OF PROSTATE N/A 07/20/2015   Procedure: TRANSURETHRAL INCISION OF THE PROSTATE (TUIP);  Surgeon: Bjorn Pippin, MD;  Location: [facility];  Service: Urology;  Laterality: N/A;    Family History  Problem Relation Age of Onset   Heart attack Mother    Cerebral aneurysm Father    Cerebral aneurysm Other    Diabetes Other    Hypertension Other     Social History   Socioeconomic History   Marital status: Divorced    Spouse name: Not on file   Number of children: Not on file   Years of education: Not on file   Highest education level: Not on file  Occupational History   Not on file  Tobacco Use   Smoking status: Former    Packs/day: 1.00    Years: 30.00    Total pack years: 30.00    Types: Cigarettes    Quit date: 11/13/1994    Years since quitting: 27.5   Smokeless tobacco: Never  Vaping Use   Vaping Use: Never used  Substance and Sexual Activity   Alcohol use: No   Drug use: No   Sexual activity: Not Currently  Other Topics Concern   Not on file  Social History Narrative   Lives alone   Right Handed   Drinks 2-3 cups caffeine daily   Social Determinants of Health   Financial Resource Strain: Not on file  Food Insecurity: Not on file  Transportation Needs: Not on file  Physical Activity: Not on file  Stress: Not on file  Social Connections: Not on file  Intimate Partner Violence: Not on file     Physical Exam   Vitals:   06/05/22  0030 06/05/22 0100  BP: (!) 158/50 (!) 145/66  Pulse: 95 89  Resp: 18 16  Temp: 97.8 F (36.6 C)   SpO2: 97% 97%    CONSTITUTIONAL: Well-appearing, NAD NEURO/PSYCH:  Alert and oriented x 3, normal and symmetric strength and sensation, normal coordination, normal speech EYES:  eyes equal and reactive ENT/NECK:  no LAD, no JVD CARDIO: Regular rate, well-perfused, normal S1 and S2 PULM:  CTAB no wheezing or rhonchi GI/GU:  non-distended, non-tender MSK/SPINE:  No gross deformities, no edema SKIN:  no rash, atraumatic   *Additional and/or pertinent findings included in MDM below  Diagnostic and Interventional Summary    EKG Interpretation  Date/Time:    Ventricular Rate:    PR Interval:    QRS Duration:   QT Interval:    QTC Calculation:   R Axis:     Text Interpretation:         Labs Reviewed - No data to display  No orders to display    Medications  haloperidol lactate (HALDOL) injection 5 mg (5 mg Intramuscular Given 06/05/22 0136)     Procedures  /  Critical Care Procedures  ED Course and Medical Decision Making  Initial Impression and Ddx Chronic headache and pulsatile tinnitus for over a year, July of last year he had a CTA head and neck for the same complaint which did not show a cause.  He has since had a fairly recent MRI that was normal.  Without any changes recently there is no indication for further testing or admission.  Haldol attempted, minimal improvement.  He will follow-up with neurology over the next few days.  Past medical/surgical history that increases complexity of ED encounter: Chronic headache, CAD  Interpretation of Diagnostics Laboratory and/or imaging options to aid in the diagnosis/care of the patient were considered.  After careful history and physical examination, it was determined that there was no indication for diagnostics at this time.  Patient Reassessment and Ultimate Disposition/Management     Discharge  Patient management  required discussion with the following services or consulting groups:  None  Complexity of Problems Addressed Acute illness or injury that poses threat of life of bodily function  Additional Data Reviewed and Analyzed Further history obtained from: Prior labs/imaging results  Additional Factors Impacting ED Encounter Risk None  Barth Kirks. Sedonia Small, MD [place] [email or web address]  Final Clinical Impressions(s) / ED Diagnoses     ICD-10-CM  1. Chronic intractable headache, unspecified headache type  R51.9    G89.29       ED Discharge Orders     None        Discharge Instructions Discussed with and Provided to Patient:    Discharge Instructions      You were evaluated in the Emergency Department and after careful evaluation, we did not find any emergent condition requiring admission or further testing in the hospital.  Your exam/testing today is overall reassuring.  Recommend continued follow-up with your neurologist for management of your headaches.  Please return to the Emergency Department if you experience any worsening of your condition.   Thank you for allowing [place] to be a part of your care.      Sabas Sous, MD 06/05/22 [phone]

## 2022-06-05 NOTE — ED Triage Notes (Signed)
Pt c/o left sided head pain for the past 6 months. Pt states he was seen here in the past for same and followed up with neuro. Pt states it gets to hurting and it wont let him sleep.

## 2022-06-13 ENCOUNTER — Encounter (HOSPITAL_COMMUNITY): Payer: Self-pay | Admitting: Emergency Medicine

## 2022-06-13 ENCOUNTER — Other Ambulatory Visit: Payer: Self-pay

## 2022-06-13 ENCOUNTER — Emergency Department (HOSPITAL_COMMUNITY)
Admission: EM | Admit: 2022-06-13 | Discharge: 2022-06-13 | Disposition: A | Discharge status: Home / Self Care | Payer: Medicare Other | Attending: Emergency Medicine | Admitting: Emergency Medicine

## 2022-06-13 ENCOUNTER — Emergency Department (HOSPITAL_COMMUNITY): Payer: Medicare Other

## 2022-06-13 DIAGNOSIS — E1122 Type 2 diabetes mellitus with diabetic chronic kidney disease: Secondary | ICD-10-CM | POA: Insufficient documentation

## 2022-06-13 DIAGNOSIS — I6782 Cerebral ischemia: Secondary | ICD-10-CM | POA: Diagnosis not present

## 2022-06-13 DIAGNOSIS — I2510 Atherosclerotic heart disease of native coronary artery without angina pectoris: Secondary | ICD-10-CM | POA: Insufficient documentation

## 2022-06-13 DIAGNOSIS — H93A9 Pulsatile tinnitus, unspecified ear: Secondary | ICD-10-CM | POA: Diagnosis not present

## 2022-06-13 DIAGNOSIS — Z794 Long term (current) use of insulin: Secondary | ICD-10-CM | POA: Insufficient documentation

## 2022-06-13 DIAGNOSIS — N182 Chronic kidney disease, stage 2 (mild): Secondary | ICD-10-CM | POA: Insufficient documentation

## 2022-06-13 DIAGNOSIS — Z7984 Long term (current) use of oral hypoglycemic drugs: Secondary | ICD-10-CM | POA: Diagnosis not present

## 2022-06-13 DIAGNOSIS — Z7982 Long term (current) use of aspirin: Secondary | ICD-10-CM | POA: Insufficient documentation

## 2022-06-13 DIAGNOSIS — Z87442 Personal history of urinary calculi: Secondary | ICD-10-CM | POA: Diagnosis not present

## 2022-06-13 DIAGNOSIS — D509 Iron deficiency anemia, unspecified: Secondary | ICD-10-CM | POA: Insufficient documentation

## 2022-06-13 DIAGNOSIS — I129 Hypertensive chronic kidney disease with stage 1 through stage 4 chronic kidney disease, or unspecified chronic kidney disease: Secondary | ICD-10-CM | POA: Insufficient documentation

## 2022-06-13 DIAGNOSIS — H9319 Tinnitus, unspecified ear: Secondary | ICD-10-CM | POA: Diagnosis present

## 2022-06-13 DIAGNOSIS — Z79899 Other long term (current) drug therapy: Secondary | ICD-10-CM | POA: Diagnosis not present

## 2022-06-13 LAB — HEPATIC FUNCTION PANEL
ALT: 17 U/L (ref 0–44)
AST: 19 U/L (ref 15–41)
Albumin: 4 g/dL (ref 3.5–5.0)
Alkaline Phosphatase: 53 U/L (ref 38–126)
Bilirubin, Direct: <0.1 mg/dL (ref 0.0–0.2)
Total Bilirubin: <0.1 mg/dL — ABNORMAL LOW (ref 0.3–1.2)
Total Protein: 6.8 g/dL (ref 6.5–8.1)

## 2022-06-13 LAB — CBC WITH DIFFERENTIAL/PLATELET
Abs Immature Granulocytes: 0.02 10*3/uL (ref 0.00–0.07)
Basophils Absolute: 0.1 10*3/uL (ref 0.0–0.1)
Basophils Relative: 1 %
Eosinophils Absolute: 0.1 10*3/uL (ref 0.0–0.5)
Eosinophils Relative: 2 %
HCT: 20.3 % — ABNORMAL LOW (ref 39.0–52.0)
Hemoglobin: 5.4 g/dL — CL (ref 13.0–17.0)
Immature Granulocytes: 0 %
Lymphocytes Relative: 24 %
Lymphs Abs: 1.9 10*3/uL (ref 0.7–4.0)
MCH: 19.1 pg — ABNORMAL LOW (ref 26.0–34.0)
MCHC: 26.6 g/dL — ABNORMAL LOW (ref 30.0–36.0)
MCV: 72 fL — ABNORMAL LOW (ref 80.0–100.0)
Monocytes Absolute: 1.1 10*3/uL — ABNORMAL HIGH (ref 0.1–1.0)
Monocytes Relative: 14 %
Neutro Abs: 4.7 10*3/uL (ref 1.7–7.7)
Neutrophils Relative %: 59 %
Platelets: 593 10*3/uL — ABNORMAL HIGH (ref 150–400)
RBC: 2.82 MIL/uL — ABNORMAL LOW (ref 4.22–5.81)
RDW: 19.8 % — ABNORMAL HIGH (ref 11.5–15.5)
WBC: 7.9 10*3/uL (ref 4.0–10.5)
nRBC: 0.3 % — ABNORMAL HIGH (ref 0.0–0.2)

## 2022-06-13 LAB — BASIC METABOLIC PANEL
Anion gap: 9 (ref 5–15)
BUN: 13 mg/dL (ref 8–23)
CO2: 23 mmol/L (ref 22–32)
Calcium: 9.6 mg/dL (ref 8.9–10.3)
Chloride: 105 mmol/L (ref 98–111)
Creatinine, Ser: 1.33 mg/dL — ABNORMAL HIGH (ref 0.61–1.24)
GFR, Estimated: 59 mL/min — ABNORMAL LOW (ref >60)
Glucose, Bld: 169 mg/dL — ABNORMAL HIGH (ref 70–99)
Potassium: 3.8 mmol/L (ref 3.5–5.1)
Sodium: 137 mmol/L (ref 135–145)

## 2022-06-13 LAB — URINALYSIS, ROUTINE W REFLEX MICROSCOPIC
Bilirubin Urine: NEGATIVE
Glucose, UA: NEGATIVE mg/dL
Hgb urine dipstick: NEGATIVE
Ketones, ur: NEGATIVE mg/dL
Leukocytes,Ua: NEGATIVE
Nitrite: NEGATIVE
Protein, ur: NEGATIVE mg/dL
Specific Gravity, Urine: 1.01 (ref 1.005–1.030)
pH: 6 (ref 5.0–8.0)

## 2022-06-13 LAB — ABO/RH: ABO/RH(D): B POS

## 2022-06-13 LAB — I-STAT CHEM 8, ED
BUN: 13 mg/dL (ref 8–23)
Calcium, Ion: 1.25 mmol/L (ref 1.15–1.40)
Chloride: 103 mmol/L (ref 98–111)
Creatinine, Ser: 1.2 mg/dL (ref 0.61–1.24)
Glucose, Bld: 136 mg/dL — ABNORMAL HIGH (ref 70–99)
HCT: 19 % — ABNORMAL LOW (ref 39.0–52.0)
Hemoglobin: 6.5 g/dL — CL (ref 13.0–17.0)
Potassium: 3.8 mmol/L (ref 3.5–5.1)
Sodium: 139 mmol/L (ref 135–145)
TCO2: 25 mmol/L (ref 22–32)

## 2022-06-13 LAB — FERRITIN: Ferritin: 4 ng/mL — ABNORMAL LOW (ref 24–336)

## 2022-06-13 LAB — IRON AND TIBC
Iron: 11 ug/dL — ABNORMAL LOW (ref 45–182)
Saturation Ratios: 3 % — ABNORMAL LOW (ref 17.9–39.5)
TIBC: 421 ug/dL (ref 250–450)
UIBC: 410 ug/dL

## 2022-06-13 LAB — POC OCCULT BLOOD, ED: Fecal Occult Bld: NEGATIVE

## 2022-06-13 LAB — VITAMIN B12: Vitamin B-12: 252 pg/mL (ref 180–914)

## 2022-06-13 LAB — PREPARE RBC (CROSSMATCH)

## 2022-06-13 LAB — FOLATE: Folate: 16.1 ng/mL (ref >5.9)

## 2022-06-13 MED ORDER — SODIUM CHLORIDE 0.9% IV SOLUTION: Freq: Once | INTRAVENOUS | Status: AC

## 2022-06-13 NOTE — ED Provider Notes (Signed)
[facility] Provider Note  CSN: [id number] Arrival date & time: 06/13/22 0250  Chief Complaint(s) Tinnitus  HPI Matthew Cain is a 66 y.o. male with a past medical history listed below including hypertension, hyperlipidemia, diabetes, jugular diverticulum causing pulsatile tinnitus who presents to the emergency who presents to the emergency department for persistent tinnitus which kept him from sleeping tonight.   Additionally he reports 3-4 months for generalized fatigue and DOE. He denies any recent fevers or infections.  No chest pain. Patient is on iron supplements and endorses intermittent black stools but no melena or hematochezia. Patient is on aspirin and Plavix and no other anticoagulation.  HPI  Past Medical History Past Medical History:  Diagnosis Date   Amputation finger    left ring finger   Anxiety    CAD (coronary artery disease)    LHC 10/12:  mild plaque in the LAD but no obstructive CAD   Chronic diastolic heart failure (HCC)    EF 40-98%, grade 1 diastolic dysfunction; no WMAs, echo, 11/2011; s/p pulmo. edema c/b VDRF 11/12; Echo 09/08/11: severe LVH, no SAM, no LVOT gradient, EF 55%, mild BAE, mild reduced RVSF.   CKD (chronic kidney disease)    Depression    Diabetes mellitus    A1C 7.3 08/2011   GERD (gastroesophageal reflux disease)    History of kidney stones    History of right ACA stroke    10/12  --  MRI 09/13/11:  Acute right ACA infarct involving corpus callosum.  MRA 09/13/11: severe intracranial ASD, right ACA occluded, left PCA and right MCA with severe disease.  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[place]) 09/27/2021   At risk for obstructive sleep  apnea 08/24/2021   Cerebrovascular accident ([place]) 08/24/2021   RLS (restless legs syndrome) 08/24/2021   Intracranial atherosclerosis 06/03/2021   Cerebral artery occlusion without cerebral infarction 05/23/2021   Hypertensive heart disease without heart failure 09/06/2020   Educated about COVID-19 virus infection 07/08/2020   CKD stage 2 due to type 2 diabetes mellitus ([place]) 08/26/2016   Chest pain 08/25/2016   Hydrocele, left 07/21/2015   BPH with obstruction/lower urinary tract symptoms 07/20/2015   Chronic combined systolic and diastolic congestive heart failure ([place]) 12/03/2012   Palpitations 03/13/2012   Panic disorder 02/13/2012   Thoracic aneurysm 11/23/2011   Cerebral infarction ([place]) 09/17/2011   CAD (coronary artery disease), native coronary artery 09/17/2011   Essential hypertension 09/17/2011   Dyslipidemia 09/17/2011   Type II diabetes mellitus ([place]) 09/17/2011   Chronic kidney disease (CKD) 09/17/2011   Pneumonia 09/17/2011   Cerebral artery occlusion with cerebral infarction ([place]) 09/17/2011   Hyperlipidemia 09/17/2011   Myocardial infarction ([place]) 07/15/2011   Home Medication(s) Prior to Admission medications   Medication Sig Start Date End Date Taking? Authorizing Provider  acetaminophen (TYLENOL) 500 MG tablet Take 500 mg by mouth 2 (two) times daily.    [provider]  amitriptyline (ELAVIL) 10 MG tablet Take 20 mg by mouth at bedtime. 11/14/19   [provider]  amLODipine (NORVASC) 10 MG tablet Take by mouth. 03/07/21   [provider]  amLODipine (NORVASC) 10 MG tablet Take 1 tablet (  10 mg total) by mouth every morning. 09/28/21   Minus Breeding, MD  ascorbic acid (VITAMIN C) 1000 MG tablet Take by mouth.    [provider]  aspirin 81 MG EC tablet Take by mouth.    [provider]  atorvastatin (LIPITOR) 80 MG tablet Take 1 tablet by mouth at bedtime. 05/23/21   [provider]  BD PEN NEEDLE NANO 2ND GEN  32G X 4 MM MISC USE WITH LANTUS SOLOSTAR TWICE DAILY 07/01/21   [provider]  Bartolo Darter COVID-19 AG HOME TEST KIT Use as Directed on the Package 06/16/21   [provider]  carvedilol (COREG) 25 MG tablet Take 1 tablet (25 mg total) by mouth 2 (two) times daily. 09/28/21   Minus Breeding, MD  Cholecalciferol (VITAMIN D3) 125 MCG (5000 UT) CAPS Take 1 capsule by mouth daily.    [provider]  DULoxetine (CYMBALTA) 60 MG capsule Take 1 capsule by mouth daily. 10/04/18   [provider]  famotidine (PEPCID) 40 MG tablet Take 40 mg by mouth daily. 11/12/19   [provider]  ferrous sulfate 325 (65 FE) MG tablet Take 325 mg by mouth at bedtime.     [provider]  ibuprofen (ADVIL,MOTRIN) 800 MG tablet Take 400-800 mg by mouth every 8 (eight) hours as needed for pain.     [provider]  insulin detemir (LEVEMIR FLEXTOUCH) 100 UNIT/ML FlexPen Inject into the skin. 60 units BID 06/24/21   [provider]  ipratropium (ATROVENT) 0.06 % nasal spray SMARTSIG:2 Spray(s) Both Nares Twice Daily PRN 09/08/21   [provider]  isosorbide mononitrate (IMDUR) 30 MG 24 hr tablet Take 1 tablet (30 mg total) by mouth daily. 09/28/21   Minus Breeding, MD  lisinopril (ZESTRIL) 40 MG tablet Take 1 tablet (40 mg total) by mouth daily. 09/28/21   Minus Breeding, MD  loratadine (CLARITIN) 10 MG tablet Take by mouth. Take 1 tablet daily    [provider]  metFORMIN (GLUCOPHAGE) 1000 MG tablet Take 1,000 mg by mouth 2 (two) times daily.    [provider]  montelukast (SINGULAIR) 10 MG tablet Take 1 tablet by mouth daily before breakfast. 04/26/21   [provider]  nitroGLYCERIN (NITROSTAT) 0.4 MG SL tablet Place 1 tablet (0.4 mg total) under the tongue every 5 (five) minutes as needed for chest pain. 11/20/19   Herminio Commons, MD  Omega-3 Fatty Acids (FISH OIL) 1000 MG CAPS Take 1 capsule by mouth in the  morning and at bedtime.    [provider]  spironolactone (ALDACTONE) 25 MG tablet Take 1 tablet (25 mg total) by mouth daily. 09/28/21   Minus Breeding, MD                                                                                                                                    Allergies Gabapentin and Tomato  Review  of Systems Review of Systems As noted in HPI  Physical Exam Vital Signs  I have reviewed the triage vital signs BP (!) 142/60   Pulse 80   Temp 98.2 F (36.8 C) (Oral)   Resp 20   SpO2 98%   Physical Exam Vitals reviewed.  Constitutional:      General: He is not in acute distress.    Appearance: He is well-developed. He is not diaphoretic.  HENT:     Head: Normocephalic and atraumatic.     Comments: Pale conjunctiva    Nose: Nose normal.  Eyes:     General: No scleral icterus.       Right eye: No discharge.        Left eye: No discharge.     Conjunctiva/sclera: Conjunctivae normal.     Pupils: Pupils are equal, round, and reactive to light.  Cardiovascular:     Rate and Rhythm: Normal rate and regular rhythm.     Heart sounds: No murmur heard.    No friction rub. No gallop.  Pulmonary:     Effort: Pulmonary effort is normal. No respiratory distress.     Breath sounds: Normal breath sounds. No stridor. No rales.  Abdominal:     General: There is no distension.     Palpations: Abdomen is soft.     Tenderness: There is no abdominal tenderness.  Musculoskeletal:        General: No tenderness.     Cervical back: Normal range of motion and neck supple.     Right lower leg: No edema.     Left lower leg: No edema.  Skin:    General: Skin is warm and dry.     Findings: No erythema or rash.  Neurological:     Mental Status: He is alert and oriented to person, place, and time.     ED Results and Treatments Labs (all labs ordered are listed, but only abnormal results are displayed) Labs Reviewed  CBC WITH DIFFERENTIAL/PLATELET -  Abnormal; Notable for the following components:      Result Value   RBC 2.82 (*)    Hemoglobin 5.4 (*)    HCT 20.3 (*)    MCV 72.0 (*)    MCH 19.1 (*)    MCHC 26.6 (*)    RDW 19.8 (*)    Platelets 593 (*)    nRBC 0.3 (*)    Monocytes Absolute 1.1 (*)    All other components within normal limits  BASIC METABOLIC PANEL - Abnormal; Notable for the following components:   Glucose, Bld 169 (*)    Creatinine, Ser 1.33 (*)    GFR, Estimated 59 (*)    All other components within normal limits  I-STAT CHEM 8, ED - Abnormal; Notable for the following components:   Glucose, Bld 136 (*)    Hemoglobin 6.5 (*)    HCT 19.0 (*)    All other components within normal limits  HEPATIC FUNCTION PANEL  URINALYSIS, ROUTINE W REFLEX MICROSCOPIC  FOLATE  RETICULOCYTES  VITAMIN B12  IRON AND TIBC  FERRITIN  POC OCCULT BLOOD, ED  TYPE AND SCREEN  ABO/RH  PREPARE RBC (CROSSMATCH)  EKG  EKG Interpretation  Date/Time:  Tuesday June 13 2022 04:38:25 EDT Ventricular Rate:  82 PR Interval:  37 QRS Duration: 127 QT Interval:  387 QTC Calculation: 452 R Axis:   -42 Text Interpretation: Sinus rhythm Short PR interval Left bundle branch block No acute changes Confirmed by Addison Lank [phone]) on 06/13/2022 7:45:05 AM       Radiology CT HEAD WO CONTRAST (5MM)  Result Date: 06/13/2022 CLINICAL DATA:  66 year old male with history headache described as throbbing in the left ear for the past 6 months. EXAM: CT HEAD WITHOUT CONTRAST TECHNIQUE: Contiguous axial images were obtained from the base of the skull through the vertex without intravenous contrast. RADIATION DOSE REDUCTION: This exam was performed according to the departmental dose-optimization program which includes automated exposure control, adjustment of the mA and/or kV according to patient size and/or use of iterative  reconstruction technique. COMPARISON:  Head CT 05/23/2021. FINDINGS: Brain: Mild cerebral atrophy. Patchy and confluent areas of decreased attenuation are noted throughout the deep and periventricular white matter of the cerebral hemispheres bilaterally, compatible with chronic microvascular ischemic disease. No evidence of acute infarction, hemorrhage, hydrocephalus, extra-axial collection or mass lesion/mass effect. Vascular: No hyperdense vessel or unexpected calcification. Skull: Normal. Negative for fracture or focal lesion. Sinuses/Orbits: No acute finding. Other: None. IMPRESSION: 1. No acute intracranial abnormalities. 2. Mild cerebral atrophy with extensive chronic microvascular ischemic changes in the cerebral white matter, as above, similar to prior studies. Electronically Signed   By: Vinnie Langton M.D.   On: 06/13/2022 06:27    Pertinent labs & imaging results that were available during my care of the patient were reviewed by me and considered in my medical decision making (see MDM for details).  Medications Ordered in ED Medications  0.9 %  sodium chloride infusion (Manually program via Guardrails IV Fluids) ( Intravenous New Bag/Given 06/13/22 0272)                                                                                                                                     Procedures .Critical Care  Performed by: Fatima Blank, MD Authorized by: Fatima Blank, MD   Critical care provider statement:    Critical care time (minutes):  45   Critical care time was exclusive of:  Separately billable procedures and treating other patients   Critical care was necessary to treat or prevent imminent or life-threatening deterioration of the following conditions:  Circulatory failure (Hb<6)   Critical care was time spent personally by me on the following activities:  Development of treatment plan with patient or surrogate, discussions with consultants, evaluation of  patient's response to treatment, examination of patient, obtaining history from patient or surrogate, review of old charts, re-evaluation of patient's condition, pulse oximetry, ordering and review of radiographic studies, ordering and review of laboratory studies and ordering and performing treatments and interventions   (including critical care time)  Medical Decision Making / ED Course    Complexity of Problem:  Co-morbidities/SDOH that complicate the patient evaluation/care: Noted in HPI  Patient's presenting problem/concern, DDX, and MDM listed below: Pulsatile tinnitus Know and being followed by neurology and vascular. They have tried contacting patient for follow up w/o response. Patient made aware of this. CT head was ordered in triage Gen fatigue and DOE With pale conjunctiva, concern for anemia No AC Possible xCHF exacerbation, but feel this is less likely given the lack of volume overload on exam.  If patient does not have anemia then we will pursue this further   Complexity of Data:   Cardiac Monitoring: The patient was maintained on a cardiac monitor.   I personally viewed and interpreted the cardiac monitored which showed an underlying rhythm of normal sinus rhythm with rates in the 80s EKG without acute ischemic changes, dysrhythmias or blocks  Laboratory Tests ordered listed below with my independent interpretation: CBC without leukocytosis.  Positive for anemia with a hemoglobin of 5.4 which is a 5 g drop in 1 year.  Reconfirm with an i-STAT. Hemoccult negative Metabolic panel without significant electrolyte derangements.  Renal function close to his baseline.    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 Hemoccult negative. Patient is symptomatic. Anemia panel ordered. Plan to transfuse 2 units in the emergency department and reevaluate If patient is stable should be appropriate to continue outpatient management with close PCP follow-up.  May benefit from an iron infusion. Patient care turned over to oncoming provider. Patient case and results discussed in detail; please see their note for further ED managment.    Final Clinical Impression(s) / ED Diagnoses Final diagnoses:  Iron deficiency anemia, unspecified iron deficiency anemia type  Pulsatile tinnitus           This chart was dictated using voice recognition software.  Despite best efforts to proofread,  errors can occur which can change the documentation meaning.    Fatima Blank, MD 06/13/22 [phone]

## 2022-06-13 NOTE — ED Provider Triage Note (Signed)
  Emergency Medicine Provider Triage Evaluation Note  MRN:  [id number]  Arrival date & time: 06/13/22    Medically screening exam initiated at 3:01 AM.   CC:   Tinnitus   HPI:  Matthew Cain is a 66 y.o. year-old male presents to the ED with chief complaint of headache x 6 months.  States that it is worsening.  States that he hasn't slept in the past several days because of the headache.  States he takes his BP meds.  History provided by patient. ROS:  -As included in HPI PE:   Vitals:   06/13/22 0258  BP: (!) 191/90  Pulse: 94  Resp: 18  Temp: 98.9 F (37.2 C)  SpO2: 99%    Non-toxic appearing No respiratory distress CN 3-12 intact, speech is clear MDM:   I've ordered labs and imaging in triage to expedite lab/diagnostic workup.  Patient was informed that the remainder of the evaluation will be completed by another provider, this initial triage assessment does not replace that evaluation, and the importance of remaining in the ED until their evaluation is complete.    Roxy Horseman, PA-C 06/13/22 0302

## 2022-06-13 NOTE — ED Provider Notes (Signed)
Blood pressure (!) 153/71, pulse 83, temperature 98 F (36.7 C), temperature source Oral, resp. rate (!) 24, SpO2 99 %.  Assuming care from Dr. Eudelia Bunch.  In short, Matthew Cain is a 66 y.o. male with a chief complaint of Tinnitus .  Refer to the original H&P for additional details.  The current plan of care is to get PRBC and d/c.  11:19 AM  PRBC transfusion complete.  Patient's iron is low along with his ferritin.  B12 and folate normal.  He will continue his iron supplementation and follow-up with his primary care physician to address his anemia.  I have also given contact information for his neurologist to coordinate regarding his presenting complaint today.  Patient is feeling much better after PRBC transfusion and sitting in the bedside chair, inquiring about discharge.  Seems appropriate for discharge at this time.    Maia Plan, MD 06/13/22 1120

## 2022-06-13 NOTE — Discharge Instructions (Addendum)
Please follow with your primary care doctor for repeat blood work.  Additional testing may be necessary to determine why you are needing blood like this.  I would also like for you to follow-up with your neurologist.  I have listed their contact information on the form here.  Please return with any new or suddenly worsening symptoms.

## 2022-06-13 NOTE — ED Notes (Signed)
Charge RN notified of hgb 5.4.

## 2022-06-13 NOTE — ED Triage Notes (Signed)
Pt reported to ED with c/o throbbing in left ear x6 months and describes it as "his heart is beating in his head". Pt states he has been seen for problem several times and has had MRI of brain that showed " weak vessel" in brain. Pt states that symptoms have kept him from sleeping at night.

## 2022-06-14 LAB — TYPE AND SCREEN
ABO/RH(D): B POS
Antibody Screen: NEGATIVE
Unit division: 0
Unit division: 0

## 2022-06-14 LAB — BPAM RBC
Blood Product Expiration Date: 202308302359
Blood Product Expiration Date: 202309012359
ISSUE DATE / TIME: 202308010641
ISSUE DATE / TIME: 202308010641
Unit Type and Rh: 7300
Unit Type and Rh: 7300

## 2022-07-28 IMAGING — MR MR HEAD WO/W CM
12 series · 38 of 48 positions shown · IV contrast (multihance)
Comparison: MRI head 06/08/2021 images only; report by
non-radiologist not reviewed

CLINICAL DATA: Left tinnitus

EXAM:
MRI HEAD WITHOUT AND WITH CONTRAST
TECHNIQUE: Multiplanar, multiecho pulse sequences of the brain and surrounding
structures were obtained without and with intravenous contrast.
CONTRAST:  20mL MULTIHANCE GADOBENATE DIMEGLUMINE 529 MG/ML IV SOLN

[Series 2: T1 · sagittal · 5.0mm · 0.45mm/px · 2 of 28 slices shown (1 of 4)]
[im 1/28]
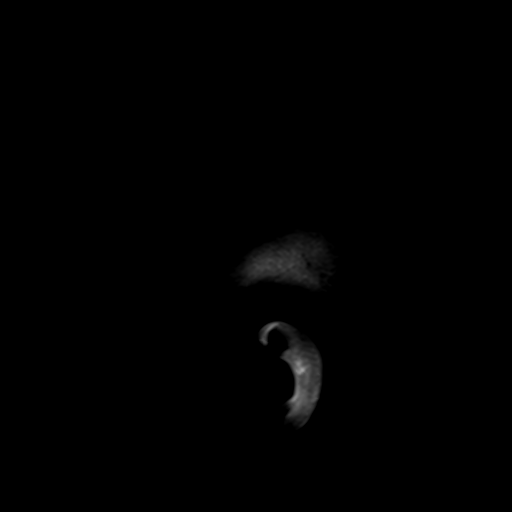
[im 28/28]
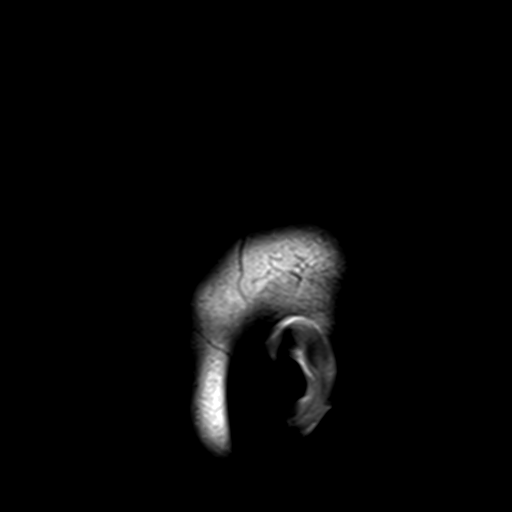

[Series 3: ep2d_diff_3 · axial · 3.0mm · 1.80mm/px · z∈[-101,+52]mm · 8 of 100 slices shown]
[im 1/100]
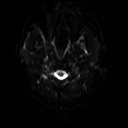
[im 12/100]
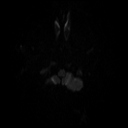
[im 34/100]
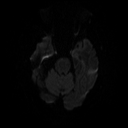
[im 45/100]
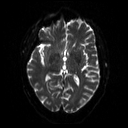
[im 56/100]
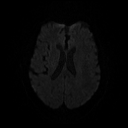
[im 67/100]
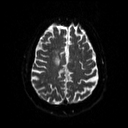
[im 89/100]
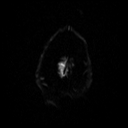
[im 100/100]
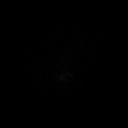

[Series 4: ep2d_diff_3_adc · axial · 3.0mm · 1.80mm/px · z∈[-101,+52]mm · 5 of 51 slices shown]
[im 1/51]
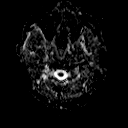
[im 13/51]
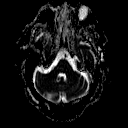
[im 26/51]
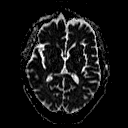
[im 38/51]
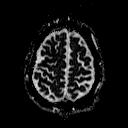
[im 51/51]
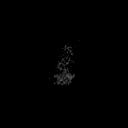

[Series 5: t2_tse_tra_512 · axial · 5.0mm · 0.60mm/px · z∈[-101,+55]mm · 3 of 26 slices shown]
[im 1/26]
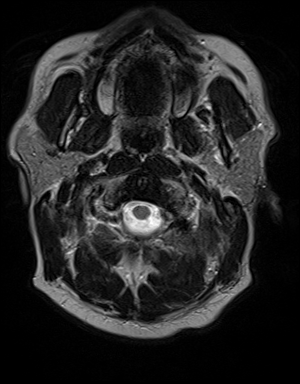
[im 13/26]
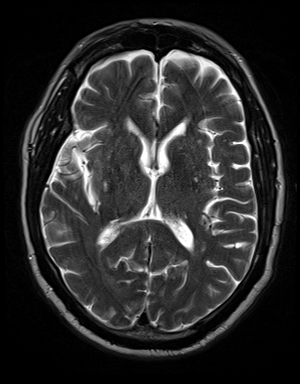
[im 26/26]
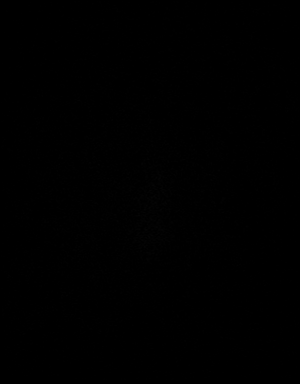

[Series 6: FLAIR · axial · 5.0mm · 0.43mm/px · z∈[-96,+60]mm · 3 of 26 slices shown]
[im 1/26]
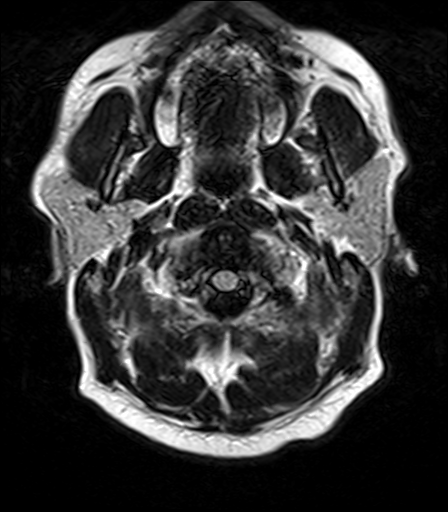
[im 13/26]
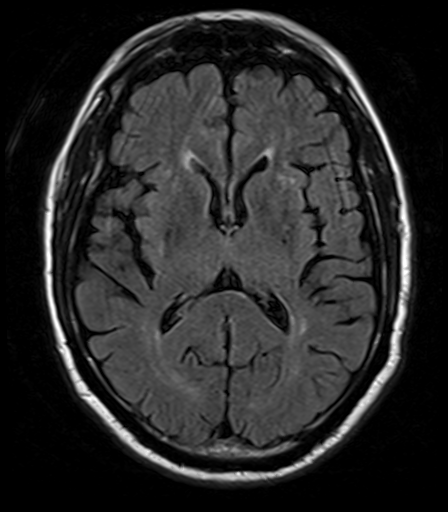
[im 26/26]
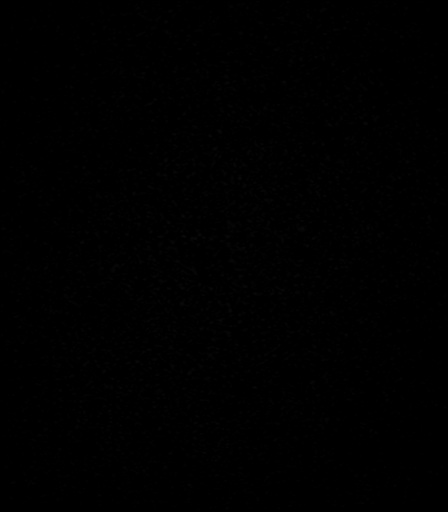

[Series 8: swi_images · axial · 2.0mm · 0.90mm/px · z∈[-97,+61]mm · 8 of 80 slices shown]
[im 1/80]
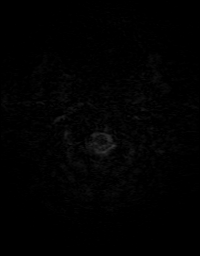
[im 12/80]
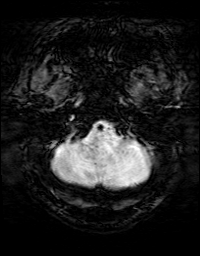
[im 23/80]
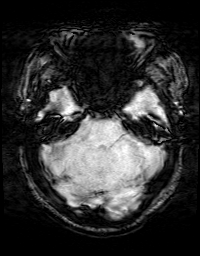
[im 34/80]
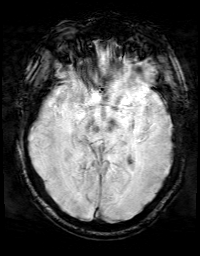
[im 46/80]
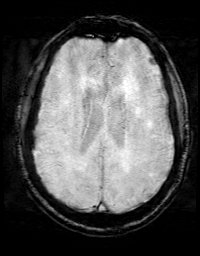
[im 57/80]
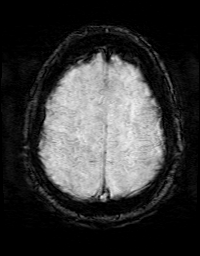
[im 68/80]
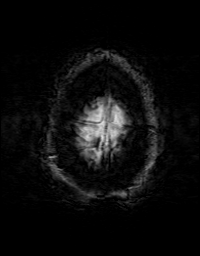
[im 80/80]
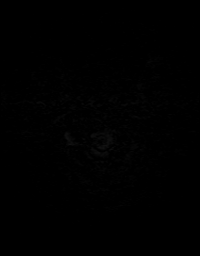

[Series 9: T1 · coronal · 3.0mm · 0.35mm/px · 1 of 11 slices shown (2 of 4)]
[im 1/11]
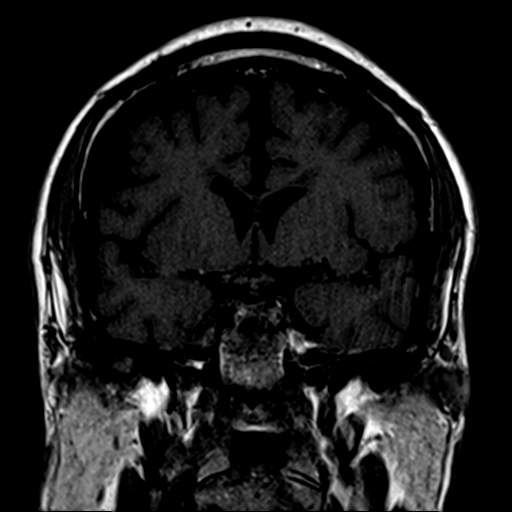

[Series 10: T1 · axial · 3.0mm · 0.35mm/px · 1 of 11 slices shown (3 of 4)]
[im 1/11]
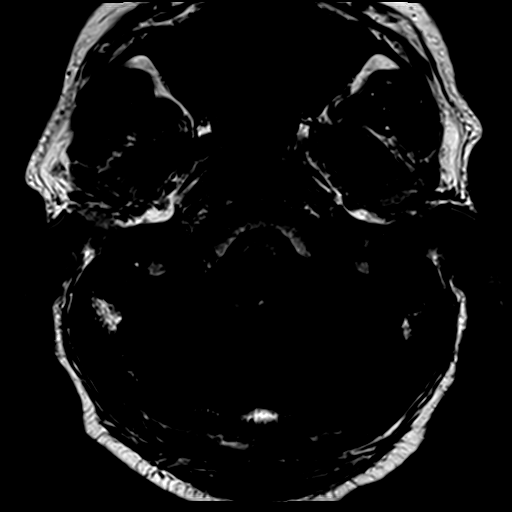

[Series 11: bSSFP · axial · 0.7mm · 0.28mm/px · z∈[-76,-46]mm · 4 of 44 slices shown]
[im 1/44]
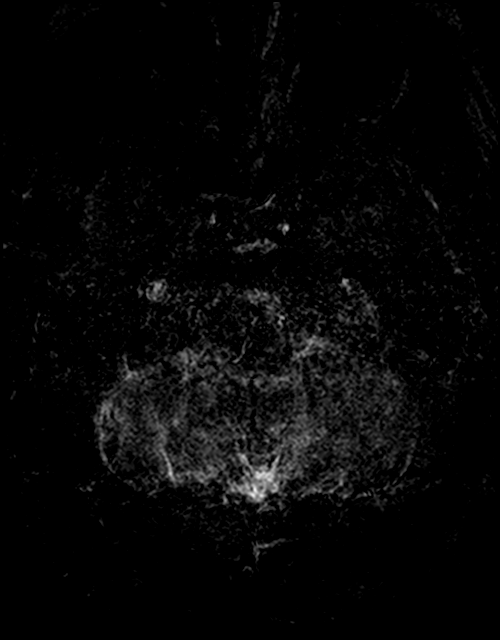
[im 15/44]
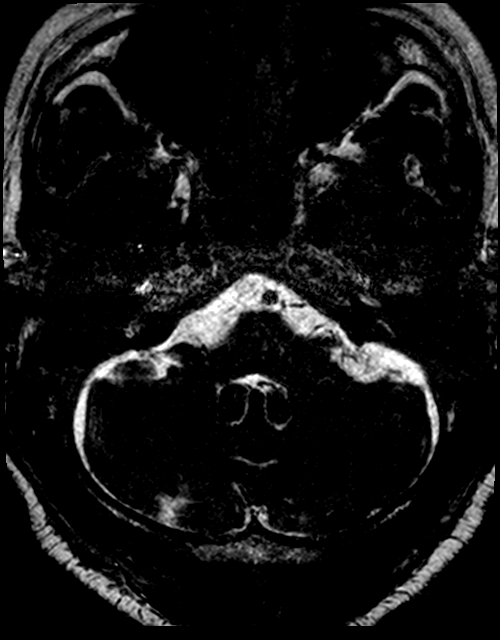
[im 29/44]
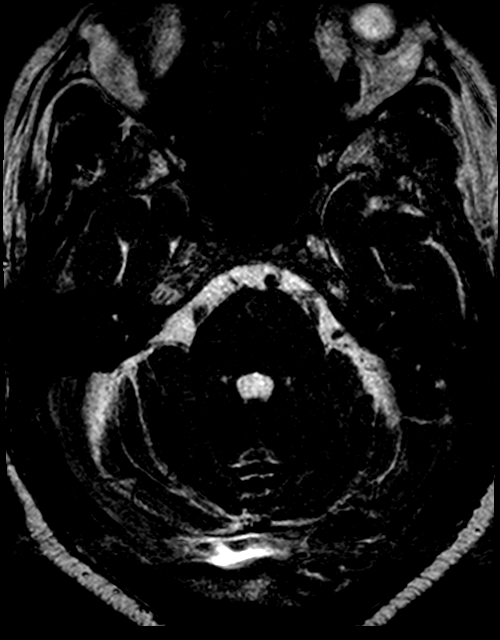
[im 44/44]
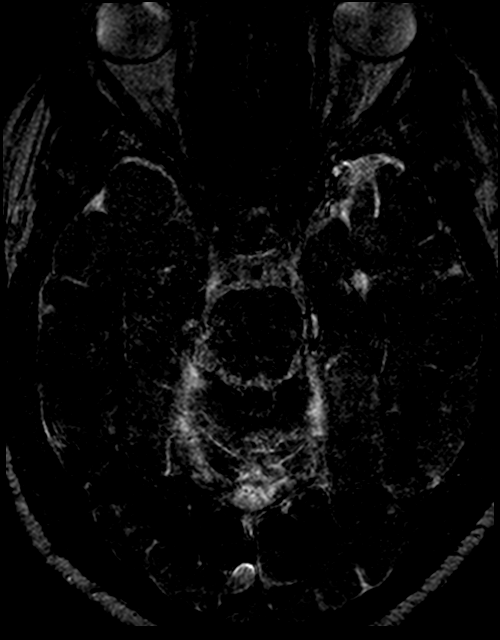

[Series 12: T1 · coronal · 3.0mm · 0.35mm/px · 1 of 11 slices shown (4 of 4)]
[im 1/11]
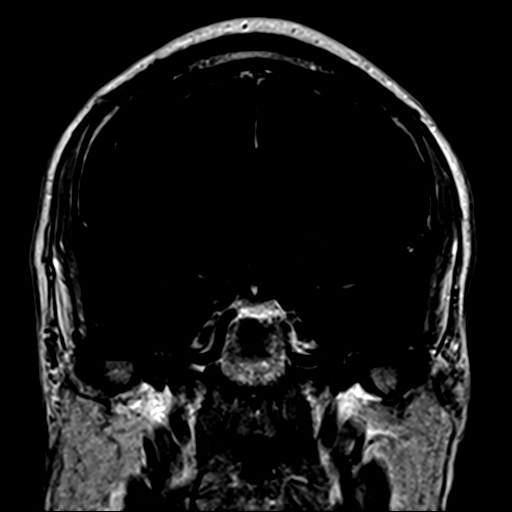

[Series 13: T1 post-contrast · axial · 3.0mm · 0.35mm/px · 1 of 11 slices shown]
[im 1/11]
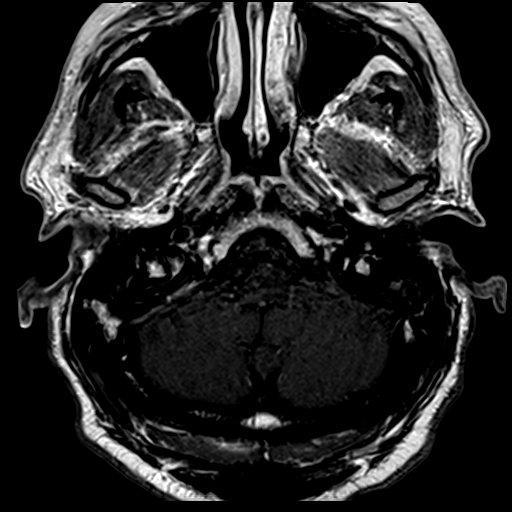

[Series 14: axial (person_name) · axial · 2.0mm · 0.49mm/px · 1 of 88 slices shown]
[im 1/88]
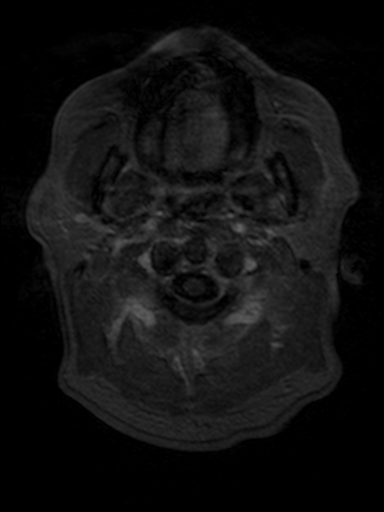

[38 of 48 positions shown; findings below may reference images not displayed]

FINDINGS: Motion artifact is present.

Brain: There is no cerebellopontine angle mass. Inner ear structures
demonstrate an unremarkable MR appearance. There is no abnormal
enhancement within the internal auditory canals.

No acute infarction or intracranial hemorrhage. Prominence of the
ventricles and sulci reflects stable parenchymal volume loss. Patchy
and confluent areas of T2 hyperintensity in the supratentorial white
matter are nonspecific but probably reflect stable mild to moderate
chronic microvascular ischemic changes. No definite cortical
involvement. There is no mass effect or edema. There is no
extra-axial fluid collection.

Vascular: Major vessel flow voids at the skull base are preserved.

Skull and upper cervical spine: Normal marrow signal is preserved.

Sinuses/Orbits: Paranasal sinuses are clear. The orbits are
unremarkable.

Other: The sella is unremarkable. Minimal mastoid fluid
opacification.
IMPRESSION: No evidence of recent infarction, hemorrhage, or mass. No abnormal
enhancement.

Stable chronic microvascular ischemic changes.

## 2022-08-03 ENCOUNTER — Ambulatory Visit: Payer: Medicare Other | Admitting: Neurology

## 2022-08-15 ENCOUNTER — Ambulatory Visit (INDEPENDENT_AMBULATORY_CARE_PROVIDER_SITE_OTHER): Payer: Medicare Other | Admitting: Neurology

## 2022-08-15 VITALS — BP 166/71 | HR 81 | Ht 70.0 in | Wt 220.0 lb

## 2022-08-15 DIAGNOSIS — I5032 Chronic diastolic (congestive) heart failure: Secondary | ICD-10-CM

## 2022-08-15 DIAGNOSIS — H9312 Tinnitus, left ear: Secondary | ICD-10-CM | POA: Diagnosis not present

## 2022-08-15 DIAGNOSIS — E1122 Type 2 diabetes mellitus with diabetic chronic kidney disease: Secondary | ICD-10-CM | POA: Diagnosis not present

## 2022-08-15 DIAGNOSIS — Z8673 Personal history of transient ischemic attack (TIA), and cerebral infarction without residual deficits: Secondary | ICD-10-CM

## 2022-08-15 DIAGNOSIS — N182 Chronic kidney disease, stage 2 (mild): Secondary | ICD-10-CM

## 2022-08-15 DIAGNOSIS — I25118 Atherosclerotic heart disease of native coronary artery with other forms of angina pectoris: Secondary | ICD-10-CM

## 2022-08-15 NOTE — Patient Instructions (Signed)
I had a long discussion with the patient with regards to his pulsatile tinnitus and discussed cerebral catheter angiogram findings of jugular bulb diverticulum and I recommended endovascular treatment for removal of this which should help his tinnitus.  D/W  Dr. Tennis Must. Janean Sark who informed me that her office has tried to contact patient multiple times but every time the call his listed cell phone goes into voicemail which is full and they are unable to leave a message..  Continue aspirin and Plavix for stroke prevention given history of history of significant cardiac disease and maintain aggressive risk factor modification with strict control of hypertension with blood pressure goal below 130/90, lipids with LDL cholesterol goal below 70 mg percent and diabetes with hemoglobin A1c goal below 6.5%.  He was advised to eat a healthy diet and to exercise regularly and lose weight.Return for f/u in1 year or call earlier if needed.

## 2022-08-15 NOTE — Progress Notes (Signed)
[facility] [address] [place]. [place] [address] [phone]       OFFICE FOLLOW UP VISIT NOTE  Mr. Mikhail Hallenbeck Date of Birth:  1956-05-25 Medical Record Number:  [id number]   Referring MD: Nanda Quinton  Reason for Referral: Tinnitus  HPI: Initial visit 05/26/2021 Mr. Tennyson is a 66 year old African-American male with past medical history of diabetes, hypertension, hyperlipidemia, congestive heart failure, anxiety, renal insufficiency, coronary artery disease right hemispheric infarct in 2015.  He is seen today for evaluation for new complaint of right ear pulsatile tinnitus for a month.  History is obtained from the patient and review of electronic medical records and personally reviewed available imaging films in PACS.  Patient states he developed sudden onset of pulsatile tinnitus in the right ear about a month ago.  He is unable to attribute to any obvious triggers.  He states he hears his heartbeat in his ear.  It is more prominent when he is inactive sitting or lying down.  He has to sleep lying on his right side.  Putting pressure on the right side of the neck seems to make this feeling temporary suppressed.  He denies any hearing loss, vertigo, gait or balance problems.  He denies any recurrent ear infections or recent trauma or head injury.  He underwent CT angiogram of the brain and neck on 05/23/2021 which I personally reviewed shows multifocal atherosclerotic changes with 40% right proximal ICA, 50% left proximal ICA, moderate left cavernous ICA stenosis as well as severe distal right M1, right A2 stenosis and moderate bilateral vertebral artery stenosis and V4 segment and right posterior cerebral artery in the P2 segment.  CT scan of the head on 11/16/2019 showed no acute abnormalities.  Patient has prior history of right hemispheric infarct in 2015 with left-sided weakness and numbness.  Was felt to be probably due to small vessel disease though I do not have those records  to review today.  He was placed on aspirin and he was participating in the ACCELERATE stroke prevention study.  He was lost to follow-up.  States he had no recurrent stroke or TIA symptoms since then.  He states he had his third shot of [employer] vaccine on 09/10/2020 had no major side effects from that.  Getting ready to get his fourth COVID booster shot soon.  States his blood pressure is under good control though today it is elevated at 148/75.  Remains on Lipitor which is tolerating well without muscle aches and pains.  His diabetes control remains poor and last hemoglobin A1c was 9.3 a month ago.  He is tolerating aspirin without side effects Update 09/26/2021 : He returns for follow-up after last visit with me 4 months ago.  Continues to have pulsatile tinnitus which can be suppressed with pressure on the lower part of the right neck.  He says occasionally he gets tinnitus in the left side as well.  He underwent diagnostic cerebral catheter angiogram on 06/03/2021 by Dr. 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  There was diffuse luminal irregularity of the right anterior circulation consistent with intracranial atherosclerotic disease with focal areas of moderate stenosis in the distal right M1 and right A3 ACA segment.  1.5 x 1.2 cm venous outpouching was noted in the jugular bulb on the right.  There was moderate stenosis at the origin of the right vertebral artery and mild stenosis at V1 V2 junction.  There were mild luminal irregularities of left carotid siphon,  bilateral ACA and MCA severe stenosis or the origin of the left A1 ACA segment and moderate stenosis of the A2 A3 junction.  There is moderate stenosis of distal P2 segment on the left as well.  Patient underwent MRI scan of the brain on 06/10/2021 which showed small punctate infarcts involving bilateral cerebellum, right temporal, right frontal and parietal regions these are likely small incidental silent  infarcts which was procedure to complication of the angiogram a week before.  Patient had no corresponding clinical symptoms of stroke at that time.  Lab work on 07/06/2021 showed elevated A1c of 9.8 and LDL cholesterol on 08/05/2021 was quite low at 22 and triglycerides were elevated at 212.  Patient states that he is working with primary care physician to getting his diabetes under better control.  His blood pressure is well controlled.  Tolerating Lipitor well without muscle aches and pains.  He is change his eating habits and is trying to lose weight.  He remains on aspirin and Plavix following his cardiac stents and started in both well without bruising or bleeding.  He has no new complaints today. Update 01/31/2022 : He returns for follow-up after last visit 4 months ago.  He continues to have pulsatile tinnitus in his right ear which is constant.  He does reduce somewhat with he put some pressure on the right side of the neck on [place] his neck to the right shoulder.  He did have COVID infection in January but fortunately has recovered very well.  He had hemoglobin A1c checked on 12/13/2021 and was quite elevated at 10.4.  He is working with primary care physician to bring his sugar down.  His blood pressure is doing well and today it is 138/83.  He is tolerating Lipitor well without side effects.  Patient has not yet seen Dr. 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  He continues to have tinnitus and today he states he is feeling the left ear.  Resolved with putting some light pressure side of his neck or tilting his neck to that side.  I had suggested he see Dr. Norma Fredrickson for neuro interventional treatment for his jugular vein diverticulum but patient stated never got a call.  I personally called Dr. Norma Fredrickson today who informed me that her office had called the patient stated numbers  multiple times but did not pick up the phone and there was no answering machine.  Today again the patient gave me his phone number which I gave  Dr. Norma Fredrickson and when her scheduler called about the same response.  Patient remains on aspirin which is tolerating well without side effects.  He states his blood pressure is under good control at home though today it is 166/71 in office.  He is tolerating Lipitor 80 mg well without side effects.  His last hemoglobin A1c was 7.3 few weeks ago which has come down significantly.  He has no new complaints. ROS:   14 system review of systems is positive for tinnitus, ringing sound in the ears all other systems negative  PMH:  Past Medical History:  Diagnosis Date   Amputation finger    left ring finger   Anxiety    CAD (coronary artery disease)    LHC 10/12:  mild plaque in the LAD but no obstructive CAD   Chronic diastolic heart failure (HCC)    EF 55-60%, grade  1 diastolic dysfunction; no WMAs, echo, 11/2011; s/p pulmo. edema c/b VDRF 11/12; Echo 09/08/11: severe LVH, no SAM, no LVOT gradient, EF 55%, mild BAE, mild reduced RVSF.   CKD (chronic kidney disease)    Depression    Diabetes mellitus    A1C 7.3 08/2011   GERD (gastroesophageal reflux disease)    History of kidney stones    History of right ACA stroke    10/12  --  MRI 09/13/11:  Acute right ACA infarct involving corpus callosum.  MRA 09/13/11: severe intracranial ASD, right ACA occluded, left PCA and right MCA with severe disease.  Dopplers were neg for significant ICA stenosis   HLD (hyperlipidemia)    Hypertension    Renal insufficiency    Spermatocele    Thoracic aortic aneurysm (HCC)    CT in 10/12: 4.4 cm ascending thoracic aortic aneurysm    Social History:  Social History   Socioeconomic History   Marital status: Divorced    Spouse name: Not on file   Number of children: Not on file   Years of education: Not on file   Highest education level: Not on file  Occupational  History   Not on file  Tobacco Use   Smoking status: Former    Packs/day: 1.00    Years: 30.00    Total pack years: 30.00    Types: Cigarettes    Quit date: 11/13/1994    Years since quitting: 27.7   Smokeless tobacco: Never  Vaping Use   Vaping Use: Never used  Substance and Sexual Activity   Alcohol use: No   Drug use: No   Sexual activity: Not Currently  Other Topics Concern   Not on file  Social History Narrative   Lives alone   Right Handed   Drinks 2-3 cups caffeine daily   Social Determinants of Health   Financial Resource Strain: Not on file  Food Insecurity: Not on file  Transportation Needs: Not on file  Physical Activity: Not on file  Stress: Not on file  Social Connections: Not on file  Intimate Partner Violence: Not on file    Medications:   Current Outpatient Medications on File Prior to Visit  Medication Sig Dispense Refill   amLODipine (NORVASC) 10 MG tablet Take by mouth.     amLODipine (NORVASC) 10 MG tablet Take 1 tablet (10 mg total) by mouth every morning. 90 tablet 3   ascorbic acid (VITAMIN C) 1000 MG tablet Take by mouth.     aspirin 81 MG EC tablet Take by mouth.     atorvastatin (LIPITOR) 80 MG tablet Take 1 tablet by mouth at bedtime.     BD PEN NEEDLE NANO 2ND GEN 32G X 4 MM MISC USE WITH LANTUS SOLOSTAR TWICE DAILY     BINAXNOW COVID-19 AG HOME TEST KIT Use as Directed on the Package     busPIRone (BUSPAR) 5 MG tablet Take 1 tablet by mouth 3 (three) times daily as needed.     carvedilol (COREG) 25 MG tablet Take 1 tablet (25 mg total) by mouth 2 (two) times daily. 180 tablet 3   DULoxetine (CYMBALTA) 60 MG capsule Take 1 capsule by mouth daily.     famotidine (PEPCID) 40 MG tablet Take 40 mg by mouth daily.     ferrous sulfate 325 (65 FE) MG tablet Take 325 mg by mouth at bedtime.      ibuprofen (ADVIL,MOTRIN) 800 MG tablet Take 400-800 mg by mouth every 8 (eight) hours  as needed for pain.      insulin detemir (LEVEMIR FLEXTOUCH) 100  UNIT/ML FlexPen Inject into the skin. 60 units BID     isosorbide mononitrate (IMDUR) 30 MG 24 hr tablet Take 1 tablet (30 mg total) by mouth daily. 90 tablet 3   lisinopril (ZESTRIL) 40 MG tablet Take 1 tablet (40 mg total) by mouth daily. 90 tablet 3   loratadine (CLARITIN) 10 MG tablet Take by mouth. Take 1 tablet daily     loratadine (CLARITIN) 10 MG tablet Take 1 tablet by mouth daily before breakfast.     metFORMIN (GLUCOPHAGE) 1000 MG tablet Take 1,000 mg by mouth 2 (two) times daily.     montelukast (SINGULAIR) 10 MG tablet Take 1 tablet by mouth daily before breakfast.     nitroGLYCERIN (NITROSTAT) 0.4 MG SL tablet Place 1 tablet (0.4 mg total) under the tongue every 5 (five) minutes as needed for chest pain. 25 tablet 3   Omega-3 Fatty Acids (FISH OIL) 1000 MG CAPS Take 1 capsule by mouth in the morning and at bedtime.     pantoprazole (PROTONIX) 40 MG tablet Take 1 tablet by mouth daily.     spironolactone (ALDACTONE) 25 MG tablet Take 1 tablet (25 mg total) by mouth daily. 90 tablet 3   clopidogrel (PLAVIX) 75 MG tablet Take 75 mg by mouth daily.     glipiZIDE (GLUCOTROL) 5 MG tablet Take 10 mg by mouth 2 (two) times daily.     No current facility-administered medications on file prior to visit.    Allergies:   Allergies  Allergen Reactions   Gabapentin    Tomato Rash    Physical Exam General: Obese middle-aged African-American male d, seated, in no evident distress Head: head normocephalic and atraumatic.   Neck: supple with no carotid or supraclavicular bruits.  No jugular bruit. Cardiovascular: regular rate and rhythm, no murmurs Musculoskeletal: no deformity Skin:  no rash/petichiae Vascular:  Normal pulses all extremities  Neurologic Exam Mental Status: Awake and fully alert. Oriented to place and time. Recent and remote memory intact. Attention span, concentration and fund of knowledge appropriate. Mood and affect appropriate.  Cranial Nerves: Fundoscopic exam not  done. Pupils equal, briskly reactive to light. Extraocular movements full without nystagmus. Visual fields full to confrontation. Hearing intact. Facial sensation intact. Face, tongue, palate moves normally and symmetrically.  Motor: Normal bulk and tone. Normal strength in all tested extremity muscles. Sensory.: intact to touch , pinprick , position and vibratory sensation.  Coordination: Rapid alternating movements normal in all extremities. Finger-to-nose and heel-to-shin performed accurately bilaterally. Gait and Station: Arises from chair without difficulty. Stance is normal. Gait demonstrates normal stride length and balance . Able to heel, toe and tandem walk without difficulty.  Reflexes: 1+ and symmetric. Toes downgoing.      ASSESSMENT: 66 year old African-American male with sudden onset of pulsatile tinnitus in the right ear for 5 months which temporary suppresses with pressure on the right side of his neck.  Unremarkable neurological exam.  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  Remote history of right hemispheric small infarct likely from small vessel disease in 2015 with full recovery.  Vascular risk factors of diabetes, hypertension, hyperlipidemia, coronary artery disease, obesity and at risk for sleep apnea     PLAN: I had a long discussion with the patient with regards to his pulsatile tinnitus and discussed cerebral catheter angiogram findings of jugular bulb diverticulum  and I recommended endovascular treatment for removal of this which should help his tinnitus.  D/W  Dr. Tennis Must. Janean Sark who informed me that her office has tried to contact patient multiple times but every time the call his listed cell phone goes into voicemail which is full and they are unable to leave a message..  Continue aspirin and Plavix for stroke prevention given history of history of significant cardiac disease and maintain  aggressive risk factor modification with strict control of hypertension with blood pressure goal below 130/90, lipids with LDL cholesterol goal below 70 mg percent and diabetes with hemoglobin A1c goal below 6.5%.  He was advised to eat a healthy diet and to exercise regularly and lose weight.Return for f/u in1 year or call earlier if needed. Greater than 50% time during this 35-minute follow-up visit was spent on counseling and coordination of care about his pulsatile tinnitus and discussion about angiogram findings and treatment planning and answering questions Antony Contras, MD Note: This document was prepared with digital dictation and possible smart phrase technology. Any transcriptional errors that result from this process are unintentional.

## 2022-08-17 ENCOUNTER — Other Ambulatory Visit (HOSPITAL_COMMUNITY): Payer: Self-pay | Admitting: Neuroradiology

## 2022-08-17 ENCOUNTER — Encounter (HOSPITAL_COMMUNITY): Payer: Self-pay

## 2022-08-17 DIAGNOSIS — H93A9 Pulsatile tinnitus, unspecified ear: Secondary | ICD-10-CM

## 2022-08-31 ENCOUNTER — Ambulatory Visit (HOSPITAL_COMMUNITY)
Admission: RE | Admit: 2022-08-31 | Discharge: 2022-08-31 | Disposition: A | Discharge status: Home / Self Care | Payer: Medicare Other | Source: Ambulatory Visit | Attending: Neuroradiology | Admitting: Neuroradiology

## 2022-08-31 ENCOUNTER — Other Ambulatory Visit (HOSPITAL_COMMUNITY): Payer: Self-pay | Admitting: Neuroradiology

## 2022-08-31 DIAGNOSIS — H93A9 Pulsatile tinnitus, unspecified ear: Secondary | ICD-10-CM

## 2022-08-31 NOTE — Consult Note (Signed)
Chief Complaint: Patient was seen in consultation today for pulsatile tinnitus, left side.  Referring Physician(s): Antony Contras, MD  Supervising Physician: Pedro Earls  Patient Status: [facility] - Out-pt  History of Present Illness: Matthew Cain is a 66 y.o. male with a past medical history of chronic diastolic heart failure, diabetes mellitus, CVA, hypertension and renal insufficiency. He presents today for evaluation of pulsatile tinnitus of his left ear. We performed a cerebral angiogram for this complaint in July 2022. Cerebral angio was positive for a diverticulum on the right side with no significant abnormalities seen on the left. However, angiogram images were degrade by motion as patient was unable to lie still on the angiography table. We made multiple attempts to contact the patient for a follow-up visit but were unable to reach. He returns today due to persistent pulsatile tinnitus in his left ear. He says the sound coincides with his heartbeat and can be suppressed by manual pressure on the left lateral aspect of the upper neck. It is constant and feels louder on a quiet environment and, therefore, disturbs his sleep.  Past Medical History:  Diagnosis Date   Amputation finger    left ring finger   Anxiety    CAD (coronary artery disease)    LHC 10/12:  mild plaque in the LAD but no obstructive CAD   Chronic diastolic heart failure (HCC)    EF 35-00%, grade 1 diastolic dysfunction; no WMAs, echo, 11/2011; s/p pulmo. edema c/b VDRF 11/12; Echo 09/08/11: severe LVH, no SAM, no LVOT gradient, EF 55%, mild BAE, mild reduced RVSF.   CKD (chronic kidney disease)    Depression    Diabetes mellitus    A1C 7.3 08/2011   GERD (gastroesophageal reflux disease)    History of kidney stones    History of right ACA stroke    10/12  --  MRI 09/13/11:  Acute right ACA infarct involving corpus callosum.  MRA 09/13/11: severe intracranial ASD, right ACA occluded, left PCA and  right MCA with severe disease.  Dopplers were neg for significant ICA stenosis   HLD (hyperlipidemia)    Hypertension    Renal insufficiency    Spermatocele    Thoracic aortic aneurysm (HCC)    CT in 10/12: 4.4 cm ascending thoracic aortic aneurysm    Past Surgical History:  Procedure Laterality Date    vasectomy     bilateral   CARDIAC CATHETERIZATION  09/09/11   IR ANGIO EXTERNAL CAROTID SEL EXT CAROTID UNI R MOD SED  06/03/2021   IR ANGIO INTRA EXTRACRAN SEL COM CAROTID INNOMINATE UNI L MOD SED  06/03/2021   IR ANGIO INTRA EXTRACRAN SEL INTERNAL CAROTID UNI R MOD SED  06/03/2021   IR ANGIO VERTEBRAL SEL VERTEBRAL BILAT MOD SED  06/03/2021   IR US GUIDE VASC ACCESS RIGHT  06/03/2021   ROTATOR CUFF REPAIR  07/2015   LEFT   SPERMATOCELECTOMY  1990's   RT side   SPERMATOCELECTOMY Left 07/20/2015   Procedure: LEFT HYDROCELECTOMY;  Surgeon: Irine Seal, MD;  Location: [facility];  Service: Urology;  Laterality: Left;   TRANSURETHRAL INCISION OF PROSTATE N/A 07/20/2015   Procedure: TRANSURETHRAL INCISION OF THE PROSTATE (TUIP);  Surgeon: Irine Seal, MD;  Location: [facility];  Service: Urology;  Laterality: N/A;    Allergies: Gabapentin and Tomato  Medications: Prior to Admission medications   Medication Sig Start Date End Date Taking? Authorizing Provider  amLODipine (NORVASC) 10 MG tablet Take by mouth. 03/07/21  [provider]  amLODipine (NORVASC) 10 MG tablet Take 1 tablet (10 mg total) by mouth every morning. 09/28/21   Minus Breeding, MD  ascorbic acid (VITAMIN C) 1000 MG tablet Take by mouth.    [provider]  aspirin 81 MG EC tablet Take by mouth.    [provider]  atorvastatin (LIPITOR) 80 MG tablet Take 1 tablet by mouth at bedtime. 05/23/21   [provider]  BD PEN NEEDLE NANO 2ND GEN 32G X 4 MM MISC USE WITH LANTUS SOLOSTAR TWICE DAILY 07/01/21   [provider]  Bartolo Darter COVID-19 AG HOME TEST KIT Use as Directed on the Package 06/16/21    [provider]  busPIRone (BUSPAR) 5 MG tablet Take 1 tablet by mouth 3 (three) times daily as needed. 08/14/22   [provider]  carvedilol (COREG) 25 MG tablet Take 1 tablet (25 mg total) by mouth 2 (two) times daily. 09/28/21   Minus Breeding, MD  clopidogrel (PLAVIX) 75 MG tablet Take 75 mg by mouth daily. 03/05/22   [provider]  DULoxetine (CYMBALTA) 60 MG capsule Take 1 capsule by mouth daily. 10/04/18   [provider]  famotidine (PEPCID) 40 MG tablet Take 40 mg by mouth daily. 11/12/19   [provider]  ferrous sulfate 325 (65 FE) MG tablet Take 325 mg by mouth at bedtime.     [provider]  glipiZIDE (GLUCOTROL) 5 MG tablet Take 10 mg by mouth 2 (two) times daily. 07/29/22   [provider]  ibuprofen (ADVIL,MOTRIN) 800 MG tablet Take 400-800 mg by mouth every 8 (eight) hours as needed for pain.     [provider]  insulin detemir (LEVEMIR FLEXTOUCH) 100 UNIT/ML FlexPen Inject into the skin. 60 units BID 06/24/21   [provider]  isosorbide mononitrate (IMDUR) 30 MG 24 hr tablet Take 1 tablet (30 mg total) by mouth daily. 09/28/21   Minus Breeding, MD  lisinopril (ZESTRIL) 40 MG tablet Take 1 tablet (40 mg total) by mouth daily. 09/28/21   Minus Breeding, MD  loratadine (CLARITIN) 10 MG tablet Take by mouth. Take 1 tablet daily    [provider]  loratadine (CLARITIN) 10 MG tablet Take 1 tablet by mouth daily before breakfast. 09/09/21   [provider]  metFORMIN (GLUCOPHAGE) 1000 MG tablet Take 1,000 mg by mouth 2 (two) times daily.    [provider]  montelukast (SINGULAIR) 10 MG tablet Take 1 tablet by mouth daily before breakfast. 04/26/21   [provider]  nitroGLYCERIN (NITROSTAT) 0.4 MG SL tablet Place 1 tablet (0.4 mg total) under the tongue every 5 (five) minutes as needed for chest pain. 11/20/19   Herminio Commons, MD  Omega-3 Fatty Acids (FISH  OIL) 1000 MG CAPS Take 1 capsule by mouth in the morning and at bedtime.    [provider]  pantoprazole (PROTONIX) 40 MG tablet Take 1 tablet by mouth daily. 01/02/22   [provider]  spironolactone (ALDACTONE) 25 MG tablet Take 1 tablet (25 mg total) by mouth daily. 09/28/21   Minus Breeding, MD     Family History  Problem Relation Age of Onset   Heart attack Mother    Cerebral aneurysm Father    Cerebral aneurysm Other    Diabetes Other    Hypertension Other     Social History   Socioeconomic History   Marital status: Divorced    Spouse name: Not on file   Number of  children: Not on file   Years of education: Not on file   Highest education level: Not on file  Occupational History   Not on file  Tobacco Use   Smoking status: Former    Packs/day: 1.00    Years: 30.00    Total pack years: 30.00    Types: Cigarettes    Quit date: 11/13/1994    Years since quitting: 27.8   Smokeless tobacco: Never  Vaping Use   Vaping Use: Never used  Substance and Sexual Activity   Alcohol use: No   Drug use: No   Sexual activity: Not Currently  Other Topics Concern   Not on file  Social History Narrative   Lives alone   Right Handed   Drinks 2-3 cups caffeine daily   Social Determinants of Health   Financial Resource Strain: Not on file  Food Insecurity: Not on file  Transportation Needs: Not on file  Physical Activity: Not on file  Stress: Not on file  Social Connections: Not on file     Review of Systems: A 12 point ROS discussed and pertinent positives are indicated in the HPI above.  All other systems are negative.  Review of Systems  Vital Signs: There were no vitals taken for this visit.  Physical Exam Constitutional:      Appearance: Normal appearance.  HENT:     Head: Normocephalic and atraumatic.  Neurological:     General: No focal deficit present.     Mental Status: He is alert and oriented to person, place, and time. Mental status  is at baseline.     Cranial Nerves: Cranial nerves 2-12 are intact.     Motor: Motor function is intact.     Coordination: Coordination is intact.     Gait: Gait is intact.          Imaging: No results found.  Labs:  CBC: Recent Labs    11/28/21 1555 06/13/22 0307 06/13/22 0605  WBC 6.2 7.9  --   HGB 11.0* 5.4* 6.5*  HCT 35.0* 20.3* 19.0*  PLT 406* 593*  --     COAGS: No results for input(s): "INR", "APTT" in the last 8760 hours.  BMP: Recent Labs    11/28/21 1555 06/13/22 0307 06/13/22 0605  NA 132* 137 139  K 3.9 3.8 3.8  CL 95* 105 103  CO2 25 23  --   GLUCOSE 404* 169* 136*  BUN _0 CALCIUM 9.0 9.6  --   CREATININE 1.57* 1.33* 1.20  GFRNONAA 48* 59*  --     LIVER FUNCTION TESTS: Recent Labs    06/13/22 0307  BILITOT <0.1*  AST 19  ALT 17  ALKPHOS 53  PROT 6.8  ALBUMIN 4.0    TUMOR MARKERS: No results for input(s): "AFPTM", "CEA", "CA199", "CHROMGRNA" in the last 8760 hours.  Assessment and Plan:  66 year old male with left sided pulsatile tinnitus. The characteristics of the tinnitus are highly suspicious for vascular cause. Although he had a diagnostic cerebral angiogram, it was of low quality due to patient inability to lie still on the angiography table. I proposed repeating the cerebral angiogram under local anesthesia to increase chance of patient following commands appropriately and not fall asleep. Additionally, I will perform venograms in addition to arteriograms if venous sinuses are not adequately opacified. He has provided a new phone number to facilitate contacting him for scheduling. Additionally, I provided our schedulers phone number in case they are unable to reach  him.  Thank you for this interesting consult.  I greatly enjoyed meeting Matthew Cain and look forward to participating in their care.  A copy of this report was sent to the requesting provider on this date.  Electronically Signed: Pedro Earls,  MD 08/31/2022, 1:51 PM   I spent a total of   25 Minutes in face to face in clinical consultation, greater than 50% of which was counseling/coordinating care for pulsatile tinnitus of left ear.

## 2022-09-06 ENCOUNTER — Other Ambulatory Visit: Payer: Self-pay | Admitting: Radiology

## 2022-09-06 DIAGNOSIS — H9312 Tinnitus, left ear: Secondary | ICD-10-CM

## 2022-09-07 ENCOUNTER — Other Ambulatory Visit (HOSPITAL_COMMUNITY): Payer: Self-pay | Admitting: Neuroradiology

## 2022-09-07 ENCOUNTER — Other Ambulatory Visit: Payer: Self-pay

## 2022-09-07 ENCOUNTER — Ambulatory Visit (HOSPITAL_COMMUNITY)
Admission: RE | Admit: 2022-09-07 | Discharge: 2022-09-07 | Disposition: A | Discharge status: Home / Self Care | Payer: Medicare Other | Source: Ambulatory Visit | Attending: Neuroradiology | Admitting: Neuroradiology

## 2022-09-07 ENCOUNTER — Encounter (HOSPITAL_COMMUNITY): Payer: Self-pay

## 2022-09-07 DIAGNOSIS — H93A9 Pulsatile tinnitus, unspecified ear: Secondary | ICD-10-CM | POA: Insufficient documentation

## 2022-09-07 DIAGNOSIS — I130 Hypertensive heart and chronic kidney disease with heart failure and stage 1 through stage 4 chronic kidney disease, or unspecified chronic kidney disease: Secondary | ICD-10-CM | POA: Insufficient documentation

## 2022-09-07 DIAGNOSIS — N189 Chronic kidney disease, unspecified: Secondary | ICD-10-CM | POA: Insufficient documentation

## 2022-09-07 DIAGNOSIS — E1122 Type 2 diabetes mellitus with diabetic chronic kidney disease: Secondary | ICD-10-CM | POA: Diagnosis not present

## 2022-09-07 DIAGNOSIS — Z8673 Personal history of transient ischemic attack (TIA), and cerebral infarction without residual deficits: Secondary | ICD-10-CM | POA: Insufficient documentation

## 2022-09-07 DIAGNOSIS — Z87891 Personal history of nicotine dependence: Secondary | ICD-10-CM | POA: Insufficient documentation

## 2022-09-07 DIAGNOSIS — H9312 Tinnitus, left ear: Secondary | ICD-10-CM

## 2022-09-07 DIAGNOSIS — I5032 Chronic diastolic (congestive) heart failure: Secondary | ICD-10-CM | POA: Diagnosis not present

## 2022-09-07 HISTORY — PX: IR ANGIO EXTERNAL CAROTID SEL EXT CAROTID BILAT MOD SED: IMG5372

## 2022-09-07 HISTORY — PX: IR US GUIDE VASC ACCESS RIGHT: IMG2390

## 2022-09-07 HISTORY — PX: IR ANGIO INTRA EXTRACRAN SEL INTERNAL CAROTID BILAT MOD SED: IMG5363

## 2022-09-07 LAB — BASIC METABOLIC PANEL
Anion gap: 10 (ref 5–15)
BUN: 16 mg/dL (ref 8–23)
CO2: 25 mmol/L (ref 22–32)
Calcium: 9.2 mg/dL (ref 8.9–10.3)
Chloride: 102 mmol/L (ref 98–111)
Creatinine, Ser: 1.32 mg/dL — ABNORMAL HIGH (ref 0.61–1.24)
GFR, Estimated: 59 mL/min — ABNORMAL LOW (ref >60)
Glucose, Bld: 257 mg/dL — ABNORMAL HIGH (ref 70–99)
Potassium: 4 mmol/L (ref 3.5–5.1)
Sodium: 137 mmol/L (ref 135–145)

## 2022-09-07 LAB — GLUCOSE, CAPILLARY
Glucose-Capillary: 270 mg/dL — ABNORMAL HIGH (ref 70–99)
Glucose-Capillary: 166 mg/dL — ABNORMAL HIGH (ref 70–99)

## 2022-09-07 LAB — CBC
HCT: 29.9 % — ABNORMAL LOW (ref 39.0–52.0)
Hemoglobin: 9.6 g/dL — ABNORMAL LOW (ref 13.0–17.0)
MCH: 29 pg (ref 26.0–34.0)
MCHC: 32.1 g/dL (ref 30.0–36.0)
MCV: 90.3 fL (ref 80.0–100.0)
Platelets: 383 10*3/uL (ref 150–400)
RBC: 3.31 MIL/uL — ABNORMAL LOW (ref 4.22–5.81)
RDW: 14.5 % (ref 11.5–15.5)
WBC: 5.3 10*3/uL (ref 4.0–10.5)
nRBC: 0 % (ref 0.0–0.2)

## 2022-09-07 LAB — PROTIME-INR
INR: 1.1 (ref 0.8–1.2)
Prothrombin Time: 14.5 seconds (ref 11.4–15.2)

## 2022-09-07 MED ORDER — FENTANYL CITRATE (PF) 100 MCG/2ML IJ SOLN
INTRAMUSCULAR | Status: AC
  Filled 2022-09-07: qty 2

## 2022-09-07 MED ORDER — IOHEXOL 300 MG/ML  SOLN
150.0000 mL | Freq: Once | INTRAMUSCULAR | Status: AC | PRN
  Administered 2022-09-07: 100 mL via INTRA_ARTERIAL

## 2022-09-07 MED ORDER — FENTANYL CITRATE (PF) 100 MCG/2ML IJ SOLN
INTRAMUSCULAR | Status: AC | PRN
  Administered 2022-09-07: 25 ug via INTRAVENOUS

## 2022-09-07 MED ORDER — LIDOCAINE HCL 1 % IJ SOLN
INTRAMUSCULAR | Status: AC
  Filled 2022-09-07: qty 20

## 2022-09-07 MED ORDER — SODIUM CHLORIDE 0.9 % IV SOLN: Freq: Once | INTRAVENOUS | Status: AC

## 2022-09-07 MED ORDER — HEPARIN SODIUM (PORCINE) 1000 UNIT/ML IJ SOLN
INTRAMUSCULAR | Status: AC
  Filled 2022-09-07: qty 10

## 2022-09-07 NOTE — H&P (Signed)
Chief Complaint: Patient was seen in consultation today for tinnitus at the request of de Marcial Pacas Rodrigues,Katyucia  Referring Physician(s): de Marcial Pacas Rodrigues,Katyucia  Supervising Physician: Baldemar Lenis  Patient Status: [facility] - Out-pt  History of Present Illness: Matthew Cain is a 66 y.o. male with PMH of  chronic diastolic heart failure, DM II, CVA, HTN and renal insufficiency. Pt c/o left sided pulsatile tinnitus that coincides with heartbeat that disrupts his sleep. 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 However, the images were degraded by motion. Pt met with Dr. Tommie Sams 08/31/22 for consult. Dr. Quay Burow proposed repeating cerebral angiogram in addition to venograms. Pt agreed to proceed and presents today for study.   Pt denies fever, chill, CP, SOB or N/V.  He states he ate a bologna sandwich at 0630 this morning.   Past Medical History:  Diagnosis Date   Amputation finger    left ring finger   Anxiety    CAD (coronary artery disease)    LHC 10/12:  mild plaque in the LAD but no obstructive CAD   Chronic diastolic heart failure (HCC)    EF 55-60%, grade 1 diastolic dysfunction; no WMAs, echo, 11/2011; s/p pulmo. edema c/b VDRF 11/12; Echo 09/08/11: severe LVH, no SAM, no LVOT gradient, EF 55%, mild BAE, mild reduced RVSF.   CKD (chronic kidney disease)    Depression    Diabetes mellitus    A1C 7.3 08/2011   GERD (gastroesophageal reflux disease)    History of kidney stones    History of right ACA stroke    10/12  --  MRI 09/13/11:  Acute right ACA infarct involving corpus callosum.  MRA 09/13/11: severe intracranial ASD, right ACA occluded, left PCA and right MCA with severe disease.  Dopplers were neg for significant ICA stenosis   HLD (hyperlipidemia)    Hypertension    Renal insufficiency    Spermatocele    Thoracic aortic aneurysm (HCC)    CT in 10/12:  4.4 cm ascending thoracic aortic aneurysm    Past Surgical History:  Procedure Laterality Date    vasectomy     bilateral   CARDIAC CATHETERIZATION  09/09/11   IR ANGIO EXTERNAL CAROTID SEL EXT CAROTID UNI R MOD SED  06/03/2021   IR ANGIO INTRA EXTRACRAN SEL COM CAROTID INNOMINATE UNI L MOD SED  06/03/2021   IR ANGIO INTRA EXTRACRAN SEL INTERNAL CAROTID UNI R MOD SED  06/03/2021   IR ANGIO VERTEBRAL SEL VERTEBRAL BILAT MOD SED  06/03/2021   IR US GUIDE VASC ACCESS RIGHT  06/03/2021   ROTATOR CUFF REPAIR  07/2015   LEFT   SPERMATOCELECTOMY  1990's   RT side   SPERMATOCELECTOMY Left 07/20/2015   Procedure: LEFT HYDROCELECTOMY;  Surgeon: Bjorn Pippin, MD;  Location: [facility];  Service: Urology;  Laterality: Left;   TRANSURETHRAL INCISION OF PROSTATE N/A 07/20/2015   Procedure: TRANSURETHRAL INCISION OF THE PROSTATE (TUIP);  Surgeon: Bjorn Pippin, MD;  Location: [facility];  Service: Urology;  Laterality: N/A;    Allergies: Gabapentin and Tomato  Medications: Prior to Admission medications   Medication Sig Start Date End Date Taking? Authorizing Provider  amLODipine (NORVASC) 10 MG tablet Take 1 tablet (10 mg total) by mouth every morning. 09/28/21  Yes Rollene Rotunda, MD  aspirin 81 MG EC tablet Take 81 mg by mouth daily.   Yes [provider]  atorvastatin (LIPITOR) 80 MG tablet  Take 80 mg by mouth at bedtime. 05/23/21  Yes [provider]  busPIRone (BUSPAR) 5 MG tablet Take 5-10 mg by mouth See admin instructions. Take 5 mg in the morning and 10 mg at night 08/14/22  Yes [provider]  carvedilol (COREG) 25 MG tablet Take 1 tablet (25 mg total) by mouth 2 (two) times daily. 09/28/21  Yes Minus Breeding, MD  clopidogrel (PLAVIX) 75 MG tablet Take 75 mg by mouth at bedtime. 03/05/22  Yes [provider]  diclofenac Sodium (VOLTAREN) 1 % GEL Apply 1 Application topically 4 (four) times daily as needed (pain).   Yes [provider]  diphenhydrAMINE HCl,  Sleep, (ZZZQUIL) 50 MG/30ML LIQD Take 50 mg by mouth at bedtime as needed (sleep).   Yes [provider]  DULoxetine (CYMBALTA) 60 MG capsule Take 60 mg by mouth daily. 10/04/18  Yes [provider]  famotidine (PEPCID) 40 MG tablet Take 40 mg by mouth at bedtime. 11/12/19  Yes [provider]  ferrous sulfate 325 (65 FE) MG tablet Take 325 mg by mouth 2 (two) times daily.   Yes [provider]  Garlic (GARLIQUE PO) Take 1 tablet by mouth at bedtime.   Yes [provider]  glipiZIDE (GLUCOTROL) 5 MG tablet Take 10 mg by mouth 2 (two) times daily. 07/29/22  Yes [provider]  insulin detemir (LEVEMIR FLEXTOUCH) 100 UNIT/ML FlexPen Inject 65 Units into the skin 2 (two) times daily. 06/24/21  Yes [provider]  isosorbide mononitrate (IMDUR) 30 MG 24 hr tablet Take 1 tablet (30 mg total) by mouth daily. 09/28/21  Yes Minus Breeding, MD  lisinopril (ZESTRIL) 40 MG tablet Take 1 tablet (40 mg total) by mouth daily. 09/28/21  Yes Minus Breeding, MD  loratadine (CLARITIN) 10 MG tablet Take 10 mg by mouth daily.   Yes [provider]  metFORMIN (GLUCOPHAGE) 1000 MG tablet Take 1,000 mg by mouth 2 (two) times daily.   Yes [provider]  montelukast (SINGULAIR) 10 MG tablet Take 1 tablet by mouth at bedtime. 04/26/21  Yes [provider]  Omega-3 Fatty Acids (FISH OIL) 1000 MG CAPS Take 1,000 mg by mouth at bedtime.   Yes [provider]  pantoprazole (PROTONIX) 40 MG tablet Take 40 mg by mouth at bedtime. 01/02/22  Yes [provider]  Pseudoephedrine-Ibuprofen (ADVIL COLD/SINUS PO) Take 1 tablet by mouth daily as needed (sinus headaches/ allergies).   Yes [provider]  spironolactone (ALDACTONE) 25 MG tablet Take 1 tablet (25 mg total) by mouth daily. 09/28/21  Yes Minus Breeding, MD  BD PEN NEEDLE NANO 2ND GEN 32G X 4 MM MISC USE WITH LANTUS SOLOSTAR TWICE DAILY 07/01/21   [provider]  Bartolo Darter COVID-19 AG HOME TEST KIT Use as Directed on the Package 06/16/21   [provider]  Carboxymethylcellul-Glycerin (CLEAR EYES FOR DRY EYES OP) Place 1 drop into both eyes daily as needed (dry eyes).    [provider]  nitroGLYCERIN (NITROSTAT) 0.4 MG SL tablet Place 1 tablet (0.4 mg total) under the tongue every 5 (five) minutes as needed for chest pain. 11/20/19   Herminio Commons, MD     Family History  Problem Relation Age of Onset   Heart attack Mother    Cerebral aneurysm Father    Cerebral aneurysm Other    Diabetes Other    Hypertension Other     Social History   Socioeconomic History   Marital status: Divorced  Spouse name: Not on file   Number of children: Not on file   Years of education: Not on file   Highest education level: Not on file  Occupational History   Not on file  Tobacco Use   Smoking status: Former    Packs/day: 1.00    Years: 30.00    Total pack years: 30.00    Types: Cigarettes    Quit date: 11/13/1994    Years since quitting: 27.8   Smokeless tobacco: Never  Vaping Use   Vaping Use: Never used  Substance and Sexual Activity   Alcohol use: No   Drug use: No   Sexual activity: Not Currently  Other Topics Concern   Not on file  Social History Narrative   Lives alone   Right Handed   Drinks 2-3 cups caffeine daily   Social Determinants of Health   Financial Resource Strain: Not on file  Food Insecurity: Not on file  Transportation Needs: Not on file  Physical Activity: Not on file  Stress: Not on file  Social Connections: Not on file    Review of Systems: A 12 point ROS discussed and pertinent positives are indicated in the HPI above.  All other systems are negative.  Review of Systems  Constitutional:  Negative for chills, fatigue and fever.  HENT:  Positive for tinnitus.   Respiratory:  Negative for shortness of breath.   Cardiovascular:  Negative for chest pain and leg swelling.   Gastrointestinal:  Negative for abdominal pain, nausea and vomiting.  Neurological:  Negative for dizziness, weakness and headaches.    Vital Signs: BP 120/62   Pulse 71   Temp 97.9 F (36.6 C) (Temporal)   Resp 17   Ht $R'5\' 11"'Qr$  (1.803 m)   Wt 210 lb (95.3 kg)   SpO2 96%   BMI 29.29 kg/m     Physical Exam Vitals reviewed.  Constitutional:      General: He is not in acute distress.    Appearance: Normal appearance. He is not ill-appearing.  HENT:     Head: Normocephalic and atraumatic.     Mouth/Throat:     Mouth: Mucous membranes are dry.     Pharynx: Oropharynx is clear.  Eyes:     Extraocular Movements: Extraocular movements intact.     Pupils: Pupils are equal, round, and reactive to light.  Cardiovascular:     Rate and Rhythm: Normal rate and regular rhythm.     Pulses: Normal pulses.     Heart sounds: Normal heart sounds. No murmur heard. Pulmonary:     Effort: Pulmonary effort is normal. No respiratory distress.     Breath sounds: Normal breath sounds.  Abdominal:     General: Bowel sounds are normal. There is no distension.     Palpations: Abdomen is soft.     Tenderness: There is no abdominal tenderness. There is no guarding.  Musculoskeletal:     Right lower leg: No edema.     Left lower leg: No edema.  Skin:    General: Skin is warm and dry.  Neurological:     Mental Status: He is alert and oriented to person, place, and time.  Psychiatric:        Mood and Affect: Mood normal.        Behavior: Behavior normal.        Thought Content: Thought content normal.        Judgment: Judgment normal.     Imaging: No results found.  Labs:  CBC: Recent Labs    11/28/21 1555 06/13/22 0307 06/13/22 0605 09/07/22 0819  WBC 6.2 7.9  --  5.3  HGB 11.0* 5.4* 6.5* 9.6*  HCT 35.0* 20.3* 19.0* 29.9*  PLT 406* 593*  --  383    COAGS: Recent Labs    09/07/22 0819  INR 1.1    BMP: Recent Labs    11/28/21 1555 06/13/22 0307 06/13/22 0605  09/07/22 0819  NA 132* 137 139 137  K 3.9 3.8 3.8 4.0  CL 95* 105 103 102  CO2 25 23  --  25  GLUCOSE 404* 169* 136* 257*  BUN $Re'20 13 13 16  'rCj$ CALCIUM 9.0 9.6  --  9.2  CREATININE 1.57* 1.33* 1.20 1.32*  GFRNONAA 48* 59*  --  59*    LIVER FUNCTION TESTS: Recent Labs    06/13/22 0307  BILITOT <0.1*  AST 19  ALT 17  ALKPHOS 53  PROT 6.8  ALBUMIN 4.0    TUMOR MARKERS: No results for input(s): "AFPTM", "CEA", "CA199", "CHROMGRNA" in the last 8760 hours.  Assessment and Plan:  66 yo male with complaint of left sided pulsatile tinnitus presents for cerebral angiogram with moderate sedation.  Pt resting on stretcher. He is A&O, calm and pleasant. He is in no distress.  Pt made aware that IR room is currently down with  no time frame for repair. He states he wants to wait to see if he can have procedure today.  Pt aware that he is not able to have sedation until 1230 since he ate breakfast.  Risks and benefits of cerebral angiogram with intervention were discussed with the patient including, but not limited to bleeding, infection, vascular injury, contrast induced renal failure, stroke or even death.  This interventional procedure involves the use of X-rays and because of the nature of the planned procedure, it is possible that we will have prolonged use of X-ray fluoroscopy.  Potential radiation risks to you include (but are not limited to) the following: - A slightly elevated risk for cancer  several years later in life. This risk is typically less than 0.5% percent. This risk is low in comparison to the normal incidence of human cancer, which is 33% for women and 50% for men according to the [place]. - Radiation induced injury can include skin redness, resembling a rash, tissue breakdown / ulcers and hair loss (which can be temporary or permanent).   The likelihood of either of these occurring depends on the difficulty of the procedure and whether you are  sensitive to radiation due to previous procedures, disease, or genetic conditions.   IF your procedure requires a prolonged use of radiation, you will be notified and given written instructions for further action.  It is your responsibility to monitor the irradiated area for the 2 weeks following the procedure and to notify your physician if you are concerned that you have suffered a radiation induced injury.    All of the patient's questions were answered, patient is agreeable to proceed.  Consent signed and in chart.   Thank you for this interesting consult.  I greatly enjoyed meeting Matthew Cain and look forward to participating in their care.  A copy of this report was sent to the requesting provider on this date.  Electronically Signed: Tyson Alias, NP 09/07/2022, 9:15 AM   I spent a total of 20 minutes in face to face in clinical consultation, greater than 50% of which was counseling/coordinating care  for left sided pulsatile tinnitus.

## 2022-09-07 NOTE — Progress Notes (Signed)
Pt ambulated w/o difficulty-NAD-d/c home with Tommy/cousin

## 2022-09-07 NOTE — Sedation Documentation (Signed)
Pt transported to Short stay room 10 via stretcher accompanied by RN. [place] RN at bedside to receive pt. Handoff complete. Right groin remains level 0. Bilateral lower distal pulses palpable. Pt awake alert and oriented. No s/s of distress. Pt awake alert and oriented.

## 2022-09-08 ENCOUNTER — Other Ambulatory Visit (HOSPITAL_COMMUNITY): Payer: Self-pay | Admitting: Neuroradiology

## 2022-09-08 DIAGNOSIS — I6350 Cerebral infarction due to unspecified occlusion or stenosis of unspecified cerebral artery: Secondary | ICD-10-CM

## 2022-09-08 DIAGNOSIS — H9319 Tinnitus, unspecified ear: Secondary | ICD-10-CM

## 2022-09-08 DIAGNOSIS — H93A9 Pulsatile tinnitus, unspecified ear: Secondary | ICD-10-CM

## 2022-09-08 HISTORY — PX: IR ANGIO VERTEBRAL SEL VERTEBRAL UNI R MOD SED: IMG5368

## 2022-09-14 ENCOUNTER — Other Ambulatory Visit: Payer: Self-pay | Admitting: Neurology

## 2022-09-19 ENCOUNTER — Other Ambulatory Visit: Payer: Self-pay

## 2022-09-19 MED ORDER — CLOPIDOGREL BISULFATE 75 MG PO TABS: 75.0000 mg | ORAL_TABLET | Freq: Every day | ORAL | 3 refills | Status: AC

## 2022-09-20 ENCOUNTER — Other Ambulatory Visit (HOSPITAL_COMMUNITY): Payer: Self-pay | Admitting: Neuroradiology

## 2022-09-20 MED ORDER — CLOPIDOGREL BISULFATE 75 MG PO TABS: 75.0000 mg | ORAL_TABLET | Freq: Every day | ORAL | 0 refills | Status: DC

## 2022-09-20 MED ORDER — ASPIRIN 81 MG PO TBEC: 81.0000 mg | DELAYED_RELEASE_TABLET | Freq: Every day | ORAL | 12 refills | Status: AC

## 2022-09-26 ENCOUNTER — Other Ambulatory Visit: Payer: Self-pay | Admitting: Student

## 2022-09-27 ENCOUNTER — Other Ambulatory Visit: Payer: Self-pay

## 2022-09-27 ENCOUNTER — Encounter (HOSPITAL_COMMUNITY): Payer: Self-pay

## 2022-09-27 ENCOUNTER — Other Ambulatory Visit: Payer: Self-pay | Admitting: Radiology

## 2022-09-27 ENCOUNTER — Other Ambulatory Visit: Payer: Self-pay | Admitting: Student

## 2022-09-27 DIAGNOSIS — H9312 Tinnitus, left ear: Secondary | ICD-10-CM

## 2022-09-27 NOTE — Progress Notes (Signed)
PCP - Pt can not remember the name  Cardiologist - Dr. Antoine Poche  EP- Denies  Endocrine- Denies  Pulm- Denies  Chest x-ray - 11/28/21 (E)  EKG - 06/13/22 (E)  Stress Test - 11/27/2018 (E)  ECHO - 11/26/2018 (E)  Cardiac Cath - Denies  AICD-na PM-na LOOP-na  Nerve Stimulator- Denies  Dialysis- Denies  Sleep Study - Denies CPAP - Denies  LABS- 09/28/22: CBC w/D, BMP, PT  ASA-Cont. PLAVIX- Cont.  ERAS- No  HA1C- 06/29/22(E): 7.3 Fasting Blood Sugar - 102-141 Checks Blood Sugar ___1__ time a day  Anesthesia- Yes- EKG and surgical order  Pt denies having chest pain, sob, or fever during the pre-op phone call. All instructions explained to the pt, with a verbal understanding of the material. Pt also instructed to wear a mask and social distance if he goes out. The opportunity to ask questions was provided.

## 2022-09-27 NOTE — H&P (Signed)
Chief Complaint: Pulsatile Tinnitus  Referring Physician(s): Antony Contras, MD   Supervising Physician: Pedro Earls  Patient Status: [facility] - Out-pt  History of Present Illness: Matthew Cain is a 66 y.o. male Matthew Cain is a 66 year old male with past medical history significant for chronic diastolic heart failure, DM II, CVA, HTN and renal insufficiency.   Pt c/o left sided pulsatile tinnitus that coincides with heartbeat that disrupts his sleep.   Pt underwent cerebral angiogram done 09/07/22 showed= Venous drainage of the intracranial circulation is patent with prominent anterior drainage via the sphenoparietal sinus on the left side, left occipital emissary veins and posterior cervical vein branching from the right jugular. Correlation prior CT angiogram showed prominent bilateral posterior belly of the digastric muscle resulting in compression of the jugular veins between the muscle and the internal carotid arteries. This is likely the cause of prominent venous drainage of the intracranial circulation via left sphenoparietal sinus and sigmoid/jugular drainage via emissary veins. The emissary vein draining the left sigmoid sinus shows a focal extracranial dilatation in the left occipital area that corresponds to the location where the patient feels in hear the pulsatile tinnitus.  He is here today for cerebral angiography with endovascular embolization of the focal dilatation of emissary vein at the location of clinical concern.  He is NPO. No nausea/vomiting. No Fever/chills. ROS negative.   Past Medical History:  Diagnosis Date   Amputation finger    left ring finger   Anxiety    CAD (coronary artery disease)    LHC 10/12:  mild plaque in the LAD but no obstructive CAD   Chronic diastolic heart failure (HCC)    EF 20-25%, grade 1 diastolic dysfunction; no WMAs, echo, 11/2011; s/p pulmo. edema c/b VDRF 11/12; Echo 09/08/11: severe LVH, no SAM, no LVOT  gradient, EF 55%, mild BAE, mild reduced RVSF.   CKD (chronic kidney disease)    Depression    Diabetes mellitus    Type II   GERD (gastroesophageal reflux disease)    History of kidney stones    History of right ACA stroke    10/12  --  MRI 09/13/11:  Acute right ACA infarct involving corpus callosum.  MRA 09/13/11: severe intracranial ASD, right ACA occluded, left PCA and right MCA with severe disease.  Dopplers were neg for significant ICA stenosis   HLD (hyperlipidemia)    Hypertension    Renal insufficiency    Spermatocele    Thoracic aortic aneurysm (HCC)    CT in 10/12: 4.4 cm ascending thoracic aortic aneurysm    Past Surgical History:  Procedure Laterality Date    vasectomy     bilateral   CARDIAC CATHETERIZATION  09/09/11   IR ANGIO EXTERNAL CAROTID SEL EXT CAROTID BILAT MOD SED  09/07/2022   IR ANGIO EXTERNAL CAROTID SEL EXT CAROTID UNI R MOD SED  06/03/2021   IR ANGIO INTRA EXTRACRAN SEL COM CAROTID INNOMINATE UNI L MOD SED  06/03/2021   IR ANGIO INTRA EXTRACRAN SEL INTERNAL CAROTID BILAT MOD SED  09/07/2022   IR ANGIO INTRA EXTRACRAN SEL INTERNAL CAROTID UNI R MOD SED  06/03/2021   IR ANGIO VERTEBRAL SEL VERTEBRAL BILAT MOD SED  06/03/2021   IR ANGIO VERTEBRAL SEL VERTEBRAL UNI R MOD SED  09/08/2022   IR US GUIDE VASC ACCESS RIGHT  06/03/2021   IR US GUIDE VASC ACCESS RIGHT  09/07/2022   ROTATOR CUFF REPAIR  07/2015   LEFT  SPERMATOCELECTOMY  1990's   RT side   SPERMATOCELECTOMY Left 07/20/2015   Procedure: LEFT HYDROCELECTOMY;  Surgeon: Irine Seal, MD;  Location: [facility];  Service: Urology;  Laterality: Left;   TRANSURETHRAL INCISION OF PROSTATE N/A 07/20/2015   Procedure: TRANSURETHRAL INCISION OF THE PROSTATE (TUIP);  Surgeon: Irine Seal, MD;  Location: [facility];  Service: Urology;  Laterality: N/A;    Allergies: Gabapentin and Tomato  Medications: Prior to Admission medications   Medication Sig Start Date End Date Taking? Authorizing Provider  amLODipine (NORVASC)  10 MG tablet Take 1 tablet (10 mg total) by mouth every morning. 09/28/21   Minus Breeding, MD  aspirin EC 81 MG tablet Take 1 tablet (81 mg total) by mouth daily. Swallow whole. 09/20/22   de Rosario Jacks, MD  atorvastatin (LIPITOR) 80 MG tablet Take 80 mg by mouth at bedtime. 05/23/21   [provider]  BD PEN NEEDLE NANO 2ND GEN 32G X 4 MM MISC USE WITH LANTUS SOLOSTAR TWICE DAILY 07/01/21   [provider]  Bartolo Darter COVID-19 AG HOME TEST KIT Use as Directed on the Package 06/16/21   [provider]  busPIRone (BUSPAR) 5 MG tablet Take 5 mg by mouth 3 (three) times daily. 08/14/22   [provider]  Carboxymethylcellul-Glycerin (CLEAR EYES FOR DRY EYES OP) Place 1 drop into both eyes daily as needed (dry eyes).    [provider]  carvedilol (COREG) 25 MG tablet Take 1 tablet (25 mg total) by mouth 2 (two) times daily. 09/28/21   Minus Breeding, MD  clopidogrel (PLAVIX) 75 MG tablet Take 1 tablet (75 mg total) by mouth daily. 09/19/22   Garvin Fila, MD  diclofenac Sodium (VOLTAREN) 1 % GEL Apply 1 Application topically 4 (four) times daily as needed (pain).    [provider]  diphenhydrAMINE HCl, Sleep, (ZZZQUIL) 50 MG/30ML LIQD Take 50 mg by mouth at bedtime as needed (sleep).    [provider]  DULoxetine (CYMBALTA) 60 MG capsule Take 60 mg by mouth daily. 10/04/18   [provider]  famotidine (PEPCID) 40 MG tablet Take 40 mg by mouth at bedtime. 11/12/19   [provider]  ferrous sulfate 325 (65 FE) MG tablet Take 325 mg by mouth 2 (two) times daily.    [provider]  Garlic (GARLIQUE PO) Take 1 tablet by mouth at bedtime.    [provider]  glipiZIDE (GLUCOTROL) 5 MG tablet Take 10 mg by mouth 2 (two) times daily. 07/29/22   [provider]  insulin detemir (LEVEMIR FLEXTOUCH) 100 UNIT/ML FlexPen Inject 65 Units into the skin 2 (two) times daily. 06/24/21   [provider]  isosorbide mononitrate (IMDUR) 30 MG 24 hr tablet Take 1 tablet (30 mg total) by mouth daily. 09/28/21   Minus Breeding, MD  lisinopril (ZESTRIL) 40 MG tablet Take 1 tablet (40 mg total) by mouth daily. 09/28/21   Minus Breeding, MD  loratadine (CLARITIN) 10 MG tablet Take 10 mg by mouth daily.    [provider]  metFORMIN (GLUCOPHAGE) 1000 MG tablet Take 1,000 mg by mouth 2 (two) times daily.    [provider]  montelukast (SINGULAIR) 10 MG tablet Take 10 mg by mouth at bedtime. 04/26/21   [provider]  nitroGLYCERIN (NITROSTAT) 0.4 MG SL tablet Place 1 tablet (0.4 mg total) under the tongue every 5 (five) minutes as needed for chest pain. 11/20/19   Herminio Commons, MD  Omega-3 Fatty Acids (  FISH OIL) 1000 MG CAPS Take 1,000 mg by mouth at bedtime.    [provider]  pantoprazole (PROTONIX) 40 MG tablet Take 40 mg by mouth at bedtime. 01/02/22   [provider]  Pseudoephedrine-Ibuprofen (ADVIL COLD/SINUS PO) Take 1 tablet by mouth daily as needed (sinus headaches/ allergies).    [provider]  spironolactone (ALDACTONE) 25 MG tablet Take 1 tablet (25 mg total) by mouth daily. 09/28/21   Minus Breeding, MD     Family History  Problem Relation Age of Onset   Heart attack Mother    Cerebral aneurysm Father    Cerebral aneurysm Other    Diabetes Other    Hypertension Other     Social History   Socioeconomic History   Marital status: Divorced    Spouse name: Not on file   Number of children: Not on file   Years of education: Not on file   Highest education level: Not on file  Occupational History   Not on file  Tobacco Use   Smoking status: Former    Packs/day: 1.00    Years: 30.00    Total pack years: 30.00    Types: Cigarettes    Quit date: 11/13/1994    Years since quitting: 27.8   Smokeless tobacco: Never  Vaping Use   Vaping Use: Never used  Substance and Sexual Activity   Alcohol use: Yes     Comment: Occasional   Drug use: No   Sexual activity: Not Currently  Other Topics Concern   Not on file  Social History Narrative   Lives alone   Right Handed   Drinks 2-3 cups caffeine daily   Social Determinants of Health   Financial Resource Strain: Not on file  Food Insecurity: Not on file  Transportation Needs: Not on file  Physical Activity: Not on file  Stress: Not on file  Social Connections: Not on file     Review of Systems: A 12 point ROS discussed and pertinent positives are indicated in the HPI above.  All other systems are negative.  Review of Systems  Vital Signs: Wt Readings from Last 3 Encounters:  09/28/22 212 lb (96.2 kg)  09/07/22 210 lb (95.3 kg)  08/15/22 220 lb (99.8 kg)   Temp Readings from Last 3 Encounters:  09/28/22 98.5 F (36.9 C) (Oral)  09/07/22 97.9 F (36.6 C) (Temporal)  06/13/22 98 F (36.7 C) (Oral)   BP Readings from Last 3 Encounters:  09/28/22 127/68  09/07/22 132/65  08/15/22 (!) 166/71   Pulse Readings from Last 3 Encounters:  09/28/22 73  09/07/22 76  08/15/22 81     Physical Exam Vitals reviewed.  Constitutional:      Appearance: Normal appearance.  HENT:     Head: Normocephalic and atraumatic.  Eyes:     Extraocular Movements: Extraocular movements intact.  Cardiovascular:     Rate and Rhythm: Normal rate and regular rhythm.  Pulmonary:     Effort: Pulmonary effort is normal. No respiratory distress.     Breath sounds: Normal breath sounds.  Abdominal:     General: There is no distension.     Palpations: Abdomen is soft.     Tenderness: There is no abdominal tenderness.  Musculoskeletal:        General: Normal range of motion.     Cervical back: Normal range of motion.  Skin:    General: Skin is warm and dry.  Neurological:     General: No focal  deficit present.     Mental Status: He is alert and oriented to person, place, and time.  Psychiatric:        Mood and Affect: Mood normal.         Behavior: Behavior normal.        Thought Content: Thought content normal.        Judgment: Judgment normal.      Labs:  CBC: Recent Labs    11/28/21 1555 06/13/22 0307 06/13/22 0605 09/07/22 0819 09/28/22 0639  WBC 6.2 7.9  --  5.3 5.0  HGB 11.0* 5.4* 6.5* 9.6* 9.1*  HCT 35.0* 20.3* 19.0* 29.9* 27.9*  PLT 406* 593*  --  383 355    COAGS: Recent Labs    09/07/22 0819 09/28/22 0639  INR 1.1 1.1    BMP: Recent Labs    11/28/21 1555 06/13/22 0307 06/13/22 0605 09/07/22 0819 09/28/22 0639  NA 132* 137 139 137 131*  K 3.9 3.8 3.8 4.0 3.9  CL 95* 105 103 102 99  CO2 25 23  --  25 22  GLUCOSE 404* 169* 136* 257* 211*  BUN _0 CALCIUM 9.0 9.6  --  9.2 8.6*  CREATININE 1.57* 1.33* 1.20 1.32* 1.16  GFRNONAA 48* 59*  --  59* >60    LIVER FUNCTION TESTS: Recent Labs    06/13/22 0307  BILITOT <0.1*  AST 19  ALT 17  ALKPHOS 53  PROT 6.8  ALBUMIN 4.0    TUMOR MARKERS: No results for input(s): "AFPTM", "CEA", "CA199", "CHROMGRNA" in the last 8760 hours.  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   Will proceed with cerebral angiography with endovascular embolization of the focal dilatation of emissary vein today by Dr. Karenann Cai.  Risks and benefits of cerebral angiogram with intervention were discussed with the patient including, but not limited to bleeding, infection, vascular injury, contrast induced renal failure, stroke or even death.  This interventional procedure involves the use of X-rays and because of the nature of the planned procedure, it is possible that we will have prolonged use of X-ray fluoroscopy.  Potential radiation risks to you include (but are not limited to) the following: - A slightly elevated risk for cancer  several years later in life. This risk is typically less than 0.5% percent. This risk is low in comparison to the normal incidence of human cancer, which is 33%  for women and 50% for men according to the [place]. - Radiation induced injury can include skin redness, resembling a rash, tissue breakdown / ulcers and hair loss (which can be temporary or permanent).   The likelihood of either of these occurring depends on the difficulty of the procedure and whether you are sensitive to radiation due to previous procedures, disease, or genetic conditions.   IF your procedure requires a prolonged use of radiation, you will be notified and given written instructions for further action.  It is your responsibility to monitor the irradiated area for the 2 weeks following the procedure and to notify your physician if you are concerned that you have suffered a radiation induced injury.    All of the patient's questions were answered, patient is agreeable to proceed.  Consent signed and in chart.   Thank you for allowing our service to participate in Matthew Cain 's care.  Electronically Signed: Murrell Redden, PA-C   09/28/2022, 8:06 AM      I  spent a total of    25 Minutes in face to face in clinical consultation, greater than 50% of which was counseling/coordinating care for cerebral intervention.

## 2022-09-27 NOTE — Anesthesia Preprocedure Evaluation (Addendum)
Anesthesia Evaluation  Patient identified by MRN, date of birth, ID band Patient awake    Reviewed: Allergy & Precautions, H&P , NPO status , Patient's Chart, lab work & pertinent test results  Airway Mallampati: II   Neck ROM: full    Dental   Pulmonary former smoker   breath sounds clear to auscultation       Cardiovascular hypertension, +CHF   Rhythm:regular Rate:Normal  Cath (2012): mild non-obstructive CAD  Ascending thoracic aortic aneurysm 4.4cm.   Neuro/Psych  PSYCHIATRIC DISORDERS Anxiety Depression    CVA    GI/Hepatic ,GERD  ,,  Endo/Other  diabetes, Type 2    Renal/GU Renal InsufficiencyRenal diseasestones     Musculoskeletal   Abdominal   Peds  Hematology   Anesthesia Other Findings   Reproductive/Obstetrics                             Anesthesia Physical Anesthesia Plan  ASA: 3  Anesthesia Plan: General   Post-op Pain Management:    Induction: Intravenous  PONV Risk Score and Plan: 2 and Ondansetron, Dexamethasone, Midazolam and Treatment may vary due to age or medical condition  Airway Management Planned: Oral ETT  Additional Equipment: Arterial line  Intra-op Plan:   Post-operative Plan: Extubation in OR  Informed Consent: I have reviewed the patients History and Physical, chart, labs and discussed the procedure including the risks, benefits and alternatives for the proposed anesthesia with the patient or authorized representative who has indicated his/her understanding and acceptance.     Dental advisory given  Plan Discussed with: CRNA, Anesthesiologist and Surgeon  Anesthesia Plan Comments: (PAT note by Antionette Poles, PA-C: Follows with cardiology for history of nonobstructive CAD by cath 2012, left bundle branch block, palpitations, HFpEF, hypertensive heart disease thoracic aortic aneurysm (42 mm, stable by MRI 08/2020).  Nuclear stress January 2020 was  low risk, nonischemic.  Echo January 2020 showed normal EF with severe septal hypertrophy and grade 1 diastolic dysfunction.  Cardiac MRI October 2021 showed no S.A.M. and no LVOT obstruction; no evidence for hypertrophic cardiomyopathy but rather hypertensive heart disease.  Last seen by Dr. Antoine Poche 09/28/2021.  No anginal symptoms, no further evaluation recommended for CAD.  No changes overall management.  Zio patch was ordered to screen for arrhythmias and showed underlying rhythm to be sinus, runs of SVT (longest run 10 beats), no sustained arrhythmias.  1 year follow-up was recommended.  History of right ACA stroke 2012.  Dopplers negative for significant ICA stenosis at that time.  History of renal insufficiency.  History of iron deficiency anemia with occasional transfusion requirement.  Most recently seen in the ED on 06/13/2022 and noted to have low iron with hemoglobin of 5.  He received 2 units PRBCs.  He was subsequent seen in follow-up by his PCP Dr. Wyline Mood on 06/29/2022 and recommended continue supplemental iron.  IDDM 2, A1c 7.3 on 06/29/2022.  BMP and CBC from 09/07/2022 reviewed, creatinine mildly elevated 1.32, moderate anemia with hemoglobin 9.6, otherwise unremarkable.  EKG 06/13/2022: Sinus rhythm.  Rate 82.  Short PR interval.  Left bundle branch block.  IR angiography 09/07/2022: IMPRESSION: 1. Venous drainage of the intracranial circulation is patent with prominent anterior drainage via the sphenoparietal sinus on the left side, left occipital emissary veins and posterior cervical vein branching from the right jugular. Correlation prior CT angiogram showed prominent bilateral posterior belly of the digastric muscle resulting in compression of the jugular  veins between the muscle and the internal carotid arteries. This is likely the cause of prominent venous drainage of the intracranial circulation via left sphenoparietal sinus and sigmoid/jugular drainage via  emissary veins. The emissary vein draining the left sigmoid sinus shows a focal extracranial dilatation in the left occipital area that corresponds to the location where the patient feels in hear the pulsatile tinnitus. 2. No evidence of craniocervical arteriovenous fistula. 3. Severe intracranial atherosclerotic disease, more pronounced in the posterior circulation, as detailed above.  PLAN: Findings discussed with the patient after the procedure. He would like to proceed with endovascular embolization of the focal dilatation of emissary vein at the location of clinical concern.  Zio patch 09/28/2021: Normal sinus rhythm Runs of supraventricular tachycardia The longest run was 10 beats. No sustained arrhythmias  Cardiac MRI 08/27/2020: IMPRESSION: 1. Normal left ventricular size with severe basal septal hypertrophy (18 mm) and moderate hypertrophy of the remaining myocardium (13 mm) with normal systolic function (LVEF = 64%). There are no regional Grismore motion abnormalities and no late gadolinium enhancement in the left ventricular myocardium. There is no SAM and no LVOT obstruction.  2. Normal right ventricular size, thickness and systolic function (LVEF = 50%). There are no regional Staheli motion abnormalities.  3. Normal left and right atrial size.  4. Mild ascending aortic aneurysm with maximum diameter 42 mm.  5. Mildly dilated pulmonary artery measuring 33 mm.  6. No significant valvular abnormalities.  There is no evidence for a hypertrophic cardiomyopathy but rather a hypertensive heart disease.  Nuclear stress 11/27/2018: ? No diagnostic ST segment changes to indicate ischemia. ? Small, mild intensity, partially reversible inferior defect most prominent on rest imaging and therefore suggestive of variable soft tissue attenuation. No definitive ischemic defects. ? This is a low risk study. ? Nuclear stress EF: 49%. Visually, LV contraction appears  normal.  TTE 11/26/2018: - Left ventricle: The cavity size was normal. Systolic function was  normal. The estimated ejection fraction was in the range of 60%  to 65%. Mandrell motion was normal; there were no regional Plack  motion abnormalities. Doppler parameters are consistent with  abnormal left ventricular relaxation (grade 1 diastolic  dysfunction). Doppler parameters are consistent with high  ventricular filling pressure. Severe focal basal septal  hypertrophy and otherwise moderate hypertrophy of remaining  myocardium.  - Aortic valve: Trileaflet; mildly thickened leaflets.  - Mitral valve: Moderately calcified annulus.     )        Anesthesia Quick Evaluation

## 2022-09-27 NOTE — Progress Notes (Signed)
S.D.W- Instructions   Your procedure is scheduled on Thurs., Nov. 16, 2023 from 8:30AM-11:30AM.  Report to [facility] Main Entrance "A" at 6:00 A.M., then check in with the Admitting office.  Call this number if you have problems the morning of surgery:  [phone]             If you experience any cold or flu symptoms such as cough, fever, chills, shortness of breath, etc. between now and your scheduled surgery, please notify us at the above         number.  Remember:  Do not eat  or drink after midnight on Nov. 15th    Take these medicines the morning of surgery with A SIP OF WATER: AmLODipine (NORVASC)  Aspirin- Take 4 tabs BusPIRone (BUSPAR)  Carvedilol (COREG)  Clopidogrel (PLAVIX)  DULoxetine (CYMBALTA)  Loratadine (CLARITIN)   As of today, STOP taking any Aspirin (unless otherwise instructed by your surgeon) Aleve, Naproxen, Ibuprofen, Motrin, Advil, Goody's, BC's, all herbal medications, fish oil, and all vitamins.   How to Manage Your Diabetes Before and After Surgery  How do I manage my blood sugar before surgery? Check your blood sugar the morning of your surgery when you wake up and every 2 hours until you get to the Short Stay unit. If your blood sugar is less than 70 mg/dL, you will need to treat for low blood sugar: Do not take insulin. Treat a low blood sugar (less than 70 mg/dL) with  cup of clear juice (cranberry or apple), 4 glucose tablets, OR glucose gel. Recheck blood sugar in 15 minutes after treatment (to make sure it is greater than 70 mg/dL). If your blood sugar is not greater than 70 mg/dL on recheck, call [phone]  for further instructions. Report your blood sugar to the short stay nurse when you get to Short Stay.  WHAT DO I DO ABOUT MY DIABETES MEDICATION?  Do not take oral diabetes medicines (pills) the morning of surgery.  THE NIGHT BEFORE SURGERY, take _____32______ units of _____Levemir______insulin.       THE MORNING OF SURGERY, take  _______32______ units of _____Levemir_____insulin.  If your CBG is greater than 220 mg/dL, inform the staff upon arrival to Pre-Op.  Reviewed and Endorsed by [facility] Patient Education Committee, August 2015  Do not wear jewelry. Do not wear lotions, powders, cologne or deodorant. Do not shave 48 hours prior to surgery.  Men may shave face and neck. Do not bring valuables to the hospital.  [facility] is not responsible for any belongings or valuables.    Do NOT Smoke (Tobacco/Vaping)  24 hours prior to your procedure  If you use a CPAP at night, you may bring your mask for your overnight stay.   Contacts, glasses, hearing aids, dentures or partials may not be worn into surgery, please bring cases for these belongings   For patients admitted to the hospital, discharge time will be determined by your treatment team.   Patients discharged the day of surgery will not be allowed to drive home, and someone needs to stay with them for 24 hours.  Special instructions:    Oral Hygiene is also important to reduce your risk of infection.  Remember - BRUSH YOUR TEETH THE MORNING OF SURGERY WITH YOUR REGULAR TOOTHPASTE  [place]- Preparing For Surgery  Before surgery, you can play an important role. Because skin is not sterile, your skin needs to be as free of germs as possible. You can reduce the  number of germs on your skin by washing with Antibacterial Soap before surgery.     Please follow these instructions carefully.     Shower the NIGHT BEFORE SURGERY and the MORNING OF SURGERY with Antibacterial Soap.   Pat yourself dry with a CLEAN TOWEL.  Wear CLEAN PAJAMAS to bed the night before surgery  Place CLEAN SHEETS on your bed the night before your surgery  DO NOT SLEEP WITH PETS.  Day of Surgery:  Take a shower with Antibacterial soap. Wear Clean/Comfortable clothing the morning of surgery Do not apply any deodorants/lotions.   Remember to brush your teeth WITH YOUR  REGULAR TOOTHPASTE.   If you test positive for Covid, or been in contact with anyone that has tested positive in the last 10 days, please notify your surgeon.  SURGICAL WAITING ROOM VISITATION Patients having surgery or a procedure may have no more than 2 support people in the waiting area - these visitors may rotate.   Children under the age of 36 must have an adult with them who is not the patient. If the patient needs to stay at the hospital during part of their recovery, the visitor guidelines for inpatient rooms apply. Pre-op nurse will coordinate an appropriate time for 1 support person to accompany patient in pre-op.  This support person may not rotate.   Please refer to the [facility] website for the visitor guidelines for Inpatients (after your surgery is over and you are in a regular room).

## 2022-09-27 NOTE — Progress Notes (Addendum)
Anesthesia Chart Review:  Follows with cardiology for history of nonobstructive CAD by cath 2012, left bundle branch block, palpitations, HFpEF, hypertensive heart disease thoracic aortic aneurysm (42 mm, stable by MRI 08/2020).  Nuclear stress January 2020 was low risk, nonischemic.  Echo January 2020 showed normal EF with severe septal hypertrophy and grade 1 diastolic dysfunction.  Cardiac MRI October 2021 showed no S.A.M. and no LVOT obstruction; no evidence for hypertrophic cardiomyopathy but rather hypertensive heart disease.  Last seen by Dr. Antoine Poche 09/28/2021.  No anginal symptoms, no further evaluation recommended for CAD.  No changes overall management.  Zio patch was ordered to screen for arrhythmias and showed underlying rhythm to be sinus, runs of SVT (longest run 10 beats), no sustained arrhythmias.  1 year follow-up was recommended.  History of right ACA stroke 2012.  Dopplers negative for significant ICA stenosis at that time.  History of renal insufficiency.  History of iron deficiency anemia with occasional transfusion requirement.  Most recently seen in the ED on 06/13/2022 and noted to have low iron with hemoglobin of 5.  He received 2 units PRBCs.  He was subsequent seen in follow-up by his PCP Dr. Wyline Mood on 06/29/2022 and recommended continue supplemental iron.   IDDM 2, A1c 7.3 on 06/29/2022.  BMP and CBC from 09/07/2022 reviewed, creatinine mildly elevated 1.32, moderate anemia with hemoglobin 9.6, otherwise unremarkable.  EKG 06/13/2022: Sinus rhythm.  Rate 82.  Short PR interval.  Left bundle branch block.  IR angiography 09/07/2022: IMPRESSION: 1. Venous drainage of the intracranial circulation is patent with prominent anterior drainage via the sphenoparietal sinus on the left side, left occipital emissary veins and posterior cervical vein branching from the right jugular. Correlation prior CT angiogram showed prominent bilateral posterior belly of the digastric  muscle resulting in compression of the jugular veins between the muscle and the internal carotid arteries. This is likely the cause of prominent venous drainage of the intracranial circulation via left sphenoparietal sinus and sigmoid/jugular drainage via emissary veins. The emissary vein draining the left sigmoid sinus shows a focal extracranial dilatation in the left occipital area that corresponds to the location where the patient feels in hear the pulsatile tinnitus. 2. No evidence of craniocervical arteriovenous fistula. 3. Severe intracranial atherosclerotic disease, more pronounced in the posterior circulation, as detailed above.   PLAN: Findings discussed with the patient after the procedure. He would like to proceed with endovascular embolization of the focal dilatation of emissary vein at the location of clinical concern.  Zio patch 09/28/2021: Normal sinus rhythm Runs of supraventricular tachycardia The longest run was 10 beats. No sustained arrhythmias  Cardiac MRI 08/27/2020: IMPRESSION: 1. Normal left ventricular size with severe basal septal hypertrophy (18 mm) and moderate hypertrophy of the remaining myocardium (13 mm) with normal systolic function (LVEF = 64%). There are no regional Walberg motion abnormalities and no late gadolinium enhancement in the left ventricular myocardium. There is no SAM and no LVOT obstruction.   2. Normal right ventricular size, thickness and systolic function (LVEF = 50%). There are no regional Flatley motion abnormalities.   3. Normal left and right atrial size.   4. Mild ascending aortic aneurysm with maximum diameter 42 mm.   5. Mildly dilated pulmonary artery measuring 33 mm.   6. No significant valvular abnormalities.   There is no evidence for a hypertrophic cardiomyopathy but rather a hypertensive heart disease.  Nuclear stress 11/27/2018: No diagnostic ST segment changes to indicate ischemia. Small, mild intensity,  partially reversible  inferior defect most prominent on rest imaging and therefore suggestive of variable soft tissue attenuation. No definitive ischemic defects. This is a low risk study. Nuclear stress EF: 49%. Visually, LV contraction appears normal.  TTE 11/26/2018: - Left ventricle: The cavity size was normal. Systolic function was    normal. The estimated ejection fraction was in the range of 60%    to 65%. Mcilvain motion was normal; there were no regional Min    motion abnormalities. Doppler parameters are consistent with    abnormal left ventricular relaxation (grade 1 diastolic    dysfunction). Doppler parameters are consistent with high    ventricular filling pressure. Severe focal basal septal    hypertrophy and otherwise moderate hypertrophy of remaining    myocardium.  - Aortic valve: Trileaflet; mildly thickened leaflets.  - Mitral valve: Moderately calcified annulus.     Zannie Cove [facility] Short Stay Center/Anesthesiology Phone [phone] 09/27/2022 10:52 AM

## 2022-09-27 NOTE — H&P (Deleted)
  The note originally documented on this encounter has been moved the the encounter in which it belongs.  

## 2022-09-28 ENCOUNTER — Encounter (HOSPITAL_COMMUNITY)
Admission: RE | Disposition: A | Discharge status: Home / Self Care | Payer: Self-pay | Source: Home / Self Care | Attending: Neuroradiology

## 2022-09-28 ENCOUNTER — Inpatient Hospital Stay (HOSPITAL_COMMUNITY)
Admission: RE | Admit: 2022-09-28 | Discharge: 2022-09-29 | DRG: 253 | Disposition: A | Discharge status: Home / Self Care | Payer: Medicare Other | Attending: Neuroradiology | Admitting: Neuroradiology

## 2022-09-28 ENCOUNTER — Ambulatory Visit (HOSPITAL_COMMUNITY): Payer: Medicare Other | Admitting: Physician Assistant

## 2022-09-28 ENCOUNTER — Inpatient Hospital Stay (HOSPITAL_COMMUNITY)
Admission: RE | Admit: 2022-09-28 | Discharge: 2022-09-28 | Disposition: A | Discharge status: Home / Self Care | Payer: Medicare Other | Source: Ambulatory Visit | Attending: Neuroradiology | Admitting: Neuroradiology

## 2022-09-28 DIAGNOSIS — I2510 Atherosclerotic heart disease of native coronary artery without angina pectoris: Secondary | ICD-10-CM | POA: Diagnosis present

## 2022-09-28 DIAGNOSIS — I5032 Chronic diastolic (congestive) heart failure: Secondary | ICD-10-CM | POA: Diagnosis present

## 2022-09-28 DIAGNOSIS — Z87448 Personal history of other diseases of urinary system: Secondary | ICD-10-CM

## 2022-09-28 DIAGNOSIS — I110 Hypertensive heart disease with heart failure: Secondary | ICD-10-CM | POA: Diagnosis not present

## 2022-09-28 DIAGNOSIS — Z9889 Other specified postprocedural states: Secondary | ICD-10-CM

## 2022-09-28 DIAGNOSIS — Z87891 Personal history of nicotine dependence: Secondary | ICD-10-CM

## 2022-09-28 DIAGNOSIS — I471 Supraventricular tachycardia, unspecified: Secondary | ICD-10-CM | POA: Diagnosis present

## 2022-09-28 DIAGNOSIS — F32A Depression, unspecified: Secondary | ICD-10-CM | POA: Diagnosis present

## 2022-09-28 DIAGNOSIS — H9319 Tinnitus, unspecified ear: Secondary | ICD-10-CM

## 2022-09-28 DIAGNOSIS — Z8673 Personal history of transient ischemic attack (TIA), and cerebral infarction without residual deficits: Secondary | ICD-10-CM | POA: Diagnosis not present

## 2022-09-28 DIAGNOSIS — F419 Anxiety disorder, unspecified: Secondary | ICD-10-CM | POA: Diagnosis present

## 2022-09-28 DIAGNOSIS — I6350 Cerebral infarction due to unspecified occlusion or stenosis of unspecified cerebral artery: Secondary | ICD-10-CM

## 2022-09-28 DIAGNOSIS — Z833 Family history of diabetes mellitus: Secondary | ICD-10-CM

## 2022-09-28 DIAGNOSIS — Z87442 Personal history of urinary calculi: Secondary | ICD-10-CM

## 2022-09-28 DIAGNOSIS — E119 Type 2 diabetes mellitus without complications: Secondary | ICD-10-CM | POA: Diagnosis present

## 2022-09-28 DIAGNOSIS — H93A2 Pulsatile tinnitus, left ear: Secondary | ICD-10-CM | POA: Diagnosis present

## 2022-09-28 DIAGNOSIS — I712 Thoracic aortic aneurysm, without rupture, unspecified: Secondary | ICD-10-CM | POA: Diagnosis present

## 2022-09-28 DIAGNOSIS — H9312 Tinnitus, left ear: Secondary | ICD-10-CM

## 2022-09-28 DIAGNOSIS — I130 Hypertensive heart and chronic kidney disease with heart failure and stage 1 through stage 4 chronic kidney disease, or unspecified chronic kidney disease: Secondary | ICD-10-CM | POA: Diagnosis present

## 2022-09-28 DIAGNOSIS — K219 Gastro-esophageal reflux disease without esophagitis: Secondary | ICD-10-CM | POA: Diagnosis present

## 2022-09-28 DIAGNOSIS — I6523 Occlusion and stenosis of bilateral carotid arteries: Secondary | ICD-10-CM | POA: Diagnosis present

## 2022-09-28 DIAGNOSIS — I672 Cerebral atherosclerosis: Secondary | ICD-10-CM | POA: Diagnosis present

## 2022-09-28 DIAGNOSIS — I868 Varicose veins of other specified sites: Secondary | ICD-10-CM | POA: Diagnosis present

## 2022-09-28 DIAGNOSIS — E785 Hyperlipidemia, unspecified: Secondary | ICD-10-CM | POA: Diagnosis present

## 2022-09-28 DIAGNOSIS — Z8249 Family history of ischemic heart disease and other diseases of the circulatory system: Secondary | ICD-10-CM

## 2022-09-28 DIAGNOSIS — I509 Heart failure, unspecified: Secondary | ICD-10-CM

## 2022-09-28 HISTORY — PX: IR CT HEAD LTD: IMG2386

## 2022-09-28 HISTORY — PX: IR US GUIDE VASC ACCESS RIGHT: IMG2390

## 2022-09-28 HISTORY — PX: IR ANGIOGRAM FOLLOW UP STUDY: IMG697

## 2022-09-28 HISTORY — PX: IR TRANSCATH/EMBOLIZ: IMG695

## 2022-09-28 HISTORY — PX: RADIOLOGY WITH ANESTHESIA: SHX6223

## 2022-09-28 LAB — BASIC METABOLIC PANEL
Anion gap: 10 (ref 5–15)
BUN: 19 mg/dL (ref 8–23)
CO2: 22 mmol/L (ref 22–32)
Calcium: 8.6 mg/dL — ABNORMAL LOW (ref 8.9–10.3)
Chloride: 99 mmol/L (ref 98–111)
Creatinine, Ser: 1.16 mg/dL (ref 0.61–1.24)
GFR, Estimated: >60 mL/min (ref >60)
Glucose, Bld: 211 mg/dL — ABNORMAL HIGH (ref 70–99)
Potassium: 3.9 mmol/L (ref 3.5–5.1)
Sodium: 131 mmol/L — ABNORMAL LOW (ref 135–145)

## 2022-09-28 LAB — PROTIME-INR
INR: 1.1 (ref 0.8–1.2)
Prothrombin Time: 13.9 seconds (ref 11.4–15.2)

## 2022-09-28 LAB — CBC WITH DIFFERENTIAL/PLATELET
Abs Immature Granulocytes: 0 10*3/uL (ref 0.00–0.07)
Basophils Absolute: 0 10*3/uL (ref 0.0–0.1)
Basophils Relative: 1 %
Eosinophils Absolute: 0.3 10*3/uL (ref 0.0–0.5)
Eosinophils Relative: 5 %
HCT: 27.9 % — ABNORMAL LOW (ref 39.0–52.0)
Hemoglobin: 9.1 g/dL — ABNORMAL LOW (ref 13.0–17.0)
Immature Granulocytes: 0 %
Lymphocytes Relative: 28 %
Lymphs Abs: 1.4 10*3/uL (ref 0.7–4.0)
MCH: 28.9 pg (ref 26.0–34.0)
MCHC: 32.6 g/dL (ref 30.0–36.0)
MCV: 88.6 fL (ref 80.0–100.0)
Monocytes Absolute: 0.7 10*3/uL (ref 0.1–1.0)
Monocytes Relative: 15 %
Neutro Abs: 2.6 10*3/uL (ref 1.7–7.7)
Neutrophils Relative %: 51 %
Platelets: 355 10*3/uL (ref 150–400)
RBC: 3.15 MIL/uL — ABNORMAL LOW (ref 4.22–5.81)
RDW: 13.3 % (ref 11.5–15.5)
WBC: 5 10*3/uL (ref 4.0–10.5)
nRBC: 0 % (ref 0.0–0.2)

## 2022-09-28 LAB — POCT ACTIVATED CLOTTING TIME
Activated Clotting Time: 125 seconds
Activated Clotting Time: 155 seconds
Activated Clotting Time: 203 seconds
Activated Clotting Time: 215 seconds

## 2022-09-28 LAB — GLUCOSE, CAPILLARY
Glucose-Capillary: 238 mg/dL — ABNORMAL HIGH (ref 70–99)
Glucose-Capillary: 148 mg/dL — ABNORMAL HIGH (ref 70–99)

## 2022-09-28 SURGERY — IR WITH ANESTHESIA
Anesthesia: General

## 2022-09-28 MED ORDER — ONDANSETRON HCL 4 MG/2ML IJ SOLN: 4.0000 mg | Freq: Four times a day (QID) | INTRAMUSCULAR | Status: DC | PRN

## 2022-09-28 MED ORDER — CEFAZOLIN SODIUM-DEXTROSE 2-4 GM/100ML-% IV SOLN
2.0000 g | INTRAVENOUS | Status: AC
  Administered 2022-09-28: 2 g via INTRAVENOUS
  Filled 2022-09-28 (×2): qty 100

## 2022-09-28 MED ORDER — SODIUM CHLORIDE 0.9 % IV SOLN: INTRAVENOUS | Status: DC

## 2022-09-28 MED ORDER — ACETAMINOPHEN 325 MG PO TABS
650.0000 mg | ORAL_TABLET | ORAL | Status: DC | PRN
  Administered 2022-09-28 – 2022-09-29 (×2): 650 mg via ORAL
  Filled 2022-09-28 (×2): qty 2

## 2022-09-28 MED ORDER — PHENYLEPHRINE HCL-NACL 20-0.9 MG/250ML-% IV SOLN
INTRAVENOUS | Status: DC | PRN
  Administered 2022-09-28: 50 ug/min via INTRAVENOUS

## 2022-09-28 MED ORDER — LABETALOL HCL 5 MG/ML IV SOLN
INTRAVENOUS | Status: DC | PRN
  Administered 2022-09-28 (×3): 10 mg via INTRAVENOUS

## 2022-09-28 MED ORDER — MIDAZOLAM HCL 2 MG/2ML IJ SOLN
INTRAMUSCULAR | Status: DC | PRN
  Administered 2022-09-28: 2 mg via INTRAVENOUS

## 2022-09-28 MED ORDER — INSULIN ASPART 100 UNIT/ML IJ SOLN
0.0000 [IU] | INTRAMUSCULAR | Status: DC | PRN
  Administered 2022-09-28: 6 [IU] via SUBCUTANEOUS
  Filled 2022-09-28: qty 1

## 2022-09-28 MED ORDER — CLOPIDOGREL BISULFATE 75 MG PO TABS
75.0000 mg | ORAL_TABLET | ORAL | Status: AC
  Administered 2022-09-28: 75 mg via ORAL
  Filled 2022-09-28: qty 1

## 2022-09-28 MED ORDER — LACTATED RINGERS IV SOLN: INTRAVENOUS | Status: DC

## 2022-09-28 MED ORDER — CHLORHEXIDINE GLUCONATE CLOTH 2 % EX PADS
6.0000 | MEDICATED_PAD | Freq: Every day | CUTANEOUS | Status: DC
  Administered 2022-09-28 – 2022-09-29 (×2): 6 via TOPICAL

## 2022-09-28 MED ORDER — ACETAMINOPHEN 160 MG/5ML PO SOLN: 650.0000 mg | ORAL | Status: DC | PRN

## 2022-09-28 MED ORDER — CLEVIDIPINE BUTYRATE 0.5 MG/ML IV EMUL
0.0000 mg/h | INTRAVENOUS | Status: DC
  Administered 2022-09-28: 20 mg/h via INTRAVENOUS
  Administered 2022-09-28: 18 mg/h via INTRAVENOUS
  Administered 2022-09-29 (×5): 21 mg/h via INTRAVENOUS
  Filled 2022-09-28 (×7): qty 100

## 2022-09-28 MED ORDER — DEXAMETHASONE SODIUM PHOSPHATE 10 MG/ML IJ SOLN
INTRAMUSCULAR | Status: DC | PRN
  Administered 2022-09-28: 10 mg via INTRAVENOUS

## 2022-09-28 MED ORDER — FENTANYL CITRATE (PF) 100 MCG/2ML IJ SOLN
25.0000 ug | INTRAMUSCULAR | Status: DC | PRN
  Administered 2022-09-28: 50 ug via INTRAVENOUS

## 2022-09-28 MED ORDER — SUGAMMADEX SODIUM 200 MG/2ML IV SOLN
INTRAVENOUS | Status: DC | PRN
  Administered 2022-09-28: 200 mg via INTRAVENOUS

## 2022-09-28 MED ORDER — CLOPIDOGREL BISULFATE 75 MG PO TABS
75.0000 mg | ORAL_TABLET | Freq: Every day | ORAL | Status: DC
  Administered 2022-09-29: 75 mg via ORAL
  Filled 2022-09-28: qty 1

## 2022-09-28 MED ORDER — PROPOFOL 10 MG/ML IV BOLUS
INTRAVENOUS | Status: DC | PRN
  Administered 2022-09-28: 200 mg via INTRAVENOUS

## 2022-09-28 MED ORDER — ORAL CARE MOUTH RINSE: 15.0000 mL | Freq: Once | OROMUCOSAL | Status: AC

## 2022-09-28 MED ORDER — EPHEDRINE SULFATE-NACL 50-0.9 MG/10ML-% IV SOSY
PREFILLED_SYRINGE | INTRAVENOUS | Status: DC | PRN
  Administered 2022-09-28: 10 mg via INTRAVENOUS
  Administered 2022-09-28: 5 mg via INTRAVENOUS
  Administered 2022-09-28: 10 mg via INTRAVENOUS

## 2022-09-28 MED ORDER — CLOPIDOGREL BISULFATE 75 MG PO TABS: 75.0000 mg | ORAL_TABLET | Freq: Every day | ORAL | Status: DC

## 2022-09-28 MED ORDER — HEPARIN SODIUM (PORCINE) 1000 UNIT/ML IJ SOLN
INTRAMUSCULAR | Status: DC | PRN
  Administered 2022-09-28: 1000 [IU] via INTRAVENOUS
  Administered 2022-09-28: 3000 [IU] via INTRAVENOUS
  Administered 2022-09-28: 5000 [IU] via INTRAVENOUS

## 2022-09-28 MED ORDER — ROCURONIUM BROMIDE 10 MG/ML (PF) SYRINGE
PREFILLED_SYRINGE | INTRAVENOUS | Status: DC | PRN
  Administered 2022-09-28: 40 mg via INTRAVENOUS
  Administered 2022-09-28: 50 mg via INTRAVENOUS
  Administered 2022-09-28: 60 mg via INTRAVENOUS

## 2022-09-28 MED ORDER — NIMODIPINE 30 MG PO CAPS
0.0000 mg | ORAL_CAPSULE | ORAL | Status: AC
  Administered 2022-09-28: 30 mg via ORAL
  Filled 2022-09-28: qty 2

## 2022-09-28 MED ORDER — CLEVIDIPINE BUTYRATE 0.5 MG/ML IV EMUL
0.0000 mg/h | INTRAVENOUS | Status: DC
  Administered 2022-09-28: 6 mg/h via INTRAVENOUS
  Administered 2022-09-28: 10 mg/h via INTRAVENOUS
  Administered 2022-09-28: 2 mg/h via INTRAVENOUS
  Administered 2022-09-28: 20 mg/h via INTRAVENOUS
  Filled 2022-09-28 (×4): qty 50

## 2022-09-28 MED ORDER — ASPIRIN 325 MG PO TBEC
325.0000 mg | DELAYED_RELEASE_TABLET | ORAL | Status: DC
  Filled 2022-09-28: qty 1

## 2022-09-28 MED ORDER — OXYCODONE HCL 5 MG PO TABS: 5.0000 mg | ORAL_TABLET | Freq: Once | ORAL | Status: DC | PRN

## 2022-09-28 MED ORDER — LIDOCAINE 2% (20 MG/ML) 5 ML SYRINGE
INTRAMUSCULAR | Status: DC | PRN
  Administered 2022-09-28: 60 mg via INTRAVENOUS

## 2022-09-28 MED ORDER — ASPIRIN 81 MG PO CHEW
81.0000 mg | CHEWABLE_TABLET | Freq: Every day | ORAL | Status: DC
  Administered 2022-09-29: 81 mg via ORAL
  Filled 2022-09-28: qty 1

## 2022-09-28 MED ORDER — CHLORHEXIDINE GLUCONATE 0.12 % MT SOLN
15.0000 mL | Freq: Once | OROMUCOSAL | Status: AC
  Administered 2022-09-28: 15 mL via OROMUCOSAL
  Filled 2022-09-28: qty 15

## 2022-09-28 MED ORDER — ASPIRIN 81 MG PO CHEW: 81.0000 mg | CHEWABLE_TABLET | Freq: Every day | ORAL | Status: DC

## 2022-09-28 MED ORDER — ONDANSETRON HCL 4 MG/2ML IJ SOLN
INTRAMUSCULAR | Status: DC | PRN
  Administered 2022-09-28: 4 mg via INTRAVENOUS

## 2022-09-28 MED ORDER — CLEVIDIPINE BUTYRATE 0.5 MG/ML IV EMUL
INTRAVENOUS | Status: AC
  Filled 2022-09-28: qty 50

## 2022-09-28 MED ORDER — LACTATED RINGERS IV SOLN: INTRAVENOUS | Status: DC | PRN

## 2022-09-28 MED ORDER — OXYCODONE HCL 5 MG/5ML PO SOLN: 5.0000 mg | Freq: Once | ORAL | Status: DC | PRN

## 2022-09-28 MED ORDER — ACETAMINOPHEN 650 MG RE SUPP: 650.0000 mg | RECTAL | Status: DC | PRN

## 2022-09-28 MED ORDER — FENTANYL CITRATE (PF) 250 MCG/5ML IJ SOLN
INTRAMUSCULAR | Status: DC | PRN
  Administered 2022-09-28: 100 ug via INTRAVENOUS

## 2022-09-28 MED ORDER — FENTANYL CITRATE (PF) 100 MCG/2ML IJ SOLN
INTRAMUSCULAR | Status: AC
  Filled 2022-09-28: qty 2

## 2022-09-28 MED ORDER — IOHEXOL 300 MG/ML  SOLN
150.0000 mL | Freq: Once | INTRAMUSCULAR | Status: AC | PRN
  Administered 2022-09-28: 65 mL via INTRA_ARTERIAL

## 2022-09-28 NOTE — Procedures (Addendum)
INTERVENTIONAL NEURORADIOLOGY BRIEF POSTPROCEDURE NOTE  ENDOVASCULAR EMBOLIZATION OF VENOUS DIVERTICULUM    Attending: Dr. Baldemar Lenis   Diagnosis: Left-sided pulsatile tinnitus.    Access site: Right common femoral artery; right common femoral vein.    Access closure: Perclose ProGlide; manual pressure.    Anesthesia: General anesthesia    Medication used: Refer to anesthesia documentation.   Complications: None    Estimated blood loss: Negligible    Specimen: None    Findings: A focal extracranial dilatation of an emissary vein draining the left sigmoid sinus/jugular bulb measuring approximately 1.4 x 1.1 cm was again identified.  Transvenous embolization performed with detachable coils with complete occlusion of the venous pouch.  No evidence of thromboembolic or hemorrhagic complication.    The patient tolerated the procedure well without incident or complication and is in stable condition.

## 2022-09-28 NOTE — Anesthesia Procedure Notes (Signed)
Arterial Line Insertion Start/End11/16/2023 7:50 AM, 09/28/2022 8:00 AM Performed by: Rosiland Oz, CRNA, CRNA  Patient location: Pre-op. Preanesthetic checklist: patient identified, IV checked, site marked, risks and benefits discussed, surgical consent, monitors and equipment checked, pre-op evaluation, timeout performed and anesthesia consent Lidocaine 1% used for infiltration Left, radial was placed Catheter size: 20 G Hand hygiene performed  and maximum sterile barriers used   Attempts: 1 Procedure performed without using ultrasound guided technique. Following insertion, dressing applied and Biopatch. Post procedure assessment: normal and unchanged  Patient tolerated the procedure well with no immediate complications.

## 2022-09-28 NOTE — Sedation Documentation (Signed)
Patient transferred to PACU bay 8. During transport, groin site started bleeding. Pressure held for 15 minutes at puncture site. Groin site redressed. No hematoma or bleeding after pressure held. Groin site redressed with guaze and tegaderm. Distal pulses palpable.

## 2022-09-28 NOTE — Anesthesia Postprocedure Evaluation (Signed)
Anesthesia Post Note  Patient: Matthew Cain  Procedure(s) Performed: Embolization of emissary vein     Patient location during evaluation: PACU Anesthesia Type: General Level of consciousness: awake and alert Pain management: pain level controlled Vital Signs Assessment: post-procedure vital signs reviewed and stable Respiratory status: spontaneous breathing, nonlabored ventilation, respiratory function stable and patient connected to nasal cannula oxygen Cardiovascular status: blood pressure returned to baseline and stable Postop Assessment: no apparent nausea or vomiting Anesthetic complications: no   No notable events documented.  Last Vitals:  Vitals:   09/28/22 1425 09/28/22 1430  BP:    Pulse: 75 74  Resp:  16  Temp:    SpO2: 96% 99%    Last Pain:  Vitals:   09/28/22 1430  TempSrc:   PainSc: 0-No pain                 Zayley Arras S

## 2022-09-28 NOTE — Sedation Documentation (Signed)
Right femoral sheath removed, Perclose closure device deployed.

## 2022-09-28 NOTE — Transfer of Care (Signed)
Immediate Anesthesia Transfer of Care Note  Patient: Matthew Cain  Procedure(s) Performed: Embolization of emissary vein  Patient Location: PACU  Anesthesia Type:General  Level of Consciousness: drowsy and patient cooperative  Airway & Oxygen Therapy: Patient Spontanous Breathing  Post-op Assessment: Report given to RN and Post -op Vital signs reviewed and stable  Post vital signs: Reviewed and stable  Last Vitals:  Vitals Value Taken Time  BP 124/58 09/28/22 1149  Temp    Pulse 68 09/28/22 1151  Resp 22 09/28/22 1151  SpO2 93 % 09/28/22 1151  Vitals shown include unvalidated device data.  Last Pain:  Vitals:   09/28/22 0648  TempSrc:   PainSc: 0-No pain      Patients Stated Pain Goal: 3 (09/27/22 1411)  Complications: No notable events documented.

## 2022-09-28 NOTE — Progress Notes (Signed)
Patient is 66 yo male with pulsatile tinnitus, s/p colil embolization of left dilatated emissary vein with Dr. Tommie Sams today, the procedure occurred w/o complication and patient transferred to NICU for overnight observation.   Patient seen in NICU with Dr. Tommie Sams.  He is laying in bed NAD. Denies HA. Reports that he needs to be really still to hear ringing in his left head.   A/O x3, Speech intact.  R CFA puncture site soft, no appreciable pseudoaneurysm. R DP and PT pulse 1+   Plan overnight observation, plan for d/c tomorrow if stable.  Please call NIR for questions and concerns.    Lynann Bologna Azam Gervasi PA-C 09/28/2022 3:48 PM

## 2022-09-28 NOTE — Anesthesia Procedure Notes (Signed)
Procedure Name: Intubation Date/Time: 09/28/2022 9:07 AM  Performed by: Rosiland Oz, CRNAPre-anesthesia Checklist: Patient identified, Suction available, Emergency Drugs available, Patient being monitored and Timeout performed Patient Re-evaluated:Patient Re-evaluated prior to induction Oxygen Delivery Method: Circle system utilized Preoxygenation: Pre-oxygenation with 100% oxygen Induction Type: IV induction Ventilation: Mask ventilation without difficulty Laryngoscope Size: Miller and 3 Grade View: Grade I Tube type: Oral Tube size: 7.5 mm Number of attempts: 1 Airway Equipment and Method: Stylet Placement Confirmation: ETT inserted through vocal cords under direct vision, positive ETCO2 and breath sounds checked- equal and bilateral Secured at: 21 cm Tube secured with: Tape Dental Injury: Teeth and Oropharynx as per pre-operative assessment

## 2022-09-29 ENCOUNTER — Encounter (HOSPITAL_COMMUNITY): Payer: Self-pay | Admitting: Neuroradiology

## 2022-09-29 MED ORDER — HYDRALAZINE HCL 20 MG/ML IJ SOLN
5.0000 mg | Freq: Once | INTRAMUSCULAR | Status: AC
  Administered 2022-09-29: 5 mg via INTRAVENOUS
  Filled 2022-09-29: qty 1

## 2022-09-29 MED ORDER — CARVEDILOL 12.5 MG PO TABS
25.0000 mg | ORAL_TABLET | Freq: Two times a day (BID) | ORAL | Status: DC
  Administered 2022-09-29: 25 mg via ORAL
  Filled 2022-09-29: qty 2

## 2022-09-29 MED ORDER — INSULIN DETEMIR 100 UNIT/ML ~~LOC~~ SOLN
65.0000 [IU] | Freq: Two times a day (BID) | SUBCUTANEOUS | Status: DC
  Administered 2022-09-29: 65 [IU] via SUBCUTANEOUS
  Filled 2022-09-29 (×2): qty 0.65

## 2022-09-29 MED ORDER — INSULIN ASPART 100 UNIT/ML IJ SOLN: 0.0000 [IU] | Freq: Three times a day (TID) | INTRAMUSCULAR | Status: AC

## 2022-09-29 MED ORDER — GLIPIZIDE 5 MG PO TABS
5.0000 mg | ORAL_TABLET | Freq: Once | ORAL | Status: AC
  Administered 2022-09-29: 5 mg via ORAL
  Filled 2022-09-29: qty 1

## 2022-09-29 MED ORDER — AMLODIPINE BESYLATE 5 MG PO TABS
5.0000 mg | ORAL_TABLET | Freq: Every day | ORAL | Status: DC
  Administered 2022-09-29: 5 mg via ORAL
  Filled 2022-09-29: qty 1

## 2022-09-29 MED ORDER — LISINOPRIL 20 MG PO TABS
40.0000 mg | ORAL_TABLET | Freq: Every day | ORAL | Status: DC
  Administered 2022-09-29: 40 mg via ORAL
  Filled 2022-09-29: qty 2

## 2022-09-29 NOTE — Discharge Instructions (Signed)
Femoral Site Care This sheet gives you information about how to care for yourself after your procedure. Your health care provider may also give you more specific instructions. If you have problems or questions, contact your health care provider. What can I expect after the procedure? After the procedure, it is common to have: Bruising that usually fades within 1-2 weeks. Tenderness at the site. Follow these instructions at home: Wound care Follow instructions from your health care provider about how to take care of your insertion site. Make sure you: Wash your hands with soap and water before you change your bandage (dressing). If soap and water are not available, use hand sanitizer. Change your dressing as directed- pressure dressing removed 24 hours post-procedure (and switch for bandaid), bandaid removed 72 hours post-procedure Do not take baths, swim, or use a hot tub for 7 days post-procedure. You may shower 48 hours after the procedure or as told by your health care provider. Gently wash the site with plain soap and water. Pat the area dry with a clean towel. Do not rub the site. This may cause bleeding. Check your site every day for signs of infection. Check for: Redness, swelling, or pain. Fluid or blood. Warmth. Pus or a bad smell. Activity Do not stoop, bend, or lift anything that is heavier than 10 lb (4.5 kg) for 2 weeks post-procedure. Do not drive self for 2 weeks post-procedure. Contact a health care provider if you have: A fever or chills. You have redness, swelling, or pain around your insertion site. Get help right away if: The catheter insertion area swells very fast. You pass out. You suddenly start to sweat or your skin gets clammy. The catheter insertion area is bleeding, and the bleeding does not stop when you hold steady pressure on the area. The area near or just beyond the catheter insertion site becomes pale, cool, tingly, or numb. These symptoms may  represent a serious problem that is an emergency. Do not wait to see if the symptoms will go away. Get medical help right away. Call your local emergency services (911 in the [place] Do not drive yourself to the hospital.  This information is not intended to replace advice given to you by your health care provider. Make sure you discuss any questions you have with your health care provider. Document Revised: 11/12/2017 Document Reviewed: 11/12/2017 Elsevier Patient Education  2020 [employer]  

## 2022-09-29 NOTE — Progress Notes (Signed)
  Transition of Care [facility]) Screening Note   Patient Details  Name: Matthew Cain Date of Birth: 1956-03-17   Transition of Care [facility]) CM/SW Contact:    Glennon Mac, RN Phone Number: 09/29/2022, 1:54 PM    Transition of Care Department [facility]) has reviewed patient and no TOC needs have been identified at this time. We will continue to monitor patient advancement through interdisciplinary progression rounds. If new patient transition needs arise, please place a TOC consult.  Quintella Baton, RN, BSN  Trauma/Neuro ICU Case Manager [phone]

## 2022-09-29 NOTE — Progress Notes (Signed)
MD gave D/c orders. Will titrate off cleviprex and monitor pt per orders pior to d/c home. Dennie Bible states that his cousin will come to pick him up.

## 2022-09-29 NOTE — Plan of Care (Signed)
D/c home

## 2022-09-29 NOTE — Discharge Summary (Signed)
Patient ID: Matthew Cain MRN: [id number] DOB/AGE: Oct 13, 1956 66 y.o.  Admit date: 09/28/2022 Discharge date: 09/29/2022  Supervising Physician: Matthew Cain  Patient Status: [facility] - In-pt  Admission Diagnoses: Cerebral aneurysm, pulsatile tinnitus  Discharge Diagnoses:  Principal Problem:   Status post coil embolization of cerebral aneurysm Active Problems:   Pulsatile tinnitus of left ear   Discharged Condition: good  Hospital Course: Matthew Cain presented to [facility] as an outpatient on 09/28/22 for planned cerebral angiogram with endovascular embolization of focal dilation of emissary vein. The procedure was performed by Dr Matthew Cain without incident or complication and he was admitted for overnight observation. His overnight course was uneventful and patient reports the tinnitus he was experiencing has resolved. He feels very good, has been tolerating PO intake and is voiding without difficulty. His BP on initial exam remained elevated however after administration of home anti-HTN medications this improved significantly and he was deemed stable for discharge home today.   Reviewed post procedure wound care and follow up with patient today who states understanding. He will return for MRV in 6 months.   Consults:  Anesthesia  Significant Diagnostic Studies: IR Transcath/Emboliz  Result Date: 09/28/2022 INDICATION: Matthew Cain is a 66 year old male with past medical history significant for chronic diastolic heart failure, DM II, CVA, HTN and renal insufficiency. He complains of left sided pulsatile tinnitus that coincides with heartbeat that disrupts his sleep. He initially underwent a diagnostic cerebral angiogram on 06/03/2021. However, images were degraded by motion. 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 This was marked guided by fluoroscopy and patient confirmed it coincides with the location  where he hears the pulsatile tinnitus. He comes to our service today for endovascular embolization of the left jugular bulb diverticulum. EXAM: ULTRASOUND-GUIDED ARTERIAL AND VENOUS ACCESS DIAGNOSTIC CEREBRAL ANGIOGRAM ENDOVASCULAR TRANSCATHETER EMBOLIZATION FLAT PANEL HEAD CT COMPARISON:  Cerebral angiogram September 07, 2022 and June 03, 2021. MEDICATIONS: 2 g of Ancef IV. The antibiotic was administered within 1 hour of the procedure ANESTHESIA/SEDATION: The procedure was performed under general anesthesia. CONTRAST:  65 mL of Omnipaque 300 milligram/mL. FLUOROSCOPY: Radiation Exposure Index (as provided by the fluoroscopic device): 5,329 mGy Kerma COMPLICATIONS: None immediate. TECHNIQUE: Informed written consent was obtained from the patient after a thorough discussion of the procedural risks, benefits and alternatives. All questions were addressed. Maximal Sterile Barrier Technique was utilized including caps, mask, sterile gowns, sterile gloves, sterile drape, hand hygiene and skin antiseptic. A timeout was performed prior to the initiation of the procedure. The right groin was prepped and draped in the usual sterile fashion. Using a micropuncture kit and the modified Seldinger technique, access was gained to the right common femoral artery and a 6 French sheath was placed. Real-time ultrasound guidance was utilized for vascular access including the acquisition of a permanent ultrasound image documenting patency of the accessed vessel. Using a micropuncture kit and the modified Seldinger technique, access was gained to the right common femoral vein and an 8 French sheath was placed. Real-time ultrasound guidance was utilized for vascular access including the acquisition of a permanent ultrasound image documenting patency of the accessed vessel. Under fluoroscopy, a 5 French Simmons 2 glide catheter was navigated over a 0.035" Terumo Glidewire via the right common femoral arterial access into the aortic arch.  The catheter was placed into the left common carotid artery frontal and lateral angiograms of the neck were obtained. Using biplane  roadmap guidance, the catheter was then advanced into the left internal carotid artery. The diagnostic catheter was removed. Frontal and lateral angiograms of the head were obtained. Under fluoroscopy, a Cerebase guide catheter was navigated over a 0.035" Terumo Glidewire via the right common femoral vein access into the superior vena cava. A 6 [place] Berenstein 2 catheter was then navigated through the guide catheter and over a 0.035" Terumo Glidewire into the left brachiocephalic vein. Angiograms were obtained via arterial contrast injection with frontal and lateral views of the neck. Using biplane roadmap guidance, multiple attempts to navigate the catheter into the left internal jugular vein with different wires proved unsuccessful. Using road map guidance, the catheter was then retracted into the superior vena cava and then navigated into the right internal jugular vein. Then, the Berenstein catheter was navigated over the wire into the right jugular bulb. The Cerebase catheter was then navigated over the [place] and the Glidewire into the right sigmoid sinus. Frontal and lateral angiograms of the head for obtained via left internal carotid artery contrast injection. FINDINGS: 1. Normal caliber of the right common femoral artery, adequate for vascular access. 2. Patent right common femoral vein, adequate for vascular access. 3. Left CCA angiograms: Calcified atherosclerotic changes of the left carotid bifurcation without hemodynamically significant stenosis. 4. Left ICA angiograms: Brisk vascular contrast filling of the left ACA and MCA vascular trees. There is also brisk contrast opacification the left PCA vascular tree (fetal PCA). The visualized dural sinuses are patent. However, there is significant drainage anteriorly via sphenoparietal sinus as well as via left occipital  emissary veins. A focal extracranial dilatation of an emissary vein draining the left sigmoid sinus/jugular bulb is again identified. This venous dilatation measures approximately 1.4 x 1.1 cm and drains into the posterior cervical region. PROCEDURE: Magnified cerebral angiograms were obtained via arterial contrast injection with left anterior oblique and Schuller's view of the head. Using biplane roadmap guidance, an Apro 70 intermediate catheter was navigated over a phenom 17 microcatheter and an Aristotle 14 microguidewire into the left sigmoid sinus. The microcatheter was then advanced over the wire into diverticulum from an emissary vein draining the left sigmoid sinus/jugular bulb. The microwire was removed. Endovascular embolization of the venous diverticulum was performed with detachable coils. Control angiograms were performed. Coils implanted: Axium Prime 12 mm x 40 cm, Axium Prime 8 mm x 20 cm, Axium Prime 8 mm x 30 cm, Axium Prime 10 mm x 40 cm, Axium Prime 10 mm x 30 cm, Axium 8 mm x 15 cm, Axium 8 mm x 15 cm, Axium 4 mm x 12 cm, Axium 3 mm x 8 cm and Axium all 3 mm x 8 cm. Control angiograms showed complete occlusion of the venous diverticulum and proximal aspect of the emissary vein without herniation into the venous sinus which remains widely patent. Left internal carotid artery angiograms with frontal and lateral views of the head shows no evidence of thromboembolic complication. Flat panel CT of the head was obtained and post processed in a separate workstation with concurrent attending physician supervision. Selected images were sent to PACS. No evidence of hemorrhagic complication. The venous and arterial catheters were subsequently withdrawn. Right common femoral artery angiogram was obtained in right anterior oblique view. The puncture is at the level of the common femoral artery. The artery has normal caliber, adequate for closure device. The sheath was exchanged over the wire for a Perclose  ProGlide which was utilized for access closure. Immediate hemostasis was achieved.  The right common femoral sheath was removed and manual pressure was held for 15 minutes. Adequate hemostasis was achieved. 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 PLAN: Patient admitted to ICU for overnight observation. Electronically Signed   By: Matthew Cain M.D.   On: 09/28/2022 16:34   IR US Guide Vasc Access Right  Result Date: 09/28/2022 INDICATION: Matthew Cain is a 66 year old male with past medical history significant for chronic diastolic heart failure, DM II, CVA, HTN and renal insufficiency. He complains of left sided pulsatile tinnitus that coincides with heartbeat that disrupts his sleep. He initially underwent a diagnostic cerebral angiogram on 06/03/2021. However, images were degraded by motion. 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 This was marked guided by fluoroscopy and patient confirmed it coincides with the location where he hears the pulsatile tinnitus. He comes to our service today for endovascular embolization of the left jugular bulb diverticulum. EXAM: ULTRASOUND-GUIDED ARTERIAL AND VENOUS ACCESS DIAGNOSTIC CEREBRAL ANGIOGRAM ENDOVASCULAR TRANSCATHETER EMBOLIZATION FLAT PANEL HEAD CT COMPARISON:  Cerebral angiogram September 07, 2022 and June 03, 2021. MEDICATIONS: 2 g of Ancef IV. The antibiotic was administered within 1 hour of the procedure ANESTHESIA/SEDATION: The procedure was performed under general anesthesia. CONTRAST:  65 mL of Omnipaque 300 milligram/mL. FLUOROSCOPY: Radiation Exposure Index (as provided by the fluoroscopic device): 6,283 mGy Kerma COMPLICATIONS: None immediate. TECHNIQUE: Informed written consent was obtained from the patient after a thorough discussion of the procedural risks, benefits and  alternatives. All questions were addressed. Maximal Sterile Barrier Technique was utilized including caps, mask, sterile gowns, sterile gloves, sterile drape, hand hygiene and skin antiseptic. A timeout was performed prior to the initiation of the procedure. The right groin was prepped and draped in the usual sterile fashion. Using a micropuncture kit and the modified Seldinger technique, access was gained to the right common femoral artery and a 6 French sheath was placed. Real-time ultrasound guidance was utilized for vascular access including the acquisition of a permanent ultrasound image documenting patency of the accessed vessel. Using a micropuncture kit and the modified Seldinger technique, access was gained to the right common femoral vein and an 8 French sheath was placed. Real-time ultrasound guidance was utilized for vascular access including the acquisition of a permanent ultrasound image documenting patency of the accessed vessel. Under fluoroscopy, a 5 French Simmons 2 glide catheter was navigated over a 0.035" Terumo Glidewire via the right common femoral arterial access into the aortic arch. The catheter was placed into the left common carotid artery frontal and lateral angiograms of the neck were obtained. Using biplane roadmap guidance, the catheter was then advanced into the left internal carotid artery. The diagnostic catheter was removed. Frontal and lateral angiograms of the head were obtained. Under fluoroscopy, a Cerebase guide catheter was navigated over a 0.035" Terumo Glidewire via the right common femoral vein access into the superior vena cava. A 6 [place] Berenstein 2 catheter was then navigated through the guide catheter and over a 0.035" Terumo Glidewire into the left brachiocephalic vein. Angiograms were obtained via arterial contrast injection with frontal and lateral views of the neck. Using biplane roadmap guidance, multiple attempts to navigate the catheter into the left internal  jugular vein with different wires proved unsuccessful. Using road map guidance, the catheter was then retracted into the superior vena cava and then navigated into the right internal jugular  vein. Then, the Berenstein catheter was navigated over the wire into the right jugular bulb. The Cerebase catheter was then navigated over the [place] and the Glidewire into the right sigmoid sinus. Frontal and lateral angiograms of the head for obtained via left internal carotid artery contrast injection. FINDINGS: 1. Normal caliber of the right common femoral artery, adequate for vascular access. 2. Patent right common femoral vein, adequate for vascular access. 3. Left CCA angiograms: Calcified atherosclerotic changes of the left carotid bifurcation without hemodynamically significant stenosis. 4. Left ICA angiograms: Brisk vascular contrast filling of the left ACA and MCA vascular trees. There is also brisk contrast opacification the left PCA vascular tree (fetal PCA). The visualized dural sinuses are patent. However, there is significant drainage anteriorly via sphenoparietal sinus as well as via left occipital emissary veins. A focal extracranial dilatation of an emissary vein draining the left sigmoid sinus/jugular bulb is again identified. This venous dilatation measures approximately 1.4 x 1.1 cm and drains into the posterior cervical region. PROCEDURE: Magnified cerebral angiograms were obtained via arterial contrast injection with left anterior oblique and Schuller's view of the head. Using biplane roadmap guidance, an Apro 70 intermediate catheter was navigated over a phenom 17 microcatheter and an Aristotle 14 microguidewire into the left sigmoid sinus. The microcatheter was then advanced over the wire into diverticulum from an emissary vein draining the left sigmoid sinus/jugular bulb. The microwire was removed. Endovascular embolization of the venous diverticulum was performed with detachable coils. Control  angiograms were performed. Coils implanted: Axium Prime 12 mm x 40 cm, Axium Prime 8 mm x 20 cm, Axium Prime 8 mm x 30 cm, Axium Prime 10 mm x 40 cm, Axium Prime 10 mm x 30 cm, Axium 8 mm x 15 cm, Axium 8 mm x 15 cm, Axium 4 mm x 12 cm, Axium 3 mm x 8 cm and Axium all 3 mm x 8 cm. Control angiograms showed complete occlusion of the venous diverticulum and proximal aspect of the emissary vein without herniation into the venous sinus which remains widely patent. Left internal carotid artery angiograms with frontal and lateral views of the head shows no evidence of thromboembolic complication. Flat panel CT of the head was obtained and post processed in a separate workstation with concurrent attending physician supervision. Selected images were sent to PACS. No evidence of hemorrhagic complication. The venous and arterial catheters were subsequently withdrawn. Right common femoral artery angiogram was obtained in right anterior oblique view. The puncture is at the level of the common femoral artery. The artery has normal caliber, adequate for closure device. The sheath was exchanged over the wire for a Perclose ProGlide which was utilized for access closure. Immediate hemostasis was achieved. The right common femoral sheath was removed and manual pressure was held for 15 minutes. Adequate hemostasis was achieved. 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 PLAN: Patient admitted to ICU for overnight observation. Electronically Signed   By: Matthew Cain M.D.   On: 09/28/2022 16:34   IR US Guide Vasc Access Right  Result Date: 09/28/2022 INDICATION: Matthew Cain is a 66 year old male with past medical history significant for chronic diastolic heart failure, DM II, CVA, HTN and renal insufficiency. He complains of left sided pulsatile tinnitus that coincides with heartbeat that disrupts his sleep. He  initially underwent a diagnostic cerebral angiogram on 06/03/2021. However, images were degraded by motion. 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 This was marked guided by fluoroscopy and patient confirmed it coincides with the location where he hears the pulsatile tinnitus. He comes to our service today for endovascular embolization of the left jugular bulb diverticulum. EXAM: ULTRASOUND-GUIDED ARTERIAL AND VENOUS ACCESS DIAGNOSTIC CEREBRAL ANGIOGRAM ENDOVASCULAR TRANSCATHETER EMBOLIZATION FLAT PANEL HEAD CT COMPARISON:  Cerebral angiogram September 07, 2022 and June 03, 2021. MEDICATIONS: 2 g of Ancef IV. The antibiotic was administered within 1 hour of the procedure ANESTHESIA/SEDATION: The procedure was performed under general anesthesia. CONTRAST:  65 mL of Omnipaque 300 milligram/mL. FLUOROSCOPY: Radiation Exposure Index (as provided by the fluoroscopic device): 0,626 mGy Kerma COMPLICATIONS: None immediate. TECHNIQUE: Informed written consent was obtained from the patient after a thorough discussion of the procedural risks, benefits and alternatives. All questions were addressed. Maximal Sterile Barrier Technique was utilized including caps, mask, sterile gowns, sterile gloves, sterile drape, hand hygiene and skin antiseptic. A timeout was performed prior to the initiation of the procedure. The right groin was prepped and draped in the usual sterile fashion. Using a micropuncture kit and the modified Seldinger technique, access was gained to the right common femoral artery and a 6 French sheath was placed. Real-time ultrasound guidance was utilized for vascular access including the acquisition of a permanent ultrasound image documenting patency of the accessed vessel. Using a micropuncture kit and the modified Seldinger technique, access was gained to the right common femoral vein and an 8 French sheath was placed. Real-time ultrasound  guidance was utilized for vascular access including the acquisition of a permanent ultrasound image documenting patency of the accessed vessel. Under fluoroscopy, a 5 French Simmons 2 glide catheter was navigated over a 0.035" Terumo Glidewire via the right common femoral arterial access into the aortic arch. The catheter was placed into the left common carotid artery frontal and lateral angiograms of the neck were obtained. Using biplane roadmap guidance, the catheter was then advanced into the left internal carotid artery. The diagnostic catheter was removed. Frontal and lateral angiograms of the head were obtained. Under fluoroscopy, a Cerebase guide catheter was navigated over a 0.035" Terumo Glidewire via the right common femoral vein access into the superior vena cava. A 6 [place] Berenstein 2 catheter was then navigated through the guide catheter and over a 0.035" Terumo Glidewire into the left brachiocephalic vein. Angiograms were obtained via arterial contrast injection with frontal and lateral views of the neck. Using biplane roadmap guidance, multiple attempts to navigate the catheter into the left internal jugular vein with different wires proved unsuccessful. Using road map guidance, the catheter was then retracted into the superior vena cava and then navigated into the right internal jugular vein. Then, the Berenstein catheter was navigated over the wire into the right jugular bulb. The Cerebase catheter was then navigated over the [place] and the Glidewire into the right sigmoid sinus. Frontal and lateral angiograms of the head for obtained via left internal carotid artery contrast injection. FINDINGS: 1. Normal caliber of the right common femoral artery, adequate for vascular access. 2. Patent right common femoral vein, adequate for vascular access. 3. Left CCA angiograms: Calcified atherosclerotic changes of the left carotid bifurcation without hemodynamically significant stenosis. 4. Left ICA  angiograms: Brisk vascular contrast filling of the left ACA and MCA vascular trees. There is also brisk contrast opacification the left PCA vascular tree (fetal PCA). The visualized dural sinuses are patent. However, there is significant drainage anteriorly via sphenoparietal sinus as well  as via left occipital emissary veins. A focal extracranial dilatation of an emissary vein draining the left sigmoid sinus/jugular bulb is again identified. This venous dilatation measures approximately 1.4 x 1.1 cm and drains into the posterior cervical region. PROCEDURE: Magnified cerebral angiograms were obtained via arterial contrast injection with left anterior oblique and Schuller's view of the head. Using biplane roadmap guidance, an Apro 70 intermediate catheter was navigated over a phenom 17 microcatheter and an Aristotle 14 microguidewire into the left sigmoid sinus. The microcatheter was then advanced over the wire into diverticulum from an emissary vein draining the left sigmoid sinus/jugular bulb. The microwire was removed. Endovascular embolization of the venous diverticulum was performed with detachable coils. Control angiograms were performed. Coils implanted: Axium Prime 12 mm x 40 cm, Axium Prime 8 mm x 20 cm, Axium Prime 8 mm x 30 cm, Axium Prime 10 mm x 40 cm, Axium Prime 10 mm x 30 cm, Axium 8 mm x 15 cm, Axium 8 mm x 15 cm, Axium 4 mm x 12 cm, Axium 3 mm x 8 cm and Axium all 3 mm x 8 cm. Control angiograms showed complete occlusion of the venous diverticulum and proximal aspect of the emissary vein without herniation into the venous sinus which remains widely patent. Left internal carotid artery angiograms with frontal and lateral views of the head shows no evidence of thromboembolic complication. Flat panel CT of the head was obtained and post processed in a separate workstation with concurrent attending physician supervision. Selected images were sent to PACS. No evidence of hemorrhagic complication. The  venous and arterial catheters were subsequently withdrawn. Right common femoral artery angiogram was obtained in right anterior oblique view. The puncture is at the level of the common femoral artery. The artery has normal caliber, adequate for closure device. The sheath was exchanged over the wire for a Perclose ProGlide which was utilized for access closure. Immediate hemostasis was achieved. The right common femoral sheath was removed and manual pressure was held for 15 minutes. Adequate hemostasis was achieved. 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 PLAN: Patient admitted to ICU for overnight observation. Electronically Signed   By: Matthew Cain M.D.   On: 09/28/2022 16:34   IR Angiogram Follow Up Study  Result Date: 09/28/2022 INDICATION: Matthew Cain is a 66 year old male with past medical history significant for chronic diastolic heart failure, DM II, CVA, HTN and renal insufficiency. He complains of left sided pulsatile tinnitus that coincides with heartbeat that disrupts his sleep. He initially underwent a diagnostic cerebral angiogram on 06/03/2021. However, images were degraded by motion. 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 This was marked guided by fluoroscopy and patient confirmed it coincides with the location where he hears the pulsatile tinnitus. He comes to our service today for endovascular embolization of the left jugular bulb diverticulum. EXAM: ULTRASOUND-GUIDED ARTERIAL AND VENOUS ACCESS DIAGNOSTIC CEREBRAL ANGIOGRAM ENDOVASCULAR TRANSCATHETER EMBOLIZATION FLAT PANEL HEAD CT COMPARISON:  Cerebral angiogram September 07, 2022 and June 03, 2021. MEDICATIONS: 2 g of Ancef IV. The antibiotic was administered within 1 hour of the procedure ANESTHESIA/SEDATION: The procedure was performed under general  anesthesia. CONTRAST:  65 mL of Omnipaque 300 milligram/mL. FLUOROSCOPY: Radiation Exposure Index (as provided by the fluoroscopic device): 0,865 mGy Kerma COMPLICATIONS: None immediate. TECHNIQUE: Informed written consent was obtained from the patient after a thorough discussion of  the procedural risks, benefits and alternatives. All questions were addressed. Maximal Sterile Barrier Technique was utilized including caps, mask, sterile gowns, sterile gloves, sterile drape, hand hygiene and skin antiseptic. A timeout was performed prior to the initiation of the procedure. The right groin was prepped and draped in the usual sterile fashion. Using a micropuncture kit and the modified Seldinger technique, access was gained to the right common femoral artery and a 6 French sheath was placed. Real-time ultrasound guidance was utilized for vascular access including the acquisition of a permanent ultrasound image documenting patency of the accessed vessel. Using a micropuncture kit and the modified Seldinger technique, access was gained to the right common femoral vein and an 8 French sheath was placed. Real-time ultrasound guidance was utilized for vascular access including the acquisition of a permanent ultrasound image documenting patency of the accessed vessel. Under fluoroscopy, a 5 French Simmons 2 glide catheter was navigated over a 0.035" Terumo Glidewire via the right common femoral arterial access into the aortic arch. The catheter was placed into the left common carotid artery frontal and lateral angiograms of the neck were obtained. Using biplane roadmap guidance, the catheter was then advanced into the left internal carotid artery. The diagnostic catheter was removed. Frontal and lateral angiograms of the head were obtained. Under fluoroscopy, a Cerebase guide catheter was navigated over a 0.035" Terumo Glidewire via the right common femoral vein access into the superior vena cava. A 6 [place] Berenstein 2  catheter was then navigated through the guide catheter and over a 0.035" Terumo Glidewire into the left brachiocephalic vein. Angiograms were obtained via arterial contrast injection with frontal and lateral views of the neck. Using biplane roadmap guidance, multiple attempts to navigate the catheter into the left internal jugular vein with different wires proved unsuccessful. Using road map guidance, the catheter was then retracted into the superior vena cava and then navigated into the right internal jugular vein. Then, the Berenstein catheter was navigated over the wire into the right jugular bulb. The Cerebase catheter was then navigated over the [place] and the Glidewire into the right sigmoid sinus. Frontal and lateral angiograms of the head for obtained via left internal carotid artery contrast injection. FINDINGS: 1. Normal caliber of the right common femoral artery, adequate for vascular access. 2. Patent right common femoral vein, adequate for vascular access. 3. Left CCA angiograms: Calcified atherosclerotic changes of the left carotid bifurcation without hemodynamically significant stenosis. 4. Left ICA angiograms: Brisk vascular contrast filling of the left ACA and MCA vascular trees. There is also brisk contrast opacification the left PCA vascular tree (fetal PCA). The visualized dural sinuses are patent. However, there is significant drainage anteriorly via sphenoparietal sinus as well as via left occipital emissary veins. A focal extracranial dilatation of an emissary vein draining the left sigmoid sinus/jugular bulb is again identified. This venous dilatation measures approximately 1.4 x 1.1 cm and drains into the posterior cervical region. PROCEDURE: Magnified cerebral angiograms were obtained via arterial contrast injection with left anterior oblique and Schuller's view of the head. Using biplane roadmap guidance, an Apro 70 intermediate catheter was navigated over a phenom 17 microcatheter and  an Aristotle 14 microguidewire into the left sigmoid sinus. The microcatheter was then advanced over the wire into diverticulum from an emissary vein draining the left sigmoid sinus/jugular bulb. The microwire was removed. Endovascular embolization of the venous diverticulum was performed with detachable coils. Control angiograms were performed. Coils implanted: Axium Prime 12 mm x 40 cm, Axium Prime  8 mm x 20 cm, Axium Prime 8 mm x 30 cm, Axium Prime 10 mm x 40 cm, Axium Prime 10 mm x 30 cm, Axium 8 mm x 15 cm, Axium 8 mm x 15 cm, Axium 4 mm x 12 cm, Axium 3 mm x 8 cm and Axium all 3 mm x 8 cm. Control angiograms showed complete occlusion of the venous diverticulum and proximal aspect of the emissary vein without herniation into the venous sinus which remains widely patent. Left internal carotid artery angiograms with frontal and lateral views of the head shows no evidence of thromboembolic complication. Flat panel CT of the head was obtained and post processed in a separate workstation with concurrent attending physician supervision. Selected images were sent to PACS. No evidence of hemorrhagic complication. The venous and arterial catheters were subsequently withdrawn. Right common femoral artery angiogram was obtained in right anterior oblique view. The puncture is at the level of the common femoral artery. The artery has normal caliber, adequate for closure device. The sheath was exchanged over the wire for a Perclose ProGlide which was utilized for access closure. Immediate hemostasis was achieved. The right common femoral sheath was removed and manual pressure was held for 15 minutes. Adequate hemostasis was achieved. 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 PLAN: Patient admitted to ICU for overnight observation. Electronically Signed   By: Matthew Cain M.D.   On: 09/28/2022 16:34    IR Angiogram Follow Up Study  Result Date: 09/28/2022 INDICATION: Matthew Cain is a 66 year old male with past medical history significant for chronic diastolic heart failure, DM II, CVA, HTN and renal insufficiency. He complains of left sided pulsatile tinnitus that coincides with heartbeat that disrupts his sleep. He initially underwent a diagnostic cerebral angiogram on 06/03/2021. However, images were degraded by motion. 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 This was marked guided by fluoroscopy and patient confirmed it coincides with the location where he hears the pulsatile tinnitus. He comes to our service today for endovascular embolization of the left jugular bulb diverticulum. EXAM: ULTRASOUND-GUIDED ARTERIAL AND VENOUS ACCESS DIAGNOSTIC CEREBRAL ANGIOGRAM ENDOVASCULAR TRANSCATHETER EMBOLIZATION FLAT PANEL HEAD CT COMPARISON:  Cerebral angiogram September 07, 2022 and June 03, 2021. MEDICATIONS: 2 g of Ancef IV. The antibiotic was administered within 1 hour of the procedure ANESTHESIA/SEDATION: The procedure was performed under general anesthesia. CONTRAST:  65 mL of Omnipaque 300 milligram/mL. FLUOROSCOPY: Radiation Exposure Index (as provided by the fluoroscopic device): 3,235 mGy Kerma COMPLICATIONS: None immediate. TECHNIQUE: Informed written consent was obtained from the patient after a thorough discussion of the procedural risks, benefits and alternatives. All questions were addressed. Maximal Sterile Barrier Technique was utilized including caps, mask, sterile gowns, sterile gloves, sterile drape, hand hygiene and skin antiseptic. A timeout was performed prior to the initiation of the procedure. The right groin was prepped and draped in the usual sterile fashion. Using a micropuncture kit and the modified Seldinger technique, access was gained to the right common femoral artery and a 6 French sheath was placed. Real-time  ultrasound guidance was utilized for vascular access including the acquisition of a permanent ultrasound image documenting patency of the accessed vessel. Using a micropuncture kit and the modified Seldinger technique, access was gained to the right common femoral vein and an 8 French sheath was placed. Real-time ultrasound guidance was utilized for vascular access including the  acquisition of a permanent ultrasound image documenting patency of the accessed vessel. Under fluoroscopy, a 5 French Simmons 2 glide catheter was navigated over a 0.035" Terumo Glidewire via the right common femoral arterial access into the aortic arch. The catheter was placed into the left common carotid artery frontal and lateral angiograms of the neck were obtained. Using biplane roadmap guidance, the catheter was then advanced into the left internal carotid artery. The diagnostic catheter was removed. Frontal and lateral angiograms of the head were obtained. Under fluoroscopy, a Cerebase guide catheter was navigated over a 0.035" Terumo Glidewire via the right common femoral vein access into the superior vena cava. A 6 [place] Berenstein 2 catheter was then navigated through the guide catheter and over a 0.035" Terumo Glidewire into the left brachiocephalic vein. Angiograms were obtained via arterial contrast injection with frontal and lateral views of the neck. Using biplane roadmap guidance, multiple attempts to navigate the catheter into the left internal jugular vein with different wires proved unsuccessful. Using road map guidance, the catheter was then retracted into the superior vena cava and then navigated into the right internal jugular vein. Then, the Berenstein catheter was navigated over the wire into the right jugular bulb. The Cerebase catheter was then navigated over the [place] and the Glidewire into the right sigmoid sinus. Frontal and lateral angiograms of the head for obtained via left internal carotid artery  contrast injection. FINDINGS: 1. Normal caliber of the right common femoral artery, adequate for vascular access. 2. Patent right common femoral vein, adequate for vascular access. 3. Left CCA angiograms: Calcified atherosclerotic changes of the left carotid bifurcation without hemodynamically significant stenosis. 4. Left ICA angiograms: Brisk vascular contrast filling of the left ACA and MCA vascular trees. There is also brisk contrast opacification the left PCA vascular tree (fetal PCA). The visualized dural sinuses are patent. However, there is significant drainage anteriorly via sphenoparietal sinus as well as via left occipital emissary veins. A focal extracranial dilatation of an emissary vein draining the left sigmoid sinus/jugular bulb is again identified. This venous dilatation measures approximately 1.4 x 1.1 cm and drains into the posterior cervical region. PROCEDURE: Magnified cerebral angiograms were obtained via arterial contrast injection with left anterior oblique and Schuller's view of the head. Using biplane roadmap guidance, an Apro 70 intermediate catheter was navigated over a phenom 17 microcatheter and an Aristotle 14 microguidewire into the left sigmoid sinus. The microcatheter was then advanced over the wire into diverticulum from an emissary vein draining the left sigmoid sinus/jugular bulb. The microwire was removed. Endovascular embolization of the venous diverticulum was performed with detachable coils. Control angiograms were performed. Coils implanted: Axium Prime 12 mm x 40 cm, Axium Prime 8 mm x 20 cm, Axium Prime 8 mm x 30 cm, Axium Prime 10 mm x 40 cm, Axium Prime 10 mm x 30 cm, Axium 8 mm x 15 cm, Axium 8 mm x 15 cm, Axium 4 mm x 12 cm, Axium 3 mm x 8 cm and Axium all 3 mm x 8 cm. Control angiograms showed complete occlusion of the venous diverticulum and proximal aspect of the emissary vein without herniation into the venous sinus which remains widely patent. Left internal  carotid artery angiograms with frontal and lateral views of the head shows no evidence of thromboembolic complication. Flat panel CT of the head was obtained and post processed in a separate workstation with concurrent attending physician supervision. Selected images were sent to PACS. No evidence of hemorrhagic complication. The  venous and arterial catheters were subsequently withdrawn. Right common femoral artery angiogram was obtained in right anterior oblique view. The puncture is at the level of the common femoral artery. The artery has normal caliber, adequate for closure device. The sheath was exchanged over the wire for a Perclose ProGlide which was utilized for access closure. Immediate hemostasis was achieved. The right common femoral sheath was removed and manual pressure was held for 15 minutes. Adequate hemostasis was achieved. 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 PLAN: Patient admitted to ICU for overnight observation. Electronically Signed   By: Matthew Cain M.D.   On: 09/28/2022 16:34   IR Angiogram Follow Up Study  Result Date: 09/28/2022 INDICATION: Matthew Cain is a 66 year old male with past medical history significant for chronic diastolic heart failure, DM II, CVA, HTN and renal insufficiency. He complains of left sided pulsatile tinnitus that coincides with heartbeat that disrupts his sleep. He initially underwent a diagnostic cerebral angiogram on 06/03/2021. However, images were degraded by motion. 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 This was marked guided by fluoroscopy and patient confirmed it coincides with the location where he hears the pulsatile tinnitus. He comes to our service today for endovascular embolization of the left jugular bulb diverticulum. EXAM: ULTRASOUND-GUIDED ARTERIAL AND  VENOUS ACCESS DIAGNOSTIC CEREBRAL ANGIOGRAM ENDOVASCULAR TRANSCATHETER EMBOLIZATION FLAT PANEL HEAD CT COMPARISON:  Cerebral angiogram September 07, 2022 and June 03, 2021. MEDICATIONS: 2 g of Ancef IV. The antibiotic was administered within 1 hour of the procedure ANESTHESIA/SEDATION: The procedure was performed under general anesthesia. CONTRAST:  65 mL of Omnipaque 300 milligram/mL. FLUOROSCOPY: Radiation Exposure Index (as provided by the fluoroscopic device): 5,400 mGy Kerma COMPLICATIONS: None immediate. TECHNIQUE: Informed written consent was obtained from the patient after a thorough discussion of the procedural risks, benefits and alternatives. All questions were addressed. Maximal Sterile Barrier Technique was utilized including caps, mask, sterile gowns, sterile gloves, sterile drape, hand hygiene and skin antiseptic. A timeout was performed prior to the initiation of the procedure. The right groin was prepped and draped in the usual sterile fashion. Using a micropuncture kit and the modified Seldinger technique, access was gained to the right common femoral artery and a 6 French sheath was placed. Real-time ultrasound guidance was utilized for vascular access including the acquisition of a permanent ultrasound image documenting patency of the accessed vessel. Using a micropuncture kit and the modified Seldinger technique, access was gained to the right common femoral vein and an 8 French sheath was placed. Real-time ultrasound guidance was utilized for vascular access including the acquisition of a permanent ultrasound image documenting patency of the accessed vessel. Under fluoroscopy, a 5 French Simmons 2 glide catheter was navigated over a 0.035" Terumo Glidewire via the right common femoral arterial access into the aortic arch. The catheter was placed into the left common carotid artery frontal and lateral angiograms of the neck were obtained. Using biplane roadmap guidance, the catheter was then  advanced into the left internal carotid artery. The diagnostic catheter was removed. Frontal and lateral angiograms of the head were obtained. Under fluoroscopy, a Cerebase guide catheter was navigated over a 0.035" Terumo Glidewire via the right common femoral vein access into the superior vena cava. A 6 [place] Berenstein 2 catheter was then navigated through the guide catheter and over a 0.035" Terumo Glidewire into the left brachiocephalic  vein. Angiograms were obtained via arterial contrast injection with frontal and lateral views of the neck. Using biplane roadmap guidance, multiple attempts to navigate the catheter into the left internal jugular vein with different wires proved unsuccessful. Using road map guidance, the catheter was then retracted into the superior vena cava and then navigated into the right internal jugular vein. Then, the Berenstein catheter was navigated over the wire into the right jugular bulb. The Cerebase catheter was then navigated over the [place] and the Glidewire into the right sigmoid sinus. Frontal and lateral angiograms of the head for obtained via left internal carotid artery contrast injection. FINDINGS: 1. Normal caliber of the right common femoral artery, adequate for vascular access. 2. Patent right common femoral vein, adequate for vascular access. 3. Left CCA angiograms: Calcified atherosclerotic changes of the left carotid bifurcation without hemodynamically significant stenosis. 4. Left ICA angiograms: Brisk vascular contrast filling of the left ACA and MCA vascular trees. There is also brisk contrast opacification the left PCA vascular tree (fetal PCA). The visualized dural sinuses are patent. However, there is significant drainage anteriorly via sphenoparietal sinus as well as via left occipital emissary veins. A focal extracranial dilatation of an emissary vein draining the left sigmoid sinus/jugular bulb is again identified. This venous dilatation measures  approximately 1.4 x 1.1 cm and drains into the posterior cervical region. PROCEDURE: Magnified cerebral angiograms were obtained via arterial contrast injection with left anterior oblique and Schuller's view of the head. Using biplane roadmap guidance, an Apro 70 intermediate catheter was navigated over a phenom 17 microcatheter and an Aristotle 14 microguidewire into the left sigmoid sinus. The microcatheter was then advanced over the wire into diverticulum from an emissary vein draining the left sigmoid sinus/jugular bulb. The microwire was removed. Endovascular embolization of the venous diverticulum was performed with detachable coils. Control angiograms were performed. Coils implanted: Axium Prime 12 mm x 40 cm, Axium Prime 8 mm x 20 cm, Axium Prime 8 mm x 30 cm, Axium Prime 10 mm x 40 cm, Axium Prime 10 mm x 30 cm, Axium 8 mm x 15 cm, Axium 8 mm x 15 cm, Axium 4 mm x 12 cm, Axium 3 mm x 8 cm and Axium all 3 mm x 8 cm. Control angiograms showed complete occlusion of the venous diverticulum and proximal aspect of the emissary vein without herniation into the venous sinus which remains widely patent. Left internal carotid artery angiograms with frontal and lateral views of the head shows no evidence of thromboembolic complication. Flat panel CT of the head was obtained and post processed in a separate workstation with concurrent attending physician supervision. Selected images were sent to PACS. No evidence of hemorrhagic complication. The venous and arterial catheters were subsequently withdrawn. Right common femoral artery angiogram was obtained in right anterior oblique view. The puncture is at the level of the common femoral artery. The artery has normal caliber, adequate for closure device. The sheath was exchanged over the wire for a Perclose ProGlide which was utilized for access closure. Immediate hemostasis was achieved. The right common femoral sheath was removed and manual pressure was held for 15  minutes. Adequate hemostasis was achieved. 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 PLAN: Patient admitted to ICU for overnight observation. Electronically Signed   By: Matthew Cain M.D.   On: 09/28/2022 16:34   IR Angiogram Follow Up Study  Result Date: 09/28/2022 INDICATION: Matthew Cain  is a 66 year old male with past medical history significant for chronic diastolic heart failure, DM II, CVA, HTN and renal insufficiency. He complains of left sided pulsatile tinnitus that coincides with heartbeat that disrupts his sleep. He initially underwent a diagnostic cerebral angiogram on 06/03/2021. However, images were degraded by motion. 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 This was marked guided by fluoroscopy and patient confirmed it coincides with the location where he hears the pulsatile tinnitus. He comes to our service today for endovascular embolization of the left jugular bulb diverticulum. EXAM: ULTRASOUND-GUIDED ARTERIAL AND VENOUS ACCESS DIAGNOSTIC CEREBRAL ANGIOGRAM ENDOVASCULAR TRANSCATHETER EMBOLIZATION FLAT PANEL HEAD CT COMPARISON:  Cerebral angiogram September 07, 2022 and June 03, 2021. MEDICATIONS: 2 g of Ancef IV. The antibiotic was administered within 1 hour of the procedure ANESTHESIA/SEDATION: The procedure was performed under general anesthesia. CONTRAST:  65 mL of Omnipaque 300 milligram/mL. FLUOROSCOPY: Radiation Exposure Index (as provided by the fluoroscopic device): 3,086 mGy Kerma COMPLICATIONS: None immediate. TECHNIQUE: Informed written consent was obtained from the patient after a thorough discussion of the procedural risks, benefits and alternatives. All questions were addressed. Maximal Sterile Barrier Technique was utilized including caps, mask, sterile gowns, sterile gloves, sterile drape, hand  hygiene and skin antiseptic. A timeout was performed prior to the initiation of the procedure. The right groin was prepped and draped in the usual sterile fashion. Using a micropuncture kit and the modified Seldinger technique, access was gained to the right common femoral artery and a 6 French sheath was placed. Real-time ultrasound guidance was utilized for vascular access including the acquisition of a permanent ultrasound image documenting patency of the accessed vessel. Using a micropuncture kit and the modified Seldinger technique, access was gained to the right common femoral vein and an 8 French sheath was placed. Real-time ultrasound guidance was utilized for vascular access including the acquisition of a permanent ultrasound image documenting patency of the accessed vessel. Under fluoroscopy, a 5 [place] Simmons 2 glide catheter was navigated over a 0.035" Terumo Glidewire via the right common femoral arterial access into the aortic arch. The catheter was placed into the left common carotid artery frontal and lateral angiograms of the neck were obtained. Using biplane roadmap guidance, the catheter was then advanced into the left internal carotid artery. The diagnostic catheter was removed. Frontal and lateral angiograms of the head were obtained. Under fluoroscopy, a Cerebase guide catheter was navigated over a 0.035" Terumo Glidewire via the right common femoral vein access into the superior vena cava. A 6 [place] Berenstein 2 catheter was then navigated through the guide catheter and over a 0.035" Terumo Glidewire into the left brachiocephalic vein. Angiograms were obtained via arterial contrast injection with frontal and lateral views of the neck. Using biplane roadmap guidance, multiple attempts to navigate the catheter into the left internal jugular vein with different wires proved unsuccessful. Using road map guidance, the catheter was then retracted into the superior vena cava and then navigated into  the right internal jugular vein. Then, the Berenstein catheter was navigated over the wire into the right jugular bulb. The Cerebase catheter was then navigated over the [place] and the Glidewire into the right sigmoid sinus. Frontal and lateral angiograms of the head for obtained via left internal carotid artery contrast injection. FINDINGS: 1. Normal caliber of the right common femoral artery, adequate for vascular access. 2. Patent right common femoral vein, adequate for vascular access. 3. Left CCA angiograms:  Calcified atherosclerotic changes of the left carotid bifurcation without hemodynamically significant stenosis. 4. Left ICA angiograms: Brisk vascular contrast filling of the left ACA and MCA vascular trees. There is also brisk contrast opacification the left PCA vascular tree (fetal PCA). The visualized dural sinuses are patent. However, there is significant drainage anteriorly via sphenoparietal sinus as well as via left occipital emissary veins. A focal extracranial dilatation of an emissary vein draining the left sigmoid sinus/jugular bulb is again identified. This venous dilatation measures approximately 1.4 x 1.1 cm and drains into the posterior cervical region. PROCEDURE: Magnified cerebral angiograms were obtained via arterial contrast injection with left anterior oblique and Schuller's view of the head. Using biplane roadmap guidance, an Apro 70 intermediate catheter was navigated over a phenom 17 microcatheter and an Aristotle 14 microguidewire into the left sigmoid sinus. The microcatheter was then advanced over the wire into diverticulum from an emissary vein draining the left sigmoid sinus/jugular bulb. The microwire was removed. Endovascular embolization of the venous diverticulum was performed with detachable coils. Control angiograms were performed. Coils implanted: Axium Prime 12 mm x 40 cm, Axium Prime 8 mm x 20 cm, Axium Prime 8 mm x 30 cm, Axium Prime 10 mm x 40 cm, Axium Prime 10 mm  x 30 cm, Axium 8 mm x 15 cm, Axium 8 mm x 15 cm, Axium 4 mm x 12 cm, Axium 3 mm x 8 cm and Axium all 3 mm x 8 cm. Control angiograms showed complete occlusion of the venous diverticulum and proximal aspect of the emissary vein without herniation into the venous sinus which remains widely patent. Left internal carotid artery angiograms with frontal and lateral views of the head shows no evidence of thromboembolic complication. Flat panel CT of the head was obtained and post processed in a separate workstation with concurrent attending physician supervision. Selected images were sent to PACS. No evidence of hemorrhagic complication. The venous and arterial catheters were subsequently withdrawn. Right common femoral artery angiogram was obtained in right anterior oblique view. The puncture is at the level of the common femoral artery. The artery has normal caliber, adequate for closure device. The sheath was exchanged over the wire for a Perclose ProGlide which was utilized for access closure. Immediate hemostasis was achieved. The right common femoral sheath was removed and manual pressure was held for 15 minutes. Adequate hemostasis was achieved. 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 PLAN: Patient admitted to ICU for overnight observation. Electronically Signed   By: Matthew Cain M.D.   On: 09/28/2022 16:34   IR Angiogram Follow Up Study  Result Date: 09/28/2022 INDICATION: Matthew Cain is a 66 year old male with past medical history significant for chronic diastolic heart failure, DM II, CVA, HTN and renal insufficiency. He complains of left sided pulsatile tinnitus that coincides with heartbeat that disrupts his sleep. He initially underwent a diagnostic cerebral angiogram on 06/03/2021. However, images were degraded by motion. Repeat angiogram on 09/08/2022 showed a diverticulum from an  emissary vein draining the left jugular bulb. This was marked guided by fluoroscopy and patient confirmed it coincides with the location where he hears the pulsatile tinnitus. He comes to our service today for endovascular embolization of the left jugular bulb diverticulum. EXAM: ULTRASOUND-GUIDED ARTERIAL AND VENOUS ACCESS DIAGNOSTIC CEREBRAL ANGIOGRAM ENDOVASCULAR TRANSCATHETER EMBOLIZATION FLAT PANEL HEAD CT COMPARISON:  Cerebral angiogram September 07, 2022 and June 03, 2021. MEDICATIONS: 2 g of  Ancef IV. The antibiotic was administered within 1 hour of the procedure ANESTHESIA/SEDATION: The procedure was performed under general anesthesia. CONTRAST:  65 mL of Omnipaque 300 milligram/mL. FLUOROSCOPY: Radiation Exposure Index (as provided by the fluoroscopic device): 8,850 mGy Kerma COMPLICATIONS: None immediate. TECHNIQUE: Informed written consent was obtained from the patient after a thorough discussion of the procedural risks, benefits and alternatives. All questions were addressed. Maximal Sterile Barrier Technique was utilized including caps, mask, sterile gowns, sterile gloves, sterile drape, hand hygiene and skin antiseptic. A timeout was performed prior to the initiation of the procedure. The right groin was prepped and draped in the usual sterile fashion. Using a micropuncture kit and the modified Seldinger technique, access was gained to the right common femoral artery and a 6 French sheath was placed. Real-time ultrasound guidance was utilized for vascular access including the acquisition of a permanent ultrasound image documenting patency of the accessed vessel. Using a micropuncture kit and the modified Seldinger technique, access was gained to the right common femoral vein and an 8 French sheath was placed. Real-time ultrasound guidance was utilized for vascular access including the acquisition of a permanent ultrasound image documenting patency of the accessed vessel. Under fluoroscopy, a 5 French  Simmons 2 glide catheter was navigated over a 0.035" Terumo Glidewire via the right common femoral arterial access into the aortic arch. The catheter was placed into the left common carotid artery frontal and lateral angiograms of the neck were obtained. Using biplane roadmap guidance, the catheter was then advanced into the left internal carotid artery. The diagnostic catheter was removed. Frontal and lateral angiograms of the head were obtained. Under fluoroscopy, a Cerebase guide catheter was navigated over a 0.035" Terumo Glidewire via the right common femoral vein access into the superior vena cava. A 6 [place] Berenstein 2 catheter was then navigated through the guide catheter and over a 0.035" Terumo Glidewire into the left brachiocephalic vein. Angiograms were obtained via arterial contrast injection with frontal and lateral views of the neck. Using biplane roadmap guidance, multiple attempts to navigate the catheter into the left internal jugular vein with different wires proved unsuccessful. Using road map guidance, the catheter was then retracted into the superior vena cava and then navigated into the right internal jugular vein. Then, the Berenstein catheter was navigated over the wire into the right jugular bulb. The Cerebase catheter was then navigated over the [place] and the Glidewire into the right sigmoid sinus. Frontal and lateral angiograms of the head for obtained via left internal carotid artery contrast injection. FINDINGS: 1. Normal caliber of the right common femoral artery, adequate for vascular access. 2. Patent right common femoral vein, adequate for vascular access. 3. Left CCA angiograms: Calcified atherosclerotic changes of the left carotid bifurcation without hemodynamically significant stenosis. 4. Left ICA angiograms: Brisk vascular contrast filling of the left ACA and MCA vascular trees. There is also brisk contrast opacification the left PCA vascular tree (fetal PCA). The  visualized dural sinuses are patent. However, there is significant drainage anteriorly via sphenoparietal sinus as well as via left occipital emissary veins. A focal extracranial dilatation of an emissary vein draining the left sigmoid sinus/jugular bulb is again identified. This venous dilatation measures approximately 1.4 x 1.1 cm and drains into the posterior cervical region. PROCEDURE: Magnified cerebral angiograms were obtained via arterial contrast injection with left anterior oblique and Schuller's view of the head. Using biplane roadmap guidance, an Apro 70 intermediate catheter was navigated over a phenom 17 microcatheter and an  Aristotle 14 microguidewire into the left sigmoid sinus. The microcatheter was then advanced over the wire into diverticulum from an emissary vein draining the left sigmoid sinus/jugular bulb. The microwire was removed. Endovascular embolization of the venous diverticulum was performed with detachable coils. Control angiograms were performed. Coils implanted: Axium Prime 12 mm x 40 cm, Axium Prime 8 mm x 20 cm, Axium Prime 8 mm x 30 cm, Axium Prime 10 mm x 40 cm, Axium Prime 10 mm x 30 cm, Axium 8 mm x 15 cm, Axium 8 mm x 15 cm, Axium 4 mm x 12 cm, Axium 3 mm x 8 cm and Axium all 3 mm x 8 cm. Control angiograms showed complete occlusion of the venous diverticulum and proximal aspect of the emissary vein without herniation into the venous sinus which remains widely patent. Left internal carotid artery angiograms with frontal and lateral views of the head shows no evidence of thromboembolic complication. Flat panel CT of the head was obtained and post processed in a separate workstation with concurrent attending physician supervision. Selected images were sent to PACS. No evidence of hemorrhagic complication. The venous and arterial catheters were subsequently withdrawn. Right common femoral artery angiogram was obtained in right anterior oblique view. The puncture is at the level of  the common femoral artery. The artery has normal caliber, adequate for closure device. The sheath was exchanged over the wire for a Perclose ProGlide which was utilized for access closure. Immediate hemostasis was achieved. The right common femoral sheath was removed and manual pressure was held for 15 minutes. Adequate hemostasis was achieved. 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 PLAN: Patient admitted to ICU for overnight observation. Electronically Signed   By: Matthew Cain M.D.   On: 09/28/2022 16:34   IR Angiogram Follow Up Study  Result Date: 09/28/2022 INDICATION: Matthew Cain is a 66 year old male with past medical history significant for chronic diastolic heart failure, DM II, CVA, HTN and renal insufficiency. He complains of left sided pulsatile tinnitus that coincides with heartbeat that disrupts his sleep. He initially underwent a diagnostic cerebral angiogram on 06/03/2021. However, images were degraded by motion. 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 This was marked guided by fluoroscopy and patient confirmed it coincides with the location where he hears the pulsatile tinnitus. He comes to our service today for endovascular embolization of the left jugular bulb diverticulum. EXAM: ULTRASOUND-GUIDED ARTERIAL AND VENOUS ACCESS DIAGNOSTIC CEREBRAL ANGIOGRAM ENDOVASCULAR TRANSCATHETER EMBOLIZATION FLAT PANEL HEAD CT COMPARISON:  Cerebral angiogram September 07, 2022 and June 03, 2021. MEDICATIONS: 2 g of Ancef IV. The antibiotic was administered within 1 hour of the procedure ANESTHESIA/SEDATION: The procedure was performed under general anesthesia. CONTRAST:  65 mL of Omnipaque 300 milligram/mL. FLUOROSCOPY: Radiation Exposure Index (as provided by the fluoroscopic device): 4,098 mGy Kerma COMPLICATIONS: None  immediate. TECHNIQUE: Informed written consent was obtained from the patient after a thorough discussion of the procedural risks, benefits and alternatives. All questions were addressed. Maximal Sterile Barrier Technique was utilized including caps, mask, sterile gowns, sterile gloves, sterile drape, hand hygiene and skin antiseptic. A timeout was performed prior to the initiation of the procedure. The right groin was prepped and draped in the usual sterile fashion. Using a micropuncture kit and the modified Seldinger technique, access was gained to the right common femoral artery and a 6 French sheath was  placed. Real-time ultrasound guidance was utilized for vascular access including the acquisition of a permanent ultrasound image documenting patency of the accessed vessel. Using a micropuncture kit and the modified Seldinger technique, access was gained to the right common femoral vein and an 8 French sheath was placed. Real-time ultrasound guidance was utilized for vascular access including the acquisition of a permanent ultrasound image documenting patency of the accessed vessel. Under fluoroscopy, a 5 French Simmons 2 glide catheter was navigated over a 0.035" Terumo Glidewire via the right common femoral arterial access into the aortic arch. The catheter was placed into the left common carotid artery frontal and lateral angiograms of the neck were obtained. Using biplane roadmap guidance, the catheter was then advanced into the left internal carotid artery. The diagnostic catheter was removed. Frontal and lateral angiograms of the head were obtained. Under fluoroscopy, a Cerebase guide catheter was navigated over a 0.035" Terumo Glidewire via the right common femoral vein access into the superior vena cava. A 6 [place] Berenstein 2 catheter was then navigated through the guide catheter and over a 0.035" Terumo Glidewire into the left brachiocephalic vein. Angiograms were obtained via arterial contrast  injection with frontal and lateral views of the neck. Using biplane roadmap guidance, multiple attempts to navigate the catheter into the left internal jugular vein with different wires proved unsuccessful. Using road map guidance, the catheter was then retracted into the superior vena cava and then navigated into the right internal jugular vein. Then, the Berenstein catheter was navigated over the wire into the right jugular bulb. The Cerebase catheter was then navigated over the [place] and the Glidewire into the right sigmoid sinus. Frontal and lateral angiograms of the head for obtained via left internal carotid artery contrast injection. FINDINGS: 1. Normal caliber of the right common femoral artery, adequate for vascular access. 2. Patent right common femoral vein, adequate for vascular access. 3. Left CCA angiograms: Calcified atherosclerotic changes of the left carotid bifurcation without hemodynamically significant stenosis. 4. Left ICA angiograms: Brisk vascular contrast filling of the left ACA and MCA vascular trees. There is also brisk contrast opacification the left PCA vascular tree (fetal PCA). The visualized dural sinuses are patent. However, there is significant drainage anteriorly via sphenoparietal sinus as well as via left occipital emissary veins. A focal extracranial dilatation of an emissary vein draining the left sigmoid sinus/jugular bulb is again identified. This venous dilatation measures approximately 1.4 x 1.1 cm and drains into the posterior cervical region. PROCEDURE: Magnified cerebral angiograms were obtained via arterial contrast injection with left anterior oblique and Schuller's view of the head. Using biplane roadmap guidance, an Apro 70 intermediate catheter was navigated over a phenom 17 microcatheter and an Aristotle 14 microguidewire into the left sigmoid sinus. The microcatheter was then advanced over the wire into diverticulum from an emissary vein draining the left  sigmoid sinus/jugular bulb. The microwire was removed. Endovascular embolization of the venous diverticulum was performed with detachable coils. Control angiograms were performed. Coils implanted: Axium Prime 12 mm x 40 cm, Axium Prime 8 mm x 20 cm, Axium Prime 8 mm x 30 cm, Axium Prime 10 mm x 40 cm, Axium Prime 10 mm x 30 cm, Axium 8 mm x 15 cm, Axium 8 mm x 15 cm, Axium 4 mm x 12 cm, Axium 3 mm x 8 cm and Axium all 3 mm x 8 cm. Control angiograms showed complete occlusion of the venous diverticulum and proximal aspect of the emissary vein without herniation into  the venous sinus which remains widely patent. Left internal carotid artery angiograms with frontal and lateral views of the head shows no evidence of thromboembolic complication. Flat panel CT of the head was obtained and post processed in a separate workstation with concurrent attending physician supervision. Selected images were sent to PACS. No evidence of hemorrhagic complication. The venous and arterial catheters were subsequently withdrawn. Right common femoral artery angiogram was obtained in right anterior oblique view. The puncture is at the level of the common femoral artery. The artery has normal caliber, adequate for closure device. The sheath was exchanged over the wire for a Perclose ProGlide which was utilized for access closure. Immediate hemostasis was achieved. The right common femoral sheath was removed and manual pressure was held for 15 minutes. Adequate hemostasis was achieved. 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 PLAN: Patient admitted to ICU for overnight observation. Electronically Signed   By: Matthew Cain M.D.   On: 09/28/2022 16:34   IR Angiogram Follow Up Study  Result Date: 09/28/2022 INDICATION: Matthew Cain is a 66 year old male with past medical history significant for chronic diastolic heart  failure, DM II, CVA, HTN and renal insufficiency. He complains of left sided pulsatile tinnitus that coincides with heartbeat that disrupts his sleep. He initially underwent a diagnostic cerebral angiogram on 06/03/2021. However, images were degraded by motion. 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 This was marked guided by fluoroscopy and patient confirmed it coincides with the location where he hears the pulsatile tinnitus. He comes to our service today for endovascular embolization of the left jugular bulb diverticulum. EXAM: ULTRASOUND-GUIDED ARTERIAL AND VENOUS ACCESS DIAGNOSTIC CEREBRAL ANGIOGRAM ENDOVASCULAR TRANSCATHETER EMBOLIZATION FLAT PANEL HEAD CT COMPARISON:  Cerebral angiogram September 07, 2022 and June 03, 2021. MEDICATIONS: 2 g of Ancef IV. The antibiotic was administered within 1 hour of the procedure ANESTHESIA/SEDATION: The procedure was performed under general anesthesia. CONTRAST:  65 mL of Omnipaque 300 milligram/mL. FLUOROSCOPY: Radiation Exposure Index (as provided by the fluoroscopic device): 3,007 mGy Kerma COMPLICATIONS: None immediate. TECHNIQUE: Informed written consent was obtained from the patient after a thorough discussion of the procedural risks, benefits and alternatives. All questions were addressed. Maximal Sterile Barrier Technique was utilized including caps, mask, sterile gowns, sterile gloves, sterile drape, hand hygiene and skin antiseptic. A timeout was performed prior to the initiation of the procedure. The right groin was prepped and draped in the usual sterile fashion. Using a micropuncture kit and the modified Seldinger technique, access was gained to the right common femoral artery and a 6 French sheath was placed. Real-time ultrasound guidance was utilized for vascular access including the acquisition of a permanent ultrasound image documenting patency of the accessed vessel. Using a micropuncture  kit and the modified Seldinger technique, access was gained to the right common femoral vein and an 8 French sheath was placed. Real-time ultrasound guidance was utilized for vascular access including the acquisition of a permanent ultrasound image documenting patency of the accessed vessel. Under fluoroscopy, a 5 French Simmons 2 glide catheter was navigated over a 0.035" Terumo Glidewire via the right common femoral arterial access into the aortic arch. The catheter was placed into the left common carotid artery frontal and lateral angiograms of the neck were obtained. Using biplane roadmap guidance, the catheter was then advanced into the left internal carotid artery. The diagnostic catheter was  removed. Frontal and lateral angiograms of the head were obtained. Under fluoroscopy, a Cerebase guide catheter was navigated over a 0.035" Terumo Glidewire via the right common femoral vein access into the superior vena cava. A 6 [place] Berenstein 2 catheter was then navigated through the guide catheter and over a 0.035" Terumo Glidewire into the left brachiocephalic vein. Angiograms were obtained via arterial contrast injection with frontal and lateral views of the neck. Using biplane roadmap guidance, multiple attempts to navigate the catheter into the left internal jugular vein with different wires proved unsuccessful. Using road map guidance, the catheter was then retracted into the superior vena cava and then navigated into the right internal jugular vein. Then, the Berenstein catheter was navigated over the wire into the right jugular bulb. The Cerebase catheter was then navigated over the [place] and the Glidewire into the right sigmoid sinus. Frontal and lateral angiograms of the head for obtained via left internal carotid artery contrast injection. FINDINGS: 1. Normal caliber of the right common femoral artery, adequate for vascular access. 2. Patent right common femoral vein, adequate for vascular access. 3.  Left CCA angiograms: Calcified atherosclerotic changes of the left carotid bifurcation without hemodynamically significant stenosis. 4. Left ICA angiograms: Brisk vascular contrast filling of the left ACA and MCA vascular trees. There is also brisk contrast opacification the left PCA vascular tree (fetal PCA). The visualized dural sinuses are patent. However, there is significant drainage anteriorly via sphenoparietal sinus as well as via left occipital emissary veins. A focal extracranial dilatation of an emissary vein draining the left sigmoid sinus/jugular bulb is again identified. This venous dilatation measures approximately 1.4 x 1.1 cm and drains into the posterior cervical region. PROCEDURE: Magnified cerebral angiograms were obtained via arterial contrast injection with left anterior oblique and Schuller's view of the head. Using biplane roadmap guidance, an Apro 70 intermediate catheter was navigated over a phenom 17 microcatheter and an Aristotle 14 microguidewire into the left sigmoid sinus. The microcatheter was then advanced over the wire into diverticulum from an emissary vein draining the left sigmoid sinus/jugular bulb. The microwire was removed. Endovascular embolization of the venous diverticulum was performed with detachable coils. Control angiograms were performed. Coils implanted: Axium Prime 12 mm x 40 cm, Axium Prime 8 mm x 20 cm, Axium Prime 8 mm x 30 cm, Axium Prime 10 mm x 40 cm, Axium Prime 10 mm x 30 cm, Axium 8 mm x 15 cm, Axium 8 mm x 15 cm, Axium 4 mm x 12 cm, Axium 3 mm x 8 cm and Axium all 3 mm x 8 cm. Control angiograms showed complete occlusion of the venous diverticulum and proximal aspect of the emissary vein without herniation into the venous sinus which remains widely patent. Left internal carotid artery angiograms with frontal and lateral views of the head shows no evidence of thromboembolic complication. Flat panel CT of the head was obtained and post processed in a separate  workstation with concurrent attending physician supervision. Selected images were sent to PACS. No evidence of hemorrhagic complication. The venous and arterial catheters were subsequently withdrawn. Right common femoral artery angiogram was obtained in right anterior oblique view. The puncture is at the level of the common femoral artery. The artery has normal caliber, adequate for closure device. The sheath was exchanged over the wire for a Perclose ProGlide which was utilized for access closure. Immediate hemostasis was achieved. The right common femoral sheath was removed and manual pressure was held for 15 minutes. Adequate hemostasis  was achieved. 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 PLAN: Patient admitted to ICU for overnight observation. Electronically Signed   By: Matthew Cain M.D.   On: 09/28/2022 16:34   IR Angiogram Follow Up Study  Result Date: 09/28/2022 INDICATION: Matthew Cain is a 66 year old male with past medical history significant for chronic diastolic heart failure, DM II, CVA, HTN and renal insufficiency. He complains of left sided pulsatile tinnitus that coincides with heartbeat that disrupts his sleep. He initially underwent a diagnostic cerebral angiogram on 06/03/2021. However, images were degraded by motion. 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 This was marked guided by fluoroscopy and patient confirmed it coincides with the location where he hears the pulsatile tinnitus. He comes to our service today for endovascular embolization of the left jugular bulb diverticulum. EXAM: ULTRASOUND-GUIDED ARTERIAL AND VENOUS ACCESS DIAGNOSTIC CEREBRAL ANGIOGRAM ENDOVASCULAR TRANSCATHETER EMBOLIZATION FLAT PANEL HEAD CT COMPARISON:  Cerebral angiogram September 07, 2022 and June 03, 2021. MEDICATIONS: 2 g of  Ancef IV. The antibiotic was administered within 1 hour of the procedure ANESTHESIA/SEDATION: The procedure was performed under general anesthesia. CONTRAST:  65 mL of Omnipaque 300 milligram/mL. FLUOROSCOPY: Radiation Exposure Index (as provided by the fluoroscopic device): 8,676 mGy Kerma COMPLICATIONS: None immediate. TECHNIQUE: Informed written consent was obtained from the patient after a thorough discussion of the procedural risks, benefits and alternatives. All questions were addressed. Maximal Sterile Barrier Technique was utilized including caps, mask, sterile gowns, sterile gloves, sterile drape, hand hygiene and skin antiseptic. A timeout was performed prior to the initiation of the procedure. The right groin was prepped and draped in the usual sterile fashion. Using a micropuncture kit and the modified Seldinger technique, access was gained to the right common femoral artery and a 6 French sheath was placed. Real-time ultrasound guidance was utilized for vascular access including the acquisition of a permanent ultrasound image documenting patency of the accessed vessel. Using a micropuncture kit and the modified Seldinger technique, access was gained to the right common femoral vein and an 8 French sheath was placed. Real-time ultrasound guidance was utilized for vascular access including the acquisition of a permanent ultrasound image documenting patency of the accessed vessel. Under fluoroscopy, a 5 French Simmons 2 glide catheter was navigated over a 0.035" Terumo Glidewire via the right common femoral arterial access into the aortic arch. The catheter was placed into the left common carotid artery frontal and lateral angiograms of the neck were obtained. Using biplane roadmap guidance, the catheter was then advanced into the left internal carotid artery. The diagnostic catheter was removed. Frontal and lateral angiograms of the head were obtained. Under fluoroscopy, a Cerebase guide catheter was  navigated over a 0.035" Terumo Glidewire via the right common femoral vein access into the superior vena cava. A 6 [place] Berenstein 2 catheter was then navigated through the guide catheter and over a 0.035" Terumo Glidewire into the left brachiocephalic vein. Angiograms were obtained via arterial contrast injection with frontal and lateral views of the neck. Using biplane roadmap guidance, multiple attempts to navigate the catheter into the left internal jugular vein with different wires proved unsuccessful. Using road map guidance, the catheter was then retracted into the superior vena cava and then navigated into the right internal jugular vein. Then, the Berenstein catheter was navigated over the wire into the right jugular bulb. The Cerebase catheter  was then navigated over the [place] and the Glidewire into the right sigmoid sinus. Frontal and lateral angiograms of the head for obtained via left internal carotid artery contrast injection. FINDINGS: 1. Normal caliber of the right common femoral artery, adequate for vascular access. 2. Patent right common femoral vein, adequate for vascular access. 3. Left CCA angiograms: Calcified atherosclerotic changes of the left carotid bifurcation without hemodynamically significant stenosis. 4. Left ICA angiograms: Brisk vascular contrast filling of the left ACA and MCA vascular trees. There is also brisk contrast opacification the left PCA vascular tree (fetal PCA). The visualized dural sinuses are patent. However, there is significant drainage anteriorly via sphenoparietal sinus as well as via left occipital emissary veins. A focal extracranial dilatation of an emissary vein draining the left sigmoid sinus/jugular bulb is again identified. This venous dilatation measures approximately 1.4 x 1.1 cm and drains into the posterior cervical region. PROCEDURE: Magnified cerebral angiograms were obtained via arterial contrast injection with left anterior oblique and  Schuller's view of the head. Using biplane roadmap guidance, an Apro 70 intermediate catheter was navigated over a phenom 17 microcatheter and an Aristotle 14 microguidewire into the left sigmoid sinus. The microcatheter was then advanced over the wire into diverticulum from an emissary vein draining the left sigmoid sinus/jugular bulb. The microwire was removed. Endovascular embolization of the venous diverticulum was performed with detachable coils. Control angiograms were performed. Coils implanted: Axium Prime 12 mm x 40 cm, Axium Prime 8 mm x 20 cm, Axium Prime 8 mm x 30 cm, Axium Prime 10 mm x 40 cm, Axium Prime 10 mm x 30 cm, Axium 8 mm x 15 cm, Axium 8 mm x 15 cm, Axium 4 mm x 12 cm, Axium 3 mm x 8 cm and Axium all 3 mm x 8 cm. Control angiograms showed complete occlusion of the venous diverticulum and proximal aspect of the emissary vein without herniation into the venous sinus which remains widely patent. Left internal carotid artery angiograms with frontal and lateral views of the head shows no evidence of thromboembolic complication. Flat panel CT of the head was obtained and post processed in a separate workstation with concurrent attending physician supervision. Selected images were sent to PACS. No evidence of hemorrhagic complication. The venous and arterial catheters were subsequently withdrawn. Right common femoral artery angiogram was obtained in right anterior oblique view. The puncture is at the level of the common femoral artery. The artery has normal caliber, adequate for closure device. The sheath was exchanged over the wire for a Perclose ProGlide which was utilized for access closure. Immediate hemostasis was achieved. The right common femoral sheath was removed and manual pressure was held for 15 minutes. Adequate hemostasis was achieved. 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 PLAN:  Patient admitted to ICU for overnight observation. Electronically Signed   By: Matthew Cain M.D.   On: 09/28/2022 16:34   IR Angiogram Follow Up Study  Result Date: 09/28/2022 INDICATION: Matthew Cain is a 66 year old male with past medical history significant for chronic diastolic heart failure, DM II, CVA, HTN and renal insufficiency. He complains of left sided pulsatile tinnitus that coincides with heartbeat that disrupts his sleep. He initially underwent a diagnostic cerebral angiogram on 06/03/2021. However, images were degraded by motion. 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 This was marked guided by fluoroscopy and patient  confirmed it coincides with the location where he hears the pulsatile tinnitus. He comes to our service today for endovascular embolization of the left jugular bulb diverticulum. EXAM: ULTRASOUND-GUIDED ARTERIAL AND VENOUS ACCESS DIAGNOSTIC CEREBRAL ANGIOGRAM ENDOVASCULAR TRANSCATHETER EMBOLIZATION FLAT PANEL HEAD CT COMPARISON:  Cerebral angiogram September 07, 2022 and June 03, 2021. MEDICATIONS: 2 g of Ancef IV. The antibiotic was administered within 1 hour of the procedure ANESTHESIA/SEDATION: The procedure was performed under general anesthesia. CONTRAST:  65 mL of Omnipaque 300 milligram/mL. FLUOROSCOPY: Radiation Exposure Index (as provided by the fluoroscopic device): 5,102 mGy Kerma COMPLICATIONS: None immediate. TECHNIQUE: Informed written consent was obtained from the patient after a thorough discussion of the procedural risks, benefits and alternatives. All questions were addressed. Maximal Sterile Barrier Technique was utilized including caps, mask, sterile gowns, sterile gloves, sterile drape, hand hygiene and skin antiseptic. A timeout was performed prior to the initiation of the procedure. The right groin was prepped and draped in the usual sterile fashion. Using a micropuncture kit and  the modified Seldinger technique, access was gained to the right common femoral artery and a 6 French sheath was placed. Real-time ultrasound guidance was utilized for vascular access including the acquisition of a permanent ultrasound image documenting patency of the accessed vessel. Using a micropuncture kit and the modified Seldinger technique, access was gained to the right common femoral vein and an 8 French sheath was placed. Real-time ultrasound guidance was utilized for vascular access including the acquisition of a permanent ultrasound image documenting patency of the accessed vessel. Under fluoroscopy, a 5 French Simmons 2 glide catheter was navigated over a 0.035" Terumo Glidewire via the right common femoral arterial access into the aortic arch. The catheter was placed into the left common carotid artery frontal and lateral angiograms of the neck were obtained. Using biplane roadmap guidance, the catheter was then advanced into the left internal carotid artery. The diagnostic catheter was removed. Frontal and lateral angiograms of the head were obtained. Under fluoroscopy, a Cerebase guide catheter was navigated over a 0.035" Terumo Glidewire via the right common femoral vein access into the superior vena cava. A 6 [place] Berenstein 2 catheter was then navigated through the guide catheter and over a 0.035" Terumo Glidewire into the left brachiocephalic vein. Angiograms were obtained via arterial contrast injection with frontal and lateral views of the neck. Using biplane roadmap guidance, multiple attempts to navigate the catheter into the left internal jugular vein with different wires proved unsuccessful. Using road map guidance, the catheter was then retracted into the superior vena cava and then navigated into the right internal jugular vein. Then, the Berenstein catheter was navigated over the wire into the right jugular bulb. The Cerebase catheter was then navigated over the [place] and the  Glidewire into the right sigmoid sinus. Frontal and lateral angiograms of the head for obtained via left internal carotid artery contrast injection. FINDINGS: 1. Normal caliber of the right common femoral artery, adequate for vascular access. 2. Patent right common femoral vein, adequate for vascular access. 3. Left CCA angiograms: Calcified atherosclerotic changes of the left carotid bifurcation without hemodynamically significant stenosis. 4. Left ICA angiograms: Brisk vascular contrast filling of the left ACA and MCA vascular trees. There is also brisk contrast opacification the left PCA vascular tree (fetal PCA). The visualized dural sinuses are patent. However, there is significant drainage anteriorly via sphenoparietal sinus as well as via left occipital emissary veins. A focal extracranial dilatation of an emissary vein draining the left sigmoid sinus/jugular  bulb is again identified. This venous dilatation measures approximately 1.4 x 1.1 cm and drains into the posterior cervical region. PROCEDURE: Magnified cerebral angiograms were obtained via arterial contrast injection with left anterior oblique and Schuller's view of the head. Using biplane roadmap guidance, an Apro 70 intermediate catheter was navigated over a phenom 17 microcatheter and an Aristotle 14 microguidewire into the left sigmoid sinus. The microcatheter was then advanced over the wire into diverticulum from an emissary vein draining the left sigmoid sinus/jugular bulb. The microwire was removed. Endovascular embolization of the venous diverticulum was performed with detachable coils. Control angiograms were performed. Coils implanted: Axium Prime 12 mm x 40 cm, Axium Prime 8 mm x 20 cm, Axium Prime 8 mm x 30 cm, Axium Prime 10 mm x 40 cm, Axium Prime 10 mm x 30 cm, Axium 8 mm x 15 cm, Axium 8 mm x 15 cm, Axium 4 mm x 12 cm, Axium 3 mm x 8 cm and Axium all 3 mm x 8 cm. Control angiograms showed complete occlusion of the venous diverticulum  and proximal aspect of the emissary vein without herniation into the venous sinus which remains widely patent. Left internal carotid artery angiograms with frontal and lateral views of the head shows no evidence of thromboembolic complication. Flat panel CT of the head was obtained and post processed in a separate workstation with concurrent attending physician supervision. Selected images were sent to PACS. No evidence of hemorrhagic complication. The venous and arterial catheters were subsequently withdrawn. Right common femoral artery angiogram was obtained in right anterior oblique view. The puncture is at the level of the common femoral artery. The artery has normal caliber, adequate for closure device. The sheath was exchanged over the wire for a Perclose ProGlide which was utilized for access closure. Immediate hemostasis was achieved. The right common femoral sheath was removed and manual pressure was held for 15 minutes. Adequate hemostasis was achieved. 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 PLAN: Patient admitted to ICU for overnight observation. Electronically Signed   By: Matthew Cain M.D.   On: 09/28/2022 16:34   IR Angiogram Follow Up Study  Result Date: 09/28/2022 INDICATION: Matthew Cain is a 66 year old male with past medical history significant for chronic diastolic heart failure, DM II, CVA, HTN and renal insufficiency. He complains of left sided pulsatile tinnitus that coincides with heartbeat that disrupts his sleep. He initially underwent a diagnostic cerebral angiogram on 06/03/2021. However, images were degraded by motion. 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 This was marked guided by fluoroscopy and patient confirmed it coincides with the location where he hears the pulsatile tinnitus. He comes  to our service today for endovascular embolization of the left jugular bulb diverticulum. EXAM: ULTRASOUND-GUIDED ARTERIAL AND VENOUS ACCESS DIAGNOSTIC CEREBRAL ANGIOGRAM ENDOVASCULAR TRANSCATHETER EMBOLIZATION FLAT PANEL HEAD CT COMPARISON:  Cerebral angiogram September 07, 2022 and June 03, 2021. MEDICATIONS: 2 g of Ancef IV. The antibiotic was administered within 1 hour of the procedure ANESTHESIA/SEDATION: The procedure was performed under general anesthesia. CONTRAST:  65 mL of Omnipaque 300 milligram/mL. FLUOROSCOPY: Radiation Exposure Index (as provided by the fluoroscopic device): 6,659 mGy Kerma COMPLICATIONS: None immediate. TECHNIQUE: Informed written consent was obtained from the patient after a thorough discussion of the procedural risks, benefits and alternatives. All questions were addressed. Maximal Sterile Barrier Technique was utilized including caps, mask,  sterile gowns, sterile gloves, sterile drape, hand hygiene and skin antiseptic. A timeout was performed prior to the initiation of the procedure. The right groin was prepped and draped in the usual sterile fashion. Using a micropuncture kit and the modified Seldinger technique, access was gained to the right common femoral artery and a 6 French sheath was placed. Real-time ultrasound guidance was utilized for vascular access including the acquisition of a permanent ultrasound image documenting patency of the accessed vessel. Using a micropuncture kit and the modified Seldinger technique, access was gained to the right common femoral vein and an 8 French sheath was placed. Real-time ultrasound guidance was utilized for vascular access including the acquisition of a permanent ultrasound image documenting patency of the accessed vessel. Under fluoroscopy, a 5 French Simmons 2 glide catheter was navigated over a 0.035" Terumo Glidewire via the right common femoral arterial access into the aortic arch. The catheter was placed into the left common  carotid artery frontal and lateral angiograms of the neck were obtained. Using biplane roadmap guidance, the catheter was then advanced into the left internal carotid artery. The diagnostic catheter was removed. Frontal and lateral angiograms of the head were obtained. Under fluoroscopy, a Cerebase guide catheter was navigated over a 0.035" Terumo Glidewire via the right common femoral vein access into the superior vena cava. A 6 [place] Berenstein 2 catheter was then navigated through the guide catheter and over a 0.035" Terumo Glidewire into the left brachiocephalic vein. Angiograms were obtained via arterial contrast injection with frontal and lateral views of the neck. Using biplane roadmap guidance, multiple attempts to navigate the catheter into the left internal jugular vein with different wires proved unsuccessful. Using road map guidance, the catheter was then retracted into the superior vena cava and then navigated into the right internal jugular vein. Then, the Berenstein catheter was navigated over the wire into the right jugular bulb. The Cerebase catheter was then navigated over the [place] and the Glidewire into the right sigmoid sinus. Frontal and lateral angiograms of the head for obtained via left internal carotid artery contrast injection. FINDINGS: 1. Normal caliber of the right common femoral artery, adequate for vascular access. 2. Patent right common femoral vein, adequate for vascular access. 3. Left CCA angiograms: Calcified atherosclerotic changes of the left carotid bifurcation without hemodynamically significant stenosis. 4. Left ICA angiograms: Brisk vascular contrast filling of the left ACA and MCA vascular trees. There is also brisk contrast opacification the left PCA vascular tree (fetal PCA). The visualized dural sinuses are patent. However, there is significant drainage anteriorly via sphenoparietal sinus as well as via left occipital emissary veins. A focal extracranial  dilatation of an emissary vein draining the left sigmoid sinus/jugular bulb is again identified. This venous dilatation measures approximately 1.4 x 1.1 cm and drains into the posterior cervical region. PROCEDURE: Magnified cerebral angiograms were obtained via arterial contrast injection with left anterior oblique and Schuller's view of the head. Using biplane roadmap guidance, an Apro 70 intermediate catheter was navigated over a phenom 17 microcatheter and an Aristotle 14 microguidewire into the left sigmoid sinus. The microcatheter was then advanced over the wire into diverticulum from an emissary vein draining the left sigmoid sinus/jugular bulb. The microwire was removed. Endovascular embolization of the venous diverticulum was performed with detachable coils. Control angiograms were performed. Coils implanted: Axium Prime 12 mm x 40 cm, Axium Prime 8 mm x 20 cm, Axium Prime 8 mm x 30 cm, Axium Prime 10 mm x 40 cm,  Axium Prime 10 mm x 30 cm, Axium 8 mm x 15 cm, Axium 8 mm x 15 cm, Axium 4 mm x 12 cm, Axium 3 mm x 8 cm and Axium all 3 mm x 8 cm. Control angiograms showed complete occlusion of the venous diverticulum and proximal aspect of the emissary vein without herniation into the venous sinus which remains widely patent. Left internal carotid artery angiograms with frontal and lateral views of the head shows no evidence of thromboembolic complication. Flat panel CT of the head was obtained and post processed in a separate workstation with concurrent attending physician supervision. Selected images were sent to PACS. No evidence of hemorrhagic complication. The venous and arterial catheters were subsequently withdrawn. Right common femoral artery angiogram was obtained in right anterior oblique view. The puncture is at the level of the common femoral artery. The artery has normal caliber, adequate for closure device. The sheath was exchanged over the wire for a Perclose ProGlide which was utilized for access  closure. Immediate hemostasis was achieved. The right common femoral sheath was removed and manual pressure was held for 15 minutes. Adequate hemostasis was achieved. 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 PLAN: Patient admitted to ICU for overnight observation. Electronically Signed   By: Matthew Cain M.D.   On: 09/28/2022 16:34   IR CT Head Ltd  Result Date: 09/28/2022 INDICATION: Tynan Boesel is a 66 year old male with past medical history significant for chronic diastolic heart failure, DM II, CVA, HTN and renal insufficiency. He complains of left sided pulsatile tinnitus that coincides with heartbeat that disrupts his sleep. He initially underwent a diagnostic cerebral angiogram on 06/03/2021. However, images were degraded by motion. 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 This was marked guided by fluoroscopy and patient confirmed it coincides with the location where he hears the pulsatile tinnitus. He comes to our service today for endovascular embolization of the left jugular bulb diverticulum. EXAM: ULTRASOUND-GUIDED ARTERIAL AND VENOUS ACCESS DIAGNOSTIC CEREBRAL ANGIOGRAM ENDOVASCULAR TRANSCATHETER EMBOLIZATION FLAT PANEL HEAD CT COMPARISON:  Cerebral angiogram September 07, 2022 and June 03, 2021. MEDICATIONS: 2 g of Ancef IV. The antibiotic was administered within 1 hour of the procedure ANESTHESIA/SEDATION: The procedure was performed under general anesthesia. CONTRAST:  65 mL of Omnipaque 300 milligram/mL. FLUOROSCOPY: Radiation Exposure Index (as provided by the fluoroscopic device): 0,932 mGy Kerma COMPLICATIONS: None immediate. TECHNIQUE: Informed written consent was obtained from the patient after a thorough discussion of the procedural risks, benefits and alternatives. All questions were addressed. Maximal  Sterile Barrier Technique was utilized including caps, mask, sterile gowns, sterile gloves, sterile drape, hand hygiene and skin antiseptic. A timeout was performed prior to the initiation of the procedure. The right groin was prepped and draped in the usual sterile fashion. Using a micropuncture kit and the modified Seldinger technique, access was gained to the right common femoral artery and a 6 French sheath was placed. Real-time ultrasound guidance was utilized for vascular access including the acquisition of a permanent ultrasound image documenting patency of the accessed vessel. Using a micropuncture kit and the modified Seldinger technique, access was gained to the right common femoral vein and an 8 French sheath was placed. Real-time ultrasound guidance was utilized for vascular access including the acquisition of a permanent ultrasound image documenting patency of the accessed vessel. Under fluoroscopy, a 5 [place] Simmons 2 glide  catheter was navigated over a 0.035" Terumo Glidewire via the right common femoral arterial access into the aortic arch. The catheter was placed into the left common carotid artery frontal and lateral angiograms of the neck were obtained. Using biplane roadmap guidance, the catheter was then advanced into the left internal carotid artery. The diagnostic catheter was removed. Frontal and lateral angiograms of the head were obtained. Under fluoroscopy, a Cerebase guide catheter was navigated over a 0.035" Terumo Glidewire via the right common femoral vein access into the superior vena cava. A 6 [place] Berenstein 2 catheter was then navigated through the guide catheter and over a 0.035" Terumo Glidewire into the left brachiocephalic vein. Angiograms were obtained via arterial contrast injection with frontal and lateral views of the neck. Using biplane roadmap guidance, multiple attempts to navigate the catheter into the left internal jugular vein with different wires proved  unsuccessful. Using road map guidance, the catheter was then retracted into the superior vena cava and then navigated into the right internal jugular vein. Then, the Berenstein catheter was navigated over the wire into the right jugular bulb. The Cerebase catheter was then navigated over the [place] and the Glidewire into the right sigmoid sinus. Frontal and lateral angiograms of the head for obtained via left internal carotid artery contrast injection. FINDINGS: 1. Normal caliber of the right common femoral artery, adequate for vascular access. 2. Patent right common femoral vein, adequate for vascular access. 3. Left CCA angiograms: Calcified atherosclerotic changes of the left carotid bifurcation without hemodynamically significant stenosis. 4. Left ICA angiograms: Brisk vascular contrast filling of the left ACA and MCA vascular trees. There is also brisk contrast opacification the left PCA vascular tree (fetal PCA). The visualized dural sinuses are patent. However, there is significant drainage anteriorly via sphenoparietal sinus as well as via left occipital emissary veins. A focal extracranial dilatation of an emissary vein draining the left sigmoid sinus/jugular bulb is again identified. This venous dilatation measures approximately 1.4 x 1.1 cm and drains into the posterior cervical region. PROCEDURE: Magnified cerebral angiograms were obtained via arterial contrast injection with left anterior oblique and Schuller's view of the head. Using biplane roadmap guidance, an Apro 70 intermediate catheter was navigated over a phenom 17 microcatheter and an Aristotle 14 microguidewire into the left sigmoid sinus. The microcatheter was then advanced over the wire into diverticulum from an emissary vein draining the left sigmoid sinus/jugular bulb. The microwire was removed. Endovascular embolization of the venous diverticulum was performed with detachable coils. Control angiograms were performed. Coils implanted:  Axium Prime 12 mm x 40 cm, Axium Prime 8 mm x 20 cm, Axium Prime 8 mm x 30 cm, Axium Prime 10 mm x 40 cm, Axium Prime 10 mm x 30 cm, Axium 8 mm x 15 cm, Axium 8 mm x 15 cm, Axium 4 mm x 12 cm, Axium 3 mm x 8 cm and Axium all 3 mm x 8 cm. Control angiograms showed complete occlusion of the venous diverticulum and proximal aspect of the emissary vein without herniation into the venous sinus which remains widely patent. Left internal carotid artery angiograms with frontal and lateral views of the head shows no evidence of thromboembolic complication. Flat panel CT of the head was obtained and post processed in a separate workstation with concurrent attending physician supervision. Selected images were sent to PACS. No evidence of hemorrhagic complication. The venous and arterial catheters were subsequently withdrawn. Right common femoral artery angiogram was obtained in right anterior oblique view. The  puncture is at the level of the common femoral artery. The artery has normal caliber, adequate for closure device. The sheath was exchanged over the wire for a Perclose ProGlide which was utilized for access closure. Immediate hemostasis was achieved. The right common femoral sheath was removed and manual pressure was held for 15 minutes. Adequate hemostasis was achieved. 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 PLAN: Patient admitted to ICU for overnight observation. Electronically Signed   By: Matthew Cain M.D.   On: 09/28/2022 16:34   IR US Guide Vasc Access Right  Result Date: 09/08/2022 INDICATION: Rachid Parham is a 66 year old male with past medical history significant for chronic diastolic heart failure, DM II, CVA, HTN and renal insufficiency. Pt c/o left sided pulsatile tinnitus that coincides with heartbeat that disrupts his sleep. Pt underwent cerebral angiogram for the same complaint  July 2022 that was positive for diverticulum on the right side. However, the images were degraded by motion. EXAM: ULTRASOUND-GUIDED VASCULAR ACCESS DIAGNOSTIC CEREBRAL ANGIOGRAM COMPARISON:  Cerebral angiogram June 03, 2021; CT angiogram of the head and neck May 23, 2021. MEDICATIONS: No antibiotics administered. ANESTHESIA/SEDATION: A total of fentanyl 25 mcg was administered intravenously by the radiology nurse. The patient's vital signs were monitored continuously by radiology nursing throughout the procedure under my direct supervision. CONTRAST:  100 mL of Omnipaque 300 mg/mL FLUOROSCOPY: Radiation Exposure Index (as provided by the fluoroscopic device): PSD 4,854 mGy Kerma COMPLICATIONS: None immediate. TECHNIQUE: Informed written consent was obtained from the patient after a thorough discussion of the procedural risks, benefits and alternatives. All questions were addressed. Maximal Sterile Barrier Technique was utilized including caps, mask, sterile gowns, sterile gloves, sterile drape, hand hygiene and skin antiseptic. A timeout was performed prior to the initiation of the procedure. The right groin was prepped and draped in the usual sterile fashion. Using a micropuncture kit and the modified Seldinger technique, access was gained to the right common femoral artery and a 5 French sheath was placed. Real-time ultrasound guidance was utilized for vascular access including the acquisition of a permanent ultrasound image documenting patency of the accessed vessel. Under fluoroscopy, a 5 [place] Berenstein 2 catheter was navigated over a 0.035" Terumo Glidewire into the aortic arch. The catheter was placed into the left common carotid artery. Frontal and lateral angiograms of the neck were obtained. Using biplane roadmap guidance [place] space into the left internal carotid. Frontal and lateral angiograms of the head obtained followed by magnified left anterior oblique and magnified Schuller's view of the  head were obtained. Next, the catheter was placed into the right common carotid artery. On the biplane roadmap guidance, the catheter was placed into the left occipital artery magnified frontal and lateral views of the head were obtained, centered on the posterior fossa. The catheter was then retracted into the left external carotid artery. Frontal and lateral angiograms of the head were obtained. The catheter was then placed into the right common carotid artery. Frontal and lateral angiograms of the neck were obtained. Using biplane roadmap guidance, the catheter was placed into the right external carotid artery. Frontal and lateral angiograms of the head were obtained. Using biplane roadmap guidance, the catheter was placed into the right internal carotid artery. Frontal, lateral, magnified right anterior oblique and magnified lateral views of the head were obtained. The catheter was then placed into the right subclavian artery. Frontal angiograms of the neck were obtained.  Using road map guidance, the catheter was placed into the right internal carotid artery frontal and lateral angiograms of the head were obtained. The catheter was subsequently withdrawn. Right common femoral artery angiogram was obtained in right anterior oblique view. The puncture is at the level of the common femoral artery. The artery has normal caliber, adequate for closure device. A 5 [place] Exoseal was utilized for access closure. Immediate hemostasis was achieved. FINDINGS: Left CCA angiograms: Calcified atherosclerotic changes of the left carotid bifurcation without hemodynamically significant stenosis. Left ICA angiograms: There is brisk vascular contrast filling of the left ACA and MCA vascular trees. There is also brisk contrast opacification the left PCA vascular tree (fetal PCA). Diffuse luminal irregularity of the intracranial left ICA, left MCA and left PCA consistent with intracranial atherosclerotic disease with multifocal  areas of mild stenosis. There is moderate stenosis at the left P2/PCA, left A2-A3/ACA junction and left M3/MCA posterior division branch. No aneurysms or abnormally high-flow, early draining veins are seen. No regions of abnormal hypervascularity are noted. The visualized dural sinuses are patent. However, there is significant drainage anteriorly via sphenoparietal sinus as well as via left occipital emissary veins. A focal extracranial dilatation of an emissary vein draining the left sigmoid sinus was identified and marked externally with assistance of roadmap. Patient confirmed that this corresponds to the location of pulsatile sound. This venous dilatation measures approximately 1.4 x 1.1 cm and drains into the posterior cervical region. Left ECA and occipital artery angiograms: No early venous drainage was noted. The visualized branches of the left external carotid artery are unremarkable. Right CCA angiograms: Prominently calcified atherosclerotic disease of the right carotid bifurcation without hemodynamically significant stenosis. Right ICA angiograms: There is brisk vascular contrast filling of the right ACA and MCA vascular trees. Prominent luminal irregularity along the right MCA and ACA vascular tree consistent with intracranial atherosclerotic disease. There is moderate at the distal right M1/MCA and right A3/ACA to prior angiogram. No aneurysms or abnormally high-flow, early draining veins are seen. No regions of abnormal hypervascularity are noted. The visualized dural sinuses are patent. Prominent vein branching from the right jugular vein at the skull base, as seen prior angiogram. Right ECA angiograms: No early venous drainage was noted. The visualized branches of the right external carotid artery are unremarkable. Right vertebral artery angiograms: Diffuse luminal irregularity of the intracranial right vertebral artery, basilar artery, right PCA and bilateral superior cerebellar arteries,  consistent with intracranial atherosclerotic disease. The left posterior cerebral artery is not visualized due to direct origin from the left ICA (fetal PCA). Moderate stenosis is seen at the V4 segment of the right vertebral artery distal to the origin of the right PICA. The left vertebral artery is seen by contrast reflux and show severe stenosis at the vertebrobasilar junction. There is also severe stenosis at the P2 segment of the right PCA. No abnormally high-flow, early draining veins are seen. No regions of abnormal hypervascularity are noted. The visualized dural sinuses are patent. Right subclavian angiograms: Calcified atherosclerotic plaque is seen at the proximal right subclavian artery without hemodynamically significant stenosis. The origin of the right vertebral artery is not well visualized. Luminal irregularity is seen along are along all the V1 and V2 segments consistent with atherosclerotic disease without hemodynamically significant stenosis. Right common femoral artery angiograms: The access is at the level of the mid right common femoral artery. The femoral artery has normal caliber, adequate for closure device. PROCEDURE: No intervention performed. IMPRESSION: 1. Venous drainage of  the intracranial circulation is patent with prominent anterior drainage via the sphenoparietal sinus on the left side, left occipital emissary veins and posterior cervical vein branching from the right jugular. Correlation prior CT angiogram showed prominent bilateral posterior belly of the digastric muscle resulting in compression of the jugular veins between the muscle and the internal carotid arteries. This is likely the cause of prominent venous drainage of the intracranial circulation via left sphenoparietal sinus and sigmoid/jugular drainage via emissary veins. The emissary vein draining the left sigmoid sinus shows a focal extracranial dilatation in the left occipital area that corresponds to the location  where the patient feels in hear the pulsatile tinnitus. 2. No evidence of craniocervical arteriovenous fistula. 3. Severe intracranial atherosclerotic disease, more pronounced in the posterior circulation, as detailed above. PLAN: Findings discussed with the patient after the procedure. He would like to proceed with endovascular embolization of the focal dilatation of emissary vein at the location of clinical concern. Electronically Signed   By: Matthew Cain M.D.   On: 09/08/2022 13:49   IR ANGIO INTRA EXTRACRAN SEL INTERNAL CAROTID BILAT MOD SED  Result Date: 09/08/2022 INDICATION: Matthew Cain is a 66 year old male with past medical history significant for chronic diastolic heart failure, DM II, CVA, HTN and renal insufficiency. Pt c/o left sided pulsatile tinnitus that coincides with heartbeat that disrupts his sleep. 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 However, the images were degraded by motion. EXAM: ULTRASOUND-GUIDED VASCULAR ACCESS DIAGNOSTIC CEREBRAL ANGIOGRAM COMPARISON:  Cerebral angiogram June 03, 2021; CT angiogram of the head and neck May 23, 2021. MEDICATIONS: No antibiotics administered. ANESTHESIA/SEDATION: A total of fentanyl 25 mcg was administered intravenously by the radiology nurse. The patient's vital signs were monitored continuously by radiology nursing throughout the procedure under my direct supervision. CONTRAST:  100 mL of Omnipaque 300 mg/mL FLUOROSCOPY: Radiation Exposure Index (as provided by the fluoroscopic device): PSD 6,073 mGy Kerma COMPLICATIONS: None immediate. TECHNIQUE: Informed written consent was obtained from the patient after a thorough discussion of the procedural risks, benefits and alternatives. All questions were addressed. Maximal Sterile Barrier Technique was utilized including caps, mask, sterile gowns, sterile gloves, sterile drape, hand hygiene and skin  antiseptic. A timeout was performed prior to the initiation of the procedure. The right groin was prepped and draped in the usual sterile fashion. Using a micropuncture kit and the modified Seldinger technique, access was gained to the right common femoral artery and a 5 French sheath was placed. Real-time ultrasound guidance was utilized for vascular access including the acquisition of a permanent ultrasound image documenting patency of the accessed vessel. Under fluoroscopy, a 5 [place] Berenstein 2 catheter was navigated over a 0.035" Terumo Glidewire into the aortic arch. The catheter was placed into the left common carotid artery. Frontal and lateral angiograms of the neck were obtained. Using biplane roadmap guidance [place] space into the left internal carotid. Frontal and lateral angiograms of the head obtained followed by magnified left anterior oblique and magnified Schuller's view of the head were obtained. Next, the catheter was placed into the right common carotid artery. On the biplane roadmap guidance, the catheter was placed into the left occipital artery magnified frontal and lateral views of the head were obtained, centered on the posterior fossa. The catheter was then retracted into the left external carotid artery. Frontal and lateral angiograms of the head were obtained. The catheter was then placed into  the right common carotid artery. Frontal and lateral angiograms of the neck were obtained. Using biplane roadmap guidance, the catheter was placed into the right external carotid artery. Frontal and lateral angiograms of the head were obtained. Using biplane roadmap guidance, the catheter was placed into the right internal carotid artery. Frontal, lateral, magnified right anterior oblique and magnified lateral views of the head were obtained. The catheter was then placed into the right subclavian artery. Frontal angiograms of the neck were obtained. Using road map guidance, the catheter was  placed into the right internal carotid artery frontal and lateral angiograms of the head were obtained. The catheter was subsequently withdrawn. Right common femoral artery angiogram was obtained in right anterior oblique view. The puncture is at the level of the common femoral artery. The artery has normal caliber, adequate for closure device. A 5 [place] Exoseal was utilized for access closure. Immediate hemostasis was achieved. FINDINGS: Left CCA angiograms: Calcified atherosclerotic changes of the left carotid bifurcation without hemodynamically significant stenosis. Left ICA angiograms: There is brisk vascular contrast filling of the left ACA and MCA vascular trees. There is also brisk contrast opacification the left PCA vascular tree (fetal PCA). Diffuse luminal irregularity of the intracranial left ICA, left MCA and left PCA consistent with intracranial atherosclerotic disease with multifocal areas of mild stenosis. There is moderate stenosis at the left P2/PCA, left A2-A3/ACA junction and left M3/MCA posterior division branch. No aneurysms or abnormally high-flow, early draining veins are seen. No regions of abnormal hypervascularity are noted. The visualized dural sinuses are patent. However, there is significant drainage anteriorly via sphenoparietal sinus as well as via left occipital emissary veins. A focal extracranial dilatation of an emissary vein draining the left sigmoid sinus was identified and marked externally with assistance of roadmap. Patient confirmed that this corresponds to the location of pulsatile sound. This venous dilatation measures approximately 1.4 x 1.1 cm and drains into the posterior cervical region. Left ECA and occipital artery angiograms: No early venous drainage was noted. The visualized branches of the left external carotid artery are unremarkable. Right CCA angiograms: Prominently calcified atherosclerotic disease of the right carotid bifurcation without hemodynamically  significant stenosis. Right ICA angiograms: There is brisk vascular contrast filling of the right ACA and MCA vascular trees. Prominent luminal irregularity along the right MCA and ACA vascular tree consistent with intracranial atherosclerotic disease. There is moderate at the distal right M1/MCA and right A3/ACA to prior angiogram. No aneurysms or abnormally high-flow, early draining veins are seen. No regions of abnormal hypervascularity are noted. The visualized dural sinuses are patent. Prominent vein branching from the right jugular vein at the skull base, as seen prior angiogram. Right ECA angiograms: No early venous drainage was noted. The visualized branches of the right external carotid artery are unremarkable. Right vertebral artery angiograms: Diffuse luminal irregularity of the intracranial right vertebral artery, basilar artery, right PCA and bilateral superior cerebellar arteries, consistent with intracranial atherosclerotic disease. The left posterior cerebral artery is not visualized due to direct origin from the left ICA (fetal PCA). Moderate stenosis is seen at the V4 segment of the right vertebral artery distal to the origin of the right PICA. The left vertebral artery is seen by contrast reflux and show severe stenosis at the vertebrobasilar junction. There is also severe stenosis at the P2 segment of the right PCA. No abnormally high-flow, early draining veins are seen. No regions of abnormal hypervascularity are noted. The visualized dural sinuses are patent. Right subclavian angiograms: Calcified  atherosclerotic plaque is seen at the proximal right subclavian artery without hemodynamically significant stenosis. The origin of the right vertebral artery is not well visualized. Luminal irregularity is seen along are along all the V1 and V2 segments consistent with atherosclerotic disease without hemodynamically significant stenosis. Right common femoral artery angiograms: The access is at the  level of the mid right common femoral artery. The femoral artery has normal caliber, adequate for closure device. PROCEDURE: No intervention performed. IMPRESSION: 1. Venous drainage of the intracranial circulation is patent with prominent anterior drainage via the sphenoparietal sinus on the left side, left occipital emissary veins and posterior cervical vein branching from the right jugular. Correlation prior CT angiogram showed prominent bilateral posterior belly of the digastric muscle resulting in compression of the jugular veins between the muscle and the internal carotid arteries. This is likely the cause of prominent venous drainage of the intracranial circulation via left sphenoparietal sinus and sigmoid/jugular drainage via emissary veins. The emissary vein draining the left sigmoid sinus shows a focal extracranial dilatation in the left occipital area that corresponds to the location where the patient feels in hear the pulsatile tinnitus. 2. No evidence of craniocervical arteriovenous fistula. 3. Severe intracranial atherosclerotic disease, more pronounced in the posterior circulation, as detailed above. PLAN: Findings discussed with the patient after the procedure. He would like to proceed with endovascular embolization of the focal dilatation of emissary vein at the location of clinical concern. Electronically Signed   By: Matthew Cain M.D.   On: 09/08/2022 13:49   IR ANGIO EXTERNAL CAROTID SEL EXT CAROTID BILAT MOD SED  Result Date: 09/08/2022 INDICATION: Matthew Cain is a 66 year old male with past medical history significant for chronic diastolic heart failure, DM II, CVA, HTN and renal insufficiency. Pt c/o left sided pulsatile tinnitus that coincides with heartbeat that disrupts his sleep. 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 However, the images were degraded by motion. EXAM: ULTRASOUND-GUIDED  VASCULAR ACCESS DIAGNOSTIC CEREBRAL ANGIOGRAM COMPARISON:  Cerebral angiogram June 03, 2021; CT angiogram of the head and neck May 23, 2021. MEDICATIONS: No antibiotics administered. ANESTHESIA/SEDATION: A total of fentanyl 25 mcg was administered intravenously by the radiology nurse. The patient's vital signs were monitored continuously by radiology nursing throughout the procedure under my direct supervision. CONTRAST:  100 mL of Omnipaque 300 mg/mL FLUOROSCOPY: Radiation Exposure Index (as provided by the fluoroscopic device): PSD 9,147 mGy Kerma COMPLICATIONS: None immediate. TECHNIQUE: Informed written consent was obtained from the patient after a thorough discussion of the procedural risks, benefits and alternatives. All questions were addressed. Maximal Sterile Barrier Technique was utilized including caps, mask, sterile gowns, sterile gloves, sterile drape, hand hygiene and skin antiseptic. A timeout was performed prior to the initiation of the procedure. The right groin was prepped and draped in the usual sterile fashion. Using a micropuncture kit and the modified Seldinger technique, access was gained to the right common femoral artery and a 5 French sheath was placed. Real-time ultrasound guidance was utilized for vascular access including the acquisition of a permanent ultrasound image documenting patency of the accessed vessel. Under fluoroscopy, a 5 [place] Berenstein 2 catheter was navigated over a 0.035" Terumo Glidewire into the aortic arch. The catheter was placed into the left common carotid artery. Frontal and lateral angiograms of the neck were obtained. Using biplane roadmap guidance [place] space into the left internal carotid. Frontal and lateral angiograms of the  head obtained followed by magnified left anterior oblique and magnified Schuller's view of the head were obtained. Next, the catheter was placed into the right common carotid artery. On the biplane roadmap guidance, the catheter  was placed into the left occipital artery magnified frontal and lateral views of the head were obtained, centered on the posterior fossa. The catheter was then retracted into the left external carotid artery. Frontal and lateral angiograms of the head were obtained. The catheter was then placed into the right common carotid artery. Frontal and lateral angiograms of the neck were obtained. Using biplane roadmap guidance, the catheter was placed into the right external carotid artery. Frontal and lateral angiograms of the head were obtained. Using biplane roadmap guidance, the catheter was placed into the right internal carotid artery. Frontal, lateral, magnified right anterior oblique and magnified lateral views of the head were obtained. The catheter was then placed into the right subclavian artery. Frontal angiograms of the neck were obtained. Using road map guidance, the catheter was placed into the right internal carotid artery frontal and lateral angiograms of the head were obtained. The catheter was subsequently withdrawn. Right common femoral artery angiogram was obtained in right anterior oblique view. The puncture is at the level of the common femoral artery. The artery has normal caliber, adequate for closure device. A 5 [place] Exoseal was utilized for access closure. Immediate hemostasis was achieved. FINDINGS: Left CCA angiograms: Calcified atherosclerotic changes of the left carotid bifurcation without hemodynamically significant stenosis. Left ICA angiograms: There is brisk vascular contrast filling of the left ACA and MCA vascular trees. There is also brisk contrast opacification the left PCA vascular tree (fetal PCA). Diffuse luminal irregularity of the intracranial left ICA, left MCA and left PCA consistent with intracranial atherosclerotic disease with multifocal areas of mild stenosis. There is moderate stenosis at the left P2/PCA, left A2-A3/ACA junction and left M3/MCA posterior division branch.  No aneurysms or abnormally high-flow, early draining veins are seen. No regions of abnormal hypervascularity are noted. The visualized dural sinuses are patent. However, there is significant drainage anteriorly via sphenoparietal sinus as well as via left occipital emissary veins. A focal extracranial dilatation of an emissary vein draining the left sigmoid sinus was identified and marked externally with assistance of roadmap. Patient confirmed that this corresponds to the location of pulsatile sound. This venous dilatation measures approximately 1.4 x 1.1 cm and drains into the posterior cervical region. Left ECA and occipital artery angiograms: No early venous drainage was noted. The visualized branches of the left external carotid artery are unremarkable. Right CCA angiograms: Prominently calcified atherosclerotic disease of the right carotid bifurcation without hemodynamically significant stenosis. Right ICA angiograms: There is brisk vascular contrast filling of the right ACA and MCA vascular trees. Prominent luminal irregularity along the right MCA and ACA vascular tree consistent with intracranial atherosclerotic disease. There is moderate at the distal right M1/MCA and right A3/ACA to prior angiogram. No aneurysms or abnormally high-flow, early draining veins are seen. No regions of abnormal hypervascularity are noted. The visualized dural sinuses are patent. Prominent vein branching from the right jugular vein at the skull base, as seen prior angiogram. Right ECA angiograms: No early venous drainage was noted. The visualized branches of the right external carotid artery are unremarkable. Right vertebral artery angiograms: Diffuse luminal irregularity of the intracranial right vertebral artery, basilar artery, right PCA and bilateral superior cerebellar arteries, consistent with intracranial atherosclerotic disease. The left posterior cerebral artery is not visualized due to direct  origin from the left ICA  (fetal PCA). Moderate stenosis is seen at the V4 segment of the right vertebral artery distal to the origin of the right PICA. The left vertebral artery is seen by contrast reflux and show severe stenosis at the vertebrobasilar junction. There is also severe stenosis at the P2 segment of the right PCA. No abnormally high-flow, early draining veins are seen. No regions of abnormal hypervascularity are noted. The visualized dural sinuses are patent. Right subclavian angiograms: Calcified atherosclerotic plaque is seen at the proximal right subclavian artery without hemodynamically significant stenosis. The origin of the right vertebral artery is not well visualized. Luminal irregularity is seen along are along all the V1 and V2 segments consistent with atherosclerotic disease without hemodynamically significant stenosis. Right common femoral artery angiograms: The access is at the level of the mid right common femoral artery. The femoral artery has normal caliber, adequate for closure device. PROCEDURE: No intervention performed. IMPRESSION: 1. Venous drainage of the intracranial circulation is patent with prominent anterior drainage via the sphenoparietal sinus on the left side, left occipital emissary veins and posterior cervical vein branching from the right jugular. Correlation prior CT angiogram showed prominent bilateral posterior belly of the digastric muscle resulting in compression of the jugular veins between the muscle and the internal carotid arteries. This is likely the cause of prominent venous drainage of the intracranial circulation via left sphenoparietal sinus and sigmoid/jugular drainage via emissary veins. The emissary vein draining the left sigmoid sinus shows a focal extracranial dilatation in the left occipital area that corresponds to the location where the patient feels in hear the pulsatile tinnitus. 2. No evidence of craniocervical arteriovenous fistula. 3. Severe intracranial  atherosclerotic disease, more pronounced in the posterior circulation, as detailed above. PLAN: Findings discussed with the patient after the procedure. He would like to proceed with endovascular embolization of the focal dilatation of emissary vein at the location of clinical concern. Electronically Signed   By: Matthew Cain M.D.   On: 09/08/2022 13:49   IR ANGIO VERTEBRAL SEL VERTEBRAL UNI R MOD SED  Result Date: 09/08/2022 INDICATION: Matthew Cain is a 66 year old male with past medical history significant for chronic diastolic heart failure, DM II, CVA, HTN and renal insufficiency. Pt c/o left sided pulsatile tinnitus that coincides with heartbeat that disrupts his sleep. 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 However, the images were degraded by motion. EXAM: ULTRASOUND-GUIDED VASCULAR ACCESS DIAGNOSTIC CEREBRAL ANGIOGRAM COMPARISON:  Cerebral angiogram June 03, 2021; CT angiogram of the head and neck May 23, 2021. MEDICATIONS: No antibiotics administered. ANESTHESIA/SEDATION: A total of fentanyl 25 mcg was administered intravenously by the radiology nurse. The patient's vital signs were monitored continuously by radiology nursing throughout the procedure under my direct supervision. CONTRAST:  100 mL of Omnipaque 300 mg/mL FLUOROSCOPY: Radiation Exposure Index (as provided by the fluoroscopic device): PSD 3,419 mGy Kerma COMPLICATIONS: None immediate. TECHNIQUE: Informed written consent was obtained from the patient after a thorough discussion of the procedural risks, benefits and alternatives. All questions were addressed. Maximal Sterile Barrier Technique was utilized including caps, mask, sterile gowns, sterile gloves, sterile drape, hand hygiene and skin antiseptic. A timeout was performed prior to the initiation of the procedure. The right groin was prepped and draped in the usual sterile fashion. Using a  micropuncture kit and the modified Seldinger technique, access was gained to the right common femoral artery and  a 5 French sheath was placed. Real-time ultrasound guidance was utilized for vascular access including the acquisition of a permanent ultrasound image documenting patency of the accessed vessel. Under fluoroscopy, a 5 [place] Berenstein 2 catheter was navigated over a 0.035" Terumo Glidewire into the aortic arch. The catheter was placed into the left common carotid artery. Frontal and lateral angiograms of the neck were obtained. Using biplane roadmap guidance [place] space into the left internal carotid. Frontal and lateral angiograms of the head obtained followed by magnified left anterior oblique and magnified Schuller's view of the head were obtained. Next, the catheter was placed into the right common carotid artery. On the biplane roadmap guidance, the catheter was placed into the left occipital artery magnified frontal and lateral views of the head were obtained, centered on the posterior fossa. The catheter was then retracted into the left external carotid artery. Frontal and lateral angiograms of the head were obtained. The catheter was then placed into the right common carotid artery. Frontal and lateral angiograms of the neck were obtained. Using biplane roadmap guidance, the catheter was placed into the right external carotid artery. Frontal and lateral angiograms of the head were obtained. Using biplane roadmap guidance, the catheter was placed into the right internal carotid artery. Frontal, lateral, magnified right anterior oblique and magnified lateral views of the head were obtained. The catheter was then placed into the right subclavian artery. Frontal angiograms of the neck were obtained. Using road map guidance, the catheter was placed into the right internal carotid artery frontal and lateral angiograms of the head were obtained. The catheter was subsequently withdrawn. Right  common femoral artery angiogram was obtained in right anterior oblique view. The puncture is at the level of the common femoral artery. The artery has normal caliber, adequate for closure device. A 5 [place] Exoseal was utilized for access closure. Immediate hemostasis was achieved. FINDINGS: Left CCA angiograms: Calcified atherosclerotic changes of the left carotid bifurcation without hemodynamically significant stenosis. Left ICA angiograms: There is brisk vascular contrast filling of the left ACA and MCA vascular trees. There is also brisk contrast opacification the left PCA vascular tree (fetal PCA). Diffuse luminal irregularity of the intracranial left ICA, left MCA and left PCA consistent with intracranial atherosclerotic disease with multifocal areas of mild stenosis. There is moderate stenosis at the left P2/PCA, left A2-A3/ACA junction and left M3/MCA posterior division branch. No aneurysms or abnormally high-flow, early draining veins are seen. No regions of abnormal hypervascularity are noted. The visualized dural sinuses are patent. However, there is significant drainage anteriorly via sphenoparietal sinus as well as via left occipital emissary veins. A focal extracranial dilatation of an emissary vein draining the left sigmoid sinus was identified and marked externally with assistance of roadmap. Patient confirmed that this corresponds to the location of pulsatile sound. This venous dilatation measures approximately 1.4 x 1.1 cm and drains into the posterior cervical region. Left ECA and occipital artery angiograms: No early venous drainage was noted. The visualized branches of the left external carotid artery are unremarkable. Right CCA angiograms: Prominently calcified atherosclerotic disease of the right carotid bifurcation without hemodynamically significant stenosis. Right ICA angiograms: There is brisk vascular contrast filling of the right ACA and MCA vascular trees. Prominent luminal irregularity  along the right MCA and ACA vascular tree consistent with intracranial atherosclerotic disease. There is moderate at the distal right M1/MCA and right A3/ACA to prior angiogram. No aneurysms or abnormally high-flow, early draining veins are seen. No regions of abnormal  hypervascularity are noted. The visualized dural sinuses are patent. Prominent vein branching from the right jugular vein at the skull base, as seen prior angiogram. Right ECA angiograms: No early venous drainage was noted. The visualized branches of the right external carotid artery are unremarkable. Right vertebral artery angiograms: Diffuse luminal irregularity of the intracranial right vertebral artery, basilar artery, right PCA and bilateral superior cerebellar arteries, consistent with intracranial atherosclerotic disease. The left posterior cerebral artery is not visualized due to direct origin from the left ICA (fetal PCA). Moderate stenosis is seen at the V4 segment of the right vertebral artery distal to the origin of the right PICA. The left vertebral artery is seen by contrast reflux and show severe stenosis at the vertebrobasilar junction. There is also severe stenosis at the P2 segment of the right PCA. No abnormally high-flow, early draining veins are seen. No regions of abnormal hypervascularity are noted. The visualized dural sinuses are patent. Right subclavian angiograms: Calcified atherosclerotic plaque is seen at the proximal right subclavian artery without hemodynamically significant stenosis. The origin of the right vertebral artery is not well visualized. Luminal irregularity is seen along are along all the V1 and V2 segments consistent with atherosclerotic disease without hemodynamically significant stenosis. Right common femoral artery angiograms: The access is at the level of the mid right common femoral artery. The femoral artery has normal caliber, adequate for closure device. PROCEDURE: No intervention performed.  IMPRESSION: 1. Venous drainage of the intracranial circulation is patent with prominent anterior drainage via the sphenoparietal sinus on the left side, left occipital emissary veins and posterior cervical vein branching from the right jugular. Correlation prior CT angiogram showed prominent bilateral posterior belly of the digastric muscle resulting in compression of the jugular veins between the muscle and the internal carotid arteries. This is likely the cause of prominent venous drainage of the intracranial circulation via left sphenoparietal sinus and sigmoid/jugular drainage via emissary veins. The emissary vein draining the left sigmoid sinus shows a focal extracranial dilatation in the left occipital area that corresponds to the location where the patient feels in hear the pulsatile tinnitus. 2. No evidence of craniocervical arteriovenous fistula. 3. Severe intracranial atherosclerotic disease, more pronounced in the posterior circulation, as detailed above. PLAN: Findings discussed with the patient after the procedure. He would like to proceed with endovascular embolization of the focal dilatation of emissary vein at the location of clinical concern. Electronically Signed   By: Matthew Cain M.D.   On: 09/08/2022 13:49    Treatments: cerebral angiogram with intervention  Discharge Exam: Blood pressure (!) 133/58, pulse 94, temperature 97.7 F (36.5 C), temperature source Axillary, resp. rate 20, height 5' 10.5" (1.791 m), weight 212 lb (96.2 kg), SpO2 96 %. Physical Exam Vitals and nursing note reviewed.  Constitutional:      General: He is not in acute distress.    Appearance: He is not ill-appearing.  HENT:     Head: Normocephalic.  Cardiovascular:     Rate and Rhythm: Normal rate.     Comments: (+) right CFA puncture site clean, dry, dressed appropriately. Soft, non tender, non pulsatile. Pulmonary:     Effort: Pulmonary effort is normal.  Abdominal:     Palpations:  Abdomen is soft.  Skin:    General: Skin is warm and dry.  Neurological:     General: No focal deficit present.     Mental Status: He is alert and oriented to person, place, and time.  Psychiatric:  Mood and Affect: Mood normal.        Behavior: Behavior normal.        Thought Content: Thought content normal.        Judgment: Judgment normal.     Disposition:  There are no questions and answers to display.         Allergies as of 09/29/2022       Reactions   Gabapentin Nausea And Vomiting   Tomato Rash        Medication List     TAKE these medications    ADVIL COLD/SINUS PO Take 1 tablet by mouth daily as needed (sinus headaches/ allergies).   amLODipine 10 MG tablet Commonly known as: NORVASC Take 1 tablet (10 mg total) by mouth every morning.   aspirin EC 81 MG tablet Take 1 tablet (81 mg total) by mouth daily. Swallow whole.   atorvastatin 80 MG tablet Commonly known as: LIPITOR Take 80 mg by mouth at bedtime.   BD Pen Needle Nano 2nd Gen 32G X 4 MM Misc Generic drug: Insulin Pen Needle USE WITH LANTUS SOLOSTAR TWICE DAILY   BinaxNOW COVID-19 Ag Home Test Kit Generic drug: COVID-19 At Home Antigen Test Use as Directed on the Package   busPIRone 5 MG tablet Commonly known as: BUSPAR Take 5 mg by mouth 3 (three) times daily.   carvedilol 25 MG tablet Commonly known as: COREG Take 1 tablet (25 mg total) by mouth 2 (two) times daily.   CLEAR EYES FOR DRY EYES OP Place 1 drop into both eyes daily as needed (dry eyes).   clopidogrel 75 MG tablet Commonly known as: PLAVIX Take 1 tablet (75 mg total) by mouth daily.   diclofenac Sodium 1 % Gel Commonly known as: VOLTAREN Apply 1 Application topically 4 (four) times daily as needed (pain).   DULoxetine 60 MG capsule Commonly known as: CYMBALTA Take 60 mg by mouth daily.   famotidine 40 MG tablet Commonly known as: PEPCID Take 40 mg by mouth at bedtime.   ferrous sulfate 325 (65 FE)  MG tablet Take 325 mg by mouth 2 (two) times daily.   Fish Oil 1000 MG Caps Take 1,000 mg by mouth at bedtime.   GARLIQUE PO Take 1 tablet by mouth at bedtime.   glipiZIDE 5 MG tablet Commonly known as: GLUCOTROL Take 10 mg by mouth 2 (two) times daily.   isosorbide mononitrate 30 MG 24 hr tablet Commonly known as: IMDUR Take 1 tablet (30 mg total) by mouth daily.   Levemir FlexTouch 100 UNIT/ML FlexPen Generic drug: insulin detemir Inject 65 Units into the skin 2 (two) times daily.   lisinopril 40 MG tablet Commonly known as: ZESTRIL Take 1 tablet (40 mg total) by mouth daily.   loratadine 10 MG tablet Commonly known as: CLARITIN Take 10 mg by mouth daily.   metFORMIN 1000 MG tablet Commonly known as: GLUCOPHAGE Take 1,000 mg by mouth 2 (two) times daily.   montelukast 10 MG tablet Commonly known as: SINGULAIR Take 10 mg by mouth at bedtime.   nitroGLYCERIN 0.4 MG SL tablet Commonly known as: NITROSTAT Place 1 tablet (0.4 mg total) under the tongue every 5 (five) minutes as needed for chest pain.   pantoprazole 40 MG tablet Commonly known as: PROTONIX Take 40 mg by mouth at bedtime.   spironolactone 25 MG tablet Commonly known as: ALDACTONE Take 1 tablet (25 mg total) by mouth daily.   ZzzQuil 50 MG/30ML Liqd Generic drug: diphenhydrAMINE HCl (Sleep)  Take 50 mg by mouth at bedtime as needed (sleep).          Electronically Signed: Joaquim Nam, PA-C 09/29/2022, 11:49 AM   I have spent Greater Than 30 Minutes discharging [employer].

## 2022-10-03 ENCOUNTER — Other Ambulatory Visit (HOSPITAL_COMMUNITY): Payer: Self-pay | Admitting: Neuroradiology

## 2022-10-03 ENCOUNTER — Encounter (HOSPITAL_COMMUNITY): Payer: Self-pay | Admitting: Neuroradiology

## 2022-10-03 DIAGNOSIS — Z9889 Other specified postprocedural states: Secondary | ICD-10-CM

## 2022-10-03 DIAGNOSIS — H93A2 Pulsatile tinnitus, left ear: Secondary | ICD-10-CM

## 2022-10-11 ENCOUNTER — Other Ambulatory Visit: Payer: Self-pay | Admitting: Cardiology

## 2022-10-12 ENCOUNTER — Ambulatory Visit (HOSPITAL_COMMUNITY)
Admission: RE | Admit: 2022-10-12 | Discharge: 2022-10-12 | Disposition: A | Discharge status: Home / Self Care | Payer: Medicare Other | Source: Ambulatory Visit | Attending: Neuroradiology | Admitting: Neuroradiology

## 2022-10-12 ENCOUNTER — Other Ambulatory Visit (HOSPITAL_COMMUNITY): Payer: Self-pay | Admitting: Radiology

## 2022-10-12 DIAGNOSIS — H93A2 Pulsatile tinnitus, left ear: Secondary | ICD-10-CM

## 2022-10-12 DIAGNOSIS — Z9889 Other specified postprocedural states: Secondary | ICD-10-CM

## 2022-10-12 NOTE — Progress Notes (Signed)
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  History of present illness:  Matthew Cain is a 66 year old male with past medical history significant for chronic diastolic heart failure, DM II, CVA, HTN and renal insufficiency. He complains of left sided pulsatile tinnitus that coincides with heartbeat that disrupts his sleep. He initially underwent a diagnostic cerebral angiogram on 06/03/2021. However, images were degraded by motion. 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 This was marked guided by fluoroscopy and patient confirmed it coincides with the location where he hears the pulsatile tinnitus.   UPDATE 10/12/2022: Matthew Cain underwent endovascular embolization of the left jugular bulb/emissary vein diverticulum without incident. On post op day 1, he referred complete resolution of symptoms. He returns today for follow-up. Matthew Cain says that the pulsatile sound in his left ear has disappeared. However, he says that now he has a low intensity "sizzling" sound on the left side of his head. Additionally, he feels it becomes pulsatile in the beginning of the afternoon before he takes his blood pressure medication, but improves a little after he takes the medication.  Past Medical History:  Diagnosis Date   Amputation finger    left ring finger   Anxiety    CAD (coronary artery disease)    LHC 10/12:  mild plaque in the LAD but no obstructive CAD   Chronic diastolic heart failure (HCC)    EF 19-16%, grade 1 diastolic dysfunction; no WMAs, echo, 11/2011; s/p pulmo. edema c/b VDRF 11/12; Echo 09/08/11: severe LVH, no SAM, no LVOT gradient, EF 55%, mild BAE, mild reduced RVSF.   CKD (chronic kidney disease)    Depression    Diabetes mellitus    Type II   GERD  (gastroesophageal reflux disease)    History of kidney stones    History of right ACA stroke    10/12  --  MRI 09/13/11:  Acute right ACA infarct involving corpus callosum.  MRA 09/13/11: severe intracranial ASD, right ACA occluded, left PCA and right MCA with severe disease.  Dopplers were neg for significant ICA stenosis   HLD (hyperlipidemia)    Hypertension    Renal insufficiency    Spermatocele    Thoracic aortic aneurysm (HCC)    CT in 10/12: 4.4 cm ascending thoracic aortic aneurysm    Past Surgical History:  Procedure Laterality Date    vasectomy     bilateral   CARDIAC CATHETERIZATION  09/09/11   IR ANGIO EXTERNAL CAROTID SEL EXT CAROTID BILAT MOD SED  09/07/2022   IR ANGIO EXTERNAL CAROTID SEL EXT CAROTID UNI R MOD SED  06/03/2021   IR ANGIO INTRA EXTRACRAN SEL COM CAROTID INNOMINATE UNI L MOD SED  06/03/2021   IR ANGIO INTRA EXTRACRAN SEL INTERNAL CAROTID BILAT MOD SED  09/07/2022   IR ANGIO INTRA EXTRACRAN SEL INTERNAL CAROTID UNI R MOD SED  06/03/2021   IR ANGIO VERTEBRAL SEL VERTEBRAL BILAT MOD SED  06/03/2021   IR ANGIO VERTEBRAL SEL VERTEBRAL UNI R MOD SED  09/08/2022   IR ANGIOGRAM FOLLOW UP STUDY  09/28/2022   IR ANGIOGRAM FOLLOW UP STUDY  09/28/2022   IR ANGIOGRAM FOLLOW UP STUDY  09/28/2022   IR ANGIOGRAM FOLLOW UP STUDY  09/28/2022   IR ANGIOGRAM FOLLOW UP STUDY  09/28/2022   IR  ANGIOGRAM FOLLOW UP STUDY  09/28/2022   IR ANGIOGRAM FOLLOW UP STUDY  09/28/2022   IR ANGIOGRAM FOLLOW UP STUDY  09/28/2022   IR ANGIOGRAM FOLLOW UP STUDY  09/28/2022   IR ANGIOGRAM FOLLOW UP STUDY  09/28/2022   IR CT HEAD LTD  09/28/2022   IR TRANSCATH/EMBOLIZ  09/28/2022   IR US GUIDE VASC ACCESS RIGHT  06/03/2021   IR US GUIDE VASC ACCESS RIGHT  09/07/2022   IR US GUIDE VASC ACCESS RIGHT  09/28/2022   IR US GUIDE VASC ACCESS RIGHT  09/28/2022   RADIOLOGY WITH ANESTHESIA N/A 09/28/2022   Procedure: Embolization of emissary vein;  Surgeon: Pedro Earls, MD;  Location:  [facility];  Service: Radiology;  Laterality: N/A;   ROTATOR CUFF REPAIR  07/2015   LEFT   SPERMATOCELECTOMY  1990's   RT side   SPERMATOCELECTOMY Left 07/20/2015   Procedure: LEFT HYDROCELECTOMY;  Surgeon: Irine Seal, MD;  Location: [facility];  Service: Urology;  Laterality: Left;   TRANSURETHRAL INCISION OF PROSTATE N/A 07/20/2015   Procedure: TRANSURETHRAL INCISION OF THE PROSTATE (TUIP);  Surgeon: Irine Seal, MD;  Location: [facility];  Service: Urology;  Laterality: N/A;    Allergies: Gabapentin and Tomato  Medications: Prior to Admission medications   Medication Sig Start Date End Date Taking? Authorizing Provider  amLODipine (NORVASC) 10 MG tablet Take 1 tablet (10 mg total) by mouth every morning. 09/28/21   Minus Breeding, MD  aspirin EC 81 MG tablet Take 1 tablet (81 mg total) by mouth daily. Swallow whole. 09/20/22   de Rosario Jacks, MD  atorvastatin (LIPITOR) 80 MG tablet Take 80 mg by mouth at bedtime. 05/23/21   [provider]  BD PEN NEEDLE NANO 2ND GEN 32G X 4 MM MISC USE WITH LANTUS SOLOSTAR TWICE DAILY 07/01/21   [provider]  Bartolo Darter COVID-19 AG HOME TEST KIT Use as Directed on the Package 06/16/21   [provider]  busPIRone (BUSPAR) 5 MG tablet Take 5 mg by mouth 3 (three) times daily. 08/14/22   [provider]  Carboxymethylcellul-Glycerin (CLEAR EYES FOR DRY EYES OP) Place 1 drop into both eyes daily as needed (dry eyes).    [provider]  carvedilol (COREG) 25 MG tablet Take 1 tablet (25 mg total) by mouth 2 (two) times daily. NEED OV. 10/11/22   Minus Breeding, MD  clopidogrel (PLAVIX) 75 MG tablet Take 1 tablet (75 mg total) by mouth daily. 09/19/22   Garvin Fila, MD  diclofenac Sodium (VOLTAREN) 1 % GEL Apply 1 Application topically 4 (four) times daily as needed (pain).    [provider]  diphenhydrAMINE HCl, Sleep, (ZZZQUIL) 50 MG/30ML LIQD Take 50 mg by mouth at bedtime as needed (sleep).    [provider]  DULoxetine (CYMBALTA) 60 MG capsule Take 60 mg by mouth daily. 10/04/18   [provider]  famotidine (PEPCID) 40 MG tablet Take 40 mg by mouth at bedtime. 11/12/19   [provider]  ferrous sulfate 325 (65 FE) MG tablet Take 325 mg by mouth 2 (two) times daily.    [provider]  Garlic (GARLIQUE PO) Take 1 tablet by mouth at bedtime.    [provider]  glipiZIDE (GLUCOTROL) 5 MG tablet Take 10 mg by mouth 2 (two) times daily. 07/29/22   [provider]  insulin detemir (LEVEMIR FLEXTOUCH) 100 UNIT/ML FlexPen Inject 65 Units into the skin 2 (two) times daily. 06/24/21   [provider]  isosorbide mononitrate (IMDUR) 30 MG 24 hr tablet Take 1 tablet (30 mg total) by mouth daily. 09/28/21   Minus Breeding, MD  lisinopril (ZESTRIL) 40 MG tablet Take 1 tablet (40 mg total) by mouth daily. 09/28/21   Minus Breeding, MD  loratadine (CLARITIN) 10 MG tablet Take 10 mg by mouth daily.    [provider]  metFORMIN (GLUCOPHAGE) 1000 MG tablet Take 1,000 mg by mouth 2 (two) times daily.    [provider]  montelukast (SINGULAIR) 10 MG tablet Take 10 mg by mouth at bedtime. 04/26/21   [provider]  nitroGLYCERIN (NITROSTAT) 0.4 MG SL tablet Place 1 tablet (0.4 mg total) under the tongue every 5 (five) minutes as needed for chest pain. 11/20/19   Herminio Commons, MD  Omega-3 Fatty Acids (FISH OIL) 1000 MG CAPS Take 1,000 mg by mouth at bedtime.    [provider]  pantoprazole (PROTONIX) 40 MG tablet Take 40 mg by mouth at bedtime. 01/02/22   [provider]  Pseudoephedrine-Ibuprofen (ADVIL COLD/SINUS PO) Take 1 tablet by mouth daily as needed (sinus headaches/ allergies).    [provider]  spironolactone (ALDACTONE) 25 MG tablet Take 1 tablet (25 mg total) by mouth daily. 09/28/21   Minus Breeding, MD     Family History  Problem Relation Age of Onset   Heart attack Mother     Cerebral aneurysm Father    Cerebral aneurysm Other    Diabetes Other    Hypertension Other     Social History   Socioeconomic History   Marital status: Divorced    Spouse name: Not on file   Number of children: Not on file   Years of education: Not on file   Highest education level: Not on file  Occupational History   Not on file  Tobacco Use   Smoking status: Former    Packs/day: 1.00    Years: 30.00    Total pack years: 30.00    Types: Cigarettes    Quit date: 11/13/1994    Years since quitting: 27.9   Smokeless tobacco: Never  Vaping Use   Vaping Use: Never used  Substance and Sexual Activity   Alcohol use: Yes    Comment: Occasional   Drug use: No   Sexual activity: Not Currently  Other Topics Concern   Not on file  Social History Narrative   Lives alone   Right Handed   Drinks 2-3 cups caffeine daily   Social Determinants of Health   Financial Resource Strain: Not on file  Food Insecurity: Not on file  Transportation Needs: Not on file  Physical Activity: Not on file  Stress: Not on file  Social Connections: Not on file     Vital Signs: There were no vitals taken for this visit.  Physical Exam Constitutional:      Appearance: Normal appearance.  HENT:     Head: Normocephalic and atraumatic.  Eyes:     Extraocular Movements: Extraocular movements intact.     Conjunctiva/sclera: Conjunctivae normal.     Pupils: Pupils are equal, round, and reactive to light.  Pulmonary:     Effort: Pulmonary effort is normal.  Neurological:     General: No focal deficit present.     Mental Status: He is alert and oriented to person, place, and time.     Imaging: No results found.  Labs:  CBC: Recent Labs    11/28/21 1555 06/13/22 0307 06/13/22 5681 09/07/22 2751  09/28/22 0639  WBC 6.2 7.9  --  5.3 5.0  HGB 11.0* 5.4* 6.5* 9.6* 9.1*  HCT 35.0* 20.3* 19.0* 29.9* 27.9*  PLT 406* 593*  --  383 355    COAGS: Recent Labs    09/07/22 0819  09/28/22 0639  INR 1.1 1.1    BMP: Recent Labs    11/28/21 1555 06/13/22 0307 06/13/22 0605 09/07/22 0819 09/28/22 0639  NA 132* 137 139 137 131*  K 3.9 3.8 3.8 4.0 3.9  CL 95* 105 103 102 99  CO2 25 23  --  25 22  GLUCOSE 404* 169* 136* 257* 211*  BUN _0 CALCIUM 9.0 9.6  --  9.2 8.6*  CREATININE 1.57* 1.33* 1.20 1.32* 1.16  GFRNONAA 48* 59*  --  59* >60    LIVER FUNCTION TESTS: Recent Labs    06/13/22 0307  BILITOT <0.1*  AST 19  ALT 17  ALKPHOS 53  PROT 6.8  ALBUMIN 4.0    Assessment:  Matthew Cain did well postprocedure without any complication.  He refers resolution of the pulsatile sound in his left ear.  He does refer, however, a low level sizzling sound on the left side of his head that becomes pulsatile at the beginning of the afternoon, before he takes his blood pressure medication.  A little after he takes blood pressure medication, the pulsatile sound fades.  Head head requested him to take note of his blood pressure in the afternoon before he takes his medication and later in the afternoon and that he takes this with him for has appointment with his primary care physician next week in case any medication adjustment is necessary.  He does not have any other additional angiographic cause for his tinnitus, although we could before sinus pressure measurements in case this persists after optimization of his blood pressure.  I also suggested he see an ENT for further evaluation.     Signed: Pedro Earls, MD 10/12/2022, 1:28 PM    I spent a total of    25 Minutes in face to face in clinical consultation, greater than 50% of which was counseling/coordinating care for tinnitus, left ear.

## 2022-10-17 ENCOUNTER — Encounter: Payer: Self-pay | Admitting: Nurse Practitioner

## 2022-10-17 ENCOUNTER — Ambulatory Visit: Payer: Medicare Other | Attending: Nurse Practitioner | Admitting: Nurse Practitioner

## 2022-10-17 VITALS — BP 130/70 | HR 83 | Ht 70.5 in | Wt 223.6 lb

## 2022-10-17 DIAGNOSIS — I2510 Atherosclerotic heart disease of native coronary artery without angina pectoris: Secondary | ICD-10-CM

## 2022-10-17 DIAGNOSIS — Z79899 Other long term (current) drug therapy: Secondary | ICD-10-CM

## 2022-10-17 DIAGNOSIS — H93A2 Pulsatile tinnitus, left ear: Secondary | ICD-10-CM

## 2022-10-17 DIAGNOSIS — I517 Cardiomegaly: Secondary | ICD-10-CM

## 2022-10-17 DIAGNOSIS — I712 Thoracic aortic aneurysm, without rupture, unspecified: Secondary | ICD-10-CM

## 2022-10-17 DIAGNOSIS — E785 Hyperlipidemia, unspecified: Secondary | ICD-10-CM | POA: Diagnosis not present

## 2022-10-17 DIAGNOSIS — H9312 Tinnitus, left ear: Secondary | ICD-10-CM

## 2022-10-17 DIAGNOSIS — I422 Other hypertrophic cardiomyopathy: Secondary | ICD-10-CM | POA: Diagnosis not present

## 2022-10-17 DIAGNOSIS — H9311 Tinnitus, right ear: Secondary | ICD-10-CM

## 2022-10-17 DIAGNOSIS — I5189 Other ill-defined heart diseases: Secondary | ICD-10-CM | POA: Diagnosis not present

## 2022-10-17 DIAGNOSIS — I10 Essential (primary) hypertension: Secondary | ICD-10-CM

## 2022-10-17 DIAGNOSIS — I5032 Chronic diastolic (congestive) heart failure: Secondary | ICD-10-CM

## 2022-10-17 NOTE — Patient Instructions (Addendum)
Medication Instructions:  Continue all current medications.   Labwork: FLP, CMET, TSH, MG - orders given today Reminder:  Nothing to eat or drink after 12 midnight prior to labs.  Testing/Procedures: Your physician has requested that you have a carotid duplex. This test is an ultrasound of the carotid arteries in your neck. It looks at blood flow through these arteries that supply the brain with blood. Allow one hour for this exam. There are no restrictions or special instructions.  Your physician has requested that you have an echocardiogram. Echocardiography is a painless test that uses sound waves to create images of your heart. It provides your doctor with information about the size and shape of your heart and how well your heart's chambers and valves are working. This procedure takes approximately one hour. There are no restrictions for this procedure. Please do NOT wear cologne, perfume, aftershave, or lotions (deodorant is allowed). Please arrive 15 minutes prior to your appointment time.   Follow-Up: Office will contact with results via phone, letter or mychart.    2 months   Any Other Special Instructions Will Be Listed Below (If Applicable). You have been referred to ENT   If you need a refill on your cardiac medications before your next appointment, please call your pharmacy.

## 2022-10-17 NOTE — Progress Notes (Addendum)
Cardiology Office Note:    Date:  10/17/2022  ID:  Matthew Cain, DOB [birth year], MRN [id number]  PCP:  System, Provider Not In   [place] [facility] Cardiologist:  Minus Breeding, MD     Referring MD: No ref. provider found   CC: Low diastolic BP and tinnititus  History of Present Illness:    Matthew Cain is a 66 y.o. 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  He previously saw Dr. Bronson Ing.  In 2012 he was noted to have mild chest pain with mild CAD as seen on cardiac catheterization.  In 2020 he underwent a nuclear stress test that was negative for ischemia.  Echocardiogram last year revealed normal EF with severe septal hypertrophy, grade 1 DD, was slightly enlarged aortic root.  Cardiac MRI did not reveal septal hypertrophy.   Last seen by Dr. Percival Spanish on September 28, 2021.  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  Overall was doing well from a cardiac perspective and denied any new complaints.  Presented to [facility] in November 2023 for outpatient planned cerebral angiogram with endovascular embolization of focal dilation of emissary vein. No complications post surgery and was told to follow up outpatient.   Today he presents at the request of his neurologist.  He states after his surgery last month, he is getting ringing in his ears that his neurologist says is coming from his inner ear, however has been having low diastolic BP readings that his neurologist stated he need to see his cardiologist for.  Recent BP at home has been 130 over mid to low 60s.  Did state in the  past he was scheduled to do colonoscopy about 6 weeks ago, but needed some blood transfusion and is now taking iron pills twice daily.  He is overall doing well from a cardiac perspective denies any chest pain, shortness of breath, palpitations, syncope, presyncope, dizziness, orthopnea, PND, swelling, significant weight changes, acute bleeding, or claudication.  Denies any other questions or concerns today.  Past Medical History:  Diagnosis Date   Amputation finger    left ring finger   Anxiety    CAD (coronary artery disease)    LHC 10/12:  mild plaque in the LAD but no obstructive CAD   Chronic diastolic heart failure (HCC)    EF 00-71%, grade 1 diastolic dysfunction; no WMAs, echo, 11/2011; s/p pulmo. edema c/b VDRF 11/12; Echo 09/08/11: severe LVH, no SAM, no LVOT gradient, EF 55%, mild BAE, mild reduced RVSF.   CKD (chronic kidney disease)    Depression    Diabetes mellitus    Type II   GERD (gastroesophageal reflux disease)    History of kidney stones    History of right ACA stroke    10/12  --  MRI 09/13/11:  Acute right ACA infarct involving corpus callosum.  MRA 09/13/11: severe intracranial ASD, right ACA occluded, left PCA and right MCA with severe disease.  Dopplers were neg for significant ICA stenosis   HLD (hyperlipidemia)    Hypertension    Renal insufficiency    Spermatocele    Thoracic aortic aneurysm ([place])  CT in 10/12: 4.4 cm ascending thoracic aortic aneurysm    Past Surgical History:  Procedure Laterality Date    vasectomy     bilateral   CARDIAC CATHETERIZATION  09/09/11   IR ANGIO EXTERNAL CAROTID SEL EXT CAROTID BILAT MOD SED  09/07/2022   IR ANGIO EXTERNAL CAROTID SEL EXT CAROTID UNI R MOD SED  06/03/2021   IR ANGIO INTRA EXTRACRAN SEL COM CAROTID INNOMINATE UNI L MOD SED  06/03/2021   IR ANGIO INTRA EXTRACRAN SEL INTERNAL CAROTID BILAT MOD SED  09/07/2022   IR ANGIO INTRA EXTRACRAN SEL INTERNAL CAROTID UNI R MOD SED  06/03/2021   IR ANGIO VERTEBRAL SEL  VERTEBRAL BILAT MOD SED  06/03/2021   IR ANGIO VERTEBRAL SEL VERTEBRAL UNI R MOD SED  09/08/2022   IR ANGIOGRAM FOLLOW UP STUDY  09/28/2022   IR ANGIOGRAM FOLLOW UP STUDY  09/28/2022   IR ANGIOGRAM FOLLOW UP STUDY  09/28/2022   IR ANGIOGRAM FOLLOW UP STUDY  09/28/2022   IR ANGIOGRAM FOLLOW UP STUDY  09/28/2022   IR ANGIOGRAM FOLLOW UP STUDY  09/28/2022   IR ANGIOGRAM FOLLOW UP STUDY  09/28/2022   IR ANGIOGRAM FOLLOW UP STUDY  09/28/2022   IR ANGIOGRAM FOLLOW UP STUDY  09/28/2022   IR ANGIOGRAM FOLLOW UP STUDY  09/28/2022   IR CT HEAD LTD  09/28/2022   IR TRANSCATH/EMBOLIZ  09/28/2022   IR US GUIDE VASC ACCESS RIGHT  06/03/2021   IR US GUIDE VASC ACCESS RIGHT  09/07/2022   IR US GUIDE VASC ACCESS RIGHT  09/28/2022   IR US GUIDE VASC ACCESS RIGHT  09/28/2022   RADIOLOGY WITH ANESTHESIA N/A 09/28/2022   Procedure: Embolization of emissary vein;  Surgeon: Pedro Earls, MD;  Location: [facility];  Service: Radiology;  Laterality: N/A;   ROTATOR CUFF REPAIR  07/2015   LEFT   SPERMATOCELECTOMY  1990's   RT side   SPERMATOCELECTOMY Left 07/20/2015   Procedure: LEFT HYDROCELECTOMY;  Surgeon: Irine Seal, MD;  Location: [facility];  Service: Urology;  Laterality: Left;   TRANSURETHRAL INCISION OF PROSTATE N/A 07/20/2015   Procedure: TRANSURETHRAL INCISION OF THE PROSTATE (TUIP);  Surgeon: Irine Seal, MD;  Location: [facility];  Service: Urology;  Laterality: N/A;    Current Medications: Current Meds  Medication Sig   amLODipine (NORVASC) 10 MG tablet Take 1 tablet (10 mg total) by mouth every morning.   aspirin EC 81 MG tablet Take 1 tablet (81 mg total) by mouth daily. Swallow whole.   atorvastatin (LIPITOR) 80 MG tablet Take 80 mg by mouth at bedtime.   BD PEN NEEDLE NANO 2ND GEN 32G X 4 MM MISC USE WITH LANTUS SOLOSTAR TWICE DAILY   BINAXNOW COVID-19 AG HOME TEST KIT Use as Directed on the Package   busPIRone (BUSPAR) 5 MG tablet Take 5 mg by mouth 3 (three) times daily.    Carboxymethylcellul-Glycerin (CLEAR EYES FOR DRY EYES OP) Place 1 drop into both eyes daily as needed (dry eyes).   carvedilol (COREG) 25 MG tablet Take 1 tablet (25 mg total) by mouth 2 (two) times daily. NEED OV.   clopidogrel (PLAVIX) 75 MG tablet Take 1 tablet (75 mg total) by mouth daily.   diclofenac Sodium (VOLTAREN) 1 % GEL Apply 1 Application topically 4 (four) times daily as needed (pain).   diphenhydrAMINE HCl, Sleep, (ZZZQUIL) 50 MG/30ML LIQD Take 50 mg by mouth at bedtime as needed (sleep).   DULoxetine (CYMBALTA) 60 MG capsule Take 60 mg by  mouth daily.   famotidine (PEPCID) 40 MG tablet Take 40 mg by mouth at bedtime.   ferrous sulfate 325 (65 FE) MG tablet Take 325 mg by mouth 2 (two) times daily.   Garlic (GARLIQUE PO) Take 1 tablet by mouth at bedtime.   glipiZIDE (GLUCOTROL) 5 MG tablet Take 10 mg by mouth 2 (two) times daily.   insulin detemir (LEVEMIR FLEXTOUCH) 100 UNIT/ML FlexPen Inject 65 Units into the skin 2 (two) times daily.   isosorbide mononitrate (IMDUR) 30 MG 24 hr tablet Take 1 tablet (30 mg total) by mouth daily.   lisinopril (ZESTRIL) 40 MG tablet Take 1 tablet (40 mg total) by mouth daily.   loratadine (CLARITIN) 10 MG tablet Take 10 mg by mouth daily.   metFORMIN (GLUCOPHAGE) 1000 MG tablet Take 1,000 mg by mouth 2 (two) times daily.   montelukast (SINGULAIR) 10 MG tablet Take 10 mg by mouth at bedtime.   nitroGLYCERIN (NITROSTAT) 0.4 MG SL tablet Place 1 tablet (0.4 mg total) under the tongue every 5 (five) minutes as needed for chest pain.   Omega-3 Fatty Acids (FISH OIL) 1000 MG CAPS Take 1,000 mg by mouth at bedtime.   pantoprazole (PROTONIX) 40 MG tablet Take 40 mg by mouth at bedtime.   Pseudoephedrine-Ibuprofen (ADVIL COLD/SINUS PO) Take 1 tablet by mouth daily as needed (sinus headaches/ allergies).   [DISCONTINUED] spironolactone (ALDACTONE) 25 MG tablet Take 1 tablet (25 mg total) by mouth daily.     Allergies:   Gabapentin and Tomato   Social  History   Socioeconomic History   Marital status: Divorced    Spouse name: Not on file   Number of children: Not on file   Years of education: Not on file   Highest education level: Not on file  Occupational History   Not on file  Tobacco Use   Smoking status: Former    Packs/day: 1.00    Years: 30.00    Total pack years: 30.00    Types: Cigarettes    Quit date: 11/13/1994    Years since quitting: 27.9   Smokeless tobacco: Never  Vaping Use   Vaping Use: Never used  Substance and Sexual Activity   Alcohol use: Yes    Comment: Occasional   Drug use: No   Sexual activity: Not Currently  Other Topics Concern   Not on file  Social History Narrative   Lives alone   Right Handed   Drinks 2-3 cups caffeine daily   Social Determinants of Health   Financial Resource Strain: Not on file  Food Insecurity: Not on file  Transportation Needs: Not on file  Physical Activity: Not on file  Stress: Not on file  Social Connections: Not on file     Family History: The patient's family history includes Cerebral aneurysm in his father and another family member; Diabetes in an other family member; Heart attack in his mother; Hypertension in an other family member.  ROS:   Review of Systems  Constitutional: Negative.   HENT:  Positive for tinnitus. Negative for congestion, ear discharge, ear pain, hearing loss, nosebleeds, sinus pain and sore throat.   Eyes: Negative.   Respiratory: Negative.  Negative for stridor.   Cardiovascular: Negative.   Gastrointestinal: Negative.   Genitourinary: Negative.   Musculoskeletal: Negative.   Skin: Negative.   Neurological: Negative.   Endo/Heme/Allergies: Negative.   Psychiatric/Behavioral: Negative.      Please see the history of present illness.    All other systems reviewed  and are negative.  EKGs/Labs/Other Studies Reviewed:    The following studies were reviewed today:   EKG:  EKG is not ordered today.   Cardiac monitor  09/2021: Patch Wear Time: 2 days and 22 hours (2022-11-19T18:11:06-499 to 2022-11-22T16:52:57-0500) Patient had a min HR of 68 bpm, max HR of 162 bpm, and avg HR of 85 bpm. Predominant underlying rhythm was Sinus Rhythm. First Degree AV Block was present. Intermittent Bundle Branch Block was present. QRS morphology changes were present throughout recording. 4 Supraventricular Tachycardia runs occurred, the run with the fastest interval lasting 10 beats with a max rate of 162 bpm (avg 152 bpm); the run with the fastest interval was also the longest. Isolated SVEs were occasional (4.1%, 14733), SVE Couplets were rare (<1.0%, 107), and SVE Triplets were rare (<1.0%, 37). Isolated VEs were rare (<1.0%), VE Couplets were rare (<1.0%), and no VE Triplets were present.   Normal sinus rhythm Runs of supraventricular tachycardia The longest run was 10 beats. No sustained arrhythmias   Cardiac MRI on August 27, 2020: 1. Normal left ventricular size with severe basal septal hypertrophy (18 mm) and moderate hypertrophy of the remaining myocardium (13 mm) with normal systolic function (LVEF = 64%). There are no regional Teegarden motion abnormalities and no late gadolinium enhancement in the left ventricular myocardium. There is no SAM and no LVOT obstruction.   2. Normal right ventricular size, thickness and systolic function (LVEF = 50%). There are no regional Boch motion abnormalities.   3. Normal left and right atrial size.   4. Mild ascending aortic aneurysm with maximum diameter 42 mm.   5. Mildly dilated pulmonary artery measuring 33 mm.   6. No significant valvular abnormalities.   There is no evidence for a hypertrophic cardiomyopathy but rather a hypertensive heart disease.  Lexiscan on November 27, 2018: No diagnostic ST segment changes to indicate ischemia. Small, mild intensity, partially reversible inferior defect most prominent on rest imaging and therefore suggestive of variable soft  tissue attenuation. No definitive ischemic defects. This is a low risk study. Nuclear stress EF: 49%. Visually, LV contraction appears normal.  2D echocardiogram on November 26, 2018: Study Conclusions   - Left ventricle: The cavity size was normal. Systolic function was    normal. The estimated ejection fraction was in the range of 60%    to 65%. Soliman motion was normal; there were no regional Strom    motion abnormalities. Doppler parameters are consistent with    abnormal left ventricular relaxation (grade 1 diastolic    dysfunction). Doppler parameters are consistent with high    ventricular filling pressure. Severe focal basal septal    hypertrophy and otherwise moderate hypertrophy of remaining    myocardium.  - Aortic valve: Trileaflet; mildly thickened leaflets.  - Mitral valve: Moderately calcified annulus.   Recent Labs: 09/28/2022: Hemoglobin 9.1; Platelets 355 10/18/2022: ALT 21; BUN 19; Creatinine, Ser 1.16; Magnesium 2.0; Potassium 4.4; Sodium 138; TSH 1.268  Recent Lipid Panel    Component Value Date/Time   CHOL 110 10/18/2022 0925   CHOL 87 (L) 08/05/2021 1021   TRIG 172 (H) 10/18/2022 0925   HDL 36 (L) 10/18/2022 0925   HDL 32 (L) 08/05/2021 1021   CHOLHDL 3.1 10/18/2022 0925   VLDL 34 10/18/2022 0925   LDLCALC 40 10/18/2022 0925   LDLCALC 22 08/05/2021 1021        Physical Exam:    VS:  BP 130/70   Pulse 83   Ht 5' 10.5" (  1.791 m)   Wt 223 lb 9.6 oz (101.4 kg)   SpO2 96%   BMI 31.63 kg/m     Wt Readings from Last 3 Encounters:  10/17/22 223 lb 9.6 oz (101.4 kg)  09/28/22 212 lb (96.2 kg)  09/07/22 210 lb (95.3 kg)     GEN: Well nourished, well developed 66 year old male in no acute distress HEENT: Normal NECK: No JVD; No carotid bruits CARDIAC: S1/S2, RRR, no murmurs, rubs, gallops; 2+ peripheral pulses throughout, strong and equal bilaterally RESPIRATORY:  Clear and diminished to auscultation without rales, wheezing or rhonchi  MUSCULOSKELETAL:   No edema; amputation to left ring finger, otherwise normal SKIN: Warm and dry NEUROLOGIC:  Alert and oriented x 3 PSYCHIATRIC:  Normal affect   ASSESSMENT:    1. Coronary artery disease involving native heart without angina pectoris, unspecified vessel or lesion type   2. Hyperlipidemia, unspecified hyperlipidemia type   3. Asymmetric septal hypertrophy (HCC)   4. Diastolic dysfunction   5. Chronic diastolic CHF (congestive heart failure) ([place])   6. Essential hypertension   7. Thoracic aortic aneurysm without rupture, unspecified part ([place])   8. Medication management   9. Pulsatile tinnitus of both ears    PLAN:    In order of problems listed above:  CAD Lexiscan in 2020 was considered low risk and did not indicate ischemia. Stable with no anginal symptoms. No indication for ischemic evaluation.  Continue aspirin, Lipitor, carvedilol, Plavix, Imdur, lisinopril, omega-3 fatty acid. Will obtain FLP and CMET within the following week or two. Heart healthy diet and regular cardiovascular exercise encouraged.   2. HLD, medication management Last labs in September 2022 revealed total cholesterol 87, HDL 32, LDL 22, and triglycerides 212.  Will repeat FLP and CMET the following week or two.  Continue current medication regimen. Heart healthy diet and regular cardiovascular exercise encouraged.   3. Asymmetric septal hypertrophy, history of diastolic dysfunction TTE in 2020 revealed EF 60 to 65%, severe focal basal septal hypertrophy and otherwise moderate hypertrophy of remaining myocardium, grade 1 DD noted.  Cardiac MRI revealed severe basal septal hypertrophy and moderate hypertrophy of remaining myocardium with normal systolic function, no RWMA there was no SAM or LVOT obstruction, no significant valvular abnormalities, there was no evidence for hypertrophic cardiomyopathy rather hypertensive heart disease.  Discussed monitoring BP.  He has been logging his BP. Continue current medication  regimen. Will update TTE at this time. Heart healthy diet and regular cardiovascular exercise encouraged.   4. Chronic diastolic heart failure TTE in 2020 revealed normal EF, grade 1 DD. Euvolemic and well compensated on exam. Low sodium diet, fluid restriction <2L, and daily weights encouraged. Educated to contact our office for weight gain of 2 lbs overnight or 5 lbs in one week.  Continue current medication regimen. Heart healthy diet and regular cardiovascular exercise encouraged.   5. Hypertension Blood pressure today 130/70.  Does admit to recent low diastolic BP, around low 15V.  Discussed AHA guidelines regarding normal systolic and diastolic BP.  Continue current medication regimen. Discussed to monitor BP at home at least 2 hours after medications and sitting for 5-10 minutes. He is logging his BP and will bring log to next OV. Heart healthy diet and regular cardiovascular exercise encouraged. Will obtain CMET in the next week or two.   6. Thoracic aortic aneurysm Cardiac MRI in 2021 revealed mildly dilated ascending aortic aneurysm with maximum diameter of 42 mm, dilated pulmonary artery measuring at 49m.  Will update  TTE at this time as mentioned above.  Continue current medication regimen. Heart healthy diet and regular cardiovascular exercise encouraged.   7. Pulsatile tinnitus Continues to note tinnitus, mainly along left ear, but along both.  Etiology uncertain. Will arrange carotid Dopplers and obtain TSH and magnesium.  Will refer to ENT.  8. Disposition: Follow-up with me or another APP in 2 months or sooner if anything changes.       Medication Adjustments/Labs and Tests Ordered: Current medicines are reviewed at length with the patient today.  Concerns regarding medicines are outlined above.  Orders Placed This Encounter  Procedures   Lipid panel   Comprehensive metabolic panel   TSH   Magnesium   Ambulatory referral to ENT   ECHOCARDIOGRAM COMPLETE   VAS US CAROTID    No orders of the defined types were placed in this encounter.   Patient Instructions  Medication Instructions:  Continue all current medications.   Labwork: FLP, CMET, TSH, MG - orders given today Reminder:  Nothing to eat or drink after 12 midnight prior to labs.  Testing/Procedures: Your physician has requested that you have a carotid duplex. This test is an ultrasound of the carotid arteries in your neck. It looks at blood flow through these arteries that supply the brain with blood. Allow one hour for this exam. There are no restrictions or special instructions.  Your physician has requested that you have an echocardiogram. Echocardiography is a painless test that uses sound waves to create images of your heart. It provides your doctor with information about the size and shape of your heart and how well your heart's chambers and valves are working. This procedure takes approximately one hour. There are no restrictions for this procedure. Please do NOT wear cologne, perfume, aftershave, or lotions (deodorant is allowed). Please arrive 15 minutes prior to your appointment time.   Follow-Up: Office will contact with results via phone, letter or mychart.    2 months   Any Other Special Instructions Will Be Listed Below (If Applicable). You have been referred to ENT   If you need a refill on your cardiac medications before your next appointment, please call your pharmacy.   SignedFinis Bud, NP  11/02/2022 11:46 AM    [place]

## 2022-10-18 ENCOUNTER — Other Ambulatory Visit (HOSPITAL_COMMUNITY)
Admission: RE | Admit: 2022-10-18 | Discharge: 2022-10-18 | Disposition: A | Discharge status: Home / Self Care | Payer: Medicare Other | Source: Ambulatory Visit | Attending: Nurse Practitioner | Admitting: Nurse Practitioner

## 2022-10-18 DIAGNOSIS — E785 Hyperlipidemia, unspecified: Secondary | ICD-10-CM | POA: Diagnosis present

## 2022-10-18 DIAGNOSIS — Z79899 Other long term (current) drug therapy: Secondary | ICD-10-CM | POA: Diagnosis present

## 2022-10-18 DIAGNOSIS — I10 Essential (primary) hypertension: Secondary | ICD-10-CM | POA: Diagnosis present

## 2022-10-18 DIAGNOSIS — I5032 Chronic diastolic (congestive) heart failure: Secondary | ICD-10-CM | POA: Insufficient documentation

## 2022-10-18 LAB — LIPID PANEL
Cholesterol: 110 mg/dL (ref 0–200)
HDL: 36 mg/dL — ABNORMAL LOW (ref >40)
LDL Cholesterol: 40 mg/dL (ref 0–99)
Total CHOL/HDL Ratio: 3.1 RATIO
Triglycerides: 172 mg/dL — ABNORMAL HIGH (ref <150)
VLDL: 34 mg/dL (ref 0–40)

## 2022-10-18 LAB — COMPREHENSIVE METABOLIC PANEL
ALT: 21 U/L (ref 0–44)
AST: 18 U/L (ref 15–41)
Albumin: 4 g/dL (ref 3.5–5.0)
Alkaline Phosphatase: 52 U/L (ref 38–126)
Anion gap: 9 (ref 5–15)
BUN: 19 mg/dL (ref 8–23)
CO2: 26 mmol/L (ref 22–32)
Calcium: 9.1 mg/dL (ref 8.9–10.3)
Chloride: 103 mmol/L (ref 98–111)
Creatinine, Ser: 1.16 mg/dL (ref 0.61–1.24)
GFR, Estimated: >60 mL/min (ref >60)
Glucose, Bld: 154 mg/dL — ABNORMAL HIGH (ref 70–99)
Potassium: 4.4 mmol/L (ref 3.5–5.1)
Sodium: 138 mmol/L (ref 135–145)
Total Bilirubin: 0.3 mg/dL (ref 0.3–1.2)
Total Protein: 7.1 g/dL (ref 6.5–8.1)

## 2022-10-18 LAB — MAGNESIUM: Magnesium: 2 mg/dL (ref 1.7–2.4)

## 2022-10-18 LAB — TSH: TSH: 1.268 u[IU]/mL (ref 0.350–4.500)

## 2022-10-20 ENCOUNTER — Telehealth: Payer: Self-pay | Admitting: Cardiology

## 2022-10-20 NOTE — Telephone Encounter (Signed)
Patient called to let NP Philis Nettle and Dr. Antoine Poche know that he has scheduled with ENT.

## 2022-10-20 NOTE — Telephone Encounter (Signed)
Attempted to return call to patient. Will forward to MD/NP to make aware.

## 2022-10-21 ENCOUNTER — Other Ambulatory Visit: Payer: Self-pay | Admitting: Cardiology

## 2022-10-30 ENCOUNTER — Ambulatory Visit: Payer: Medicare Other | Attending: Nurse Practitioner

## 2022-10-30 ENCOUNTER — Ambulatory Visit (INDEPENDENT_AMBULATORY_CARE_PROVIDER_SITE_OTHER): Payer: Medicare Other

## 2022-10-30 DIAGNOSIS — I5189 Other ill-defined heart diseases: Secondary | ICD-10-CM | POA: Diagnosis not present

## 2022-10-30 DIAGNOSIS — H93A2 Pulsatile tinnitus, left ear: Secondary | ICD-10-CM

## 2022-10-30 DIAGNOSIS — H9312 Tinnitus, left ear: Secondary | ICD-10-CM

## 2022-10-30 LAB — ECHOCARDIOGRAM COMPLETE
AR max vel: 2.68 cm2
AV Peak grad: 10 mmHg
Ao pk vel: 1.58 m/s
Area-P 1/2: 2.48 cm2
Calc EF: 68 %
S' Lateral: 2.3 cm
Single Plane A2C EF: 68.6 %
Single Plane A4C EF: 67 %

## 2022-11-16 ENCOUNTER — Other Ambulatory Visit: Payer: Self-pay | Admitting: Cardiology

## 2022-11-23 DIAGNOSIS — R0982 Postnasal drip: Secondary | ICD-10-CM | POA: Diagnosis not present

## 2022-11-27 ENCOUNTER — Other Ambulatory Visit: Payer: Self-pay | Admitting: Nurse Practitioner

## 2022-11-27 DIAGNOSIS — I6523 Occlusion and stenosis of bilateral carotid arteries: Secondary | ICD-10-CM

## 2022-12-03 ENCOUNTER — Other Ambulatory Visit: Payer: Self-pay | Admitting: Cardiology

## 2022-12-07 DIAGNOSIS — H402211 Chronic angle-closure glaucoma, right eye, mild stage: Secondary | ICD-10-CM | POA: Diagnosis not present

## 2022-12-11 ENCOUNTER — Other Ambulatory Visit: Payer: Self-pay

## 2022-12-11 MED ORDER — LISINOPRIL 40 MG PO TABS: 40.0000 mg | ORAL_TABLET | Freq: Every day | ORAL | 3 refills | Status: DC

## 2022-12-14 DIAGNOSIS — D124 Benign neoplasm of descending colon: Secondary | ICD-10-CM | POA: Diagnosis not present

## 2022-12-14 DIAGNOSIS — E119 Type 2 diabetes mellitus without complications: Secondary | ICD-10-CM | POA: Diagnosis not present

## 2022-12-14 DIAGNOSIS — I10 Essential (primary) hypertension: Secondary | ICD-10-CM | POA: Diagnosis not present

## 2022-12-14 DIAGNOSIS — Z1211 Encounter for screening for malignant neoplasm of colon: Secondary | ICD-10-CM | POA: Diagnosis not present

## 2022-12-14 DIAGNOSIS — D125 Benign neoplasm of sigmoid colon: Secondary | ICD-10-CM | POA: Diagnosis not present

## 2022-12-14 DIAGNOSIS — Z8673 Personal history of transient ischemic attack (TIA), and cerebral infarction without residual deficits: Secondary | ICD-10-CM | POA: Diagnosis not present

## 2022-12-14 DIAGNOSIS — K635 Polyp of colon: Secondary | ICD-10-CM | POA: Diagnosis not present

## 2022-12-14 DIAGNOSIS — I2510 Atherosclerotic heart disease of native coronary artery without angina pectoris: Secondary | ICD-10-CM | POA: Diagnosis not present

## 2022-12-14 DIAGNOSIS — K621 Rectal polyp: Secondary | ICD-10-CM | POA: Diagnosis not present

## 2022-12-14 DIAGNOSIS — D122 Benign neoplasm of ascending colon: Secondary | ICD-10-CM | POA: Diagnosis not present

## 2022-12-14 DIAGNOSIS — D128 Benign neoplasm of rectum: Secondary | ICD-10-CM | POA: Diagnosis not present

## 2022-12-14 DIAGNOSIS — Z79899 Other long term (current) drug therapy: Secondary | ICD-10-CM | POA: Diagnosis not present

## 2022-12-14 DIAGNOSIS — Z86010 Personal history of colonic polyps: Secondary | ICD-10-CM | POA: Diagnosis not present

## 2023-01-02 ENCOUNTER — Encounter: Payer: Self-pay | Admitting: Nurse Practitioner

## 2023-01-02 ENCOUNTER — Ambulatory Visit: Payer: 59 | Attending: Nurse Practitioner | Admitting: Nurse Practitioner

## 2023-01-02 VITALS — BP 106/54 | HR 72 | Ht 70.5 in | Wt 222.0 lb

## 2023-01-02 DIAGNOSIS — E785 Hyperlipidemia, unspecified: Secondary | ICD-10-CM

## 2023-01-02 DIAGNOSIS — H93A2 Pulsatile tinnitus, left ear: Secondary | ICD-10-CM

## 2023-01-02 DIAGNOSIS — I10 Essential (primary) hypertension: Secondary | ICD-10-CM | POA: Diagnosis not present

## 2023-01-02 DIAGNOSIS — I2510 Atherosclerotic heart disease of native coronary artery without angina pectoris: Secondary | ICD-10-CM | POA: Diagnosis not present

## 2023-01-02 DIAGNOSIS — I5189 Other ill-defined heart diseases: Secondary | ICD-10-CM

## 2023-01-02 DIAGNOSIS — I712 Thoracic aortic aneurysm, without rupture, unspecified: Secondary | ICD-10-CM

## 2023-01-02 DIAGNOSIS — I422 Other hypertrophic cardiomyopathy: Secondary | ICD-10-CM

## 2023-01-02 DIAGNOSIS — I5032 Chronic diastolic (congestive) heart failure: Secondary | ICD-10-CM

## 2023-01-02 DIAGNOSIS — I517 Cardiomegaly: Secondary | ICD-10-CM

## 2023-01-02 NOTE — Progress Notes (Unsigned)
Cardiology Office Note:    Date:  01/02/2023  ID:  Matthew Cain, DOB 07-03-1956, MRN [id number]  PCP:  Matthew Cain, [place] Prov[facility] Cardiologist:  Minus Breeding, MD     Referring MD: No ref. provider found   CC: Here for follow-up  History of Present Illness:    Matthew Cain is a 67 y.o. 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  He previously saw Dr. Bronson Cain.  In 2012 he was noted to have mild chest pain with mild CAD as seen on cardiac catheterization.  In 2020 he underwent a nuclear stress test that was negative for ischemia.  Echocardiogram last year revealed normal EF with severe septal hypertrophy, grade 1 DD, was slightly enlarged aortic root.  Cardiac MRI did not reveal septal hypertrophy.   Last seen by Dr. Percival Cain on September 28, 2021.  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  Overall was doing well from a cardiac perspective and denied any new complaints.  Presented to [facility] in November 2023 for outpatient planned cerebral angiogram with endovascular embolization of focal dilation of emissary vein. No complications post surgery and was told to follow up outpatient.   10/17/22 - Seen at the request of his neurologist.  He stated after his surgery, noticed ringing in his ears and was having low diastolic BP readings.  Recent BP at home has been 130 over mid to low 60s.  Did state in the past he was scheduled to do colonoscopy about 6 weeks ago, but needed some blood transfusion and is now taking iron pills twice daily. Echo showed preserved EF, mild LVH,  grade 1 DD. 1-39% bilateral carotid stenosis.   01/02/2023 - Doing well.  Denies any acute cardiac complaints or issues.  Still continues to note ringing in his left ear.  Has been seen at specialist office recently, believes it is coming from inner ear.  He will be seeing a ear nose and throat doctor Monday for evaluation.  Denies any chest pain, shortness of breath, palpitations, syncope, presyncope, dizziness, orthopnea, PND, swelling or significant weight changes, acute bleeding, or claudication.  Past Medical History:  Diagnosis Date   Amputation finger    left ring finger   Anxiety    CAD (coronary artery disease)    LHC 10/12:  mild plaque in the LAD but no obstructive CAD   Chronic diastolic heart failure (HCC)    EF 0000000, grade 1 diastolic dysfunction; no WMAs, echo, 11/2011; s/p pulmo. edema c/b VDRF 11/12; Echo 09/08/11: severe LVH, no SAM, no LVOT gradient, EF 55%, mild BAE, mild reduced RVSF.   CKD (chronic kidney disease)    Depression    Diabetes mellitus    Type II   GERD (gastroesophageal reflux disease)    History of kidney stones    History of right ACA stroke    10/12  --  MRI 09/13/11:  Acute right ACA infarct involving corpus callosum.  MRA 09/13/11: severe intracranial ASD, right ACA occluded, left PCA and right MCA with severe disease.  Dopplers were neg for significant ICA stenosis   HLD (hyperlipidemia)  Hypertension    Renal insufficiency    Spermatocele    Thoracic aortic aneurysm (HCC)    CT in 10/12: 4.4 cm ascending thoracic aortic aneurysm    Past Surgical History:  Procedure Laterality Date    vasectomy     bilateral   CARDIAC CATHETERIZATION  09/09/11   IR ANGIO EXTERNAL CAROTID SEL EXT CAROTID BILAT MOD SED  09/07/2022   IR ANGIO EXTERNAL CAROTID SEL EXT CAROTID UNI R MOD SED  06/03/2021   IR ANGIO INTRA EXTRACRAN SEL COM CAROTID INNOMINATE UNI L MOD SED  06/03/2021   IR ANGIO INTRA EXTRACRAN SEL INTERNAL CAROTID BILAT MOD SED  09/07/2022   IR  ANGIO INTRA EXTRACRAN SEL INTERNAL CAROTID UNI R MOD SED  06/03/2021   IR ANGIO VERTEBRAL SEL VERTEBRAL BILAT MOD SED  06/03/2021   IR ANGIO VERTEBRAL SEL VERTEBRAL UNI R MOD SED  09/08/2022   IR ANGIOGRAM FOLLOW UP STUDY  09/28/2022   IR ANGIOGRAM FOLLOW UP STUDY  09/28/2022   IR ANGIOGRAM FOLLOW UP STUDY  09/28/2022   IR ANGIOGRAM FOLLOW UP STUDY  09/28/2022   IR ANGIOGRAM FOLLOW UP STUDY  09/28/2022   IR ANGIOGRAM FOLLOW UP STUDY  09/28/2022   IR ANGIOGRAM FOLLOW UP STUDY  09/28/2022   IR ANGIOGRAM FOLLOW UP STUDY  09/28/2022   IR ANGIOGRAM FOLLOW UP STUDY  09/28/2022   IR ANGIOGRAM FOLLOW UP STUDY  09/28/2022   IR CT HEAD LTD  09/28/2022   IR TRANSCATH/EMBOLIZ  09/28/2022   IR US GUIDE VASC ACCESS RIGHT  06/03/2021   IR US GUIDE VASC ACCESS RIGHT  09/07/2022   IR US GUIDE VASC ACCESS RIGHT  09/28/2022   IR US GUIDE VASC ACCESS RIGHT  09/28/2022   RADIOLOGY WITH ANESTHESIA N/A 09/28/2022   Procedure: Embolization of emissary vein;  Surgeon: Matthew Earls, MD;  Location: [facility];  Service: Radiology;  Laterality: N/A;   ROTATOR CUFF REPAIR  07/2015   LEFT   SPERMATOCELECTOMY  1990's   RT side   SPERMATOCELECTOMY Left 07/20/2015   Procedure: LEFT HYDROCELECTOMY;  Surgeon: Matthew Seal, MD;  Location: [facility];  Service: Urology;  Laterality: Left;   TRANSURETHRAL INCISION OF PROSTATE N/A 07/20/2015   Procedure: TRANSURETHRAL INCISION OF THE PROSTATE (TUIP);  Surgeon: Matthew Seal, MD;  Location: [facility];  Service: Urology;  Laterality: N/A;    Current Medications: Current Meds  Medication Sig   amLODipine (NORVASC) 10 MG tablet TAKE 1 TABLET BY MOUTH ONCE DAILY IN THE MORNING   aspirin EC 81 MG tablet Take 1 tablet (81 mg total) by mouth daily. Swallow whole.   atorvastatin (LIPITOR) 80 MG tablet Take 80 mg by mouth at bedtime.   BD PEN NEEDLE NANO 2ND GEN 32G X 4 MM MISC USE WITH LANTUS SOLOSTAR TWICE DAILY   BINAXNOW COVID-19 AG HOME TEST KIT Use as Directed on the Package    busPIRone (BUSPAR) 5 MG tablet Take 5 mg by mouth 3 (three) times daily.   Carboxymethylcellul-Glycerin (CLEAR EYES FOR DRY EYES OP) Place 1 drop into both eyes daily as needed (dry eyes).   carvedilol (COREG) 25 MG tablet Take 1 tablet (25 mg total) by mouth 2 (two) times daily. NEED OV.   clopidogrel (PLAVIX) 75 MG tablet Take 1 tablet (75 mg total) by mouth daily.   diclofenac Sodium (VOLTAREN) 1 % GEL Apply 1 Application topically 4 (four) times daily as needed (pain).   diphenhydrAMINE HCl, Sleep, (ZZZQUIL) 50 MG/30ML LIQD Take  50 mg by mouth at bedtime as needed (sleep).   DULoxetine (CYMBALTA) 60 MG capsule Take 60 mg by mouth daily.   empagliflozin (JARDIANCE) 10 MG TABS tablet Take 1 tablet by mouth daily.   famotidine (PEPCID) 40 MG tablet Take 40 mg by mouth at bedtime.   ferrous sulfate 325 (65 FE) MG tablet Take 325 mg by mouth 2 (two) times daily.   Garlic (GARLIQUE PO) Take 1 tablet by mouth at bedtime.   glipiZIDE (GLUCOTROL) 5 MG tablet Take 10 mg by mouth 2 (two) times daily.   isosorbide mononitrate (IMDUR) 30 MG 24 hr tablet Take 1 tablet (30 mg total) by mouth daily.   lisinopril (ZESTRIL) 40 MG tablet Take 1 tablet (40 mg total) by mouth daily.   loratadine (CLARITIN) 10 MG tablet Take 10 mg by mouth daily.   metFORMIN (GLUCOPHAGE) 1000 MG tablet Take 1,000 mg by mouth 2 (two) times daily.   montelukast (SINGULAIR) 10 MG tablet Take 10 mg by mouth at bedtime.   nitroGLYCERIN (NITROSTAT) 0.4 MG SL tablet Place 1 tablet (0.4 mg total) under the tongue every 5 (five) minutes as needed for chest pain.   Omega-3 Fatty Acids (FISH OIL) 1000 MG CAPS Take 1,000 mg by mouth at bedtime.   pantoprazole (PROTONIX) 40 MG tablet Take 40 mg by mouth at bedtime.   spironolactone (ALDACTONE) 25 MG tablet Take 1 tablet by mouth once daily     Allergies:   Gabapentin and Tomato   Social History   Socioeconomic History   Marital status: Divorced    Spouse name: Not on file   Number of  children: Not on file   Years of education: Not on file   Highest education level: Not on file  Occupational History   Not on file  Tobacco Use   Smoking status: Former    Packs/day: 1.00    Years: 30.00    Total pack years: 30.00    Types: Cigarettes    Quit date: 11/13/1994    Years since quitting: 28.1   Smokeless tobacco: Never  Vaping Use   Vaping Use: Never used  Substance and Sexual Activity   Alcohol use: Yes    Comment: Occasional   Drug use: No   Sexual activity: Not Currently  Other Topics Concern   Not on file  Social History Narrative   Lives alone   Right Handed   Drinks 2-3 cups caffeine daily   Social Determinants of Health   Financial Resource Strain: Not on file  Food Insecurity: Not on file  Transportation Needs: Not on file  Physical Activity: Not on file  Stress: Not on file  Social Connections: Not on file     Family History: The patient's family history includes Cerebral aneurysm in his father and another family member; Diabetes in an other family member; Heart attack in his mother; Hypertension in an other family member.  ROS:   Review of Systems  Constitutional: Negative.   HENT:  Positive for tinnitus. Negative for congestion, ear discharge, ear pain, hearing loss, nosebleeds, sinus pain and sore throat.   Eyes: Negative.   Respiratory: Negative.  Negative for stridor.   Cardiovascular: Negative.   Gastrointestinal: Negative.   Genitourinary: Negative.   Musculoskeletal: Negative.   Skin: Negative.   Neurological: Negative.   Endo/Heme/Allergies: Negative.   Psychiatric/Behavioral: Negative.      Please see the history of present illness.    All other systems reviewed and are negative.  EKGs/Labs/Other  Studies Reviewed:    The following studies were reviewed today:   EKG:  EKG is not ordered today.   Cardiac monitor 09/2021: Patch Wear Time: 2 days and 22 hours (2022-11-19T18:11:06-499 to 2022-11-22T16:52:57-0500) Patient  had a min HR of 68 bpm, max HR of 162 bpm, and avg HR of 85 bpm. Predominant underlying rhythm was Sinus Rhythm. First Degree AV Block was present. Intermittent Bundle Branch Block was present. QRS morphology changes were present throughout recording. 4 Supraventricular Tachycardia runs occurred, the run with the fastest interval lasting 10 beats with a max rate of 162 bpm (avg 152 bpm); the run with the fastest interval was also the longest. Isolated SVEs were occasional (4.1%, 14733), SVE Couplets were rare (<1.0%, 107), and SVE Triplets were rare (<1.0%, 37). Isolated VEs were rare (<1.0%), VE Couplets were rare (<1.0%), and no VE Triplets were present.   Normal sinus rhythm Runs of supraventricular tachycardia The longest run was 10 beats. No sustained arrhythmias   Cardiac MRI on August 27, 2020: 1. Normal left ventricular size with severe basal septal hypertrophy (18 mm) and moderate hypertrophy of the remaining myocardium (13 mm) with normal systolic function (LVEF = 64%). There are no regional Savarino motion abnormalities and no late gadolinium enhancement in the left ventricular myocardium. There is no SAM and no LVOT obstruction.   2. Normal right ventricular size, thickness and systolic function (LVEF = 50%). There are no regional Quirino motion abnormalities.   3. Normal left and right atrial size.   4. Mild ascending aortic aneurysm with maximum diameter 42 mm.   5. Mildly dilated pulmonary artery measuring 33 mm.   6. No significant valvular abnormalities.   There is no evidence for a hypertrophic cardiomyopathy but rather a hypertensive heart disease.  Lexiscan on November 27, 2018: No diagnostic ST segment changes to indicate ischemia. Small, mild intensity, partially reversible inferior defect most prominent on rest imaging and therefore suggestive of variable soft tissue attenuation. No definitive ischemic defects. This is a low risk study. Nuclear stress EF: 49%.  Visually, LV contraction appears normal.  2D echocardiogram on November 26, 2018: Study Conclusions   - Left ventricle: The cavity size was normal. Systolic function was    normal. The estimated ejection fraction was in the range of 60%    to 65%. Kappes motion was normal; there were no regional Grosshans    motion abnormalities. Doppler parameters are consistent with    abnormal left ventricular relaxation (grade 1 diastolic    dysfunction). Doppler parameters are consistent with high    ventricular filling pressure. Severe focal basal septal    hypertrophy and otherwise moderate hypertrophy of remaining    myocardium.  - Aortic valve: Trileaflet; mildly thickened leaflets.  - Mitral valve: Moderately calcified annulus.   Recent Labs: 09/28/2022: Hemoglobin 9.1; Platelets 355 10/18/2022: ALT 21; BUN 19; Creatinine, Ser 1.16; Magnesium 2.0; Potassium 4.4; Sodium 138; TSH 1.268  Recent Lipid Panel    Component Value Date/Time   CHOL 110 10/18/2022 0925   CHOL 87 (L) 08/05/2021 1021   TRIG 172 (H) 10/18/2022 0925   HDL 36 (L) 10/18/2022 0925   HDL 32 (L) 08/05/2021 1021   CHOLHDL 3.1 10/18/2022 0925   VLDL 34 10/18/2022 0925   LDLCALC 40 10/18/2022 0925   LDLCALC 22 08/05/2021 1021        Physical Exam:    VS:  BP (!) 106/54   Pulse 72   Ht 5' 10.5" (1.791 m)  Wt 222 lb (100.7 kg)   SpO2 95%   BMI 31.40 kg/m     Wt Readings from Last 3 Encounters:  01/02/23 222 lb (100.7 kg)  10/17/22 223 lb 9.6 oz (101.4 kg)  09/28/22 212 lb (96.2 kg)     GEN: Well nourished, well developed 67 year old male in no acute distress HEENT: Normal NECK: No JVD; No carotid bruits CARDIAC: S1/S2, RRR, no murmurs, rubs, gallops; 2+ peripheral pulses  RESPIRATORY:  Clear and diminished to auscultation without rales, wheezing or rhonchi  MUSCULOSKELETAL:  No edema; amputation to left ring finger, otherwise normal SKIN: Warm and dry NEUROLOGIC:  Alert and oriented x 3 PSYCHIATRIC:  Normal affect    ASSESSMENT:    1. Coronary artery disease involving native heart without angina pectoris, unspecified vessel or lesion type   2. Hyperlipidemia, unspecified hyperlipidemia type   3. Asymmetric septal hypertrophy (HCC)   4. Diastolic dysfunction   5. Chronic diastolic CHF (congestive heart failure) ([place])   6. Essential hypertension   7. Thoracic aortic aneurysm without rupture, unspecified part ([place])   8. Pulsatile tinnitus of left ear     PLAN:    In order of problems listed above:  CAD Lexiscan in 2020 was considered low risk and did not indicate ischemia. Stable with no anginal symptoms. No indication for ischemic evaluation.  Continue aspirin, Lipitor, carvedilol, Plavix, Imdur, lisinopril, omega-3 fatty acid.  Heart healthy diet and regular cardiovascular exercise encouraged.   2. HLD LDL 40/10/2022. Continue current medication regimen. Heart healthy diet and regular cardiovascular exercise encouraged.   3. Hx of asymmetric septal hypertrophy, history of diastolic dysfunction TTE [id number] revealed EF 65 to 70%, no RWMA, mild LVH, grade 1 DD, improved from previous imaging. Previous cardiac MRI revealed severe basal septal hypertrophy and moderate hypertrophy of remaining myocardium with normal systolic function, no RWMA there was no SAM or LVOT obstruction, no significant valvular abnormalities, there was no evidence for hypertrophic cardiomyopathy rather hypertensive heart disease.  Discussed monitoring BP.  Continue current medication regimen. Heart healthy diet and regular cardiovascular exercise encouraged.   4. Chronic diastolic heart failure TTE 10/2022 revealed normal EF, grade 1 DD. Euvolemic and well compensated on exam. Low sodium diet, fluid restriction <2L, and daily weights encouraged. Educated to contact our office for weight gain of 2 lbs overnight or 5 lbs in one week.  Continue current medication regimen. Heart healthy diet and regular cardiovascular exercise  encouraged.   5. Hypertension Blood pressure soft today, asymptomatic. Continue current medication regimen. Discussed to monitor BP at home at least 2 hours after medications and sitting for 5-10 minutes.  Heart healthy diet and regular cardiovascular exercise encouraged.   6. Thoracic aortic aneurysm Cardiac MRI in 2021 revealed mildly dilated ascending aortic aneurysm with maximum diameter of 42 mm, dilated pulmonary artery measuring at 2m.  Echo 10/2022 showed normal size and structure of aortic root. Consider CT scan of chest for further evaluation at next OV. Continue current medication regimen. Care precautions discussed. Heart healthy diet and regular cardiovascular exercise encouraged.   7. Pulsatile tinnitus Continues to note left ear tinnitus.  Etiology uncertain. Continue to f/u with ENT.   8. Disposition: Follow-up with Dr. HPercival Spanishor APP in 6 months or sooner if anything changes.     Medication Adjustments/Labs and Tests Ordered: Current medicines are reviewed at length with the patient today.  Concerns regarding medicines are outlined above.  No orders of the defined types were placed in this encounter.  No orders of the defined types were placed in this encounter.   Patient Instructions  Medication Instructions:  Your physician recommends that you continue on your current medications as directed. Please refer to the Current Medication list given to you today.  Labwork: none  Testing/Procedures: none  Follow-Up: Your physician recommends that you schedule a follow-up appointment in: 6 months with Dr. Percival Cain  Any Other Special Instructions Will Be Listed Below (If Applicable).  If you need a refill on your cardiac medications before your next appointment, please call your pharmacy.   SignedFinis Bud, NP  01/03/2023 12:43 PM    [place]

## 2023-01-02 NOTE — Patient Instructions (Addendum)
Medication Instructions:  Your physician recommends that you continue on your current medications as directed. Please refer to the Current Medication list given to you today.  Labwork: none  Testing/Procedures: none  Follow-Up: Your physician recommends that you schedule a follow-up appointment in: 6 months with Dr. Percival Spanish  Any Other Special Instructions Will Be Listed Below (If Applicable).  If you need a refill on your cardiac medications before your next appointment, please call your pharmacy.

## 2023-01-04 ENCOUNTER — Other Ambulatory Visit: Payer: Self-pay | Admitting: Cardiology

## 2023-01-08 DIAGNOSIS — K219 Gastro-esophageal reflux disease without esophagitis: Secondary | ICD-10-CM | POA: Diagnosis not present

## 2023-01-08 DIAGNOSIS — J309 Allergic rhinitis, unspecified: Secondary | ICD-10-CM | POA: Diagnosis not present

## 2023-01-08 DIAGNOSIS — H9312 Tinnitus, left ear: Secondary | ICD-10-CM | POA: Diagnosis not present

## 2023-01-09 DIAGNOSIS — H9312 Tinnitus, left ear: Secondary | ICD-10-CM | POA: Diagnosis not present

## 2023-01-10 DIAGNOSIS — D126 Benign neoplasm of colon, unspecified: Secondary | ICD-10-CM | POA: Diagnosis not present

## 2023-01-11 DIAGNOSIS — Z794 Long term (current) use of insulin: Secondary | ICD-10-CM | POA: Diagnosis not present

## 2023-01-11 DIAGNOSIS — M461 Sacroiliitis, not elsewhere classified: Secondary | ICD-10-CM | POA: Diagnosis not present

## 2023-01-11 DIAGNOSIS — I10 Essential (primary) hypertension: Secondary | ICD-10-CM | POA: Diagnosis not present

## 2023-01-11 DIAGNOSIS — E1165 Type 2 diabetes mellitus with hyperglycemia: Secondary | ICD-10-CM | POA: Diagnosis not present

## 2023-01-11 DIAGNOSIS — N481 Balanitis: Secondary | ICD-10-CM | POA: Diagnosis not present

## 2023-01-11 DIAGNOSIS — M169 Osteoarthritis of hip, unspecified: Secondary | ICD-10-CM | POA: Diagnosis not present

## 2023-01-11 DIAGNOSIS — R202 Paresthesia of skin: Secondary | ICD-10-CM | POA: Diagnosis not present

## 2023-01-15 ENCOUNTER — Other Ambulatory Visit: Payer: Self-pay | Admitting: Cardiology

## 2023-01-22 DIAGNOSIS — G8929 Other chronic pain: Secondary | ICD-10-CM | POA: Diagnosis not present

## 2023-01-22 DIAGNOSIS — M5137 Other intervertebral disc degeneration, lumbosacral region: Secondary | ICD-10-CM | POA: Diagnosis not present

## 2023-01-22 DIAGNOSIS — M533 Sacrococcygeal disorders, not elsewhere classified: Secondary | ICD-10-CM | POA: Diagnosis not present

## 2023-01-22 DIAGNOSIS — R202 Paresthesia of skin: Secondary | ICD-10-CM | POA: Diagnosis not present

## 2023-01-22 DIAGNOSIS — R200 Anesthesia of skin: Secondary | ICD-10-CM | POA: Diagnosis not present

## 2023-01-22 DIAGNOSIS — M1612 Unilateral primary osteoarthritis, left hip: Secondary | ICD-10-CM | POA: Diagnosis not present

## 2023-01-24 DIAGNOSIS — M1612 Unilateral primary osteoarthritis, left hip: Secondary | ICD-10-CM | POA: Diagnosis not present

## 2023-01-24 DIAGNOSIS — Z79899 Other long term (current) drug therapy: Secondary | ICD-10-CM | POA: Diagnosis not present

## 2023-01-24 DIAGNOSIS — I130 Hypertensive heart and chronic kidney disease with heart failure and stage 1 through stage 4 chronic kidney disease, or unspecified chronic kidney disease: Secondary | ICD-10-CM | POA: Diagnosis not present

## 2023-01-24 DIAGNOSIS — I2510 Atherosclerotic heart disease of native coronary artery without angina pectoris: Secondary | ICD-10-CM | POA: Diagnosis not present

## 2023-01-24 DIAGNOSIS — M533 Sacrococcygeal disorders, not elsewhere classified: Secondary | ICD-10-CM | POA: Diagnosis not present

## 2023-01-24 DIAGNOSIS — Z87891 Personal history of nicotine dependence: Secondary | ICD-10-CM | POA: Diagnosis not present

## 2023-01-24 DIAGNOSIS — Z7902 Long term (current) use of antithrombotics/antiplatelets: Secondary | ICD-10-CM | POA: Diagnosis not present

## 2023-01-24 DIAGNOSIS — M5136 Other intervertebral disc degeneration, lumbar region: Secondary | ICD-10-CM | POA: Diagnosis not present

## 2023-01-24 DIAGNOSIS — N182 Chronic kidney disease, stage 2 (mild): Secondary | ICD-10-CM | POA: Diagnosis not present

## 2023-01-24 DIAGNOSIS — Z7982 Long term (current) use of aspirin: Secondary | ICD-10-CM | POA: Diagnosis not present

## 2023-01-24 DIAGNOSIS — G8929 Other chronic pain: Secondary | ICD-10-CM | POA: Diagnosis not present

## 2023-01-24 DIAGNOSIS — E1122 Type 2 diabetes mellitus with diabetic chronic kidney disease: Secondary | ICD-10-CM | POA: Diagnosis not present

## 2023-01-24 DIAGNOSIS — I5032 Chronic diastolic (congestive) heart failure: Secondary | ICD-10-CM | POA: Diagnosis not present

## 2023-01-24 DIAGNOSIS — M5137 Other intervertebral disc degeneration, lumbosacral region: Secondary | ICD-10-CM | POA: Diagnosis not present

## 2023-01-24 DIAGNOSIS — Z794 Long term (current) use of insulin: Secondary | ICD-10-CM | POA: Diagnosis not present

## 2023-01-26 DIAGNOSIS — G8929 Other chronic pain: Secondary | ICD-10-CM | POA: Diagnosis not present

## 2023-01-26 DIAGNOSIS — M533 Sacrococcygeal disorders, not elsewhere classified: Secondary | ICD-10-CM | POA: Diagnosis not present

## 2023-01-26 DIAGNOSIS — M461 Sacroiliitis, not elsewhere classified: Secondary | ICD-10-CM | POA: Diagnosis not present

## 2023-02-02 DIAGNOSIS — H905 Unspecified sensorineural hearing loss: Secondary | ICD-10-CM | POA: Diagnosis not present

## 2023-02-09 ENCOUNTER — Other Ambulatory Visit: Payer: Self-pay | Admitting: Cardiology

## 2023-02-26 DIAGNOSIS — M47816 Spondylosis without myelopathy or radiculopathy, lumbar region: Secondary | ICD-10-CM | POA: Diagnosis not present

## 2023-02-26 DIAGNOSIS — M533 Sacrococcygeal disorders, not elsewhere classified: Secondary | ICD-10-CM | POA: Diagnosis not present

## 2023-02-26 DIAGNOSIS — G8929 Other chronic pain: Secondary | ICD-10-CM | POA: Diagnosis not present

## 2023-03-02 DIAGNOSIS — E611 Iron deficiency: Secondary | ICD-10-CM | POA: Diagnosis not present

## 2023-03-02 DIAGNOSIS — E119 Type 2 diabetes mellitus without complications: Secondary | ICD-10-CM | POA: Diagnosis not present

## 2023-03-09 DIAGNOSIS — Z7902 Long term (current) use of antithrombotics/antiplatelets: Secondary | ICD-10-CM | POA: Diagnosis not present

## 2023-03-09 DIAGNOSIS — Z79899 Other long term (current) drug therapy: Secondary | ICD-10-CM | POA: Diagnosis not present

## 2023-03-09 DIAGNOSIS — R531 Weakness: Secondary | ICD-10-CM | POA: Diagnosis not present

## 2023-03-09 DIAGNOSIS — E139 Other specified diabetes mellitus without complications: Secondary | ICD-10-CM | POA: Diagnosis not present

## 2023-03-09 DIAGNOSIS — Z7984 Long term (current) use of oral hypoglycemic drugs: Secondary | ICD-10-CM | POA: Diagnosis not present

## 2023-03-09 DIAGNOSIS — D508 Other iron deficiency anemias: Secondary | ICD-10-CM | POA: Diagnosis not present

## 2023-03-09 DIAGNOSIS — Z7722 Contact with and (suspected) exposure to environmental tobacco smoke (acute) (chronic): Secondary | ICD-10-CM | POA: Diagnosis not present

## 2023-03-09 DIAGNOSIS — K219 Gastro-esophageal reflux disease without esophagitis: Secondary | ICD-10-CM | POA: Diagnosis not present

## 2023-03-09 DIAGNOSIS — I130 Hypertensive heart and chronic kidney disease with heart failure and stage 1 through stage 4 chronic kidney disease, or unspecified chronic kidney disease: Secondary | ICD-10-CM | POA: Diagnosis not present

## 2023-03-09 DIAGNOSIS — D649 Anemia, unspecified: Secondary | ICD-10-CM | POA: Diagnosis not present

## 2023-03-09 DIAGNOSIS — Z794 Long term (current) use of insulin: Secondary | ICD-10-CM | POA: Diagnosis not present

## 2023-03-09 DIAGNOSIS — E1159 Type 2 diabetes mellitus with other circulatory complications: Secondary | ICD-10-CM | POA: Diagnosis not present

## 2023-03-09 DIAGNOSIS — I252 Old myocardial infarction: Secondary | ICD-10-CM | POA: Diagnosis not present

## 2023-03-09 DIAGNOSIS — I2510 Atherosclerotic heart disease of native coronary artery without angina pectoris: Secondary | ICD-10-CM | POA: Diagnosis not present

## 2023-03-09 DIAGNOSIS — E1122 Type 2 diabetes mellitus with diabetic chronic kidney disease: Secondary | ICD-10-CM | POA: Diagnosis not present

## 2023-03-09 DIAGNOSIS — Z7982 Long term (current) use of aspirin: Secondary | ICD-10-CM | POA: Diagnosis not present

## 2023-03-09 DIAGNOSIS — I5032 Chronic diastolic (congestive) heart failure: Secondary | ICD-10-CM | POA: Diagnosis not present

## 2023-03-09 DIAGNOSIS — Z91018 Allergy to other foods: Secondary | ICD-10-CM | POA: Diagnosis not present

## 2023-03-09 DIAGNOSIS — N182 Chronic kidney disease, stage 2 (mild): Secondary | ICD-10-CM | POA: Diagnosis not present

## 2023-03-09 DIAGNOSIS — Z87891 Personal history of nicotine dependence: Secondary | ICD-10-CM | POA: Diagnosis not present

## 2023-03-09 DIAGNOSIS — Z888 Allergy status to other drugs, medicaments and biological substances status: Secondary | ICD-10-CM | POA: Diagnosis not present

## 2023-03-09 DIAGNOSIS — M47816 Spondylosis without myelopathy or radiculopathy, lumbar region: Secondary | ICD-10-CM | POA: Diagnosis not present

## 2023-03-09 DIAGNOSIS — Z86010 Personal history of colonic polyps: Secondary | ICD-10-CM | POA: Diagnosis not present

## 2023-03-14 DIAGNOSIS — D508 Other iron deficiency anemias: Secondary | ICD-10-CM | POA: Diagnosis not present

## 2023-03-14 DIAGNOSIS — Z0189 Encounter for other specified special examinations: Secondary | ICD-10-CM | POA: Diagnosis not present

## 2023-03-18 NOTE — Progress Notes (Unsigned)
  Cardiology Office Note:   Date:  03/21/2023  ID:  Matthew Cain, DOB 1956-07-10, MRN [id number]  History of Present Illness:   Matthew Cain is a 67 y.o. male for evaluation of CAD.   In 2012 he was noted to have mild chest pain with mild CAD as seen on cardiac catheterization.  In 2020 he underwent a nuclear stress test that was negative for ischemia.  Echocardiogram last year revealed normal EF with severe septal hypertrophy, grade 1 DD, was slightly enlarged aortic root.  Cardiac MRI did not reveal septal hypertrophy.   Instead he was thought to have some diffuse hypertrophy probably related to hypertension.  There was no LV outflow structure and or late gadolinium enhancement.   Echo demonstrated a normal EF in December.  Since I last saw him he has been managed for iron deficiency anemia.  There is been no clear etiology and he has had blood transfusions.  He is also had coiling of the cerebral aneurysm.  He feels much better.  He is not having dizziness or tinnitus that he was having.  He is breathing better.  He is not having any new shortness of breath, PND or orthopnea.  He denies any chest pressure, neck or arm discomfort.   ROS: Positive for back pain and numbness and joint pain.  Otherwise as stated in the HPI and negative for other systems.  Studies Reviewed:    EKG:  NA    Risk Assessment/Calculations:             Physical Exam:   VS:  BP 118/70   Pulse 76   Ht 5' 10.5" (1.791 m)   Wt 223 lb (101.2 kg)   BMI 31.54 kg/m    Wt Readings from Last 3 Encounters:  03/21/23 223 lb (101.2 kg)  01/02/23 222 lb (100.7 kg)  10/17/22 223 lb 9.6 oz (101.4 kg)     GEN: Well nourished, well developed in no acute distress NECK: No JVD; No carotid bruits CARDIAC: RRR, no murmurs, rubs, gallops RESPIRATORY:  Clear to auscultation without rales, wheezing or rhonchi  ABDOMEN: Soft, non-tender, non-distended EXTREMITIES:  No edema; No deformity   ASSESSMENT AND PLAN:   CAD: He had a low  risk Lexiscan in 2022.   He had previous mild plaque.  No change in therapy.   HLD: LDL was 40 with an HDL of 36.  Continue meds as listed.   Hx of asymmetric septal hypertrophy, history of diastolic dysfunction:   He actually seems to have global LVH probably related to hypertension.  No further restratification is necessary and he is not having any symptoms.  We will continue with medical management of his blood pressure.  Chronic diastolic heart failure: He seems to be euvolemic.  Continue the meds as listed.   Hypertension: The blood pressure is well-controlled.  Continue the meds as listed.  Thoracic aortic aneurysm: He has a stable aneurysm of about 42 mm.  This has been stable over the years and so I probably will skip imaging this next year.  Pulsatile tinnitus: This is now improved.  No change in therapy.        Signed, Rollene Rotunda, MD

## 2023-03-21 ENCOUNTER — Ambulatory Visit (INDEPENDENT_AMBULATORY_CARE_PROVIDER_SITE_OTHER): Payer: 59 | Admitting: Cardiology

## 2023-03-21 ENCOUNTER — Encounter: Payer: Self-pay | Admitting: Cardiology

## 2023-03-21 VITALS — BP 118/70 | HR 76 | Ht 70.5 in | Wt 223.0 lb

## 2023-03-21 DIAGNOSIS — I422 Other hypertrophic cardiomyopathy: Secondary | ICD-10-CM | POA: Diagnosis not present

## 2023-03-21 DIAGNOSIS — I10 Essential (primary) hypertension: Secondary | ICD-10-CM | POA: Diagnosis not present

## 2023-03-21 DIAGNOSIS — I25118 Atherosclerotic heart disease of native coronary artery with other forms of angina pectoris: Secondary | ICD-10-CM | POA: Diagnosis not present

## 2023-03-21 DIAGNOSIS — H93A9 Pulsatile tinnitus, unspecified ear: Secondary | ICD-10-CM | POA: Diagnosis not present

## 2023-03-21 DIAGNOSIS — E785 Hyperlipidemia, unspecified: Secondary | ICD-10-CM

## 2023-03-21 DIAGNOSIS — I5032 Chronic diastolic (congestive) heart failure: Secondary | ICD-10-CM

## 2023-03-21 DIAGNOSIS — I712 Thoracic aortic aneurysm, without rupture, unspecified: Secondary | ICD-10-CM | POA: Diagnosis not present

## 2023-03-21 NOTE — Patient Instructions (Signed)
Medication Instructions:  The current medical regimen is effective;  continue present plan and medications.  *If you need a refill on your cardiac medications before your next appointment, please call your pharmacy*  Follow-Up: At [facility], you and your health needs are our priority.  As part of our continuing mission to provide you with exceptional heart care, we have created designated Provider Care Teams.  These Care Teams include your primary Cardiologist (physician) and Advanced Practice Providers (APPs -  Physician Assistants and Nurse Practitioners) who all work together to provide you with the care you need, when you need it.  We recommend signing up for the patient portal called "MyChart".  Sign up information is provided on this After Visit Summary.  MyChart is used to connect with patients for Virtual Visits (Telemedicine).  Patients are able to view lab/test results, encounter notes, upcoming appointments, etc.  Non-urgent messages can be sent to your provider as well.   To learn more about what you can do with MyChart, go to [email or web address].    Your next appointment:   1 year(s)  Provider:   James Hochrein, MD     

## 2023-03-23 DIAGNOSIS — M47816 Spondylosis without myelopathy or radiculopathy, lumbar region: Secondary | ICD-10-CM | POA: Diagnosis not present

## 2023-03-31 ENCOUNTER — Other Ambulatory Visit: Payer: Self-pay | Admitting: Cardiology

## 2023-04-12 ENCOUNTER — Encounter: Payer: Self-pay | Admitting: Nutrition

## 2023-04-12 ENCOUNTER — Encounter: Payer: 59 | Attending: Family Medicine | Admitting: Nutrition

## 2023-04-12 DIAGNOSIS — E669 Obesity, unspecified: Secondary | ICD-10-CM | POA: Diagnosis not present

## 2023-04-12 DIAGNOSIS — E119 Type 2 diabetes mellitus without complications: Secondary | ICD-10-CM | POA: Insufficient documentation

## 2023-04-12 DIAGNOSIS — I219 Acute myocardial infarction, unspecified: Secondary | ICD-10-CM | POA: Diagnosis not present

## 2023-04-12 DIAGNOSIS — E1122 Type 2 diabetes mellitus with diabetic chronic kidney disease: Secondary | ICD-10-CM | POA: Diagnosis not present

## 2023-04-12 DIAGNOSIS — N189 Chronic kidney disease, unspecified: Secondary | ICD-10-CM | POA: Insufficient documentation

## 2023-04-12 DIAGNOSIS — I129 Hypertensive chronic kidney disease with stage 1 through stage 4 chronic kidney disease, or unspecified chronic kidney disease: Secondary | ICD-10-CM | POA: Diagnosis not present

## 2023-04-12 DIAGNOSIS — Z713 Dietary counseling and surveillance: Secondary | ICD-10-CM | POA: Insufficient documentation

## 2023-04-12 DIAGNOSIS — I639 Cerebral infarction, unspecified: Secondary | ICD-10-CM | POA: Diagnosis not present

## 2023-04-12 NOTE — Patient Instructions (Signed)
GOals  CHange race foods to vegetables, fruits and whole grains with fish or chicken. Try to get plant butter Eat whole plants based foods Cut out processed foods Let  your FOODS be your MEDICINE. Walk 30 minutes a day Get A1C to 6.5% Choose high fiber foods

## 2023-04-12 NOTE — Progress Notes (Signed)
Medical Nutrition Therapy  Appointment Start time:  1000  Appointment End time:  1130  Primary concerns today: DM Type 2, MI, CVA, Obesity  Referral diagnosis: E11.8, I21.9, I63.5, E66.9 Preferred learning style: NO preference  Learning readiness: Changes in process   NUTRITION ASSESSMENT  67 yr old bmale referred by Dr. Allena Katz for diabetes education. PMH: MI, CVA, HTN, OBesity, Type 2 Dm, CKD PCP Dr. Catalina Lunger.  A1C use to be 13% and he has it down to 8.4% with changes in his diet he has made. Currently taking 65 units of Lantus BID, Metformin1000 mg BID and Glipizide 10 mg BID. He was on Jardiance but hasn't taken it because he doesn't think he needs it.  He is willing to work towards eating a whole plant based lifestyle and focus on exercise and weight loss to improve his DM, Obesity, CVD and other health issues. He has cut out sodas, juice, a lot of junk food and sweets. He has been switching over slowly to more foods from a garden and cutting out fried foods.  He would like to be referred to Dr. Fransico Him, Endocrinologist to see if he can't reduce his insulin and improve his DM Mgt.  He would benefit from a CGM to better help him manage his DM.  Anthropometrics  Wt Readings from Last 3 Encounters:  03/21/23 223 lb (101.2 kg)  01/02/23 222 lb (100.7 kg)  10/17/22 223 lb 9.6 oz (101.4 kg)   Ht Readings from Last 3 Encounters:  03/21/23 5' 10.5" (1.791 m)  01/02/23 5' 10.5" (1.791 m)  10/17/22 5' 10.5" (1.791 m)   There is no height or weight on file to calculate BMI. @BMIFA @ Facility age limit for growth %iles is 20 years. Facility age limit for growth %iles is 20 years.    Clinical Medical Hx: See chart Medications: See chart Labs: A1C 8.4% Notable Signs/Symptoms: None  Lifestyle & Dietary Hx Lives by himself.  Estimated daily fluid intake: 100 oz Supplements: Garlic, Vit C Sleep: varies Stress / self-care: no problems Current average weekly physical activity:  walks  24-Hr Dietary Recall Eating 2-3 meals per day. Trying to work on eating healthier foods.  Estimated Energy Needs Calories: 1800 Carbohydrate: 200g Protein: 135g Fat: 50g   NUTRITION DIAGNOSIS  NB-1.1 Food and nutrition-related knowledge deficit As related to Dm Type 2.  As evidenced by A1c 8.4%.[name]   NUTRITION INTERVENTION  Nutrition education (E-1) on the following topics:  Nutrition and Diabetes education provided on My Plate, CHO counting, meal planning, portion sizes, timing of meals, avoiding snacks between meals unless having a low blood sugar, target ranges for A1C and blood sugars, signs/symptoms and treatment of hyper/hypoglycemia, monitoring blood sugars, taking medications as prescribed, benefits of exercising 30 minutes per day and prevention of complications of DM. Lifestyle Medicine  - Whole Food, Plant Predominant Nutrition is highly recommended: Eat Plenty of vegetables, Mushrooms, fruits, Legumes, Whole Grains, Nuts, seeds in lieu of processed meats, processed snacks/pastries red meat, poultry, eggs.    -It is better to avoid simple carbohydrates including: Cakes, Sweet Desserts, Ice Cream, Soda (diet and regular), Sweet Tea, Candies, Chips, Cookies, Store Bought Juices, Alcohol in Excess of  1-2 drinks a day, Lemonade,  Artificial Sweeteners, Doughnuts, Coffee Creamers, "Sugar-free" Products, etc, etc.  This is not a complete list.....  Exercise: If you are able: 30 -60 minutes a day ,4 days a week, or 150 minutes a week.  The longer the better.  Combine stretch, strength, and  aerobic activities.  If you were told in the past that you have high risk for cardiovascular diseases, you may seek evaluation by your heart doctor prior to initiating moderate to intense exercise programs.   Handouts Provided Include  LIfestyle Medicine handouts  Learning Style & Readiness for Change Teaching method utilized: Visual & Auditory  Demonstrated degree of understanding via:  Teach Back  Barriers to learning/adherence to lifestyle change: none  Goals Established by Pt GOals  CHange race foods to vegetables, fruits and whole grains with fish or chicken. Try to get plant butter Eat whole plants based foods Cut out processed foods Let  your FOODS be your MEDICINE. Walk 30 minutes a day Get A1C to 6.5% Choose high fiber foods   MONITORING & EVALUATION Dietary intake, weekly physical activity, and weight in 1 month.  Next Steps  Patient is to work on eating more whole plant based foods. Talk to PCP about request for referral to see Dr. Fransico Him, Endocrinologist..

## 2023-05-05 ENCOUNTER — Other Ambulatory Visit: Payer: Self-pay | Admitting: Cardiology

## 2023-05-14 DIAGNOSIS — H40013 Open angle with borderline findings, low risk, bilateral: Secondary | ICD-10-CM | POA: Diagnosis not present

## 2023-06-12 ENCOUNTER — Encounter: Payer: 59 | Attending: Family Medicine | Admitting: Nutrition

## 2023-06-12 DIAGNOSIS — E669 Obesity, unspecified: Secondary | ICD-10-CM | POA: Insufficient documentation

## 2023-06-12 DIAGNOSIS — E1122 Type 2 diabetes mellitus with diabetic chronic kidney disease: Secondary | ICD-10-CM | POA: Insufficient documentation

## 2023-06-12 DIAGNOSIS — I639 Cerebral infarction, unspecified: Secondary | ICD-10-CM | POA: Insufficient documentation

## 2023-06-12 DIAGNOSIS — I129 Hypertensive chronic kidney disease with stage 1 through stage 4 chronic kidney disease, or unspecified chronic kidney disease: Secondary | ICD-10-CM | POA: Insufficient documentation

## 2023-06-12 DIAGNOSIS — N189 Chronic kidney disease, unspecified: Secondary | ICD-10-CM | POA: Insufficient documentation

## 2023-06-12 DIAGNOSIS — E119 Type 2 diabetes mellitus without complications: Secondary | ICD-10-CM | POA: Insufficient documentation

## 2023-06-12 DIAGNOSIS — I219 Acute myocardial infarction, unspecified: Secondary | ICD-10-CM | POA: Insufficient documentation

## 2023-06-12 DIAGNOSIS — Z713 Dietary counseling and surveillance: Secondary | ICD-10-CM | POA: Insufficient documentation

## 2023-06-12 NOTE — Progress Notes (Signed)
Medical Nutrition Therapy  Appointment Start time:  1500   Appointment End time:  1530  Primary concerns today: DM Type 2, MI, CVA, Obesity  Referral diagnosis: E11.8, I21.9, I63.5, E66.9 Preferred learning style: NO preference  Learning readiness: Changes in process   NUTRITION ASSESSMENT  Dm Follow up Changes made:  65 units of Lantus BID, Metformin1000 mg BID and Glipizide 10 mg BID.      67 yr old bmale referred by Dr. Allena Katz for diabetes education. PMH: MI, CVA, HTN, OBesity, Type 2 Dm, CKD PCP Dr. Catalina Lunger.  A1C use to be 13% and he has it down to 8.4% with changes in his diet he has made. Currently taking 65 units of Lantus BID, Metformin1000 mg BID and Glipizide 10 mg BID. He was on Jardiance but hasn't taken it because he doesn't think he needs it.  He is willing to work towards eating a whole plant based lifestyle and focus on exercise and weight loss to improve his DM, Obesity, CVD and other health issues. He has cut out sodas, juice, a lot of junk food and sweets. He has been switching over slowly to more foods from a garden and cutting out fried foods.  He would like to be referred to Dr. Fransico Him, Endocrinologist to see if he can't reduce his insulin and improve his DM Mgt.  He would benefit from a CGM to better help him manage his DM.  Anthropometrics  Wt Readings from Last 3 Encounters:  03/21/23 223 lb (101.2 kg)  01/02/23 222 lb (100.7 kg)  10/17/22 223 lb 9.6 oz (101.4 kg)   Ht Readings from Last 3 Encounters:  03/21/23 5' 10.5" (1.791 m)  01/02/23 5' 10.5" (1.791 m)  10/17/22 5' 10.5" (1.791 m)   There is no height or weight on file to calculate BMI. @BMIFA @ Facility age limit for growth %iles is 20 years. Facility age limit for growth %iles is 20 years.    Clinical Medical Hx: See chart Medications: See chart Labs: A1C 8.4% Notable Signs/Symptoms: None  Lifestyle & Dietary Hx Lives by himself.  Estimated daily fluid intake: 100  oz Supplements: Garlic, Vit C Sleep: varies Stress / self-care: no problems Current average weekly physical activity: walks  24-Hr Dietary Recall Eating 2-3 meals per day. Trying to work on eating healthier foods.  Estimated Energy Needs Calories: 1800 Carbohydrate: 200g Protein: 135g Fat: 50g   NUTRITION DIAGNOSIS  NB-1.1 Food and nutrition-related knowledge deficit As related to Dm Type 2.  As evidenced by A1c 8.4%.[name]   NUTRITION INTERVENTION  Nutrition education (E-1) on the following topics:  Nutrition and Diabetes education provided on My Plate, CHO counting, meal planning, portion sizes, timing of meals, avoiding snacks between meals unless having a low blood sugar, target ranges for A1C and blood sugars, signs/symptoms and treatment of hyper/hypoglycemia, monitoring blood sugars, taking medications as prescribed, benefits of exercising 30 minutes per day and prevention of complications of DM. Lifestyle Medicine  - Whole Food, Plant Predominant Nutrition is highly recommended: Eat Plenty of vegetables, Mushrooms, fruits, Legumes, Whole Grains, Nuts, seeds in lieu of processed meats, processed snacks/pastries red meat, poultry, eggs.    -It is better to avoid simple carbohydrates including: Cakes, Sweet Desserts, Ice Cream, Soda (diet and regular), Sweet Tea, Candies, Chips, Cookies, Store Bought Juices, Alcohol in Excess of  1-2 drinks a day, Lemonade,  Artificial Sweeteners, Doughnuts, Coffee Creamers, "Sugar-free" Products, etc, etc.  This is not a complete list.....  Exercise: If you are  able: 30 -60 minutes a day ,4 days a week, or 150 minutes a week.  The longer the better.  Combine stretch, strength, and aerobic activities.  If you were told in the past that you have high risk for cardiovascular diseases, you may seek evaluation by your heart doctor prior to initiating moderate to intense exercise programs.   Handouts Provided Include  LIfestyle Medicine  handouts  Learning Style & Readiness for Change Teaching method utilized: Visual & Auditory  Demonstrated degree of understanding via: Teach Back  Barriers to learning/adherence to lifestyle change: none  Goals Established by Pt GOals  CHange race foods to vegetables, fruits and whole grains with fish or chicken. Try to get plant butter Eat whole plants based foods Cut out processed foods Let  your FOODS be your MEDICINE. Walk 30 minutes a day Get A1C to 6.5% Choose high fiber foods   MONITORING & EVALUATION Dietary intake, weekly physical activity, and weight in 1 month.  Next Steps  Patient is to work on eating more whole plant based foods. Talk to PCP about request for referral to see Dr. Fransico Him, Endocrinologist..

## 2023-06-12 NOTE — Patient Instructions (Signed)
Talk to MD about CGM Ask for referral to Dr. Fransico Him, Endocrinologist Keep up the great job.

## 2023-06-26 DIAGNOSIS — Z794 Long term (current) use of insulin: Secondary | ICD-10-CM | POA: Diagnosis not present

## 2023-06-26 DIAGNOSIS — E1165 Type 2 diabetes mellitus with hyperglycemia: Secondary | ICD-10-CM | POA: Diagnosis not present

## 2023-06-26 DIAGNOSIS — I2510 Atherosclerotic heart disease of native coronary artery without angina pectoris: Secondary | ICD-10-CM | POA: Diagnosis not present

## 2023-08-01 ENCOUNTER — Other Ambulatory Visit: Payer: Self-pay | Admitting: Cardiology

## 2023-09-12 ENCOUNTER — Ambulatory Visit: Payer: 59 | Admitting: Nutrition

## 2023-09-21 ENCOUNTER — Other Ambulatory Visit: Payer: Self-pay | Admitting: Cardiology

## 2023-09-27 DIAGNOSIS — Z794 Long term (current) use of insulin: Secondary | ICD-10-CM | POA: Diagnosis not present

## 2023-09-27 DIAGNOSIS — E1165 Type 2 diabetes mellitus with hyperglycemia: Secondary | ICD-10-CM | POA: Diagnosis not present

## 2023-09-27 DIAGNOSIS — R809 Proteinuria, unspecified: Secondary | ICD-10-CM | POA: Diagnosis not present

## 2023-09-27 DIAGNOSIS — D508 Other iron deficiency anemias: Secondary | ICD-10-CM | POA: Diagnosis not present

## 2023-09-27 DIAGNOSIS — I10 Essential (primary) hypertension: Secondary | ICD-10-CM | POA: Diagnosis not present

## 2023-09-27 DIAGNOSIS — E1142 Type 2 diabetes mellitus with diabetic polyneuropathy: Secondary | ICD-10-CM | POA: Diagnosis not present

## 2023-09-27 DIAGNOSIS — J3089 Other allergic rhinitis: Secondary | ICD-10-CM | POA: Diagnosis not present

## 2023-10-01 ENCOUNTER — Telehealth: Payer: Self-pay

## 2023-10-01 NOTE — Telephone Encounter (Signed)
Patient sent MyChart message in regards to rescheduling appointmtents

## 2023-10-02 ENCOUNTER — Encounter: Payer: Self-pay | Admitting: Endocrinology

## 2023-10-02 ENCOUNTER — Ambulatory Visit (INDEPENDENT_AMBULATORY_CARE_PROVIDER_SITE_OTHER): Payer: 59 | Admitting: Endocrinology

## 2023-10-02 VITALS — BP 140/90 | HR 78 | Resp 20 | Ht 70.5 in | Wt 228.0 lb

## 2023-10-02 DIAGNOSIS — Z794 Long term (current) use of insulin: Secondary | ICD-10-CM

## 2023-10-02 DIAGNOSIS — E1169 Type 2 diabetes mellitus with other specified complication: Secondary | ICD-10-CM

## 2023-10-02 MED ORDER — DEXCOM G7 SENSOR MISC: 1.0000 | 3 refills | Status: DC

## 2023-10-02 MED ORDER — TIRZEPATIDE 2.5 MG/0.5ML ~~LOC~~ SOAJ: SUBCUTANEOUS | 4 refills | Status: DC

## 2023-10-02 NOTE — Patient Instructions (Signed)
Lantus 60 units two times a day.  Jardiance 25mg  daily. Glipizide 10 mg two times a day. Metformin 1000 mg two times a day.   Start Moujaro 2.5 mg weekly for 4 weeks and increase to 5 mg weekly.   Bring glucometer in follow up visit.  DEXCOM G7 monitor sent to pharmacy.

## 2023-10-02 NOTE — Progress Notes (Addendum)
Outpatient Endocrinology Note Matthew Anaiya Wisinski, MD  10/02/23  Patient's Name: Matthew Cain    DOB: 1955/12/29    MRN: [id number]                                                    REASON OF VISIT: New consult of type 2 diabetes mellitus  REFERRING PROVIDER: Catalina Lunger, DO  PCP: Matthew Lunger, DO  HISTORY OF PRESENT ILLNESS:   Matthew Cain is a 67 y.o. old male with past medical history listed below, is here for new consult for type 2 diabetes mellitus.   Pertinent Diabetes History: Patient was diagnosed with type 2 diabetes mellitus in his age of 22s.  Patient has uncontrolled type 2 diabetes mellitus and report endocrinology for the evaluation and management.  Patient reports he has been on insulin therapy for several years.  He has hemoglobin A1c mostly in the range of 8%.  Used to be as high as 10 to 12% in the past.  He had seen diabetic educator in the July.  No history of diabetes ketoacidosis and hospitalization due to diabetes related condition.  Some of the medical records reviewed in Care Everywhere.  No history of pancreatitis.  No family history of medullary thyroid cancer, MEN 2 syndrome.  Chronic Diabetes Complications : Retinopathy: ? no. Last ophthalmology exam was done on annually, following with ophthalmology regularly.  Nephropathy: Yes, microalbuminuria, on ACE/ARB / lisinopril Peripheral neuropathy: yes, tingling of toes.  Coronary artery disease: CAD Stroke: no  Relevant comorbidities and cardiovascular risk factors: Obesity: yes Body mass index is 32.25 kg/m.  Hypertension: Yes  Hyperlipidemia : Yes, on statin   Current / Home Diabetic regimen includes:  Lantus 60 units two times a day.  Jardiance 25mg  daily. Glipizide 10 mg two times a day. Metformin 1000 mg two times a day.    Prior diabetic medications:  Glycemic data:   He forgot to bring glucometer in the clinic today.  He reports blood sugar mostly in the range of 90-1 35 and he has been  checking once a day in the morning fasting only.  Hypoglycemia: Patient has denies hypoglycemic episodes. Patient has hypoglycemia awareness.  Factors modifying glucose control: 1.  Diabetic diet assessment: 2-3 meals a day. Occasional candy and soda.  2.  Staying active or exercising: no formal exercise.   3.  Medication compliance: compliant most of the time.  Interval history  Patient presented to establish diabetes care  Lab results from Care Everywhere reviewed.  He has elevated microalbumin creatinine ratio of 311.  Hemoglobin A1c was 8.7% on September 24, 2023.  Cholesterol level in March 2023 LDL 28, HDL 32, triglyceride 284, total cholesterol 100.  He has complaints of numbness and tingling of the feet.  REVIEW OF SYSTEMS As per history of present illness.   PAST MEDICAL HISTORY: Past Medical History:  Diagnosis Date   Amputation finger    left ring finger   Anxiety    CAD (coronary artery disease)    LHC 10/12:  mild plaque in the LAD but no obstructive CAD   Chronic diastolic heart failure (HCC)    EF 55-60%, grade 1 diastolic dysfunction; no WMAs, echo, 11/2011; s/p pulmo. edema c/b VDRF 11/12; Echo 09/08/11: severe LVH, no SAM, no LVOT gradient, EF 55%, mild BAE, mild reduced RVSF.  CKD (chronic kidney disease)    Depression    Diabetes mellitus    Type II   GERD (gastroesophageal reflux disease)    History of kidney stones    History of right ACA stroke    10/12  --  MRI 09/13/11:  Acute right ACA infarct involving corpus callosum.  MRA 09/13/11: severe intracranial ASD, right ACA occluded, left PCA and right MCA with severe disease.  Dopplers were neg for significant ICA stenosis   HLD (hyperlipidemia)    Hypertension    Renal insufficiency    Spermatocele    Thoracic aortic aneurysm (HCC)    CT in 10/12: 4.4 cm ascending thoracic aortic aneurysm    PAST SURGICAL HISTORY: Past Surgical History:  Procedure Laterality Date    vasectomy     bilateral    CARDIAC CATHETERIZATION  09/09/11   IR ANGIO EXTERNAL CAROTID SEL EXT CAROTID BILAT MOD SED  09/07/2022   IR ANGIO EXTERNAL CAROTID SEL EXT CAROTID UNI R MOD SED  06/03/2021   IR ANGIO INTRA EXTRACRAN SEL COM CAROTID INNOMINATE UNI L MOD SED  06/03/2021   IR ANGIO INTRA EXTRACRAN SEL INTERNAL CAROTID BILAT MOD SED  09/07/2022   IR ANGIO INTRA EXTRACRAN SEL INTERNAL CAROTID UNI R MOD SED  06/03/2021   IR ANGIO VERTEBRAL SEL VERTEBRAL BILAT MOD SED  06/03/2021   IR ANGIO VERTEBRAL SEL VERTEBRAL UNI R MOD SED  09/08/2022   IR ANGIOGRAM FOLLOW UP STUDY  09/28/2022   IR ANGIOGRAM FOLLOW UP STUDY  09/28/2022   IR ANGIOGRAM FOLLOW UP STUDY  09/28/2022   IR ANGIOGRAM FOLLOW UP STUDY  09/28/2022   IR ANGIOGRAM FOLLOW UP STUDY  09/28/2022   IR ANGIOGRAM FOLLOW UP STUDY  09/28/2022   IR ANGIOGRAM FOLLOW UP STUDY  09/28/2022   IR ANGIOGRAM FOLLOW UP STUDY  09/28/2022   IR ANGIOGRAM FOLLOW UP STUDY  09/28/2022   IR ANGIOGRAM FOLLOW UP STUDY  09/28/2022   IR CT HEAD LTD  09/28/2022   IR TRANSCATH/EMBOLIZ  09/28/2022   IR US GUIDE VASC ACCESS RIGHT  06/03/2021   IR US GUIDE VASC ACCESS RIGHT  09/07/2022   IR US GUIDE VASC ACCESS RIGHT  09/28/2022   IR US GUIDE VASC ACCESS RIGHT  09/28/2022   RADIOLOGY WITH ANESTHESIA N/A 09/28/2022   Procedure: Embolization of emissary vein;  Surgeon: Baldemar Lenis, MD;  Location: [facility] OR;  Service: Radiology;  Laterality: N/A;   ROTATOR CUFF REPAIR  07/2015   LEFT   SPERMATOCELECTOMY  1990's   RT side   SPERMATOCELECTOMY Left 07/20/2015   Procedure: LEFT HYDROCELECTOMY;  Surgeon: Bjorn Pippin, MD;  Location: [facility] ORS;  Service: Urology;  Laterality: Left;   TRANSURETHRAL INCISION OF PROSTATE N/A 07/20/2015   Procedure: TRANSURETHRAL INCISION OF THE PROSTATE (TUIP);  Surgeon: Bjorn Pippin, MD;  Location: [facility] OR[facility];  Service: Urology;  Laterality: N/A;    ALLERGIES: Allergies  Allergen Reactions   Gabapentin Nausea And Vomiting   Tomato Rash    FAMILY HISTORY:   Family History  Problem Relation Age of Onset   Heart attack Mother    Cerebral aneurysm Father    Cerebral aneurysm Other    Diabetes Other    Hypertension Other     SOCIAL HISTORY: Social History   Socioeconomic History   Marital status: Divorced    Spouse name: Not on file   Number of children: Not on file   Years of education: Not on file   Highest  education level: Not on file  Occupational History   Not on file  Tobacco Use   Smoking status: Former    Current packs/day: 0.00    Average packs/day: 1 pack/day for 30.0 years (30.0 ttl pk-yrs)    Types: Cigarettes    Start date: 11/13/1964    Quit date: 11/13/1994    Years since quitting: 28.9   Smokeless tobacco: Never  Vaping Use   Vaping status: Never Used  Substance and Sexual Activity   Alcohol use: Yes    Comment: Occasional   Drug use: No   Sexual activity: Not Currently  Other Topics Concern   Not on file  Social History Narrative   Lives alone   Right Handed   Drinks 2-3 cups caffeine daily   Social Determinants of Health   Financial Resource Strain: Low Risk  (09/27/2023)   [facility]   Overall Financial Resource Strain (CARDIA)    Difficulty of Paying Living Expenses: Not hard at all  Food Insecurity: No Food Insecurity (09/27/2023)   Received from [facility]   Hunger Vital Sign    Worried About Running Out of Food in the Last Year: Never true    Ran Out of Food in the Last Year: Never true  Transportation Needs: No Transportation Needs (09/27/2023)   Received [facility]   PRAPARE - Transportation    Lack of Transportation (Medical): No    Lack of Transportation (Non-Medical): No  Physical Activity: Sufficiently Active (09/27/2023)   Received from [facility]   Exercise Vital Sign    Days of Exercise per Week: 5 days    Minutes of Exercise per Session: 30 min  Stress: No Stress Concern Present (09/27/2023)   Received from [facility] of Occupational [facility] - Occupational Stress Questionnaire    Feeling of Stress : Not at all  Social Connections: Unknown (09/27/2023)   Received from [facility]   Social Connection and Isolation Panel [NHANES]    Frequency of Communication with Friends and Family: More than three times a week    Frequency of Social Gatherings with Friends and Family: Three times a week    Attends Religious Services: 1 to 4 times per year    Active Member of Clubs or Organizations: No    Attends Banker Meetings: 1 to 4 times per year    Marital Status: Not on file    MEDICATIONS:  Current Outpatient Medications  Medication Sig Dispense Refill   amLODipine (NORVASC) 10 MG tablet TAKE 1 TABLET BY MOUTH ONCE DAILY IN THE MORNING 90 tablet 3   aspirin EC 81 MG tablet Take 1 tablet (81 mg total) by mouth daily. Swallow whole. 30 tablet 12   atorvastatin (LIPITOR) 80 MG tablet Take 80 mg by mouth at bedtime.     BD PEN NEEDLE NANO 2ND GEN 32G X 4 MM MISC USE WITH LANTUS SOLOSTAR TWICE DAILY     BINAXNOW COVID-19 AG HOME TEST KIT Use as Directed on the Package     busPIRone (BUSPAR) 5 MG tablet Take 5 mg by mouth 3 (three) times daily.     Carboxymethylcellul-Glycerin (CLEAR EYES FOR DRY EYES OP) Place 1 drop into both eyes daily as needed (dry eyes).     carvedilol (COREG) 25 MG tablet Take 1 tablet by mouth twice daily 180 tablet 1   clopidogrel (PLAVIX) 75 MG tablet Take 1 tablet (75 mg  total) by mouth daily. 30 tablet 3   Continuous Glucose Sensor (DEXCOM G7 SENSOR) MISC 1 Device by Does not apply route continuous. 9 each 3   diclofenac Sodium (VOLTAREN) 1 % GEL Apply 1 Application topically 4 (four) times daily as needed (pain).     diphenhydrAMINE HCl, Sleep, (ZZZQUIL) 50 MG/30ML LIQD Take 50 mg by mouth at bedtime as needed (sleep).     DULoxetine (CYMBALTA) 60 MG capsule Take 60 mg by mouth daily.     empagliflozin (JARDIANCE) 10 MG TABS tablet Take 1 tablet by mouth daily.      famotidine (PEPCID) 40 MG tablet Take 40 mg by mouth at bedtime.     ferrous sulfate 325 (65 FE) MG tablet Take 325 mg by mouth 2 (two) times daily.     Garlic (GARLIQUE PO) Take 1 tablet by mouth at bedtime.     glipiZIDE (GLUCOTROL) 5 MG tablet Take 10 mg by mouth 2 (two) times daily.     insulin glargine (LANTUS) 100 UNIT/ML injection Inject 65 Units into the skin 2 (two) times daily.     isosorbide mononitrate (IMDUR) 30 MG 24 hr tablet Take 1 tablet by mouth once daily 90 tablet 2   lisinopril (ZESTRIL) 40 MG tablet Take 1 tablet (40 mg total) by mouth daily. 90 tablet 3   loratadine (CLARITIN) 10 MG tablet Take 10 mg by mouth daily.     metFORMIN (GLUCOPHAGE) 1000 MG tablet Take 1,000 mg by mouth 2 (two) times daily.     montelukast (SINGULAIR) 10 MG tablet Take 10 mg by mouth at bedtime.     nitroGLYCERIN (NITROSTAT) 0.4 MG SL tablet Place 1 tablet (0.4 mg total) under the tongue every 5 (five) minutes as needed for chest pain. 25 tablet 3   Omega-3 Fatty Acids (FISH OIL) 1000 MG CAPS Take 1,000 mg by mouth at bedtime.     pantoprazole (PROTONIX) 40 MG tablet Take 40 mg by mouth at bedtime.     spironolactone (ALDACTONE) 25 MG tablet Take 1 tablet by mouth once daily 90 tablet 3   tirzepatide (MOUNJARO) 2.5 MG/0.5ML Pen Inject 2.5 mg into the skin once a week for 4 weeks and then increase to 5 mg weekly. 2 mL 4   No current facility-administered medications for this visit.   Facility-Administered Medications Ordered in Other Visits  Medication Dose Route Frequency Provider Last Rate Last Admin   insulin aspart (novoLOG) injection 0-15 Units  0-15 Units Subcutaneous TID WC de Melchor Amour, Jerilynn Mages, MD        PHYSICAL EXAM: Vitals:   10/02/23 1434 10/02/23 1435  BP: (!) 150/82 (!) 140/90  Pulse: 78   Resp: 20   SpO2: 96%   Weight: 228 lb (103.4 kg)   Height: 5' 10.5" (1.791 m)    Body mass index is 32.25 kg/m.  Wt Readings from Last 3 Encounters:  10/02/23 228 lb  (103.4 kg)  06/12/23 227 lb (103 kg)  03/21/23 223 lb (101.2 kg)    General: Well developed, well nourished male in no apparent distress.  HEENT: AT/[place], no external lesions.  Eyes: Conjunctiva clear and no icterus. Neck: Neck supple  Lungs: Respirations not labored Neurologic: Alert, oriented, normal speech Extremities / Skin: Dry. Psychiatric: Does not appear depressed or anxious  Diabetic Foot Exam - Simple   Simple Foot Form Diabetic Foot exam was performed with the following findings: Yes 10/02/2023 10:00 PM  Visual Inspection Sensation Testing Intact to touch and monofilament testing  bilaterally: Yes Pulse Check See comments: Yes Comments DP pulses palpable bilaterally.     LABS Reviewed Lab Results  Component Value Date   HGBA1C 10.4 (H) 11/26/2018   HGBA1C 8.9 (H) 08/26/2016   HGBA1C 7.1 (H) 09/12/2011   No results found for: "FRUCTOSAMINE" Lab Results  Component Value Date   CHOL 110 10/18/2022   HDL 36 (L) 10/18/2022   LDLCALC 40 10/18/2022   TRIG 172 (H) 10/18/2022   CHOLHDL 3.1 10/18/2022   No results found for: "MICRALBCREAT" Lab Results  Component Value Date   CREATININE 1.16 10/18/2022   No results found for: "GFR"  ASSESSMENT / PLAN  1. Type 2 diabetes mellitus with other specified complication, with long-term current use of insulin (HCC)     Diabetes Mellitus type 2, complicated by diabetic neuropathy/nephropathy/microalbuminuria. - Diabetic status / severity: Uncontrolled.  Lab Results  Component Value Date   HGBA1C 10.4 (H) 11/26/2018   He had recent hemoglobin A1c of 8.7% on September 27, 2023 in outside facility.  - Hemoglobin A1c goal : <7%  Discussed about importance of controlled diabetes mellitus to prevent chronic complications.  He has no contraindication for GLP-1 Receptor Agonist.  - Medications: See below.  I) continue Lantus 60 units 2 times a day. II) continue Jardiance 25 mg daily. III continue glipizide 10 mg 2  times a day. IV) continue metformin 1000 mg 2 times a day. V) start Mounjaro 2.5 mg weekly for 4 weeks and increase to 5 mg weekly.  Check cost with the pharmacy.  - Home glucose testing: Before meals and bedtime.  Advised to check more than once a day.  Prescription for Dexcom G7 sent to the pharmacy.  Patient is asked to bring glucometer in the follow-up visit. - Discussed/ Gave Hypoglycemia treatment plan.  # Consult : not required at this time.   # Annual urine for microalbuminuria/ creatinine ratio, no microalbuminuria currently, continue ACE/ARB /lisinopril.  He had elevated albumin/creatinine ratio 311 recently in November 2024 in outside facility reviewed in Care Everywhere. Last No results found for: "MICRALBCREAT"  # Foot check nightly / neuropathy.  # Annual dilated diabetic eye exams.   - Diet: Make healthy diabetic food choices - Life style / activity / exercise: Discussed.  2. Blood pressure  -  BP Readings from Last 1 Encounters:  10/02/23 (!) 140/90    - Control is in target.  - No change in current plans.  3. Lipid status / Hyperlipidemia - Last  Lab Results  Component Value Date   LDLCALC 40 10/18/2022   - Continue atorvastatin 80 mg daily.  Managed by PCP.  Diagnoses and all orders for this visit:  Type 2 diabetes mellitus with other specified complication, with long-term current use of insulin (HCC)  Other orders -     tirzepatide [facility]) 2.5 MG/0.5ML Pen; Inject 2.5 mg into the skin once a week for 4 weeks and then increase to 5 mg weekly. -     Continuous Glucose Sensor (DEXCOM G7 SENSOR) MISC; 1 Device by Does not apply route continuous.    DISPOSITION Follow up in clinic in 2 months suggested.   All questions answered and patient verbalized understanding of the plan.  Matthew Yossi Hinchman, MD [facility] [address] [place], [address] [place], [place] [address] Phone # [phone]  At least part of this note was  generated using voice recognition software. Inadvertent word errors may have occurred, which were not recognized during the proofreading  process.

## 2023-10-04 ENCOUNTER — Encounter: Payer: 59 | Attending: Family Medicine | Admitting: Nutrition

## 2023-10-04 DIAGNOSIS — Z794 Long term (current) use of insulin: Secondary | ICD-10-CM | POA: Insufficient documentation

## 2023-10-04 DIAGNOSIS — Z713 Dietary counseling and surveillance: Secondary | ICD-10-CM | POA: Insufficient documentation

## 2023-10-04 DIAGNOSIS — Z7984 Long term (current) use of oral hypoglycemic drugs: Secondary | ICD-10-CM | POA: Insufficient documentation

## 2023-10-04 DIAGNOSIS — E119 Type 2 diabetes mellitus without complications: Secondary | ICD-10-CM | POA: Diagnosis not present

## 2023-10-04 DIAGNOSIS — Z8673 Personal history of transient ischemic attack (TIA), and cerebral infarction without residual deficits: Secondary | ICD-10-CM | POA: Diagnosis not present

## 2023-10-04 DIAGNOSIS — E669 Obesity, unspecified: Secondary | ICD-10-CM | POA: Diagnosis not present

## 2023-10-04 NOTE — Progress Notes (Signed)
Medical Nutrition Therapy  Appointment Start time:  1530   Appointment End time 1600   Primary concerns today: DM Type 2, MI, CVA, Obesity  Referral diagnosis: E11.8, I21.9, I63.5, E66.9 Preferred learning style: NO preference  Learning readiness: Changes in process   NUTRITION ASSESSMENT  Dm Follow up Changes made: Sees Dr/ Jules Husbands now. 65 units of Lantus BID, Metformin 1000 mg BID and Glipizide 10 mg BID. Mourjero took it for the first time yesterday. Endo MD now is Dr. Erroll Luna, He is having low blood sugars between meals and having to eat to bring up blood sugars. Suggested stopping his Glipizide since he is on Ozempic and he can check with Endocrinologist.  He is working on eating more fruits, vegetables and whole grains. 67 yr old bmale referred by Dr. Allena Katz for diabetes education. PMH: MI, CVA, HTN, OBesity, Type 2 Dm, CKD PCP Dr. Jules Husbands in [place]  He would like to be referred to Dr. Fransico Him, Endocrinologist to see if he can't reduce his insulin and improve his DM Mgt.  He would benefit from a CGM to better help him manage his DM.  Anthropometrics  Wt Readings from Last 3 Encounters:  10/02/23 228 lb (103.4 kg)  06/12/23 227 lb (103 kg)  03/21/23 223 lb (101.2 kg)   Ht Readings from Last 3 Encounters:  10/02/23 5' 10.5" (1.791 m)  06/12/23 5' 10.5" (1.791 m)  03/21/23 5' 10.5" (1.791 m)   There is no height or weight on file to calculate BMI. @BMIFA @ Facility age limit for growth %iles is 20 years. Facility age limit for growth %iles is 20 years.    Clinical Medical Hx: See chart Medications: See chart Labs: A1C 8.4% Notable Signs/Symptoms: None  Lifestyle & Dietary Hx Lives by himself.  Estimated daily fluid intake: 100 oz Supplements: Garlic, Vit C Sleep: varies Stress / self-care: no problems Current average weekly physical activity: walks  24-Hr Dietary Recall Eating 2-3 meals per day. Trying to work on eating healthier foods.  Estimated Energy  Needs Calories: 1800 Carbohydrate: 200g Protein: 135g Fat: 50g   NUTRITION DIAGNOSIS  NB-1.1 Food and nutrition-related knowledge deficit As related to Dm Type 2.  As evidenced by A1c 8.4%.[name]   NUTRITION INTERVENTION  Nutrition education (E-1) on the following topics:  Nutrition and Diabetes education provided on My Plate, CHO counting, meal planning, portion sizes, timing of meals, avoiding snacks between meals unless having a low blood sugar, target ranges for A1C and blood sugars, signs/symptoms and treatment of hyper/hypoglycemia, monitoring blood sugars, taking medications as prescribed, benefits of exercising 30 minutes per day and prevention of complications of DM. Lifestyle Medicine  - Whole Food, Plant Predominant Nutrition is highly recommended: Eat Plenty of vegetables, Mushrooms, fruits, Legumes, Whole Grains, Nuts, seeds in lieu of processed meats, processed snacks/pastries red meat, poultry, eggs.    -It is better to avoid simple carbohydrates including: Cakes, Sweet Desserts, Ice Cream, Soda (diet and regular), Sweet Tea, Candies, Chips, Cookies, Store Bought Juices, Alcohol in Excess of  1-2 drinks a day, Lemonade,  Artificial Sweeteners, Doughnuts, Coffee Creamers, "Sugar-free" Products, etc, etc.  This is not a complete list.....  Exercise: If you are able: 30 -60 minutes a day ,4 days a week, or 150 minutes a week.  The longer the better.  Combine stretch, strength, and aerobic activities.  If you were told in the past that you have high risk for cardiovascular diseases, you may seek evaluation by your heart doctor prior to initiating moderate  to intense exercise programs.   Handouts Provided Include  LIfestyle Medicine handouts  Learning Style & Readiness for Change Teaching method utilized: Visual & Auditory  Demonstrated degree of understanding via: Teach Back  Barriers to learning/adherence to lifestyle change: none  Goals Established by Pt GOals Stop Glipizide  and check with Dr. Erroll Luna about medication adjustments due to low blood sugars.  Change foods to vegetables, fruits and whole grains with fish or chicken. Try to get plant butter Eat whole plants based foods Cut out processed foods Let  your FOODS be your MEDICINE. Walk 30 minutes a day Get A1C to 6.5% Choose high fiber foods   MONITORING & EVALUATION Dietary intake, weekly physical activity, and weight in 3 month. Stop glipizide due to low blood sugars Next Steps  Patient is to work on eating more whole plant based foods. Talk to PCP about request for referral to see Dr. Fransico Him, Endocrinologist..

## 2023-10-05 ENCOUNTER — Telehealth: Payer: Self-pay

## 2023-10-05 ENCOUNTER — Other Ambulatory Visit: Payer: Self-pay | Admitting: Endocrinology

## 2023-10-05 MED ORDER — GLIPIZIDE 5 MG PO TABS: 5.0000 mg | ORAL_TABLET | Freq: Two times a day (BID) | ORAL | 4 refills | Status: DC

## 2023-10-05 NOTE — Telephone Encounter (Signed)
Patient called to adjust medication dosage of glipizide from 10mg  BID to 5mg  BID as directed by MD to help mediate the low blood glucose levels patient has been having.

## 2023-10-08 ENCOUNTER — Encounter: Payer: Self-pay | Admitting: Nutrition

## 2023-10-08 NOTE — Patient Instructions (Signed)
Goals  Change foods to vegetables, fruits and whole grains with fish or chicken. Try to get plant butter Eat whole plants based foods Cut out processed foods Let  your FOODS be your MEDICINE. Walk 30 minutes a day Get A1C to 6.5% Choose high fiber foods

## 2023-10-24 ENCOUNTER — Other Ambulatory Visit: Payer: Self-pay

## 2023-10-24 DIAGNOSIS — Z794 Long term (current) use of insulin: Secondary | ICD-10-CM

## 2023-10-24 MED ORDER — TIRZEPATIDE 5 MG/0.5ML ~~LOC~~ SOAJ: SUBCUTANEOUS | 3 refills | Status: DC

## 2023-10-25 ENCOUNTER — Ambulatory Visit: Payer: 59 | Admitting: Nutrition

## 2023-12-11 ENCOUNTER — Encounter: Payer: Self-pay | Admitting: Endocrinology

## 2023-12-11 ENCOUNTER — Ambulatory Visit (INDEPENDENT_AMBULATORY_CARE_PROVIDER_SITE_OTHER): Payer: 59 | Admitting: Endocrinology

## 2023-12-11 VITALS — BP 110/60 | HR 86 | Resp 20 | Ht 70.5 in | Wt 225.0 lb

## 2023-12-11 DIAGNOSIS — Z794 Long term (current) use of insulin: Secondary | ICD-10-CM | POA: Diagnosis not present

## 2023-12-11 DIAGNOSIS — E1169 Type 2 diabetes mellitus with other specified complication: Secondary | ICD-10-CM | POA: Diagnosis not present

## 2023-12-11 MED ORDER — DEXCOM G7 SENSOR MISC: 1.0000 | 3 refills | Status: DC

## 2023-12-11 MED ORDER — EMPAGLIFLOZIN 25 MG PO TABS: 25.0000 mg | ORAL_TABLET | Freq: Every day | ORAL | 3 refills | Status: DC

## 2023-12-11 MED ORDER — TIRZEPATIDE 7.5 MG/0.5ML ~~LOC~~ SOAJ: 7.5000 mg | SUBCUTANEOUS | 4 refills | Status: DC

## 2023-12-11 MED ORDER — INSULIN GLARGINE 100 UNIT/ML SOLOSTAR PEN: 45.0000 [IU] | PEN_INJECTOR | Freq: Every day | SUBCUTANEOUS | 3 refills | Status: AC

## 2023-12-11 MED ORDER — METFORMIN HCL 1000 MG PO TABS: 1000.0000 mg | ORAL_TABLET | Freq: Two times a day (BID) | ORAL | 3 refills | Status: AC

## 2023-12-11 NOTE — Patient Instructions (Addendum)
Lantus 45 units daily.  Jardiance 25mg  daily. Stay on Metformin 1000 mg two times a day.  Increase Moujaro 5 mg weekly to 7.5 mg weekly.   Stop glipizide.

## 2023-12-11 NOTE — Progress Notes (Addendum)
Outpatient Endocrinology Note Matthew Wanda Cellucci, MD  12/11/23  Patient's Name: Matthew Cain    DOB: 18-Dec-1955    MRN: [id number]                                                    REASON OF VISIT: Follow-up of type 2 diabetes mellitus  REFERRING PROVIDER: Catalina Lunger, DO  PCP: Catalina Lunger, DO  HISTORY OF PRESENT ILLNESS:   Matthew Cain is a 68 y.o. old male with past medical history listed below, is here for follow-up for type 2 diabetes mellitus.   Pertinent Diabetes History: Patient was diagnosed with type 2 diabetes mellitus in his age of 30s.  Patient has uncontrolled type 2 diabetes mellitus, was initially seen in November 2024. Patient has been on insulin therapy for several years.  He has hemoglobin A1c mostly in the range of 8%.  Used to be as high as 10 to 12% in the past. He had seen diabetic educator in the July 2024.  No history of diabetes ketoacidosis and hospitalization due to diabetes related condition.  No history of pancreatitis.  No family history of medullary thyroid cancer, MEN 2 syndrome.  Chronic Diabetes Complications : Retinopathy: ? no. Last ophthalmology exam was done on annually, following with ophthalmology regularly.  Nephropathy: Yes, microalbuminuria, on ACE/ARB / lisinopril Peripheral neuropathy: yes, tingling of toes.  Coronary artery disease: CAD Stroke: no  Relevant comorbidities and cardiovascular risk factors: Obesity: yes Body mass index is 31.83 kg/m.  Hypertension: Yes  Hyperlipidemia : Yes, on statin   Current / Home Diabetic regimen includes:  Lantus 50 units daily in the morning.  Jardiance 25mg  daily. Glipizide 5 mg daily.  Metformin 1000 mg daily.  Mounjaro 5 mg weekly.   Prior diabetic medications: On the higher dose of Lantus up to 65 units 2 times a day in the past, gradually decreased after starting Mounjaro.  Glycemic data:    CONTINUOUS GLUCOSE MONITORING SYSTEM (CGMS) INTERPRETATION:                         Dexcom G7  CGM-  Sensor Download (Sensor download was reviewed and summarized below.) Dates: January 15 of December 11, 2023, 14 days   Glucose Management Indicator: 6.8%  Sensor usage:95 %    Impression: Mostly acceptable blood sugar with occasional mild hyperglycemia with blood sugar up to 200-250 range related with meals.  Blood sugar overnight acceptable and occasionally trending down blood sugar in the early morning, no hypoglycemia.  Hypoglycemia: Patient has no hypoglycemic episodes. Patient has hypoglycemia awareness.  Factors modifying glucose control: 1.  Diabetic diet assessment: 2-3 meals a day. Occasional candy and soda.  Decreased appetite after being on Mounjaro.  2.  Staying active or exercising: no formal exercise.   3.  Medication compliance: compliant all of the time.  Interval history  Patient has been able to decrease insulin significantly, currently taking Lantus 50 units daily, used to take up to 65 units 2 times a day in the past.  He also decreases glipizide and metformin, currently taking once a day.  Diabetes regimen reviewed and as noted above.  Dexcom CGM data as reviewed and noted above.  He has significant improvement on blood sugar, GMI on CGM 6.8 percent.  He had hemoglobin A1c of 8.7% in  November. Denies numbness and tingling of the feet.  No other complaints today.  Congratulated him for improvement of his diabetes.  He has no issues with Mounjaro, currently taking 5 mg weekly.  He has significant improvement on diet also reports decreased appetite after being on Mounjaro.  Lost about 5 to 10 pound of weight in last 3 months, at home scale.  REVIEW OF SYSTEMS As per history of present illness.   PAST MEDICAL HISTORY: Past Medical History:  Diagnosis Date   Amputation finger    left ring finger   Anxiety    CAD (coronary artery disease)    LHC 10/12:  mild plaque in the LAD but no obstructive CAD   Chronic diastolic heart failure (HCC)    EF 55-60%,  grade 1 diastolic dysfunction; no WMAs, echo, 11/2011; s/p pulmo. edema c/b VDRF 11/12; Echo 09/08/11: severe LVH, no SAM, no LVOT gradient, EF 55%, mild BAE, mild reduced RVSF.   CKD (chronic kidney disease)    Depression    Diabetes mellitus    Type II   GERD (gastroesophageal reflux disease)    History of kidney stones    History of right ACA stroke    10/12  --  MRI 09/13/11:  Acute right ACA infarct involving corpus callosum.  MRA 09/13/11: severe intracranial ASD, right ACA occluded, left PCA and right MCA with severe disease.  Dopplers were neg for significant ICA stenosis   HLD (hyperlipidemia)    Hypertension    Renal insufficiency    Spermatocele    Thoracic aortic aneurysm (HCC)    CT in 10/12: 4.4 cm ascending thoracic aortic aneurysm    PAST SURGICAL HISTORY: Past Surgical History:  Procedure Laterality Date    vasectomy     bilateral   CARDIAC CATHETERIZATION  09/09/11   IR ANGIO EXTERNAL CAROTID SEL EXT CAROTID BILAT MOD SED  09/07/2022   IR ANGIO EXTERNAL CAROTID SEL EXT CAROTID UNI R MOD SED  06/03/2021   IR ANGIO INTRA EXTRACRAN SEL COM CAROTID INNOMINATE UNI L MOD SED  06/03/2021   IR ANGIO INTRA EXTRACRAN SEL INTERNAL CAROTID BILAT MOD SED  09/07/2022   IR ANGIO INTRA EXTRACRAN SEL INTERNAL CAROTID UNI R MOD SED  06/03/2021   IR ANGIO VERTEBRAL SEL VERTEBRAL BILAT MOD SED  06/03/2021   IR ANGIO VERTEBRAL SEL VERTEBRAL UNI R MOD SED  09/08/2022   IR ANGIOGRAM FOLLOW UP STUDY  09/28/2022   IR ANGIOGRAM FOLLOW UP STUDY  09/28/2022   IR ANGIOGRAM FOLLOW UP STUDY  09/28/2022   IR ANGIOGRAM FOLLOW UP STUDY  09/28/2022   IR ANGIOGRAM FOLLOW UP STUDY  09/28/2022   IR ANGIOGRAM FOLLOW UP STUDY  09/28/2022   IR ANGIOGRAM FOLLOW UP STUDY  09/28/2022   IR ANGIOGRAM FOLLOW UP STUDY  09/28/2022   IR ANGIOGRAM FOLLOW UP STUDY  09/28/2022   IR ANGIOGRAM FOLLOW UP STUDY  09/28/2022   IR CT HEAD LTD  09/28/2022   IR TRANSCATH/EMBOLIZ  09/28/2022   IR US GUIDE VASC ACCESS RIGHT   06/03/2021   IR US GUIDE VASC ACCESS RIGHT  09/07/2022   IR US GUIDE VASC ACCESS RIGHT  09/28/2022   IR US GUIDE VASC ACCESS RIGHT  09/28/2022   RADIOLOGY WITH ANESTHESIA N/A 09/28/2022   Procedure: Embolization of emissary vein;  Surgeon: Baldemar Lenis, MD;  Location: [facility] OR;  Service: Radiology;  Laterality: N/A;   ROTATOR CUFF REPAIR  07/2015   LEFT   SPERMATOCELECTOMY  1990's  RT side   SPERMATOCELECTOMY Left 07/20/2015   Procedure: LEFT HYDROCELECTOMY;  Surgeon: Bjorn Pippin, MD;  Location: [facility] ORS;  Service: Urology;  Laterality: Left;   TRANSURETHRAL INCISION OF PROSTATE N/A 07/20/2015   Procedure: TRANSURETHRAL INCISION OF THE PROSTATE (TUIP);  Surgeon: Bjorn Pippin, MD;  Location: [facility] OR[facility];  Service: Urology;  Laterality: N/A;    ALLERGIES: Allergies  Allergen Reactions   Gabapentin Nausea And Vomiting   Tomato Rash    FAMILY HISTORY:  Family History  Problem Relation Age of Onset   Heart attack Mother    Cerebral aneurysm Father    Cerebral aneurysm Other    Diabetes Other    Hypertension Other     SOCIAL HISTORY: Social History   Socioeconomic History   Marital status: Divorced    Spouse name: Not on file   Number of children: Not on file   Years of education: Not on file   Highest education level: Not on file  Occupational History   Not on file  Tobacco Use   Smoking status: Former    Current packs/day: 0.00    Average packs/day: 1 pack/day for 30.0 years (30.0 ttl pk-yrs)    Types: Cigarettes    Start date: 11/13/1964    Quit date: 11/13/1994    Years since quitting: 29.0   Smokeless tobacco: Never  Vaping Use   Vaping status: Never Used  Substance and Sexual Activity   Alcohol use: Yes    Comment: Occasional   Drug use: No   Sexual activity: Not Currently  Other Topics Concern   Not on file  Social History Narrative   Lives alone   Right Handed   Drinks 2-3 cups caffeine daily   Social Drivers of Health   Financial Resource Strain: Low  Risk  (09/27/2023)   Received from [facility]   Overall Financial Resource Strain (CARDIA)    Difficulty of Paying Living Expenses: Not hard at all  Food Insecurity: No Food Insecurity (09/27/2023)   Received from [facility]   Hunger Vital Sign    Worried About Running Out of Food in the Last Year: Never true    Ran Out of Food in the Last Year: Never true  Transportation Needs: No Transportation Needs (09/27/2023)   Received from [facility]   PRAPARE - Transportation    Lack of Transportation (Medical): No    Lack of Transportation (Non-Medical): No  Physical Activity: Sufficiently Active (09/27/2023)   Received from [facility]   Exercise Vital Sign    Days of Exercise per Week: 5 days    Minutes of Exercise per Session: 30 min  Stress: No Stress Concern Present (09/27/2023)   Received from [facility] of Occupational Health - Occupational Stress Questionnaire    Feeling of Stress : Not at all  Social Connections: Unknown (09/27/2023)   Received from [facility]   Social Connection and Isolation Panel [NHANES]    Frequency of Communication with Friends and Family: More than three times a week    Frequency of Social Gatherings with Friends and Family: Three times a week    Attends Religious Services: 1 to 4 times per year    Active Member of Clubs or Organizations: No    Attends Banker Meetings: 1 to 4 times per year    Marital Status: Not on file    MEDICATIONS:  Current Outpatient Medications  Medication Sig Dispense Refill  amLODipine (NORVASC) 10 MG tablet TAKE 1 TABLET BY MOUTH ONCE DAILY IN THE MORNING 90 tablet 3   aspirin EC 81 MG tablet Take 1 tablet (81 mg total) by mouth daily. Swallow whole. 30 tablet 12   atorvastatin (LIPITOR) 80 MG tablet Take 80 mg by mouth at bedtime.     BD PEN NEEDLE NANO 2ND GEN 32G X 4 MM MISC USE WITH LANTUS SOLOSTAR TWICE DAILY     BINAXNOW COVID-19 AG HOME TEST KIT Use as  Directed on the Package     busPIRone (BUSPAR) 5 MG tablet Take 5 mg by mouth 3 (three) times daily.     Carboxymethylcellul-Glycerin (CLEAR EYES FOR DRY EYES OP) Place 1 drop into both eyes daily as needed (dry eyes).     carvedilol (COREG) 25 MG tablet Take 1 tablet by mouth twice daily 180 tablet 1   clopidogrel (PLAVIX) 75 MG tablet Take 1 tablet (75 mg total) by mouth daily. 30 tablet 3   diclofenac Sodium (VOLTAREN) 1 % GEL Apply 1 Application topically 4 (four) times daily as needed (pain).     diphenhydrAMINE HCl, Sleep, (ZZZQUIL) 50 MG/30ML LIQD Take 50 mg by mouth at bedtime as needed (sleep).     DULoxetine (CYMBALTA) 60 MG capsule Take 60 mg by mouth daily.     famotidine (PEPCID) 40 MG tablet Take 40 mg by mouth at bedtime.     ferrous sulfate 325 (65 FE) MG tablet Take 325 mg by mouth 2 (two) times daily.     Garlic (GARLIQUE PO) Take 1 tablet by mouth at bedtime.     insulin glargine (LANTUS) 100 UNIT/ML Solostar Pen Inject 45 Units into the skin daily. 30 mL 3   isosorbide mononitrate (IMDUR) 30 MG 24 hr tablet Take 1 tablet by mouth once daily 90 tablet 2   lisinopril (ZESTRIL) 40 MG tablet Take 1 tablet (40 mg total) by mouth daily. 90 tablet 3   loratadine (CLARITIN) 10 MG tablet Take 10 mg by mouth daily.     montelukast (SINGULAIR) 10 MG tablet Take 10 mg by mouth at bedtime.     nitroGLYCERIN (NITROSTAT) 0.4 MG SL tablet Place 1 tablet (0.4 mg total) under the tongue every 5 (five) minutes as needed for chest pain. 25 tablet 3   Omega-3 Fatty Acids (FISH OIL) 1000 MG CAPS Take 1,000 mg by mouth at bedtime.     pantoprazole (PROTONIX) 40 MG tablet Take 40 mg by mouth at bedtime.     spironolactone (ALDACTONE) 25 MG tablet Take 1 tablet by mouth once daily 90 tablet 3   Continuous Glucose Sensor (DEXCOM G7 SENSOR) MISC 1 Device by Does not apply route continuous. 9 each 3   empagliflozin (JARDIANCE) 25 MG TABS tablet Take 1 tablet (25 mg total) by mouth daily. 90 tablet 3    metFORMIN (GLUCOPHAGE) 1000 MG tablet Take 1 tablet (1,000 mg total) by mouth 2 (two) times daily. 180 tablet 3   tirzepatide (MOUNJARO) 7.5 MG/0.5ML Pen Inject 7.5 mg into the skin once a week. 6 mL 4   No current facility-administered medications for this visit.   Facility-Administered Medications Ordered in Other Visits  Medication Dose Route Frequency Provider Last Rate Last Admin   insulin aspart (novoLOG) injection 0-15 Units  0-15 Units Subcutaneous TID WC de Melchor Amour, Jerilynn Mages, MD        PHYSICAL EXAM: Vitals:   12/11/23 0950  BP: 110/60  Pulse: 86  Resp: 20  SpO2: 97%  Weight: 225 lb (102.1 kg)  Height: 5' 10.5" (1.791 m)   Body mass index is 31.83 kg/m.  Wt Readings from Last 3 Encounters:  12/11/23 225 lb (102.1 kg)  10/04/23 227 lb (103 kg)  10/02/23 228 lb (103.4 kg)    General: Well developed, well nourished male in no apparent distress.  HEENT: AT/[place], no external lesions.  Eyes: Conjunctiva clear and no icterus. Neck: Neck supple  Lungs: Respirations not labored Neurologic: Alert, oriented, normal speech Extremities / Skin: Dry. Psychiatric: Does not appear depressed or anxious  Diabetic Foot Exam - Simple   No data filed    LABS Reviewed Lab Results  Component Value Date   HGBA1C 10.4 (H) 11/26/2018   HGBA1C 8.9 (H) 08/26/2016   HGBA1C 7.1 (H) 09/12/2011   No results found for: "FRUCTOSAMINE" Lab Results  Component Value Date   CHOL 110 10/18/2022   HDL 36 (L) 10/18/2022   LDLCALC 40 10/18/2022   TRIG 172 (H) 10/18/2022   CHOLHDL 3.1 10/18/2022   No results found for: "MICRALBCREAT" Lab Results  Component Value Date   CREATININE 1.16 10/18/2022   No results found for: "GFR"  ASSESSMENT / PLAN  1. Type 2 diabetes mellitus with other specified complication, with long-term current use of insulin (HCC)    Diabetes Mellitus type 2, complicated by diabetic neuropathy/nephropathy/microalbuminuria. - Diabetic status / severity:  Uncontrolled.  Lab Results  Component Value Date   HGBA1C 10.4 (H) 11/26/2018   He had recent hemoglobin A1c of 8.7% on September 27, 2023 in outside facility.  GMI on CGM 6.8% today.  - Hemoglobin A1c goal : <7%  Significant improvement on diabetes control. Congratulated him.  Will gradually increase dose of Mounjaro and decrease insulin and adjusted diabetes regimen as follows. - Medications: See below.  I) decrease Lantus from 50 to 45 units daily in the morning. II) continue Jardiance 25 mg daily. III stay on metformin 1000 mg 2 times a day. V) increase Mounjaro from 5 to 7.5 mg weekly.  He has 5 mg for about 3 weeks and then when the refill will get higher dose. VI) stop glipizide.  - Home glucose testing: Dexcom G7 and check as needed. - Discussed/ Gave Hypoglycemia treatment plan.  # Consult : not required at this time.   # Annual urine for microalbuminuria/ creatinine ratio, + microalbuminuria currently, continue ACE/ARB /lisinopril.  Will check today, with BMP. Last No results found for: "MICRALBCREAT"  # Foot check nightly / neuropathy.  # Annual dilated diabetic eye exams.   - Diet: Make healthy diabetic food choices.  Encouraged to stay on the diet plan. - Life style / activity / exercise: Discussed.  2. Blood pressure  -  BP Readings from Last 1 Encounters:  12/11/23 110/60    - Control is in target.  - No change in current plans.  3. Lipid status / Hyperlipidemia - Last  Lab Results  Component Value Date   LDLCALC 40 10/18/2022   - Continue atorvastatin 80 mg daily.  Managed by PCP.  Diagnoses and all orders for this visit:  Type 2 diabetes mellitus with other specified complication, with long-term current use of insulin (HCC) -     tirzepatide (MOUNJARO) 7.5 MG/0.5ML Pen; Inject 7.5 mg into the skin once a week. -     empagliflozin (JARDIANCE) 25 MG TABS tablet; Take 1 tablet (25 mg total) by mouth daily. -     insulin glargine (LANTUS) 100  UNIT/ML Solostar Pen; Inject 45  Units into the skin daily. -     metFORMIN (GLUCOPHAGE) 1000 MG tablet; Take 1 tablet (1,000 mg total) by mouth 2 (two) times daily. -     Continuous Glucose Sensor (DEXCOM G7 SENSOR) MISC; 1 Device by Does not apply route continuous. -     BASIC METABOLIC PANEL WITH GFR -     Microalbumin / creatinine urine ratio  Other orders -     Discontinue: tirzepatide (MOUNJARO) 7.5 MG/0.5ML Pen; Inject 7.5 mg into the skin once a week. -     Discontinue: Continuous Glucose Sensor (DEXCOM G7 SENSOR) MISC; 1 Device by Does not apply route continuous.     DISPOSITION Follow up in clinic in 3 months suggested.   All questions answered and patient verbalized understanding of the plan.  Matthew Nitara Szczerba, MD [facility] Endocrinology [facility] [address] [place], [address] [place], [place] [address] Phone # [phone]  At least part of this note was generated using voice recognition software. Inadvertent word errors may have occurred, which were not recognized during the proofreading process.

## 2023-12-12 ENCOUNTER — Encounter: Payer: Self-pay | Admitting: Endocrinology

## 2023-12-12 LAB — BASIC METABOLIC PANEL WITH GFR
BUN: 17 mg/dL (ref 7–25)
CO2: 28 mmol/L (ref 20–32)
Calcium: 9.6 mg/dL (ref 8.6–10.3)
Chloride: 100 mmol/L (ref 98–110)
Creat: 1.27 mg/dL (ref 0.70–1.35)
Glucose, Bld: 145 mg/dL — ABNORMAL HIGH (ref 65–99)
Potassium: 4.2 mmol/L (ref 3.5–5.3)
Sodium: 138 mmol/L (ref 135–146)
eGFR: 62 mL/min/{1.73_m2} (ref >=60)

## 2023-12-12 LAB — MICROALBUMIN / CREATININE URINE RATIO
Creatinine, Urine: 64 mg/dL (ref 20–320)
Microalb Creat Ratio: 44 mg/g{creat} — ABNORMAL HIGH (ref <30)
Microalb, Ur: 2.8 mg/dL

## 2023-12-23 ENCOUNTER — Other Ambulatory Visit: Payer: Self-pay | Admitting: Cardiology

## 2024-01-01 ENCOUNTER — Ambulatory Visit: Payer: 59 | Admitting: Nutrition

## 2024-01-17 ENCOUNTER — Other Ambulatory Visit: Payer: Self-pay | Admitting: Cardiology

## 2024-02-08 ENCOUNTER — Other Ambulatory Visit: Payer: Self-pay | Admitting: Cardiology

## 2024-03-09 ENCOUNTER — Other Ambulatory Visit: Payer: Self-pay | Admitting: Cardiology

## 2024-03-11 ENCOUNTER — Ambulatory Visit: Payer: 59 | Admitting: Endocrinology

## 2024-03-19 ENCOUNTER — Other Ambulatory Visit: Payer: Self-pay | Admitting: Cardiology

## 2024-03-31 NOTE — Progress Notes (Deleted)
  Cardiology Office Note:   Date:  03/31/2024  ID:  Matthew Cain, DOB [birth year], MRN [id number] PCP: Avelina Bode, DO  [facility] Providers Cardiologist:  Eilleen Grates, MD {  History of Present Illness:   Matthew Cain is a 68 y.o. male for evaluation of CAD.   In 2012 he was noted to have mild chest pain with mild CAD as seen on cardiac catheterization.  In 2020 he underwent a nuclear stress test that was negative for ischemia.  Echocardiogram last year revealed normal EF with severe septal hypertrophy, grade 1 DD, was slightly enlarged aortic root.  Cardiac MRI did not reveal septal hypertrophy.   Instead he was thought to have some diffuse hypertrophy probably related to hypertension.  There was no LV outflow structure and or late gadolinium enhancement.   Echo demonstrated a normal EF in December.    Since I last saw him ***    ***Since I last saw him he has been managed for iron deficiency anemia.  There is been no clear etiology and he has had blood transfusions.  He is also had coiling of the cerebral aneurysm.  He feels much better.  He is not having dizziness or tinnitus that he was having.  He is breathing better.  He is not having any new shortness of breath, PND or orthopnea.  He denies any chest pressure, neck or arm discomfort.      ROS: ***  Studies Reviewed:    EKG:       ***  Risk Assessment/Calculations:   {Does this patient have ATRIAL FIBRILLATION?:[id number]} No BP recorded.  {Refresh Note OR Click here to enter BP  :1}***        Physical Exam:   VS:  There were no vitals taken for this visit.   Wt Readings from Last 3 Encounters:  12/11/23 225 lb (102.1 kg)  10/04/23 227 lb (103 kg)  10/02/23 228 lb (103.4 kg)     GEN: Well nourished, well developed in no acute distress NECK: No JVD; No carotid bruits CARDIAC: ***RR, *** murmurs, rubs, gallops RESPIRATORY:  Clear to auscultation without rales, wheezing or rhonchi  ABDOMEN: Soft, non-tender,  non-distended EXTREMITIES:  No edema; No deformity   ASSESSMENT AND PLAN:   CAD: ***  He had a low risk Lexiscan  in 2022.   He had previous mild plaque.  No change in therapy.   HLD: LDL was 40 with an HDL of 36.  Continue meds as listed.   Hx of asymmetric septal hypertrophy, history of diastolic dysfunction:   ***  He actually seems to have global LVH probably related to hypertension.  No further restratification is necessary and he is not having any symptoms.  We will continue with medical management of his blood pressure.   Chronic diastolic heart failure: ***  He seems to be euvolemic.  Continue the meds as listed.   Hypertension: The blood pressure is ***   well-controlled.  Continue the meds as listed.   Thoracic aortic aneurysm:***   He has a stable aneurysm of about 42 mm.  This has been stable over the years and so I probably will skip imaging this next year.   Pulsatile tinnitus: ***  This is now improved.  No change in therapy.        Follow up ***  Signed, Eilleen Grates, MD

## 2024-04-02 ENCOUNTER — Ambulatory Visit: Payer: Medicaid Other | Admitting: Cardiology

## 2024-04-11 ENCOUNTER — Ambulatory Visit (INDEPENDENT_AMBULATORY_CARE_PROVIDER_SITE_OTHER): Admitting: Endocrinology

## 2024-04-11 ENCOUNTER — Ambulatory Visit: Payer: Self-pay | Admitting: Endocrinology

## 2024-04-11 ENCOUNTER — Encounter: Payer: Self-pay | Admitting: Endocrinology

## 2024-04-11 VITALS — BP 110/70 | HR 76 | Resp 16 | Ht 70.5 in | Wt 215.2 lb

## 2024-04-11 DIAGNOSIS — Z794 Long term (current) use of insulin: Secondary | ICD-10-CM | POA: Diagnosis not present

## 2024-04-11 DIAGNOSIS — E1169 Type 2 diabetes mellitus with other specified complication: Secondary | ICD-10-CM | POA: Diagnosis not present

## 2024-04-11 LAB — POCT GLYCOSYLATED HEMOGLOBIN (HGB A1C): Hemoglobin A1C: 7.3 % — AB (ref 4.0–5.6)

## 2024-04-11 MED ORDER — TIRZEPATIDE 10 MG/0.5ML ~~LOC~~ SOAJ: 10.0000 mg | SUBCUTANEOUS | 4 refills | Status: DC

## 2024-04-11 NOTE — Patient Instructions (Addendum)
 Latest Reference Range & Units 11/26/18 06:16 04/11/24 10:11  Hemoglobin A1C 4.0 - 5.6 % 10.4 (H) 7.3 !  (H): Data is abnormally high !: Data is abnormal  Lantus  45 units daily.  Jardiance  25mg  daily. Stay on Metformin  1000 mg two times a day.  Increase Moujaro 7.5 mg weekly to 10 mg weekly.

## 2024-04-11 NOTE — Progress Notes (Signed)
 Outpatient Endocrinology Note Matthew Shateria Paternostro, MD  04/11/24  Patient's Name: Matthew Cain    DOB: Jun 19, 1956    MRN: [id number]                                                    REASON OF VISIT: Follow-up of type 2 diabetes mellitus  REFERRING PROVIDER: Avelina Bode, DO  PCP: Avelina Bode, DO  HISTORY OF PRESENT ILLNESS:   Matthew Cain is a 68 y.o. old male with past medical history listed below, is here for follow-up for type 2 diabetes mellitus.   Pertinent Diabetes History: Patient was diagnosed with type 2 diabetes mellitus in his age of 57s.  Patient has uncontrolled type 2 diabetes mellitus, was initially seen in November 2024. Patient has been on insulin  therapy for several years.  He has hemoglobin A1c mostly in the range of 8%.  Used to be as high as 10 to 12% in the past. He had seen diabetic educator in the July 2024.  No history of diabetes ketoacidosis and hospitalization due to diabetes related condition.  No history of pancreatitis.  No family history of medullary thyroid  cancer, MEN 2 syndrome.  Chronic Diabetes Complications : Retinopathy: ? no. Last ophthalmology exam was done on annually, following with ophthalmology regularly.  Nephropathy: Yes, microalbuminuria, on ACE/ARB / lisinopril  Peripheral neuropathy: yes, tingling of toes.  Coronary artery disease: CAD Stroke: no  Relevant comorbidities and cardiovascular risk factors: Obesity: yes Body mass index is 30.44 kg/m.  Hypertension: Yes  Hyperlipidemia : Yes, on statin   Current / Home Diabetic regimen includes:  Lantus  45 units daily in the morning.  Jardiance  25mg  daily. Metformin  1000 mg daily.  Mounjaro 7.5 mg weekly.   Prior diabetic medications: On the higher dose of Lantus  up to 65 units 2 times a day in the past, gradually decreased after starting Mounjaro.  Glipizide  was stopped in January 2025.  Glycemic data:    CONTINUOUS GLUCOSE MONITORING SYSTEM (CGMS) INTERPRETATION:                          Dexcom G7 CGM-  Sensor Download (Sensor download was reviewed and summarized below.) Dates: May 17 to Apr 11, 2024, 14 days   Glucose Management Indicator: 8.3%  Sensor usage: 95 %    Impression: Frequent hyperglycemia postprandially with meals with blood sugar up to range of 300-350 especially with supper, sometime with lunch and sometime with breakfast.  Blood sugar in the morning fasting trending down.  No hypoglycemia.  Hypoglycemia: Patient has no hypoglycemic episodes. Patient has hypoglycemia awareness.  Factors modifying glucose control: 1.  Diabetic diet assessment: 2-3 meals a day. Occasional candy and soda.  Decreased appetite after being on Mounjaro.  2.  Staying active or exercising: no formal exercise.   3.  Medication compliance: compliant all of the time.  Interval history  She is admitted as reviewed above.  Postprandial hyperglycemia.  Hemoglobin A1c improved to 7.3% congratulated him.  Diabetes regimen as reviewed and noted above.  He has been tolerating Mounjaro well.  Patient reports lately he is having increased appetite.  No other complaints today.  REVIEW OF SYSTEMS As per history of present illness.   PAST MEDICAL HISTORY: Past Medical History:  Diagnosis Date   Amputation finger    left  ring finger   Anxiety    CAD (coronary artery disease)    LHC 10/12:  mild plaque in the LAD but no obstructive CAD   Chronic diastolic heart failure (HCC)    EF 55-60%, grade 1 diastolic dysfunction; no WMAs, echo, 11/2011; s/p pulmo. edema c/b VDRF 11/12; Echo 09/08/11: severe LVH, no SAM, no LVOT gradient, EF 55%, mild BAE, mild reduced RVSF.   CKD (chronic kidney disease)    Depression    Diabetes mellitus    Type II   GERD (gastroesophageal reflux disease)    History of kidney stones    History of right ACA stroke    10/12  --  MRI 09/13/11:  Acute right ACA infarct involving corpus callosum.  MRA 09/13/11: severe intracranial ASD, right ACA occluded,  left PCA and right MCA with severe disease.  Dopplers were neg for significant ICA stenosis   HLD (hyperlipidemia)    Hypertension    Renal insufficiency    Spermatocele    Thoracic aortic aneurysm (HCC)    CT in 10/12: 4.4 cm ascending thoracic aortic aneurysm    PAST SURGICAL HISTORY: Past Surgical History:  Procedure Laterality Date    vasectomy     bilateral   CARDIAC CATHETERIZATION  09/09/11   IR ANGIO EXTERNAL CAROTID SEL EXT CAROTID BILAT MOD SED  09/07/2022   IR ANGIO EXTERNAL CAROTID SEL EXT CAROTID UNI R MOD SED  06/03/2021   IR ANGIO INTRA EXTRACRAN SEL COM CAROTID INNOMINATE UNI L MOD SED  06/03/2021   IR ANGIO INTRA EXTRACRAN SEL INTERNAL CAROTID BILAT MOD SED  09/07/2022   IR ANGIO INTRA EXTRACRAN SEL INTERNAL CAROTID UNI R MOD SED  06/03/2021   IR ANGIO VERTEBRAL SEL VERTEBRAL BILAT MOD SED  06/03/2021   IR ANGIO VERTEBRAL SEL VERTEBRAL UNI R MOD SED  09/08/2022   IR ANGIOGRAM FOLLOW UP STUDY  09/28/2022   IR ANGIOGRAM FOLLOW UP STUDY  09/28/2022   IR ANGIOGRAM FOLLOW UP STUDY  09/28/2022   IR ANGIOGRAM FOLLOW UP STUDY  09/28/2022   IR ANGIOGRAM FOLLOW UP STUDY  09/28/2022   IR ANGIOGRAM FOLLOW UP STUDY  09/28/2022   IR ANGIOGRAM FOLLOW UP STUDY  09/28/2022   IR ANGIOGRAM FOLLOW UP STUDY  09/28/2022   IR ANGIOGRAM FOLLOW UP STUDY  09/28/2022   IR ANGIOGRAM FOLLOW UP STUDY  09/28/2022   IR CT HEAD LTD  09/28/2022   IR TRANSCATH/EMBOLIZ  09/28/2022   IR US  GUIDE VASC ACCESS RIGHT  06/03/2021   IR US  GUIDE VASC ACCESS RIGHT  09/07/2022   IR US  GUIDE VASC ACCESS RIGHT  09/28/2022   IR US  GUIDE VASC ACCESS RIGHT  09/28/2022   RADIOLOGY WITH ANESTHESIA N/A 09/28/2022   Procedure: Embolization of emissary vein;  Surgeon: de Macedo Rodrigues, Katyucia, MD;  Location: [facility] OR;  Service: Radiology;  Laterality: N/A;   ROTATOR CUFF REPAIR  07/2015   LEFT   SPERMATOCELECTOMY  1990's   RT side   SPERMATOCELECTOMY Left 07/20/2015   Procedure: LEFT HYDROCELECTOMY;  Surgeon: Homero Luster, MD;  Location: [facility] ORS;  Service: Urology;  Laterality: Left;   TRANSURETHRAL INCISION OF PROSTATE N/A 07/20/2015   Procedure: TRANSURETHRAL INCISION OF THE PROSTATE (TUIP);  Surgeon: Homero Luster, MD;  Location: [facility] ORS;  Service: Urology;  Laterality: N/A;    ALLERGIES: Allergies  Allergen Reactions   Gabapentin Nausea And Vomiting   Tomato Rash    FAMILY HISTORY:  Family History  Problem Relation Age of Onset  Heart attack Mother    Cerebral aneurysm Father    Cerebral aneurysm Other    Diabetes Other    Hypertension Other     SOCIAL HISTORY: Social History   Socioeconomic History   Marital status: Divorced    Spouse name: Not on file   Number of children: Not on file   Years of education: Not on file   Highest education level: Not on file  Occupational History   Not on file  Tobacco Use   Smoking status: Former    Current packs/day: 0.00    Average packs/day: 1 pack/day for 30.0 years (30.0 ttl pk-yrs)    Types: Cigarettes    Start date: 11/13/1964    Quit date: 11/13/1994    Years since quitting: 29.4   Smokeless tobacco: Never  Vaping Use   Vaping status: Never Used  Substance and Sexual Activity   Alcohol use: Yes    Comment: Occasional   Drug use: No   Sexual activity: Not Currently  Other Topics Concern   Not on file  Social History Narrative   Lives alone   Right Handed   Drinks 2-3 cups caffeine  daily   Social Drivers of Health   Financial Resource Strain: Low Risk  (09/27/2023)   Received from [facility]   Overall Financial Resource Strain (CARDIA)    Difficulty of Paying Living Expenses: Not hard at all  Food Insecurity: No Food Insecurity (09/27/2023)   Received from [facility]   Hunger Vital Sign    Worried About Running Out of Food in the Last Year: Never true    Ran Out of Food in the Last Year: Never true  Transportation Needs: No Transportation Needs (09/27/2023)   Received from [facility]   PRAPARE - Transportation     Lack of Transportation (Medical): No    Lack of Transportation (Non-Medical): No  Physical Activity: Sufficiently Active (09/27/2023)   Received from [facility]   Exercise Vital Sign    Days of Exercise per Week: 5 days    Minutes of Exercise per Session: 30 min  Stress: No Stress Concern Present (09/27/2023)   Received from [facility] of Occupational [facility] - Occupational Stress Questionnaire    Feeling of Stress : Not at all  Social Connections: Unknown (09/27/2023)   Received from [facility]   Social Connection and Isolation Panel [NHANES]    Frequency of Communication with Friends and Family: More than three times a week    Frequency of Social Gatherings with Friends and Family: Three times a week    Attends Religious Services: 1 to 4 times per year    Active Member of Clubs or Organizations: No    Attends Banker Meetings: 1 to 4 times per year    Marital Status: Not on file    MEDICATIONS:  Current Outpatient Medications  Medication Sig Dispense Refill   amLODipine  (NORVASC ) 10 MG tablet Take 1 tablet (10 mg total) by mouth every morning. Please keep scheduled appointment for future refills. Thank you. 90 tablet 0   aspirin  EC 81 MG tablet Take 1 tablet (81 mg total) by mouth daily. Swallow whole. 30 tablet 12   atorvastatin  (LIPITOR ) 80 MG tablet Take 80 mg by mouth at bedtime.     BD PEN NEEDLE NANO 2ND GEN 32G X 4 MM MISC USE WITH LANTUS  SOLOSTAR TWICE DAILY     BINAXNOW COVID-19 AG HOME TEST  KIT Use as Directed on the Package     busPIRone  (BUSPAR ) 5 MG tablet Take 5 mg by mouth 3 (three) times daily.     Carboxymethylcellul-Glycerin (CLEAR EYES FOR DRY EYES OP) Place 1 drop into both eyes daily as needed (dry eyes).     carvedilol  (COREG ) 25 MG tablet Take 1 tablet by mouth twice daily 180 tablet 0   clopidogrel  (PLAVIX ) 75 MG tablet Take 1 tablet (75 mg total) by mouth daily. 30 tablet 3   Continuous Glucose Sensor  (DEXCOM G7 SENSOR) MISC 1 Device by Does not apply route continuous. 9 each 3   diclofenac Sodium (VOLTAREN) 1 % GEL Apply 1 Application topically 4 (four) times daily as needed (pain).     diphenhydrAMINE HCl, Sleep, (ZZZQUIL) 50 MG/30ML LIQD Take 50 mg by mouth at bedtime as needed (sleep).     DULoxetine (CYMBALTA) 60 MG capsule Take 60 mg by mouth daily.     empagliflozin  (JARDIANCE ) 25 MG TABS tablet Take 1 tablet (25 mg total) by mouth daily. 90 tablet 3   famotidine (PEPCID) 40 MG tablet Take 40 mg by mouth at bedtime.     ferrous sulfate  325 (65 FE) MG tablet Take 325 mg by mouth 2 (two) times daily.     Garlic (GARLIQUE PO) Take 1 tablet by mouth at bedtime.     insulin  glargine (LANTUS ) 100 UNIT/ML Solostar Pen Inject 45 Units into the skin daily. 30 mL 3   isosorbide  mononitrate (IMDUR ) 30 MG 24 hr tablet Take 1 tablet by mouth once daily 90 tablet 2   lisinopril  (ZESTRIL ) 40 MG tablet Take 1 tablet by mouth once daily 90 tablet 0   loratadine  (CLARITIN ) 10 MG tablet Take 10 mg by mouth daily.     metFORMIN  (GLUCOPHAGE ) 1000 MG tablet Take 1 tablet (1,000 mg total) by mouth 2 (two) times daily. (Patient taking differently: Take 1,000 mg by mouth daily.) 180 tablet 3   montelukast (SINGULAIR) 10 MG tablet Take 10 mg by mouth at bedtime.     nitroGLYCERIN  (NITROSTAT ) 0.4 MG SL tablet Place 1 tablet (0.4 mg total) under the tongue every 5 (five) minutes as needed for chest pain. 25 tablet 3   Omega-3 Fatty Acids (FISH OIL) 1000 MG CAPS Take 1,000 mg by mouth at bedtime.     pantoprazole  (PROTONIX ) 40 MG tablet Take 40 mg by mouth at bedtime.     spironolactone  (ALDACTONE ) 25 MG tablet Take 1 tablet by mouth once daily 30 tablet 0   tirzepatide (MOUNJARO) 10 MG/0.5ML Pen Inject 10 mg into the skin once a week. 6 mL 4   No current facility-administered medications for this visit.   Facility-Administered Medications Ordered in Other Visits  Medication Dose Route Frequency Provider Last  Rate Last Admin   insulin  aspart (novoLOG ) injection 0-15 Units  0-15 Units Subcutaneous TID WC de Macedo Rodrigues, Katyucia, MD        PHYSICAL EXAM: Vitals:   04/11/24 0958  BP: 110/70  Pulse: 76  Resp: 16  SpO2: 96%  Weight: 215 lb 3.2 oz (97.6 kg)  Height: 5' 10.5" (1.791 m)   Body mass index is 30.44 kg/m.  Wt Readings from Last 3 Encounters:  04/11/24 215 lb 3.2 oz (97.6 kg)  12/11/23 225 lb (102.1 kg)  10/04/23 227 lb (103 kg)    General: Well developed, well nourished male in no apparent distress.  HEENT: AT/[place], no external lesions.  Eyes: Conjunctiva clear and no icterus. Neck:  Neck supple  Lungs: Respirations not labored Neurologic: Alert, oriented, normal speech Extremities / Skin: Dry. Psychiatric: Does not appear depressed or anxious  Diabetic Foot Exam - Simple   No data filed    LABS Reviewed Lab Results  Component Value Date   HGBA1C 7.3 (A) 04/11/2024   HGBA1C 10.4 (H) 11/26/2018   HGBA1C 8.9 (H) 08/26/2016   No results found for: "FRUCTOSAMINE" Lab Results  Component Value Date   CHOL 110 10/18/2022   HDL 36 (L) 10/18/2022   LDLCALC 40 10/18/2022   TRIG 172 (H) 10/18/2022   CHOLHDL 3.1 10/18/2022   Lab Results  Component Value Date   MICRALBCREAT 44 (H) 12/11/2023   Lab Results  Component Value Date   CREATININE 1.27 12/11/2023   No results found for: "GFR"  ASSESSMENT / PLAN  1. Type 2 diabetes mellitus with other specified complication, with long-term current use of insulin  (HCC)    Diabetes Mellitus type 2, complicated by diabetic neuropathy/nephropathy/microalbuminuria. - Diabetic status / severity: Uncontrolled.  Improving.  Lab Results  Component Value Date   HGBA1C 7.3 (A) 04/11/2024    - Hemoglobin A1c goal : <6.5%  Discussed about avoiding high carbohydrate meal especially with supper to avoid hyperglycemia.  - Medications: See below.  I) continue Lantus  45 units daily.  Repeat blood sugar in the early morning  less than 100 persistently asked to decrease to 40 units daily. II) continue Jardiance  25 mg daily. III stay on metformin  1000 mg 2 times a day.  He is currently taking once a day. V) increase Mounjaro from 7.5 to 10 mg weekly.    - Home glucose testing: Dexcom G7 and check as needed.  - Discussed/ Gave Hypoglycemia treatment plan.  # Consult : not required at this time.   # Annual urine for microalbuminuria/ creatinine ratio, + microalbuminuria currently, continue ACE/ARB /lisinopril .  Last  Lab Results  Component Value Date   MICRALBCREAT 44 (H) 12/11/2023    # Foot check nightly / neuropathy.  # Annual dilated diabetic eye exams.   - Diet: Make healthy diabetic food choices.  Encouraged to stay on the diet plan. - Life style / activity / exercise: Discussed.  2. Blood pressure  -  BP Readings from Last 1 Encounters:  04/11/24 110/70    - Control is in target.  - No change in current plans.  3. Lipid status / Hyperlipidemia - Last  Lab Results  Component Value Date   LDLCALC 40 10/18/2022   - Continue atorvastatin  80 mg daily.  Managed by PCP.  Diagnoses and all orders for this visit:  Type 2 diabetes mellitus with other specified complication, with long-term current use of insulin  (HCC) -     POCT glycosylated hemoglobin (Hb A1C) -     tirzepatide (MOUNJARO) 10 MG/0.5ML Pen; Inject 10 mg into the skin once a week.     DISPOSITION Follow up in clinic in 3 months suggested.   All questions answered and patient verbalized understanding of the plan.  Matthew Dejour Vos, MD [facility] [address] [place], [address] [place], [place] [address] Phone # [phone]  At least part of this note was generated using voice recognition software. Inadvertent word errors may have occurred, which were not recognized during the proofreading process.

## 2024-04-12 ENCOUNTER — Other Ambulatory Visit: Payer: Self-pay | Admitting: Cardiology

## 2024-04-14 ENCOUNTER — Other Ambulatory Visit: Payer: Self-pay | Admitting: Cardiology

## 2024-04-14 ENCOUNTER — Encounter: Payer: Self-pay | Admitting: Endocrinology

## 2024-04-16 ENCOUNTER — Other Ambulatory Visit: Payer: Self-pay | Admitting: Cardiology

## 2024-05-02 ENCOUNTER — Other Ambulatory Visit: Payer: Self-pay | Admitting: Cardiology

## 2024-05-09 ENCOUNTER — Other Ambulatory Visit: Payer: Self-pay | Admitting: Cardiology

## 2024-06-07 ENCOUNTER — Other Ambulatory Visit: Payer: Self-pay | Admitting: Cardiology

## 2024-06-27 ENCOUNTER — Other Ambulatory Visit: Payer: Self-pay | Admitting: Cardiology

## 2024-07-05 ENCOUNTER — Other Ambulatory Visit: Payer: Self-pay | Admitting: Cardiology

## 2024-07-08 NOTE — Progress Notes (Unsigned)
 Cardiology Office Note:   Date:  07/09/2024  ID:  Matthew Cain, DOB 1956-03-13, MRN [id number] PCP: Matthew Guy, DO  [facility] Providers Cardiologist:  Lynwood Schilling, MD {  History of Present Illness:   Matthew Cain is a 68 y.o. male male for evaluation of CAD.   In 2012 he was noted to have mild chest pain with mild CAD as seen on cardiac catheterization.  In 2020 he underwent a nuclear stress test that was negative for ischemia.  Echocardiogram last year revealed normal EF with severe septal hypertrophy, grade 1 DD, was slightly enlarged aortic root.  Cardiac MRI did not reveal septal hypertrophy.   Instead he was thought to have some diffuse hypertrophy probably related to hypertension.  There was no LV outflow structure and or late gadolinium enhancement.   Echo demonstrated a normal EF in December 2023.     He returns for follow-up.  He has done well since I last saw him except that he is bothered by neuropathy.  He is not able to exercise as much because of this.  He denies any chest pressure, neck or arm discomfort.  He has had no new shortness of breath, PND or orthopnea.  He denies any palpitations, presyncope or syncope.  ROS: As stated in the HPI and negative for all other systems.  Studies Reviewed:    EKG:   EKG Interpretation Date/Time:  Wednesday July 09 2024 14:53:31 EDT Ventricular Rate:  86 PR Interval:  234 QRS Duration:  122 QT Interval:  374 QTC Calculation: 447 R Axis:   114  Text Interpretation: Sinus rhythm with 1st degree A-V block Right axis deviation Left bundle branch block Septal infarct , age undetermined When compared with ECG of 13-Jun-2022 04:38, No significant change since last tracing Confirmed by Schilling Lynwood ([id number]) on 07/09/2024 3:18:15 PM     Risk Assessment/Calculations:             Physical Exam:   VS:  BP (!) 140/90   Pulse 86   Ht 5' 10.5 (1.791 m)   Wt 212 lb (96.2 kg)   BMI 29.99 kg/m    Wt Readings from Last 3  Encounters:  07/09/24 212 lb (96.2 kg)  04/11/24 215 lb 3.2 oz (97.6 kg)  12/11/23 225 lb (102.1 kg)     GEN: Well nourished, well developed in no acute distress NECK: No JVD; No carotid bruits CARDIAC: RRR, brief apical systolic murmur increasing slightly with the strain phase of Valsalva, no diastolic murmurs, rubs, gallops RESPIRATORY:  Clear to auscultation without rales, wheezing or rhonchi  ABDOMEN: Soft, non-tender, non-distended EXTREMITIES:  No edema; No deformity   ASSESSMENT AND PLAN:   CAD: He had a low risk perfusion study in 2022.  No change in therapy.SABRA   HLD: LDL was 40 but this was 2023.  I am going to check a lipid profile tomorrow.   Hx of asymmetric septal hypertrophy, history of diastolic dysfunction:   He has more global LVH and there were no high risk findings on MRI.  I will follow this clinically.    Chronic diastolic heart failure: He seems to be euvolemic.  No change in therapy.   Hypertension: The blood pressure is mildly elevated but he is going to keep an eye on this.  He says this is unusual.  No change in therapy.  Thoracic aortic aneurysm:   This was 42 mm.  He needs follow up I will schedule follow-up.  He will have  an aortic gram.      Follow up with me in one year.   Signed, Lynwood Schilling, MD

## 2024-07-09 ENCOUNTER — Other Ambulatory Visit: Payer: Self-pay | Admitting: Cardiology

## 2024-07-09 ENCOUNTER — Ambulatory Visit (INDEPENDENT_AMBULATORY_CARE_PROVIDER_SITE_OTHER): Admitting: Cardiology

## 2024-07-09 ENCOUNTER — Encounter: Payer: Self-pay | Admitting: Cardiology

## 2024-07-09 ENCOUNTER — Other Ambulatory Visit (HOSPITAL_COMMUNITY)

## 2024-07-09 VITALS — BP 140/90 | HR 86 | Ht 70.5 in | Wt 212.0 lb

## 2024-07-09 DIAGNOSIS — I712 Thoracic aortic aneurysm, without rupture, unspecified: Secondary | ICD-10-CM

## 2024-07-09 DIAGNOSIS — I25118 Atherosclerotic heart disease of native coronary artery with other forms of angina pectoris: Secondary | ICD-10-CM

## 2024-07-09 DIAGNOSIS — I5032 Chronic diastolic (congestive) heart failure: Secondary | ICD-10-CM | POA: Diagnosis not present

## 2024-07-09 DIAGNOSIS — Z01812 Encounter for preprocedural laboratory examination: Secondary | ICD-10-CM

## 2024-07-09 DIAGNOSIS — I10 Essential (primary) hypertension: Secondary | ICD-10-CM | POA: Diagnosis not present

## 2024-07-09 DIAGNOSIS — Z79899 Other long term (current) drug therapy: Secondary | ICD-10-CM

## 2024-07-09 DIAGNOSIS — E785 Hyperlipidemia, unspecified: Secondary | ICD-10-CM

## 2024-07-09 NOTE — Patient Instructions (Signed)
 Medication Instructions:   Continue all current medications.   Labwork:  BMET, FLP - orders given  Reminder:  Nothing to eat or drink after 12 midnight prior to labs.  Testing/Procedures:  CTA chest / aorta   Follow-Up:  Your physician wants you to follow up in:  1 year.  You should receive a recall letter in the mail about 2 months prior to the time you are due.  If you don't receive this, please call our office to schedule your follow up appointment.      Any Other Special Instructions Will Be Listed Below (If Applicable).   If you need a refill on your cardiac medications before your next appointment, please call your pharmacy.

## 2024-07-10 ENCOUNTER — Other Ambulatory Visit: Payer: Self-pay | Admitting: Cardiology

## 2024-07-11 ENCOUNTER — Encounter: Payer: Self-pay | Admitting: *Deleted

## 2024-07-16 ENCOUNTER — Ambulatory Visit: Payer: Self-pay | Admitting: Cardiology

## 2024-07-16 DIAGNOSIS — I712 Thoracic aortic aneurysm, without rupture, unspecified: Secondary | ICD-10-CM

## 2024-07-16 LAB — LIPID PANEL
Chol/HDL Ratio: 3 ratio (ref 0.0–5.0)
Cholesterol, Total: 89 mg/dL — ABNORMAL LOW (ref 100–199)
HDL: 30 mg/dL — ABNORMAL LOW (ref >39)
LDL Chol Calc (NIH): 26 mg/dL (ref 0–99)
Triglycerides: 208 mg/dL — ABNORMAL HIGH (ref 0–149)
VLDL Cholesterol Cal: 33 mg/dL (ref 5–40)

## 2024-07-16 LAB — BASIC METABOLIC PANEL WITH GFR
BUN/Creatinine Ratio: 14 (ref 10–24)
BUN: 16 mg/dL (ref 8–27)
CO2: 20 mmol/L (ref 20–29)
Calcium: 9 mg/dL (ref 8.6–10.2)
Chloride: 102 mmol/L (ref 96–106)
Creatinine, Ser: 1.18 mg/dL (ref 0.76–1.27)
Glucose: 173 mg/dL — ABNORMAL HIGH (ref 70–99)
Potassium: 4.2 mmol/L (ref 3.5–5.2)
Sodium: 138 mmol/L (ref 134–144)
eGFR: 67 mL/min/1.73 (ref >59)

## 2024-07-23 ENCOUNTER — Encounter: Payer: Self-pay | Admitting: Endocrinology

## 2024-07-23 ENCOUNTER — Ambulatory Visit: Payer: Self-pay | Admitting: Endocrinology

## 2024-07-23 ENCOUNTER — Ambulatory Visit (INDEPENDENT_AMBULATORY_CARE_PROVIDER_SITE_OTHER): Admitting: Endocrinology

## 2024-07-23 VITALS — BP 130/60 | HR 97 | Resp 20 | Ht 70.5 in | Wt 214.4 lb

## 2024-07-23 DIAGNOSIS — Z794 Long term (current) use of insulin: Secondary | ICD-10-CM

## 2024-07-23 DIAGNOSIS — E1169 Type 2 diabetes mellitus with other specified complication: Secondary | ICD-10-CM

## 2024-07-23 LAB — POCT GLYCOSYLATED HEMOGLOBIN (HGB A1C): Hemoglobin A1C: 6.7 % — AB (ref 4.0–5.6)

## 2024-07-23 MED ORDER — TIRZEPATIDE 12.5 MG/0.5ML ~~LOC~~ SOAJ: 12.5000 mg | SUBCUTANEOUS | 3 refills | Status: DC

## 2024-07-23 NOTE — Patient Instructions (Addendum)
 Latest Reference Range & Units 04/11/24 10:11 07/23/24 10:49  Hemoglobin A1C 4.0 - 5.6 % -  7.3 ! Pend 6.7 ! Pend  !: Data is abnormal  Lantus  45 units daily.  Jardiance  25mg  daily. Stay on Metformin  1000 mg two times a day.  Increase Moujaro 10 mg weekly to 12.5 mg weekly.

## 2024-07-23 NOTE — Progress Notes (Signed)
 Outpatient Endocrinology Note Matthew Roshon Duell, MD  07/23/24  Patient's Name: Matthew Cain    DOB: [birth year]    MRN: [id number]                                                    REASON OF VISIT: Follow-up of type 2 diabetes mellitus  REFERRING PROVIDER: Tobie Guy, DO  PCP: Tobie Guy, DO  HISTORY OF PRESENT ILLNESS:   Matthew Cain is a 68 y.o. old male with past medical history listed below, is here for follow-up for type 2 diabetes mellitus.   Pertinent Diabetes History: Patient was diagnosed with type 2 diabetes mellitus in his age of 67s.  Patient has uncontrolled type 2 diabetes mellitus, was initially seen in November 2024. Patient has been on insulin  therapy for several years.  He has hemoglobin A1c mostly in the range of 8%.  Used to be as high as 10 to 12% in the past. He had seen diabetic educator in the July 2024.  No history of diabetes ketoacidosis and hospitalization due to diabetes related condition.  No history of pancreatitis.  No family history of medullary thyroid  cancer, MEN 2 syndrome.  Chronic Diabetes Complications : Retinopathy: ? no. Last ophthalmology exam was done on annually, following with ophthalmology regularly.  Nephropathy: Yes, microalbuminuria, on ACE/ARB / lisinopril  Peripheral neuropathy: yes, tingling of toes.  Coronary artery disease: CAD Stroke: no  Relevant comorbidities and cardiovascular risk factors: Obesity: yes Body mass index is 30.33 kg/m.  Hypertension: Yes  Hyperlipidemia : Yes, on statin   Current / Home Diabetic regimen includes:  Lantus  45 units daily in the morning.  Jardiance  25 mg daily. Metformin  1000 mg daily.  Mounjaro  10 mg weekly.   Prior diabetic medications: On the higher dose of Lantus  up to 65 units 2 times a day in the past, gradually decreased after starting Mounjaro .  Glipizide  was stopped in January 2025.  Glycemic data:    CONTINUOUS GLUCOSE MONITORING SYSTEM (CGMS) INTERPRETATION:                          Dexcom G7 CGM-  Sensor Download (Sensor download was reviewed and summarized below.) Dates: August 28 to September 10 , 2025, 14 days   Glucose Management Indicator: 6.7%  Sensor usage: 96%    Previous:    Impression: Mostly acceptable blood sugar.  Occasional mild hyperglycemia with blood sugar in 200s postprandially.  Blood sugar overnight and in between the meals are acceptable.  No hypoglycemia. Improvement on blood sugar compared to last visit.  Hypoglycemia: Patient has no hypoglycemic episodes. Patient has hypoglycemia awareness.  Factors modifying glucose control: 1.  Diabetic diet assessment: 2-3 meals a day. Occasional candy and soda.  Decreased appetite after being on Mounjaro .  2.  Staying active or exercising: no formal exercise.   3.  Medication compliance: compliant all of the time.  Interval history  CGM data as reviewed above.  Mostly acceptable blood sugar.  Diabetes control improving hemoglobin A1c 6.7%.  Patient reports in last 1-1/2 weeks he is taking Lantus  45 units ? 2 times a day however prior to that he was taking once a day.  Diabetes regimen as reviewed above.  He has been tolerating Mounjaro  well, denies any GI issues.  No other complaints today.  REVIEW  OF SYSTEMS As per history of present illness.   PAST MEDICAL HISTORY: Past Medical History:  Diagnosis Date   Amputation finger    left ring finger   Anxiety    CAD (coronary artery disease)    LHC 10/12:  mild plaque in the LAD but no obstructive CAD   Chronic diastolic heart failure (HCC)    EF 55-60%, grade 1 diastolic dysfunction; no WMAs, echo, 11/2011; s/p pulmo. edema c/b VDRF 11/12; Echo 09/08/11: severe LVH, no SAM, no LVOT gradient, EF 55%, mild BAE, mild reduced RVSF.   CKD (chronic kidney disease)    Depression    Diabetes mellitus    Type II   GERD (gastroesophageal reflux disease)    History of kidney stones    History of right ACA stroke    10/12  --  MRI 09/13/11:   Acute right ACA infarct involving corpus callosum.  MRA 09/13/11: severe intracranial ASD, right ACA occluded, left PCA and right MCA with severe disease.  Dopplers were neg for significant ICA stenosis   HLD (hyperlipidemia)    Hypertension    Renal insufficiency    Spermatocele    Thoracic aortic aneurysm (HCC)    CT in 10/12: 4.4 cm ascending thoracic aortic aneurysm    PAST SURGICAL HISTORY: Past Surgical History:  Procedure Laterality Date    vasectomy     bilateral   CARDIAC CATHETERIZATION  09/09/11   IR ANGIO EXTERNAL CAROTID SEL EXT CAROTID BILAT MOD SED  09/07/2022   IR ANGIO EXTERNAL CAROTID SEL EXT CAROTID UNI R MOD SED  06/03/2021   IR ANGIO INTRA EXTRACRAN SEL COM CAROTID INNOMINATE UNI L MOD SED  06/03/2021   IR ANGIO INTRA EXTRACRAN SEL INTERNAL CAROTID BILAT MOD SED  09/07/2022   IR ANGIO INTRA EXTRACRAN SEL INTERNAL CAROTID UNI R MOD SED  06/03/2021   IR ANGIO VERTEBRAL SEL VERTEBRAL BILAT MOD SED  06/03/2021   IR ANGIO VERTEBRAL SEL VERTEBRAL UNI R MOD SED  09/08/2022   IR ANGIOGRAM FOLLOW UP STUDY  09/28/2022   IR ANGIOGRAM FOLLOW UP STUDY  09/28/2022   IR ANGIOGRAM FOLLOW UP STUDY  09/28/2022   IR ANGIOGRAM FOLLOW UP STUDY  09/28/2022   IR ANGIOGRAM FOLLOW UP STUDY  09/28/2022   IR ANGIOGRAM FOLLOW UP STUDY  09/28/2022   IR ANGIOGRAM FOLLOW UP STUDY  09/28/2022   IR ANGIOGRAM FOLLOW UP STUDY  09/28/2022   IR ANGIOGRAM FOLLOW UP STUDY  09/28/2022   IR ANGIOGRAM FOLLOW UP STUDY  09/28/2022   IR CT HEAD LTD  09/28/2022   IR TRANSCATH/EMBOLIZ  09/28/2022   IR US  GUIDE VASC ACCESS RIGHT  06/03/2021   IR US  GUIDE VASC ACCESS RIGHT  09/07/2022   IR US  GUIDE VASC ACCESS RIGHT  09/28/2022   IR US  GUIDE VASC ACCESS RIGHT  09/28/2022   RADIOLOGY WITH ANESTHESIA N/A 09/28/2022   Procedure: Embolization of emissary vein;  Surgeon: de Macedo Rodrigues, Katyucia, MD;  Location: [facility] OR;  Service: Radiology;  Laterality: N/A;   ROTATOR CUFF REPAIR  07/2015   LEFT    SPERMATOCELECTOMY  1990's   RT side   SPERMATOCELECTOMY Left 07/20/2015   Procedure: LEFT HYDROCELECTOMY;  Surgeon: Norleen Seltzer, MD;  Location: [facility] ORS;  Service: Urology;  Laterality: Left;   TRANSURETHRAL INCISION OF PROSTATE N/A 07/20/2015   Procedure: TRANSURETHRAL INCISION OF THE PROSTATE (TUIP);  Surgeon: Norleen Seltzer, MD;  Location: [facility] OR[facility];  Service: Urology;  Laterality: N/A;    ALLERGIES: Allergies  Allergen Reactions   Gabapentin Nausea And Vomiting   Tomato Rash    FAMILY HISTORY:  Family History  Problem Relation Age of Onset   Heart attack Mother    Cerebral aneurysm Father    Cerebral aneurysm Other    Diabetes Other    Hypertension Other     SOCIAL HISTORY: Social History   Socioeconomic History   Marital status: Divorced    Spouse name: Not on file   Number of children: Not on file   Years of education: Not on file   Highest education level: Not on file  Occupational History   Not on file  Tobacco Use   Smoking status: Former    Current packs/day: 0.00    Average packs/day: 1 pack/day for 30.0 years (30.0 ttl pk-yrs)    Types: Cigarettes    Start date: 11/13/1964    Quit date: 11/13/1994    Years since quitting: 29.7   Smokeless tobacco: Never  Vaping Use   Vaping status: Never Used  Substance and Sexual Activity   Alcohol use: Yes    Comment: Occasional   Drug use: No   Sexual activity: Not Currently  Other Topics Concern   Not on file  Social History Narrative   Lives alone   Right Handed   Drinks 2-3 cups caffeine  daily   Social Drivers of Health   Financial Resource Strain: Low Risk  (09/27/2023)   Received from [facility]   Overall Financial Resource Strain (CARDIA)    Difficulty of Paying Living Expenses: Not hard at all  Food Insecurity: No Food Insecurity (09/27/2023)   Received from [facility]   Hunger Vital Sign    Within the past 12 months, you worried that your food would run out before you got the money to buy more.: Never  true    Within the past 12 months, the food you bought just didn't last and you didn't have money to get more.: Never true  Transportation Needs: No Transportation Needs (09/27/2023)   Received from [facility]   PRAPARE - Transportation    Lack of Transportation (Medical): No    Lack of Transportation (Non-Medical): No  Physical Activity: Sufficiently Active (09/27/2023)   Received from [facility]   Exercise Vital Sign    On average, how many days per week do you engage in moderate to strenuous exercise (like a brisk walk)?: 5 days    On average, how many minutes do you engage in exercise at this level?: 30 min  Stress: No Stress Concern Present (09/27/2023)   Received from [facility] - Occupational Stress Questionnaire    Feeling of Stress : Not at all  Social Connections: Unknown (09/27/2023)   Received from [facility]   Social Connection and Isolation Panel    In a typical week, how many times do you talk on the phone with family, friends, or neighbors?: More than three times a week    How often do you get together with friends or relatives?: Three times a week    How often do you attend church or religious services?: 1 to 4 times per year    Do you belong to any clubs or organizations such as church groups, unions, fraternal or athletic groups, or school groups?: No    How often do you attend meetings of the clubs or organizations you belong to?: 1 to 4 times per year  Marital Status: Not on file    MEDICATIONS:  Current Outpatient Medications  Medication Sig Dispense Refill   amLODipine  (NORVASC ) 10 MG tablet TAKE 1 TABLET BY MOUTH ONCE DAILY IN THE MORNING 90 tablet 3   aspirin  EC 81 MG tablet Take 1 tablet (81 mg total) by mouth daily. Swallow whole. 30 tablet 12   atorvastatin  (LIPITOR ) 80 MG tablet Take 80 mg by mouth at bedtime.     BD PEN NEEDLE NANO 2ND GEN 32G X 4 MM MISC USE WITH LANTUS  SOLOSTAR TWICE  DAILY     BINAXNOW COVID-19 AG HOME TEST KIT Use as Directed on the Package     busPIRone  (BUSPAR ) 5 MG tablet Take 5 mg by mouth 3 (three) times daily.     Carboxymethylcellul-Glycerin (CLEAR EYES FOR DRY EYES OP) Place 1 drop into both eyes daily as needed (dry eyes).     carvedilol  (COREG ) 25 MG tablet Take 1 tablet by mouth twice daily 180 tablet 0   clopidogrel  (PLAVIX ) 75 MG tablet Take 1 tablet (75 mg total) by mouth daily. 30 tablet 3   Continuous Glucose Sensor (DEXCOM G7 SENSOR) MISC 1 Device by Does not apply route continuous. 9 each 3   diclofenac Sodium (VOLTAREN) 1 % GEL Apply 1 Application topically 4 (four) times daily as needed (pain).     diphenhydrAMINE HCl, Sleep, (ZZZQUIL) 50 MG/30ML LIQD Take 50 mg by mouth at bedtime as needed (sleep).     DULoxetine (CYMBALTA) 60 MG capsule Take 60 mg by mouth daily.     empagliflozin  (JARDIANCE ) 25 MG TABS tablet Take 1 tablet (25 mg total) by mouth daily. 90 tablet 3   famotidine (PEPCID) 40 MG tablet Take 40 mg by mouth at bedtime.     ferrous sulfate  325 (65 FE) MG tablet Take 325 mg by mouth 2 (two) times daily.     Garlic (GARLIQUE PO) Take 1 tablet by mouth at bedtime.     insulin  glargine (LANTUS ) 100 UNIT/ML Solostar Pen Inject 45 Units into the skin daily. 30 mL 3   isosorbide  mononitrate (IMDUR ) 30 MG 24 hr tablet Take 1 tablet (30 mg total) by mouth daily. 90 tablet 3   lisinopril  (ZESTRIL ) 40 MG tablet TAKE 1 TABLET BY MOUTH ONCE DAILY *MUST  KEEP  APRIL  APPOINTMENT  FOR  REFILLS* 30 tablet 1   loratadine  (CLARITIN ) 10 MG tablet Take 10 mg by mouth daily.     metFORMIN  (GLUCOPHAGE ) 1000 MG tablet Take 1 tablet (1,000 mg total) by mouth 2 (two) times daily. (Patient taking differently: Take 1,000 mg by mouth daily.) 180 tablet 3   montelukast (SINGULAIR) 10 MG tablet Take 10 mg by mouth at bedtime.     nitroGLYCERIN  (NITROSTAT ) 0.4 MG SL tablet Place 1 tablet (0.4 mg total) under the tongue every 5 (five) minutes as needed for  chest pain. 25 tablet 3   Omega-3 Fatty Acids (FISH OIL) 1000 MG CAPS Take 1,000 mg by mouth at bedtime.     pantoprazole  (PROTONIX ) 40 MG tablet Take 40 mg by mouth at bedtime.     spironolactone  (ALDACTONE ) 25 MG tablet Take 1 tablet (25 mg total) by mouth daily. 30 tablet 1   tirzepatide  (MOUNJARO ) 12.5 MG/0.5ML Pen Inject 12.5 mg into the skin once a week. 6 mL 3   No current facility-administered medications for this visit.   Facility-Administered Medications Ordered in Other Visits  Medication Dose Route Frequency Provider Last Rate Last Admin   insulin   aspart (novoLOG ) injection 0-15 Units  0-15 Units Subcutaneous TID WC de Nile Erichsen, Katyucia, MD        PHYSICAL EXAM: Vitals:   07/23/24 1046  BP: 130/60  Pulse: 97  Resp: 20  SpO2: 98%  Weight: 214 lb 6.4 oz (97.3 kg)  Height: 5' 10.5 (1.791 m)    Body mass index is 30.33 kg/m.  Wt Readings from Last 3 Encounters:  07/23/24 214 lb 6.4 oz (97.3 kg)  07/09/24 212 lb (96.2 kg)  04/11/24 215 lb 3.2 oz (97.6 kg)    General: Well developed, well nourished male in no apparent distress.  HEENT: AT/[place], no external lesions.  Eyes: Conjunctiva clear and no icterus. Neck: Neck supple  Lungs: Respirations not labored Neurologic: Alert, oriented, normal speech Extremities / Skin: Dry. Psychiatric: Does not appear depressed or anxious  Diabetic Foot Exam - Simple   No data filed    LABS Reviewed Lab Results  Component Value Date   HGBA1C 6.7 (A) 07/23/2024   HGBA1C 7.3 (A) 04/11/2024   HGBA1C 10.4 (H) 11/26/2018   No results found for: FRUCTOSAMINE Lab Results  Component Value Date   CHOL 89 (L) 07/15/2024   HDL 30 (L) 07/15/2024   LDLCALC 26 07/15/2024   TRIG 208 (H) 07/15/2024   CHOLHDL 3.0 07/15/2024   Lab Results  Component Value Date   MICRALBCREAT 44 (H) 12/11/2023   Lab Results  Component Value Date   CREATININE 1.18 07/15/2024   No results found for: GFR  ASSESSMENT / PLAN  1. Type 2  diabetes mellitus with other specified complication, with long-term current use of insulin  (HCC)     Diabetes Mellitus type 2, complicated by diabetic neuropathy/nephropathy/microalbuminuria. - Diabetic status / severity: Uncontrolled.  Improving.  Lab Results  Component Value Date   HGBA1C 6.7 (A) 07/23/2024    - Hemoglobin A1c goal : <6.5%  Diabetes control improving.  - Medications: See below.  I) continue Lantus  45 units daily.  Stay on once a day only.  Repeat blood sugar in the early morning less than 100 persistently asked to decrease to 40 units daily. II) continue Jardiance  25 mg daily. III stay on metformin  1000 mg 2 times a day.   V) increase Mounjaro  from 10 to 12.5 mg weekly.    - Home glucose testing: Dexcom G7 and check as needed.  - Discussed/ Gave Hypoglycemia treatment plan.  # Consult : not required at this time.   # Annual urine for microalbuminuria/ creatinine ratio, + microalbuminuria currently, continue ACE/ARB /lisinopril  /Jardiance .  Last  Lab Results  Component Value Date   MICRALBCREAT 44 (H) 12/11/2023    # Foot check nightly / neuropathy.  # Annual dilated diabetic eye exams.   - Diet: Make healthy diabetic food choices.  Encouraged to stay on the diet plan. - Life style / activity / exercise: Discussed.  2. Blood pressure  -  BP Readings from Last 1 Encounters:  07/23/24 130/60    - Control is in target.  - No change in current plans.  3. Lipid status / Hyperlipidemia - Last  Lab Results  Component Value Date   LDLCALC 26 07/15/2024   - Continue atorvastatin  80 mg daily.  Managed by PCP.  Diagnoses and all orders for this visit:  Type 2 diabetes mellitus with other specified complication, with long-term current use of insulin  (HCC) -     POCT glycosylated hemoglobin (Hb A1C) -     tirzepatide  (MOUNJARO ) 12.5 MG/0.5ML Pen;  Inject 12.5 mg into the skin once a week.    DISPOSITION Follow up in clinic in 3 months  suggested.   All questions answered and patient verbalized understanding of the plan.  Matthew Deckard Stuber, MD [facility] Endocrinology [facility] [address] [place], [address] [place], [place] [address] Phone # [phone]  At least part of this note was generated using voice recognition software. Inadvertent word errors may have occurred, which were not recognized during the proofreading process.

## 2024-07-25 ENCOUNTER — Other Ambulatory Visit (HOSPITAL_COMMUNITY)

## 2024-07-28 ENCOUNTER — Ambulatory Visit (HOSPITAL_COMMUNITY)
Admission: RE | Admit: 2024-07-28 | Discharge: 2024-07-28 | Disposition: A | Discharge status: Home / Self Care | Source: Ambulatory Visit | Attending: Cardiology | Admitting: Cardiology

## 2024-07-28 DIAGNOSIS — I712 Thoracic aortic aneurysm, without rupture, unspecified: Secondary | ICD-10-CM | POA: Diagnosis present

## 2024-07-28 MED ORDER — IOHEXOL 350 MG/ML SOLN
75.0000 mL | Freq: Once | INTRAVENOUS | Status: AC | PRN
  Administered 2024-07-28: 75 mL via INTRAVENOUS

## 2024-07-30 ENCOUNTER — Encounter (HOSPITAL_COMMUNITY)
Admission: RE | Admit: 2024-07-30 | Discharge: 2024-07-30 | Disposition: A | Discharge status: Home / Self Care | Source: Ambulatory Visit | Attending: Ophthalmology | Admitting: Ophthalmology

## 2024-07-30 NOTE — H&P (Signed)
 Surgical History & Physical  Patient Name: Matthew Cain  DOB: June 06, 1956  Surgery: Cataract extraction with intraocular lens implant phacoemulsification; Left Eye Surgeon: Lynwood Hermann MD Surgery Date: 08/01/2024 Pre-Op Date: 05/22/2024  HPI: A 61 Yr. old male patient present for cataract eval per Dr. Nicholaus. 1. The patient complains of difficulty when driving during the day and at night due to glare, watching TV, and reading, which began 6 months ago. Both eyes are affected, OS>OD. The episode is gradual. The condition's severity is worsening. This is negatively affecting the patient's quality of life and the patient is unable to function adequately in life with the current level of vision. IDDM x10-12 years. A1C 7.0 BS 210 in office this morning HPI Completed by Dr. Lynwood Hermann  Medical History: Cataracts  Arthritis Diabetes High Blood Pressure LDL Stroke Allergies  Review of Systems Allergic/Immunologic Seasonal Allergies Cardiovascular High Blood Pressure Endocrine diabetes Musculoskeletal arthritis pains Neurological Stroke All recorded systems are negative except as noted above.  Social Former smoker   Medication Prednisolone-moxiflox-bromfen,  Carvedilol , Amlodipine , Atorvastatin , Metformin , Buspirone , Montelukast, Lisinopril , Ipratropium bromide , Isosorbide  mononitrate, Famotidine, Jardiance , Duloxetine, Spironolactone , Mounjaro , Pantoprazole , Mounjaro , Dexcom G7 Sensor  Sx/Procedures Shoulder surgery - lt, Vasectomy, Artery cleaning  Drug Allergies  Tomatoes, Gabapentin  History & Physical: Heent: cataracts NECK: supple without bruits LUNGS: lungs clear to auscultation CV: regular rate and rhythm Abdomen: soft and non-tender  Impression & Plan: Assessment: 1.  COMBINED FORMS AGE RELATED CATARACT; Both Eyes (H25.813) 2.  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  BLEPHARITIS; Right Upper Lid, Right Lower Lid, Left Upper Lid, Left  Lower Lid (H01.001, H01.002,H01.004,H01.005) 4.  DERMATOCHALASIS, no surgery; Right Upper Lid, Left Upper Lid (H02.831, H02.834) 5.  Pinguecula; Both Eyes (H11.153)  Plan: 1.  Cataract accounts for the patient's decreased vision. This visual impairment is not correctable with a tolerable change in glasses or contact lenses. Cataract surgery with an implantation of a new lens should significantly improve the visual and functional status of the patient.Discussed all risks, benefits, alternatives, and potential complications. Discussed the procedures and recovery. Patient desires to have surgery. A-scan ordered and performed today for intra-ocular lens calculations. The surgery will be performed in order to improve vision for driving, reading, and for eye examinations. Recommend phacoemulsification with intra-ocular lens. Recommend Dextenza  for post-operative pain and inflammation. History of refractive Surgery: Use of Eye Pressure Lowering Drops: Left Eye worse. OS first, then OD. Standard Lens only Dilates well - shugarcaine or Lidocaine +Omidira by protocol  2.  Large C/D ratio OU. LPI OU. OS very small. rNFL OCT: borderline WNL 05/22/24  3.  Blepharitis is present - recommend regular lid cleaning.  4.  Asymptomatic, recommend observation for now. Findings, prognosis and treatment options reviewed.  5.  Observe; Artificial tears as needed for irritation.

## 2024-07-30 NOTE — H&P (View-Only) (Signed)
 Surgical History & Physical  Patient Name: Matthew Cain  DOB: June 06, 1956  Surgery: Cataract extraction with intraocular lens implant phacoemulsification; Left Eye Surgeon: Lynwood Hermann MD Surgery Date: 08/01/2024 Pre-Op Date: 05/22/2024  HPI: A 61 Yr. old male patient present for cataract eval per Dr. Nicholaus. 1. The patient complains of difficulty when driving during the day and at night due to glare, watching TV, and reading, which began 6 months ago. Both eyes are affected, OS>OD. The episode is gradual. The condition's severity is worsening. This is negatively affecting the patient's quality of life and the patient is unable to function adequately in life with the current level of vision. IDDM x10-12 years. A1C 7.0 BS 210 in office this morning HPI Completed by Dr. Lynwood Hermann  Medical History: Cataracts  Arthritis Diabetes High Blood Pressure LDL Stroke Allergies  Review of Systems Allergic/Immunologic Seasonal Allergies Cardiovascular High Blood Pressure Endocrine diabetes Musculoskeletal arthritis pains Neurological Stroke All recorded systems are negative except as noted above.  Social Former smoker   Medication Prednisolone-moxiflox-bromfen,  Carvedilol , Amlodipine , Atorvastatin , Metformin , Buspirone , Montelukast, Lisinopril , Ipratropium bromide , Isosorbide  mononitrate, Famotidine, Jardiance , Duloxetine, Spironolactone , Mounjaro , Pantoprazole , Mounjaro , Dexcom G7 Sensor  Sx/Procedures Shoulder surgery - lt, Vasectomy, Artery cleaning  Drug Allergies  Tomatoes, Gabapentin  History & Physical: Heent: cataracts NECK: supple without bruits LUNGS: lungs clear to auscultation CV: regular rate and rhythm Abdomen: soft and non-tender  Impression & Plan: Assessment: 1.  COMBINED FORMS AGE RELATED CATARACT; Both Eyes (H25.813) 2.  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  BLEPHARITIS; Right Upper Lid, Right Lower Lid, Left Upper Lid, Left  Lower Lid (H01.001, H01.002,H01.004,H01.005) 4.  DERMATOCHALASIS, no surgery; Right Upper Lid, Left Upper Lid (H02.831, H02.834) 5.  Pinguecula; Both Eyes (H11.153)  Plan: 1.  Cataract accounts for the patient's decreased vision. This visual impairment is not correctable with a tolerable change in glasses or contact lenses. Cataract surgery with an implantation of a new lens should significantly improve the visual and functional status of the patient.Discussed all risks, benefits, alternatives, and potential complications. Discussed the procedures and recovery. Patient desires to have surgery. A-scan ordered and performed today for intra-ocular lens calculations. The surgery will be performed in order to improve vision for driving, reading, and for eye examinations. Recommend phacoemulsification with intra-ocular lens. Recommend Dextenza  for post-operative pain and inflammation. History of refractive Surgery: Use of Eye Pressure Lowering Drops: Left Eye worse. OS first, then OD. Standard Lens only Dilates well - shugarcaine or Lidocaine +Omidira by protocol  2.  Large C/D ratio OU. LPI OU. OS very small. rNFL OCT: borderline WNL 05/22/24  3.  Blepharitis is present - recommend regular lid cleaning.  4.  Asymptomatic, recommend observation for now. Findings, prognosis and treatment options reviewed.  5.  Observe; Artificial tears as needed for irritation.

## 2024-07-31 ENCOUNTER — Other Ambulatory Visit: Payer: Self-pay | Admitting: Cardiology

## 2024-08-01 ENCOUNTER — Encounter (HOSPITAL_COMMUNITY)
Admission: RE | Disposition: A | Discharge status: Home / Self Care | Payer: Self-pay | Source: Home / Self Care | Attending: Ophthalmology

## 2024-08-01 ENCOUNTER — Ambulatory Visit (HOSPITAL_COMMUNITY): Admitting: Anesthesiology

## 2024-08-01 ENCOUNTER — Encounter (HOSPITAL_COMMUNITY): Payer: Self-pay | Admitting: Ophthalmology

## 2024-08-01 ENCOUNTER — Ambulatory Visit (HOSPITAL_COMMUNITY)
Admission: RE | Admit: 2024-08-01 | Discharge: 2024-08-01 | Disposition: A | Discharge status: Home / Self Care | Attending: Ophthalmology | Admitting: Ophthalmology

## 2024-08-01 ENCOUNTER — Inpatient Hospital Stay (HOSPITAL_COMMUNITY): Admit: 2024-08-01 | Discharge: 2024-08-01 | Disposition: A | Discharge status: Home / Self Care

## 2024-08-01 DIAGNOSIS — H25813 Combined forms of age-related cataract, bilateral: Secondary | ICD-10-CM | POA: Diagnosis not present

## 2024-08-01 DIAGNOSIS — H02831 Dermatochalasis of right upper eyelid: Secondary | ICD-10-CM | POA: Diagnosis not present

## 2024-08-01 DIAGNOSIS — H0100B Unspecified blepharitis left eye, upper and lower eyelids: Secondary | ICD-10-CM | POA: Diagnosis not present

## 2024-08-01 DIAGNOSIS — H02834 Dermatochalasis of left upper eyelid: Secondary | ICD-10-CM | POA: Diagnosis not present

## 2024-08-01 DIAGNOSIS — Z5309 Procedure and treatment not carried out because of other contraindication: Secondary | ICD-10-CM | POA: Insufficient documentation

## 2024-08-01 DIAGNOSIS — H402234 Chronic angle-closure glaucoma, bilateral, indeterminate stage: Secondary | ICD-10-CM | POA: Insufficient documentation

## 2024-08-01 DIAGNOSIS — H11153 Pinguecula, bilateral: Secondary | ICD-10-CM | POA: Insufficient documentation

## 2024-08-01 DIAGNOSIS — H0100A Unspecified blepharitis right eye, upper and lower eyelids: Secondary | ICD-10-CM | POA: Insufficient documentation

## 2024-08-01 DIAGNOSIS — E1136 Type 2 diabetes mellitus with diabetic cataract: Secondary | ICD-10-CM | POA: Diagnosis present

## 2024-08-01 LAB — GLUCOSE, CAPILLARY: Glucose-Capillary: 105 mg/dL — ABNORMAL HIGH (ref 70–99)

## 2024-08-01 SURGERY — PHACOEMULSIFICATION, CATARACT, WITH IOL INSERTION
Anesthesia: Monitor Anesthesia Care | Site: Eye | Laterality: Left

## 2024-08-01 SURGICAL SUPPLY — 8 items
CLOTH BEACON ORANGE TIMEOUT ST (SAFETY) ×1 IMPLANT
FEE CATARACT SUITE SIGHTPATH (MISCELLANEOUS) ×1 IMPLANT
GLOVE BIOGEL PI IND STRL 7.0 (GLOVE) ×2 IMPLANT
NEEDLE HYPO 18GX1.5 BLUNT FILL (NEEDLE) ×1 IMPLANT
PAD ARMBOARD POSITIONER FOAM (MISCELLANEOUS) ×1 IMPLANT
RING MALYGIN 7.0 (MISCELLANEOUS) IMPLANT
SYR TB 1ML LL NO SAFETY (SYRINGE) ×1 IMPLANT
WATER STERILE IRR 250ML POUR (IV SOLUTION) ×1 IMPLANT

## 2024-08-01 NOTE — Progress Notes (Signed)
 Patient states I ate a banana at 7:15 am this morning. Dr.Kiel notified and he states he will need to be rescheduled. Pt notified and OR notified.

## 2024-08-04 ENCOUNTER — Encounter (HOSPITAL_COMMUNITY)
Admission: RE | Disposition: A | Discharge status: Home / Self Care | Payer: Self-pay | Source: Home / Self Care | Attending: Ophthalmology

## 2024-08-04 ENCOUNTER — Ambulatory Visit (HOSPITAL_COMMUNITY)
Admission: RE | Admit: 2024-08-04 | Discharge: 2024-08-04 | Disposition: A | Discharge status: Home / Self Care | Attending: Ophthalmology | Admitting: Ophthalmology

## 2024-08-04 ENCOUNTER — Encounter (HOSPITAL_COMMUNITY): Payer: Self-pay | Admitting: Ophthalmology

## 2024-08-04 ENCOUNTER — Ambulatory Visit (HOSPITAL_BASED_OUTPATIENT_CLINIC_OR_DEPARTMENT_OTHER): Admitting: Anesthesiology

## 2024-08-04 ENCOUNTER — Ambulatory Visit (HOSPITAL_COMMUNITY): Admitting: Anesthesiology

## 2024-08-04 DIAGNOSIS — I5032 Chronic diastolic (congestive) heart failure: Secondary | ICD-10-CM | POA: Insufficient documentation

## 2024-08-04 DIAGNOSIS — N189 Chronic kidney disease, unspecified: Secondary | ICD-10-CM | POA: Diagnosis not present

## 2024-08-04 DIAGNOSIS — K219 Gastro-esophageal reflux disease without esophagitis: Secondary | ICD-10-CM | POA: Diagnosis not present

## 2024-08-04 DIAGNOSIS — I2510 Atherosclerotic heart disease of native coronary artery without angina pectoris: Secondary | ICD-10-CM | POA: Diagnosis not present

## 2024-08-04 DIAGNOSIS — Z87891 Personal history of nicotine dependence: Secondary | ICD-10-CM

## 2024-08-04 DIAGNOSIS — E1122 Type 2 diabetes mellitus with diabetic chronic kidney disease: Secondary | ICD-10-CM | POA: Insufficient documentation

## 2024-08-04 DIAGNOSIS — I252 Old myocardial infarction: Secondary | ICD-10-CM

## 2024-08-04 DIAGNOSIS — E1136 Type 2 diabetes mellitus with diabetic cataract: Secondary | ICD-10-CM | POA: Insufficient documentation

## 2024-08-04 DIAGNOSIS — H25812 Combined forms of age-related cataract, left eye: Secondary | ICD-10-CM | POA: Diagnosis present

## 2024-08-04 DIAGNOSIS — I130 Hypertensive heart and chronic kidney disease with heart failure and stage 1 through stage 4 chronic kidney disease, or unspecified chronic kidney disease: Secondary | ICD-10-CM | POA: Diagnosis not present

## 2024-08-04 HISTORY — PX: CATARACT EXTRACTION W/PHACO: SHX586

## 2024-08-04 LAB — GLUCOSE, CAPILLARY: Glucose-Capillary: 119 mg/dL — ABNORMAL HIGH (ref 70–99)

## 2024-08-04 SURGERY — PHACOEMULSIFICATION, CATARACT, WITH IOL INSERTION
Anesthesia: Monitor Anesthesia Care | Site: Eye | Laterality: Left

## 2024-08-04 MED ORDER — SODIUM HYALURONATE 10 MG/ML IO SOLUTION: PREFILLED_SYRINGE | INTRAOCULAR | Status: DC | PRN

## 2024-08-04 MED ORDER — POVIDONE-IODINE 5 % OP SOLN
OPHTHALMIC | Status: DC | PRN
  Administered 2024-08-04: 1 via OPHTHALMIC

## 2024-08-04 MED ORDER — TETRACAINE HCL 0.5 % OP SOLN
1.0000 [drp] | OPHTHALMIC | Status: AC | PRN
  Administered 2024-08-04 (×3): 1 [drp] via OPHTHALMIC

## 2024-08-04 MED ORDER — MOXIFLOXACIN HCL 5 MG/ML IO SOLN
INTRAOCULAR | Status: DC | PRN
  Administered 2024-08-04: .2 mL via OPHTHALMIC

## 2024-08-04 MED ORDER — PHENYLEPHRINE-KETOROLAC 1-0.3 % IO SOLN
INTRAOCULAR | Status: DC | PRN
  Administered 2024-08-04: 500 mL via OPHTHALMIC

## 2024-08-04 MED ORDER — LIDOCAINE HCL (PF) 1 % IJ SOLN
INTRAMUSCULAR | Status: DC | PRN
  Administered 2024-08-04: 2 mL

## 2024-08-04 MED ORDER — LIDOCAINE HCL 3.5 % OP GEL: 1.0000 | Freq: Once | OPHTHALMIC | Status: DC

## 2024-08-04 MED ORDER — MIDAZOLAM HCL 2 MG/2ML IJ SOLN
INTRAMUSCULAR | Status: AC
  Filled 2024-08-04: qty 2

## 2024-08-04 MED ORDER — MIDAZOLAM HCL 2 MG/2ML IJ SOLN
INTRAMUSCULAR | Status: DC | PRN
  Administered 2024-08-04 (×2): 1 mg via INTRAVENOUS

## 2024-08-04 MED ORDER — PHENYLEPHRINE HCL 2.5 % OP SOLN
1.0000 [drp] | OPHTHALMIC | Status: AC | PRN
  Administered 2024-08-04 (×3): 1 [drp] via OPHTHALMIC

## 2024-08-04 MED ORDER — SODIUM HYALURONATE 23MG/ML IO SOSY
PREFILLED_SYRINGE | INTRAOCULAR | Status: DC | PRN
  Administered 2024-08-04: .6 mL via INTRAOCULAR

## 2024-08-04 MED ORDER — BSS IO SOLN
INTRAOCULAR | Status: DC | PRN
  Administered 2024-08-04: 15 mL via INTRAOCULAR

## 2024-08-04 MED ORDER — SODIUM CHLORIDE 0.9% FLUSH
INTRAVENOUS | Status: DC | PRN
  Administered 2024-08-04 (×2): 5 mL via INTRAVENOUS

## 2024-08-04 MED ORDER — SIGHTPATH DOSE#1 NA HYALUR & NA CHOND-NA HYALUR IO KIT
PACK | INTRAOCULAR | Status: DC | PRN
  Administered 2024-08-04: 1 via OPHTHALMIC

## 2024-08-04 MED ORDER — TROPICAMIDE 1 % OP SOLN
1.0000 [drp] | OPHTHALMIC | Status: AC | PRN
  Administered 2024-08-04 (×3): 1 [drp] via OPHTHALMIC

## 2024-08-04 MED ORDER — STERILE WATER FOR IRRIGATION IR SOLN
Status: DC | PRN
  Administered 2024-08-04: 250 mL

## 2024-08-04 SURGICAL SUPPLY — 12 items
CLOTH BEACON ORANGE TIMEOUT ST (SAFETY) ×1 IMPLANT
EYE SHIELD UNIVERSAL CLEAR (GAUZE/BANDAGES/DRESSINGS) IMPLANT
FEE CATARACT SUITE SIGHTPATH (MISCELLANEOUS) ×1 IMPLANT
GLOVE BIOGEL PI IND STRL 7.0 (GLOVE) ×2 IMPLANT
LENS IOL TECNIS EYHANCE 22.5 (Intraocular Lens) IMPLANT
NDL HYPO 18GX1.5 BLUNT FILL (NEEDLE) ×1 IMPLANT
NEEDLE HYPO 18GX1.5 BLUNT FILL (NEEDLE) ×1 IMPLANT
PAD ARMBOARD POSITIONER FOAM (MISCELLANEOUS) ×1 IMPLANT
SYR TB 1ML LL NO SAFETY (SYRINGE) ×1 IMPLANT
SYSTEM DUOVISC (INTRAOCULAR LENS) IMPLANT
TAPE SURG TRANSPORE 1 IN (GAUZE/BANDAGES/DRESSINGS) IMPLANT
WATER STERILE IRR 250ML POUR (IV SOLUTION) ×1 IMPLANT

## 2024-08-04 NOTE — Interval H&P Note (Signed)
 History and Physical Interval Note:  08/04/2024 2:48 PM  Matthew Cain  has presented today for surgery, with the diagnosis of combined forms age related cataract, left eye.  The various methods of treatment have been discussed with the patient and family. After consideration of risks, benefits and other options for treatment, the patient has consented to  Procedure(s): PHACOEMULSIFICATION, CATARACT, WITH IOL INSERTION (Left) as a surgical intervention.  The patient's history has been reviewed, patient examined, no change in status, stable for surgery.  I have reviewed the patient's chart and labs.  Questions were answered to the patient's satisfaction.     HARRIE AGENT

## 2024-08-04 NOTE — Op Note (Signed)
 Date of procedure: 08/04/24  Pre-operative diagnosis: Visually significant age-related combined cataract, Left Eye (H25.812)  Post-operative diagnosis: Visually significant age-related combined cataract, Left Eye (H25.812)  Procedure: Removal of cataract via phacoemulsification and insertion of intra-ocular lens Vicci and Johnson DIB00 +22.5D into the capsular bag of the Left Eye  Attending surgeon: Lynwood LABOR. Myking Sar, MD, MA  Anesthesia: MAC, Topical Akten  Complications: None  Estimated Blood Loss: <23mL (minimal)  Specimens: None  Implants: As above  Indications:  Visually significant age-related cataract, Left Eye  Procedure:  The patient was seen and identified in the pre-operative area. The operative eye was identified and dilated.  The operative eye was marked.  Topical anesthesia was administered to the operative eye.     The patient was then to the operative suite and placed in the supine position.  A timeout was performed confirming the patient, procedure to be performed, and all other relevant information.   The patient's face was prepped and draped in the usual fashion for intra-ocular surgery.  A lid speculum was placed into the operative eye and the surgical microscope moved into place and focused.  An inferotemporal paracentesis was created using a 20 gauge paracentesis blade. Omidria  was injected into the anterior chamber. Shugarcaine was injected into the anterior chamber.  Viscoelastic was injected into the anterior chamber.  A temporal clear-corneal main wound incision was created using a 2.68mm microkeratome.  A continuous curvilinear capsulorrhexis was initiated using an irrigating cystitome and completed using capsulorrhexis forceps.  Hydrodissection and hydrodeliniation were performed.  Viscoelastic was injected into the anterior chamber.  A phacoemulsification handpiece and a chopper as a second instrument were used to remove the nucleus and epinucleus. The  irrigation/aspiration handpiece was used to remove any remaining cortical material.   The capsular bag was reinflated with viscoelastic, checked, and found to be intact.  The intraocular lens was inserted into the capsular bag.  The irrigation/aspiration handpiece was used to remove any remaining viscoelastic.  The clear corneal wound and paracentesis wounds were then hydrated and checked with Weck-Cels to be watertight. 0.1mL of Moxfloxacin was injected into the anterior chamber. The lid-speculum was removed.  The drape was removed.  The patient's face was cleaned with a wet and dry 4x4.    A clear shield was taped over the eye. The patient was taken to the post-operative care unit in good condition, having tolerated the procedure well.  Post-Op Instructions: The patient will follow up at [facility] for a same day post-operative evaluation and will receive all other orders and instructions.

## 2024-08-04 NOTE — Transfer of Care (Addendum)
 Immediate Anesthesia Transfer of Care Note  Patient: Matthew Cain  Procedure(s) Performed: PHACOEMULSIFICATION, CATARACT, WITH IOL INSERTION (Left: Eye)  Patient Location: Short Stay  Anesthesia Type:MAC  Level of Consciousness: awake and patient cooperative  Airway & Oxygen Therapy: Patient Spontanous Breathing  Post-op Assessment: Report given to RN and Post -op Vital signs reviewed and stable  Post vital signs: Reviewed and stable  Last Vitals:  Vitals Value Taken Time  BP 145/81 08/04/24   1516  Temp 36.8 08/04/24   1516  Pulse 75 08/04/24   1516  Resp 15 08/04/24   1516  SpO2 100% 08/04/24   1516    Last Pain:  Vitals:   08/04/24 1330  TempSrc: Oral  PainSc: 0-No pain      Patients Stated Pain Goal: 5 (08/04/24 1330)  Complications: No notable events documented.

## 2024-08-04 NOTE — Discharge Instructions (Addendum)
 Please discharge patient when stable, will follow up today with Dr. June Leap at the [facility] office immediately following discharge.  Leave shield in place until visit.  All paperwork with discharge instructions will be given at the office.  [facility] Address:  [address]  [place], [place] [address]

## 2024-08-04 NOTE — Anesthesia Preprocedure Evaluation (Signed)
 Anesthesia Evaluation  Patient identified by MRN, date of birth, ID band Patient awake    Reviewed: Allergy & Precautions, H&P , NPO status , Patient's Chart, lab work & pertinent test results, reviewed documented beta blocker date and time   Airway Mallampati: II  TM Distance: >3 FB Neck ROM: full    Dental no notable dental hx.    Pulmonary pneumonia, former smoker   Pulmonary exam normal breath sounds clear to auscultation       Cardiovascular Exercise Tolerance: Good hypertension, + CAD, + Past MI and +CHF   Rhythm:regular Rate:Normal     Neuro/Psych  PSYCHIATRIC DISORDERS Anxiety Depression    CVA    GI/Hepatic Neg liver ROS,GERD  ,,  Endo/Other  diabetes    Renal/GU Renal disease  negative genitourinary   Musculoskeletal   Abdominal   Peds  Hematology negative hematology ROS (+)   Anesthesia Other Findings   Reproductive/Obstetrics negative OB ROS                              Anesthesia Physical Anesthesia Plan  ASA: 3  Anesthesia Plan: MAC   Post-op Pain Management:    Induction:   PONV Risk Score and Plan:   Airway Management Planned:   Additional Equipment:   Intra-op Plan:   Post-operative Plan:   Informed Consent: I have reviewed the patients History and Physical, chart, labs and discussed the procedure including the risks, benefits and alternatives for the proposed anesthesia with the patient or authorized representative who has indicated his/her understanding and acceptance.     Dental Advisory Given  Plan Discussed with: CRNA  Anesthesia Plan Comments:         Anesthesia Quick Evaluation

## 2024-08-04 NOTE — Anesthesia Procedure Notes (Signed)
 Date/Time: 08/04/2024 2:56 PM  Performed by: Para Jerelene CROME, CRNAOxygen Delivery Method: Nasal cannula

## 2024-08-05 ENCOUNTER — Encounter (HOSPITAL_COMMUNITY): Payer: Self-pay | Admitting: Ophthalmology

## 2024-08-05 NOTE — Telephone Encounter (Signed)
 The patient has been notified of the result and verbalized understanding.  All questions (if any) were answered. Aleck LOISE Bill, RN 08/05/2024 6:29 PM  Repeat CT of aorta ordered for 1 year out. Results sent to PCP.

## 2024-08-05 NOTE — Telephone Encounter (Signed)
-----   Message from Lynwood Schilling sent at 08/03/2024 11:56 AM EDT ----- Aorta was 44 mm .  No change in therapy.  This is slightly larger than previous.  However, we can follow again in one year.  Call Matthew Cain with the results and send results to Tobie Guy, DO.   Repeat a CT in one year.  ----- Message ----- From: Interface, Rad Results In Sent: 07/30/2024  10:47 AM EDT To: Lynwood Schilling, MD

## 2024-08-07 NOTE — Anesthesia Postprocedure Evaluation (Signed)
 Anesthesia Post Note  Patient: Matthew Cain  Procedure(s) Performed: PHACOEMULSIFICATION, CATARACT, WITH IOL INSERTION (Left: Eye)  Patient location during evaluation: Phase II Anesthesia Type: MAC Level of consciousness: awake Pain management: pain level controlled Vital Signs Assessment: post-procedure vital signs reviewed and stable Respiratory status: spontaneous breathing and respiratory function stable Cardiovascular status: blood pressure returned to baseline and stable Postop Assessment: no headache and no apparent nausea or vomiting Anesthetic complications: no Comments: Late entry   No notable events documented.   Last Vitals:  Vitals:   08/04/24 1330 08/04/24 1516  BP: (!) 146/77 (!) 145/81  Pulse: 72 75  Resp: 18 15  Temp: 37.2 C 36.8 C  SpO2: 99% 100%    Last Pain:  Vitals:   08/04/24 1516  TempSrc: Oral  PainSc: 0-No pain                 Yvonna JINNY Bosworth

## 2024-08-08 ENCOUNTER — Encounter (HOSPITAL_COMMUNITY): Payer: Self-pay

## 2024-08-08 ENCOUNTER — Encounter (HOSPITAL_COMMUNITY)
Admission: RE | Admit: 2024-08-08 | Discharge: 2024-08-08 | Disposition: A | Discharge status: Home / Self Care | Source: Ambulatory Visit | Attending: Ophthalmology | Admitting: Ophthalmology

## 2024-08-12 NOTE — H&P (Signed)
 Surgical History & Physical  Patient Name: Matthew Cain  DOB: Nov 19, 1955  Surgery: Cataract extraction with intraocular lens implant phacoemulsification; Right Eye Surgeon: Lynwood Hermann MD Surgery Date: 08/15/2024 Pre-Op Date: 08/07/2024  HPI: A 45 Yr. old male patient returning for 3 day post op OS//Pre Op OD. Pt states: it's doing okay, but I have a little pain at the top of my eye, and my nose is really sore I'm not sure what they did but it is really sore. It's been really itchy though, on the corners, but I've tried not to touch it. But to me it looks like the vision is improved. Gtts: Omni OS tid (hadn't used any drops up until today, used Omni in exam room but not in the last 3 days.) Patient also here for persistent blurry vision in the right eye. The vision has been getting worse for several months. The blurry vision is constant and worsening. The patient has trouble with glare when driving at night. The patient has trouble reading up close. This is negatively affecting the patient's quality of life and the patient is unable to function adequately in life with the current level of vision. HPI Completed by Dr. Lynwood Hermann  Medical History: Cataracts  Arthritis Diabetes High Blood Pressure LDL Stroke Allergies  Review of Systems Allergic/Immunologic Seasonal Allergies Cardiovascular High Blood Pressure Endocrine diabetes Musculoskeletal arthritis pains Neurological Stroke All recorded systems are negative except as noted above.  Social Former smoker  Medication Prednisolone-moxiflox-bromfen, Prednisolone-moxiflox-bromfen,  Carvedilol , Amlodipine , Atorvastatin , Metformin , Buspirone , Montelukast, Lisinopril , Ipratropium bromide , Isosorbide  mononitrate, Famotidine, Jardiance , Duloxetine, Spironolactone , Mounjaro , Pantoprazole , Mounjaro , Dexcom G7 Sensor  Sx/Procedures Phaco c IOL OS,  Shoulder surgery - lt, Vasectomy, Artery cleaning  Drug Allergies  Tomatoes,  Gabapentin  History & Physical: Heent: cataract NECK: supple without bruits LUNGS: lungs clear to auscultation CV: regular rate and rhythm Abdomen: soft and non-tender  Impression & Plan: Assessment: 1.  CATARACT EXTRACTION STATUS; Left Eye (Z98.42) 2.  COMBINED FORMS AGE RELATED CATARACT; Right Eye (H25.811)  Plan: 1.  1 week after cataract surgery. Doing well with improved vision and normal eye pressure. Call with any problems or concerns. Continue Pred-Moxi-Brom 2x/day for 3 more weeks.  2.  Cataract accounts for the patient's decreased vision. This visual impairment is not correctable with a tolerable change in glasses or contact lenses. Cataract surgery with an implantation of a new lens should significantly improve the visual and functional status of the patient. Discussed all risks, benefits, alternatives, and potential complications. Discussed the procedures and recovery. Patient desires to have surgery. A-scan ordered and performed today for intra-ocular lens calculations. The surgery will be performed in order to improve vision for driving, reading, and for eye examinations. Recommend phacoemulsification with intra-ocular lens. Recommend Dextenza  for post-operative pain and inflammation. History of refractive Surgery: None Use of Eye Pressure Lowering Drops: None Right Eye Surgery required to correct imbalance of vision. Standard Lens only Dilates well - shugarcaine or Lidocaine +Omidira by protocol

## 2024-08-14 NOTE — Anesthesia Preprocedure Evaluation (Signed)
 Anesthesia Evaluation  Patient identified by MRN, date of birth, ID band Patient awake    Reviewed: Allergy & Precautions, H&P , NPO status , Patient's Chart, lab work & pertinent test results, reviewed documented beta blocker date and time   Airway Mallampati: II  TM Distance: >3 FB Neck ROM: full    Dental no notable dental hx. (+) Teeth Intact, Dental Advisory Given   Pulmonary pneumonia, former smoker   Pulmonary exam normal breath sounds clear to auscultation       Cardiovascular hypertension, + CAD, + Past MI and +CHF  Normal cardiovascular exam Rhythm:regular Rate:Normal  Good EF. Grade 1 diastolic dysfunction   Neuro/Psych  PSYCHIATRIC DISORDERS Anxiety Depression    CVA    GI/Hepatic Neg liver ROS,GERD  ,,  Endo/Other  diabetes, Well Controlled, Type 2    Renal/GU Renal InsufficiencyRenal disease  negative genitourinary   Musculoskeletal   Abdominal   Peds  Hematology negative hematology ROS (+)   Anesthesia Other Findings   Reproductive/Obstetrics negative OB ROS                              Anesthesia Physical Anesthesia Plan  ASA: 3  Anesthesia Plan: MAC   Post-op Pain Management: Minimal or no pain anticipated   Induction:   PONV Risk Score and Plan: Midazolam   Airway Management Planned: Nasal Cannula and Natural Airway  Additional Equipment: None  Intra-op Plan:   Post-operative Plan:   Informed Consent: I have reviewed the patients History and Physical, chart, labs and discussed the procedure including the risks, benefits and alternatives for the proposed anesthesia with the patient or authorized representative who has indicated his/her understanding and acceptance.     Dental Advisory Given  Plan Discussed with: CRNA  Anesthesia Plan Comments:          Anesthesia Quick Evaluation

## 2024-08-15 ENCOUNTER — Ambulatory Visit (HOSPITAL_COMMUNITY): Admitting: Anesthesiology

## 2024-08-15 ENCOUNTER — Encounter (HOSPITAL_COMMUNITY)
Admission: RE | Disposition: A | Discharge status: Home / Self Care | Payer: Self-pay | Source: Home / Self Care | Attending: Ophthalmology

## 2024-08-15 ENCOUNTER — Encounter (HOSPITAL_COMMUNITY): Payer: Self-pay | Admitting: Ophthalmology

## 2024-08-15 ENCOUNTER — Ambulatory Visit (HOSPITAL_COMMUNITY)
Admission: RE | Admit: 2024-08-15 | Discharge: 2024-08-15 | Disposition: A | Discharge status: Home / Self Care | Attending: Ophthalmology | Admitting: Ophthalmology

## 2024-08-15 DIAGNOSIS — Z9842 Cataract extraction status, left eye: Secondary | ICD-10-CM | POA: Insufficient documentation

## 2024-08-15 DIAGNOSIS — Z7984 Long term (current) use of oral hypoglycemic drugs: Secondary | ICD-10-CM | POA: Diagnosis not present

## 2024-08-15 DIAGNOSIS — Z7985 Long-term (current) use of injectable non-insulin antidiabetic drugs: Secondary | ICD-10-CM | POA: Insufficient documentation

## 2024-08-15 DIAGNOSIS — Z8673 Personal history of transient ischemic attack (TIA), and cerebral infarction without residual deficits: Secondary | ICD-10-CM | POA: Diagnosis not present

## 2024-08-15 DIAGNOSIS — I5032 Chronic diastolic (congestive) heart failure: Secondary | ICD-10-CM | POA: Diagnosis not present

## 2024-08-15 DIAGNOSIS — I130 Hypertensive heart and chronic kidney disease with heart failure and stage 1 through stage 4 chronic kidney disease, or unspecified chronic kidney disease: Secondary | ICD-10-CM | POA: Diagnosis not present

## 2024-08-15 DIAGNOSIS — Z87891 Personal history of nicotine dependence: Secondary | ICD-10-CM | POA: Diagnosis not present

## 2024-08-15 DIAGNOSIS — F419 Anxiety disorder, unspecified: Secondary | ICD-10-CM | POA: Insufficient documentation

## 2024-08-15 DIAGNOSIS — I2510 Atherosclerotic heart disease of native coronary artery without angina pectoris: Secondary | ICD-10-CM | POA: Insufficient documentation

## 2024-08-15 DIAGNOSIS — N182 Chronic kidney disease, stage 2 (mild): Secondary | ICD-10-CM

## 2024-08-15 DIAGNOSIS — E119 Type 2 diabetes mellitus without complications: Secondary | ICD-10-CM

## 2024-08-15 DIAGNOSIS — I252 Old myocardial infarction: Secondary | ICD-10-CM | POA: Insufficient documentation

## 2024-08-15 DIAGNOSIS — K219 Gastro-esophageal reflux disease without esophagitis: Secondary | ICD-10-CM | POA: Diagnosis not present

## 2024-08-15 DIAGNOSIS — E1136 Type 2 diabetes mellitus with diabetic cataract: Secondary | ICD-10-CM | POA: Diagnosis not present

## 2024-08-15 DIAGNOSIS — F32A Depression, unspecified: Secondary | ICD-10-CM | POA: Diagnosis not present

## 2024-08-15 DIAGNOSIS — I110 Hypertensive heart disease with heart failure: Secondary | ICD-10-CM | POA: Diagnosis not present

## 2024-08-15 DIAGNOSIS — H25811 Combined forms of age-related cataract, right eye: Secondary | ICD-10-CM | POA: Diagnosis present

## 2024-08-15 DIAGNOSIS — I5042 Chronic combined systolic (congestive) and diastolic (congestive) heart failure: Secondary | ICD-10-CM

## 2024-08-15 HISTORY — PX: CATARACT EXTRACTION W/PHACO: SHX586

## 2024-08-15 LAB — GLUCOSE, CAPILLARY: Glucose-Capillary: 151 mg/dL — ABNORMAL HIGH (ref 70–99)

## 2024-08-15 SURGERY — PHACOEMULSIFICATION, CATARACT, WITH IOL INSERTION
Anesthesia: Monitor Anesthesia Care | Site: Eye | Laterality: Right

## 2024-08-15 MED ORDER — TROPICAMIDE 1 % OP SOLN
1.0000 [drp] | OPHTHALMIC | Status: DC | PRN
  Administered 2024-08-15 (×2): 1 [drp] via OPHTHALMIC

## 2024-08-15 MED ORDER — SODIUM HYALURONATE 23MG/ML IO SOSY
PREFILLED_SYRINGE | INTRAOCULAR | Status: DC | PRN
  Administered 2024-08-15: .6 mL via INTRAOCULAR

## 2024-08-15 MED ORDER — STERILE WATER FOR IRRIGATION IR SOLN
Status: DC | PRN
  Administered 2024-08-15: 1

## 2024-08-15 MED ORDER — TETRACAINE HCL 0.5 % OP SOLN
1.0000 [drp] | OPHTHALMIC | Status: DC | PRN
  Administered 2024-08-15 (×2): 1 [drp] via OPHTHALMIC

## 2024-08-15 MED ORDER — PHENYLEPHRINE-KETOROLAC 1-0.3 % IO SOLN
INTRAOCULAR | Status: DC | PRN
  Administered 2024-08-15: 500 mL via OPHTHALMIC

## 2024-08-15 MED ORDER — LIDOCAINE HCL 3.5 % OP GEL
1.0000 | Freq: Once | OPHTHALMIC | Status: AC
  Administered 2024-08-15: 1 via OPHTHALMIC

## 2024-08-15 MED ORDER — MOXIFLOXACIN HCL 5 MG/ML IO SOLN
INTRAOCULAR | Status: DC | PRN
  Administered 2024-08-15: .2 mL via INTRACAMERAL

## 2024-08-15 MED ORDER — LIDOCAINE HCL (PF) 1 % IJ SOLN
INTRAMUSCULAR | Status: DC | PRN
  Administered 2024-08-15: 1 mL

## 2024-08-15 MED ORDER — LACTATED RINGERS IV SOLN: INTRAVENOUS | Status: DC

## 2024-08-15 MED ORDER — MIDAZOLAM HCL 2 MG/2ML IJ SOLN
INTRAMUSCULAR | Status: DC | PRN
Start: 2024-08-15 — End: 2024-08-15
  Administered 2024-08-15: 2 mg via INTRAVENOUS

## 2024-08-15 MED ORDER — MIDAZOLAM HCL 2 MG/2ML IJ SOLN
INTRAMUSCULAR | Status: AC
  Filled 2024-08-15: qty 2

## 2024-08-15 MED ORDER — POVIDONE-IODINE 5 % OP SOLN
OPHTHALMIC | Status: DC | PRN
  Administered 2024-08-15: 1 via OPHTHALMIC

## 2024-08-15 MED ORDER — ONDANSETRON HCL 4 MG/2ML IJ SOLN
INTRAMUSCULAR | Status: AC
  Filled 2024-08-15: qty 2

## 2024-08-15 MED ORDER — PHENYLEPHRINE HCL 2.5 % OP SOLN
1.0000 [drp] | OPHTHALMIC | Status: DC | PRN
  Administered 2024-08-15 (×2): 1 [drp] via OPHTHALMIC

## 2024-08-15 MED ORDER — SODIUM HYALURONATE 10 MG/ML IO SOLUTION
PREFILLED_SYRINGE | INTRAOCULAR | Status: DC | PRN
  Administered 2024-08-15: .85 mL via INTRAOCULAR

## 2024-08-15 MED ORDER — BSS IO SOLN
INTRAOCULAR | Status: DC | PRN
  Administered 2024-08-15: 15 mL via INTRAOCULAR

## 2024-08-15 MED ORDER — SODIUM CHLORIDE 0.9% FLUSH
INTRAVENOUS | Status: DC | PRN
  Administered 2024-08-15: 3 mL via INTRAVENOUS

## 2024-08-15 MED ORDER — ALBUTEROL SULFATE HFA 108 (90 BASE) MCG/ACT IN AERS
INHALATION_SPRAY | RESPIRATORY_TRACT | Status: AC
  Filled 2024-08-15: qty 6.7

## 2024-08-15 SURGICAL SUPPLY — 11 items
CLOTH BEACON ORANGE TIMEOUT ST (SAFETY) ×1 IMPLANT
EYE SHIELD UNIVERSAL CLEAR (GAUZE/BANDAGES/DRESSINGS) IMPLANT
FEE CATARACT SUITE SIGHTPATH (MISCELLANEOUS) ×1 IMPLANT
GLOVE BIOGEL PI IND STRL 7.0 (GLOVE) ×2 IMPLANT
LENS IOL TECNIS EYHANCE 22.0 (Intraocular Lens) IMPLANT
NDL HYPO 18GX1.5 BLUNT FILL (NEEDLE) ×1 IMPLANT
NEEDLE HYPO 18GX1.5 BLUNT FILL (NEEDLE) ×1 IMPLANT
PAD ARMBOARD POSITIONER FOAM (MISCELLANEOUS) ×1 IMPLANT
SYR TB 1ML LL NO SAFETY (SYRINGE) ×1 IMPLANT
TAPE SURG TRANSPORE 1 IN (GAUZE/BANDAGES/DRESSINGS) IMPLANT
WATER STERILE IRR 250ML POUR (IV SOLUTION) ×1 IMPLANT

## 2024-08-15 NOTE — Transfer of Care (Signed)
 Immediate Anesthesia Transfer of Care Note  Patient: Matthew Cain  Procedure(s) Performed: PHACOEMULSIFICATION, CATARACT, WITH IOL INSERTION (Right: Eye)  Patient Location: Short Stay  Anesthesia Type:MAC  Level of Consciousness: drowsy and patient cooperative  Airway & Oxygen Therapy: Patient Spontanous Breathing  Post-op Assessment: Report given to RN and Post -op Vital signs reviewed and stable  Post vital signs: Reviewed and stable  Last Vitals:  Vitals Value Taken Time  BP 122/74 08/15/24 08:18  Temp 36.8 C 08/15/24 08:18  Pulse 69 08/15/24 08:18  Resp 18 08/15/24 08:18  SpO2 97 % 08/15/24 08:18    Last Pain:  Vitals:   08/15/24 0818  TempSrc: Oral  PainSc: 0-No pain      Patients Stated Pain Goal: 5 (08/15/24 0709)  Complications: No notable events documented.

## 2024-08-15 NOTE — Interval H&P Note (Signed)
 History and Physical Interval Note:  08/15/2024 7:46 AM  Matthew Cain  has presented today for surgery, with the diagnosis of combined forms age related cataract, right eye.  The various methods of treatment have been discussed with the patient and family. After consideration of risks, benefits and other options for treatment, the patient has consented to  Procedure(s): PHACOEMULSIFICATION, CATARACT, WITH IOL INSERTION (Right) as a surgical intervention.  The patient's history has been reviewed, patient examined, no change in status, stable for surgery.  I have reviewed the patient's chart and labs.  Questions were answered to the patient's satisfaction.     HARRIE AGENT

## 2024-08-15 NOTE — Discharge Instructions (Addendum)
 Please discharge patient when stable, will follow up today with Dr. June Leap at the [facility] office immediately following discharge.  Leave shield in place until visit.  All paperwork with discharge instructions will be given at the office.  [facility] Address:  [address]  [place], [place] [address]

## 2024-08-15 NOTE — Anesthesia Postprocedure Evaluation (Signed)
 Anesthesia Post Note  Patient: Matthew Cain  Procedure(s) Performed: PHACOEMULSIFICATION, CATARACT, WITH IOL INSERTION (Right: Eye)  Patient location during evaluation: PACU Anesthesia Type: MAC Level of consciousness: awake and alert Pain management: pain level controlled Vital Signs Assessment: post-procedure vital signs reviewed and stable Respiratory status: spontaneous breathing, nonlabored ventilation, respiratory function stable and patient connected to nasal cannula oxygen Cardiovascular status: stable and blood pressure returned to baseline Postop Assessment: no apparent nausea or vomiting Anesthetic complications: no   There were no known notable events for this encounter.   Last Vitals:  Vitals:   08/15/24 0709 08/15/24 0818  BP: 133/69 122/74  Pulse: 66 69  Resp: 18 18  Temp: 36.8 C 36.8 C  SpO2: 98% 97%    Last Pain:  Vitals:   08/15/24 0818  TempSrc: Oral  PainSc: 0-No pain                 Maisen Klingler L Wray Goehring

## 2024-08-15 NOTE — Op Note (Signed)
 Date of procedure: 08/15/24  Pre-operative diagnosis:  Visually significant combined form age-related cataract, Right Eye (H25.811)  Post-operative diagnosis:  Visually significant combined form age-related cataract, Right Eye (H25.811)  Procedure: Removal of cataract via phacoemulsification and insertion of intra-ocular lens Johnson and Johnson DIB00 +22.0D into the capsular bag of the Right Eye  Attending surgeon: Lynwood LABOR. Teresa Nicodemus, MD, MA  Anesthesia: MAC, Topical Akten  Complications: None  Estimated Blood Loss: <25mL (minimal)  Specimens: None  Implants: As above  Indications:  Visually significant age-related cataract, Right Eye  Procedure:  The patient was seen and identified in the pre-operative area. The operative eye was identified and dilated.  The operative eye was marked.  Topical anesthesia was administered to the operative eye.     The patient was then to the operative suite and placed in the supine position.  A timeout was performed confirming the patient, procedure to be performed, and all other relevant information.   The patient's face was prepped and draped in the usual fashion for intra-ocular surgery.  A lid speculum was placed into the operative eye and the surgical microscope moved into place and focused.  A superotemporal paracentesis was created using a 20 gauge paracentesis blade. Omidria  was injected into the anterior chamber. Shugarcaine was injected into the anterior chamber.  Viscoelastic was injected into the anterior chamber.  A temporal clear-corneal main wound incision was created using a 2.3mm microkeratome.  A continuous curvilinear capsulorrhexis was initiated using an irrigating cystitome and completed using capsulorrhexis forceps.  Hydrodissection and hydrodeliniation were performed.  Viscoelastic was injected into the anterior chamber.  A phacoemulsification handpiece and a chopper as a second instrument were used to remove the nucleus and epinucleus.  The irrigation/aspiration handpiece was used to remove any remaining cortical material.   The capsular bag was reinflated with viscoelastic, checked, and found to be intact.  The intraocular lens was inserted into the capsular bag.  The irrigation/aspiration handpiece was used to remove any remaining viscoelastic.  The clear corneal wound and paracentesis wounds were then hydrated and checked with Weck-Cels to be watertight. 0.1mL of Moxfloxacin was injected into the anterior chamber. The lid-speculum was removed.  The drape was removed.  The patient's face was cleaned with a wet and dry 4x4. A clear shield was taped over the eye. The patient was taken to the post-operative care unit in good condition, having tolerated the procedure well.  Post-Op Instructions: The patient will follow up at [facility] for a same day post-operative evaluation and will receive all other orders and instructions.

## 2024-08-15 NOTE — Anesthesia Procedure Notes (Signed)
 Date/Time: 08/15/2024 7:51 AM  Performed by: Barbarann Verneita RAMAN, CRNAPre-anesthesia Checklist: Patient identified, Emergency Drugs available, Suction available, Timeout performed and Patient being monitored Patient Re-evaluated:Patient Re-evaluated prior to induction Oxygen Delivery Method: Nasal Cannula

## 2024-09-03 ENCOUNTER — Other Ambulatory Visit: Payer: Self-pay | Admitting: Cardiology

## 2024-09-18 ENCOUNTER — Other Ambulatory Visit: Payer: Self-pay | Admitting: Cardiology

## 2024-10-22 ENCOUNTER — Encounter: Payer: Self-pay | Admitting: Endocrinology

## 2024-10-22 ENCOUNTER — Other Ambulatory Visit

## 2024-10-22 ENCOUNTER — Ambulatory Visit: Payer: Self-pay | Admitting: Endocrinology

## 2024-10-22 ENCOUNTER — Ambulatory Visit (INDEPENDENT_AMBULATORY_CARE_PROVIDER_SITE_OTHER): Admitting: Endocrinology

## 2024-10-22 VITALS — BP 116/70 | HR 85 | Resp 16 | Ht 70.0 in | Wt 218.8 lb

## 2024-10-22 DIAGNOSIS — G629 Polyneuropathy, unspecified: Secondary | ICD-10-CM | POA: Diagnosis not present

## 2024-10-22 DIAGNOSIS — Z794 Long term (current) use of insulin: Secondary | ICD-10-CM | POA: Diagnosis not present

## 2024-10-22 DIAGNOSIS — E1169 Type 2 diabetes mellitus with other specified complication: Secondary | ICD-10-CM | POA: Diagnosis not present

## 2024-10-22 LAB — POCT GLYCOSYLATED HEMOGLOBIN (HGB A1C): Hemoglobin A1C: 6.8 % — AB (ref 4.0–5.6)

## 2024-10-22 MED ORDER — CAPSAICIN 0.025 % EX LOTN: TOPICAL_LOTION | CUTANEOUS | 3 refills | Status: AC

## 2024-10-22 MED ORDER — TIRZEPATIDE 15 MG/0.5ML ~~LOC~~ SOAJ: 15.0000 mg | SUBCUTANEOUS | 4 refills | Status: AC

## 2024-10-22 NOTE — Progress Notes (Signed)
 Outpatient Endocrinology Note Matthew Rachal, MD  10/22/24  Patient's Name: Matthew Cain    DOB: [birth year]    MRN: [id number]                                                    REASON OF VISIT: Follow-up of type 2 diabetes mellitus  REFERRING PROVIDER: Tobie Guy, DO  PCP: Matthew Guy, DO  HISTORY OF PRESENT ILLNESS:   Matthew Cain is a 68 y.o. old male with past medical history listed below, is here for follow-up for type 2 diabetes mellitus.   Pertinent Diabetes History: Patient was diagnosed with type 2 diabetes mellitus in his age of 63s.  Patient has uncontrolled type 2 diabetes mellitus, was initially seen in November 2024. Patient has been on insulin  therapy for several years.  He has hemoglobin A1c mostly in the range of 8%.  Used to be as high as 10 to 12% in the past. He had seen diabetic educator in the July 2024.  No history of diabetes ketoacidosis and hospitalization due to diabetes related condition.  No history of pancreatitis.  No family history of medullary thyroid  cancer, MEN 2 syndrome.  Chronic Diabetes Complications : Retinopathy: ? no. Last ophthalmology exam was done on annually, following with ophthalmology regularly.  Nephropathy: Yes, microalbuminuria, on ACE/ARB / lisinopril  Peripheral neuropathy: yes, tingling of toes.  Tried gabapentin and Lyrica, ?  Cymbalta in the past, did not like them. Coronary artery disease: CAD Stroke: no  Relevant comorbidities and cardiovascular risk factors: Obesity: yes Body mass index is 31.39 kg/m.  Hypertension: Yes  Hyperlipidemia : Yes, on statin   Current / Home Diabetic regimen includes:  Lantus  45 units daily in the morning.  Jardiance  25 mg daily. Metformin  1000 mg daily.  Mounjaro  12.5 mg weekly.   Prior diabetic medications: On the higher dose of Lantus  up to 65 units 2 times a day in the past, gradually decreased after starting Mounjaro .  Glipizide  was stopped in January 2025.  Glycemic data:     CONTINUOUS GLUCOSE MONITORING SYSTEM (CGMS) INTERPRETATION:                         Dexcom G7 CGM-  Sensor Download (Sensor download was reviewed and summarized below.) Dates: November 27 to August 22, 2024, 14 days   Glucose Management Indicator: 7.7%  Sensor usage: 96%    Previous :    Previous:    Impression: Frequent hyperglycemia postprandially usually mild with blood sugar in the low 200 range.  Some of the test perfectly acceptable blood sugar throughout the day and overnight.  Blood sugar in between the meals and overnight acceptable.  No hypoglycemia.  Hypoglycemia: Patient has no hypoglycemic episodes. Patient has hypoglycemia awareness.  Factors modifying glucose control: 1.  Diabetic diet assessment: 2-3 meals a day. Occasional candy and soda.  Decreased appetite after being on Mounjaro .  2.  Staying active or exercising: no formal exercise.   3.  Medication compliance: compliant all of the time.  Interval history  CGM data as reviewed above.  Diabetes regimen is reviewed and noted above.  He has been tolerating Mounjaro  well, denies any GI issues.  He has been taking Lantus  45 units daily.  He complains of numbness and tingling to feet and using pad, he did  not like Lyrica and gabapentin in the past.  No vision problem.  No other complaints today.  REVIEW OF SYSTEMS As per history of present illness.   PAST MEDICAL HISTORY: Past Medical History:  Diagnosis Date   Amputation finger    left ring finger   Anxiety    CAD (coronary artery disease)    LHC 10/12:  mild plaque in the LAD but no obstructive CAD   Chronic diastolic heart failure (HCC)    EF 55-60%, grade 1 diastolic dysfunction; no WMAs, echo, 11/2011; s/p pulmo. edema c/b VDRF 11/12; Echo 09/08/11: severe LVH, no SAM, no LVOT gradient, EF 55%, mild BAE, mild reduced RVSF.   CKD (chronic kidney disease)    Depression    Diabetes mellitus    Type II   GERD (gastroesophageal reflux disease)     History of kidney stones    History of right ACA stroke    10/12  --  MRI 09/13/11:  Acute right ACA infarct involving corpus callosum.  MRA 09/13/11: severe intracranial ASD, right ACA occluded, left PCA and right MCA with severe disease.  Dopplers were neg for significant ICA stenosis   HLD (hyperlipidemia)    Hypertension    Renal insufficiency    Spermatocele    Thoracic aortic aneurysm    CT in 10/12: 4.4 cm ascending thoracic aortic aneurysm    PAST SURGICAL HISTORY: Past Surgical History:  Procedure Laterality Date    vasectomy     bilateral   CARDIAC CATHETERIZATION  09/09/11   CATARACT EXTRACTION W/PHACO Left 08/04/2024   Procedure: PHACOEMULSIFICATION, CATARACT, WITH IOL INSERTION;  Surgeon: Matthew Agent, MD;  Location: [facility];  Service: Ophthalmology;  Laterality: Left;  CDE: 7.79   CATARACT EXTRACTION W/PHACO Right 08/15/2024   Procedure: PHACOEMULSIFICATION, CATARACT, WITH IOL INSERTION;  Surgeon: Matthew Agent, MD;  Location: [facility];  Service: Ophthalmology;  Laterality: Right;  CDE: 10.18   IR ANGIO EXTERNAL CAROTID SEL EXT CAROTID BILAT MOD SED  09/07/2022   IR ANGIO EXTERNAL CAROTID SEL EXT CAROTID UNI R MOD SED  06/03/2021   IR ANGIO INTRA EXTRACRAN SEL COM CAROTID INNOMINATE UNI L MOD SED  06/03/2021   IR ANGIO INTRA EXTRACRAN SEL INTERNAL CAROTID BILAT MOD SED  09/07/2022   IR ANGIO INTRA EXTRACRAN SEL INTERNAL CAROTID UNI R MOD SED  06/03/2021   IR ANGIO VERTEBRAL SEL VERTEBRAL BILAT MOD SED  06/03/2021   IR ANGIO VERTEBRAL SEL VERTEBRAL UNI R MOD SED  09/08/2022   IR ANGIOGRAM FOLLOW UP STUDY  09/28/2022   IR ANGIOGRAM FOLLOW UP STUDY  09/28/2022   IR ANGIOGRAM FOLLOW UP STUDY  09/28/2022   IR ANGIOGRAM FOLLOW UP STUDY  09/28/2022   IR ANGIOGRAM FOLLOW UP STUDY  09/28/2022   IR ANGIOGRAM FOLLOW UP STUDY  09/28/2022   IR ANGIOGRAM FOLLOW UP STUDY  09/28/2022   IR ANGIOGRAM FOLLOW UP STUDY  09/28/2022   IR ANGIOGRAM FOLLOW UP STUDY  09/28/2022   IR ANGIOGRAM  FOLLOW UP STUDY  09/28/2022   IR CT HEAD LTD  09/28/2022   IR TRANSCATH/EMBOLIZ  09/28/2022   IR US  GUIDE VASC ACCESS RIGHT  06/03/2021   IR US  GUIDE VASC ACCESS RIGHT  09/07/2022   IR US  GUIDE VASC ACCESS RIGHT  09/28/2022   IR US  GUIDE VASC ACCESS RIGHT  09/28/2022   RADIOLOGY WITH ANESTHESIA N/A 09/28/2022   Procedure: Embolization of emissary vein;  Surgeon: de Macedo Rodrigues, Katyucia, MD;  Location: [facility] OR;  Service: Radiology;  Laterality: N/A;   ROTATOR CUFF REPAIR  07/2015   LEFT   SPERMATOCELECTOMY  1990's   RT side   SPERMATOCELECTOMY Left 07/20/2015   Procedure: LEFT HYDROCELECTOMY;  Surgeon: Norleen Seltzer, MD;  Location: [facility];  Service: Urology;  Laterality: Left;   TRANSURETHRAL INCISION OF PROSTATE N/A 07/20/2015   Procedure: TRANSURETHRAL INCISION OF THE PROSTATE (TUIP);  Surgeon: Norleen Seltzer, MD;  Location: [facility];  Service: Urology;  Laterality: N/A;    ALLERGIES: Allergies  Allergen Reactions   Gabapentin Nausea And Vomiting   Tomato Rash    FAMILY HISTORY:  Family History  Problem Relation Age of Onset   Heart attack Mother    Cerebral aneurysm Father    Cerebral aneurysm Other    Diabetes Other    Hypertension Other     SOCIAL HISTORY: Social History   Socioeconomic History   Marital status: Divorced    Spouse name: Not on file   Number of children: Not on file   Years of education: Not on file   Highest education level: Not on file  Occupational History   Not on file  Tobacco Use   Smoking status: Former    Current packs/day: 0.00    Average packs/day: 1 pack/day for 30.0 years (30.0 ttl pk-yrs)    Types: Cigarettes    Start date: 11/13/1964    Quit date: 11/13/1994    Years since quitting: 29.9   Smokeless tobacco: Never  Vaping Use   Vaping status: Never Used  Substance and Sexual Activity   Alcohol use: Yes    Comment: Occasional   Drug use: No   Sexual activity: Not Currently  Other Topics Concern   Not on file  Social History Narrative    Lives alone   Right Handed   Drinks 2-3 cups caffeine  daily   Social Drivers of Health   Financial Resource Strain: Low Risk (09/27/2023)   Received from [facility]   Overall Financial Resource Strain (CARDIA)    Difficulty of Paying Living Expenses: Not hard at all  Food Insecurity: No Food Insecurity (07/25/2024)   Received from [facility]   Hunger Vital Sign    Within the past 12 months, you worried that your food would run out before you got the money to buy more.: Never true    Within the past 12 months, the food you bought just didn't last and you didn't have money to get more.: Never true  Transportation Needs: No Transportation Needs (07/25/2024)   Received from [facility]   PRAPARE - Transportation    Lack of Transportation (Medical): No    Lack of Transportation (Non-Medical): No  Physical Activity: Sufficiently Active (09/27/2023)   Received from [facility]   Exercise Vital Sign    On average, how many days per week do you engage in moderate to strenuous exercise (like a brisk walk)?: 5 days    On average, how many minutes do you engage in exercise at this level?: 30 min  Stress: No Stress Concern Present (09/27/2023)   Received from [facility] - Occupational Stress Questionnaire    Feeling of Stress : Not at all  Social Connections: Unknown (09/27/2023)   Received from [facility]   Social Connection and Isolation Panel    In a typical week, how many times do you talk on the phone with family, friends, or neighbors?: More than three times a week  How often do you get together with friends or relatives?: Three times a week    How often do you attend church or religious services?: 1 to 4 times per year    Do you belong to any clubs or organizations such as church groups, unions, fraternal or athletic groups, or school groups?: No    How often do you attend meetings of the clubs or organizations  you belong to?: 1 to 4 times per year    Marital Status: Not on file    MEDICATIONS:  Current Outpatient Medications  Medication Sig Dispense Refill   amLODipine  (NORVASC ) 10 MG tablet TAKE 1 TABLET BY MOUTH ONCE DAILY IN THE MORNING 90 tablet 3   aspirin  EC 81 MG tablet Take 1 tablet (81 mg total) by mouth daily. Swallow whole. 30 tablet 12   atorvastatin  (LIPITOR ) 80 MG tablet Take 80 mg by mouth at bedtime.     BD PEN NEEDLE NANO 2ND GEN 32G X 4 MM MISC USE WITH LANTUS  SOLOSTAR TWICE DAILY     BINAXNOW COVID-19 AG HOME TEST KIT Use as Directed on the Package     busPIRone  (BUSPAR ) 5 MG tablet Take 5 mg by mouth 3 (three) times daily.     Capsaicin 0.025 % LOTN Daily at bedtime. 113.2 mL 3   Carboxymethylcellul-Glycerin (CLEAR EYES FOR DRY EYES OP) Place 1 drop into both eyes daily as needed (dry eyes).     carvedilol  (COREG ) 25 MG tablet Take 1 tablet by mouth twice daily 180 tablet 2   clopidogrel  (PLAVIX ) 75 MG tablet Take 1 tablet (75 mg total) by mouth daily. 30 tablet 3   Continuous Glucose Sensor (DEXCOM G7 SENSOR) MISC 1 Device by Does not apply route continuous. 9 each 3   diclofenac Sodium (VOLTAREN) 1 % GEL Apply 1 Application topically 4 (four) times daily as needed (pain).     diphenhydrAMINE HCl, Sleep, (ZZZQUIL) 50 MG/30ML LIQD Take 50 mg by mouth at bedtime as needed (sleep).     DULoxetine (CYMBALTA) 60 MG capsule Take 60 mg by mouth daily.     empagliflozin  (JARDIANCE ) 25 MG TABS tablet Take 1 tablet (25 mg total) by mouth daily. 90 tablet 3   famotidine (PEPCID) 40 MG tablet Take 40 mg by mouth at bedtime.     ferrous sulfate  325 (65 FE) MG tablet Take 325 mg by mouth 2 (two) times daily.     Garlic (GARLIQUE PO) Take 1 tablet by mouth at bedtime.     insulin  glargine (LANTUS ) 100 UNIT/ML Solostar Pen Inject 45 Units into the skin daily. 30 mL 3   isosorbide  mononitrate (IMDUR ) 30 MG 24 hr tablet Take 1 tablet (30 mg total) by mouth daily. 90 tablet 3   lisinopril   (ZESTRIL ) 40 MG tablet Take 1 tablet by mouth once daily 90 tablet 3   loratadine  (CLARITIN ) 10 MG tablet Take 10 mg by mouth daily.     metFORMIN  (GLUCOPHAGE ) 1000 MG tablet Take 1 tablet (1,000 mg total) by mouth 2 (two) times daily. (Patient taking differently: Take 1,000 mg by mouth daily.) 180 tablet 3   montelukast (SINGULAIR) 10 MG tablet Take 10 mg by mouth at bedtime.     nitroGLYCERIN  (NITROSTAT ) 0.4 MG SL tablet Place 1 tablet (0.4 mg total) under the tongue every 5 (five) minutes as needed for chest pain. 25 tablet 3   Omega-3 Fatty Acids (FISH OIL) 1000 MG CAPS Take 1,000 mg by mouth at bedtime.  pantoprazole  (PROTONIX ) 40 MG tablet Take 40 mg by mouth at bedtime.     spironolactone  (ALDACTONE ) 25 MG tablet Take 1 tablet by mouth once daily 30 tablet 9   tirzepatide  (MOUNJARO ) 15 MG/0.5ML Pen Inject 15 mg into the skin once a week. 6 mL 4   No current facility-administered medications for this visit.   Facility-Administered Medications Ordered in Other Visits  Medication Dose Route Frequency Provider Last Rate Last Admin   insulin  aspart (novoLOG ) injection 0-15 Units  0-15 Units Subcutaneous TID WC de Macedo Rodrigues, Katyucia, MD        PHYSICAL EXAM: Vitals:   10/22/24 1042  BP: 116/70  Pulse: 85  Resp: 16  SpO2: 97%  Weight: 218 lb 12.8 oz (99.2 kg)  Height: 5' 10 (1.778 m)     Body mass index is 31.39 kg/m.  Wt Readings from Last 3 Encounters:  10/22/24 218 lb 12.8 oz (99.2 kg)  08/15/24 214 lb 6.4 oz (97.3 kg)  08/08/24 214 lb 6.4 oz (97.3 kg)    General: Well developed, well nourished male in no apparent distress.  HEENT: AT/[place], no external lesions.  Eyes: Conjunctiva clear and no icterus. Neck: Neck supple  Lungs: Respirations not labored Neurologic: Alert, oriented, normal speech Extremities / Skin: Dry. Psychiatric: Does not appear depressed or anxious  Diabetic Foot Exam - Simple   Simple Foot Form Visual Inspection No deformities, no  ulcerations, no other skin breakdown bilaterally: Yes Sensation Testing Intact to touch and monofilament testing bilaterally: Yes See comments: Yes Pulse Check Posterior Tibialis and Dorsalis pulse intact bilaterally: Yes Comments With bilateral pads on distal soles.    LABS Reviewed Lab Results  Component Value Date   HGBA1C 6.8 (A) 10/22/2024   HGBA1C 6.7 (A) 07/23/2024   HGBA1C 7.3 (A) 04/11/2024   No results found for: FRUCTOSAMINE Lab Results  Component Value Date   CHOL 89 (L) 07/15/2024   HDL 30 (L) 07/15/2024   LDLCALC 26 07/15/2024   TRIG 208 (H) 07/15/2024   CHOLHDL 3.0 07/15/2024   Lab Results  Component Value Date   MICRALBCREAT 44 (H) 12/11/2023   Lab Results  Component Value Date   CREATININE 1.18 07/15/2024   No results found for: GFR  ASSESSMENT / PLAN  1. Type 2 diabetes mellitus with other specified complication, with long-term current use of insulin  (HCC)   2. Neuropathy      Diabetes Mellitus type 2, complicated by diabetic neuropathy/nephropathy/microalbuminuria. - Diabetic status / severity: Fair control.  Lab Results  Component Value Date   HGBA1C 6.8 (A) 10/22/2024    - Hemoglobin A1c goal : <6.5%  Hemoglobin A1c today 6.8%.  He has frequent postprandial hyperglycemia usually mild in 200s range.  Discussed about limiting carbohydrate and portion control.  - Medications: See below.  I) continue Lantus  45 units daily.  II) continue Jardiance  25 mg daily. III stay on metformin  1000 mg 2 times a day.   V) increase Mounjaro  from 12.5 to 15 mg weekly.    - Home glucose testing: Dexcom G7 and check as needed.  - Discussed/ Gave Hypoglycemia treatment plan.  # Consult : not required at this time.   # Annual urine for microalbuminuria/ creatinine ratio, + microalbuminuria currently, continue ACE/ARB /lisinopril  /Jardiance .  Will check today. Last  Lab Results  Component Value Date   MICRALBCREAT 44 (H) 12/11/2023    # Foot  check nightly / neuropathy.  Had tried gabapentin, Lyrica, ? Cymbalta in the past did  not like them.  Try capsaicin cream.  # Annual dilated diabetic eye exams.   - Diet: Make healthy diabetic food choices.  Encouraged to stay on the diet plan. - Life style / activity / exercise: Discussed.  2. Blood pressure  -  BP Readings from Last 1 Encounters:  10/22/24 116/70    - Control is in target.  - No change in current plans.  3. Lipid status / Hyperlipidemia - Last  Lab Results  Component Value Date   LDLCALC 26 07/15/2024   - Continue atorvastatin  80 mg daily.  Managed by PCP.  Diagnoses and all orders for this visit:  Type 2 diabetes mellitus with other specified complication, with long-term current use of insulin  (HCC) -     POCT glycosylated hemoglobin (Hb A1C) -     tirzepatide  (MOUNJARO ) 15 MG/0.5ML Pen; Inject 15 mg into the skin once a week. -     Microalbumin / creatinine urine ratio -     Capsaicin 0.025 % LOTN; Daily at bedtime.  Neuropathy -     Capsaicin 0.025 % LOTN; Daily at bedtime.   DISPOSITION Follow up in clinic in 4 months suggested.   All questions answered and patient verbalized understanding of the plan.  Ayan Yankey, MD [facility] Endocrinology [facility] [address] [place], [address] [place], [place] [address] Phone # [phone]  At least part of this note was generated using voice recognition software. Inadvertent word errors may have occurred, which were not recognized during the proofreading process.

## 2024-10-23 LAB — MICROALBUMIN / CREATININE URINE RATIO
Creatinine, Urine: 183 mg/dL (ref 20–320)
Microalb Creat Ratio: 46 mg/g{creat} — ABNORMAL HIGH (ref <30)
Microalb, Ur: 8.5 mg/dL

## 2024-11-08 ENCOUNTER — Other Ambulatory Visit: Payer: Self-pay | Admitting: Endocrinology

## 2024-11-08 DIAGNOSIS — E1169 Type 2 diabetes mellitus with other specified complication: Secondary | ICD-10-CM

## 2024-11-25 ENCOUNTER — Other Ambulatory Visit: Payer: Self-pay | Admitting: Endocrinology

## 2024-11-25 DIAGNOSIS — E1169 Type 2 diabetes mellitus with other specified complication: Secondary | ICD-10-CM

## 2025-02-25 ENCOUNTER — Ambulatory Visit: Admitting: Endocrinology
